# Patient Record
Sex: Male | Born: 1940
Health system: Southern US, Community
[De-identification: ages and names within clinical notes are randomized; demographics above are authoritative.]

## PROBLEM LIST (undated history)

## (undated) DIAGNOSIS — C61 Malignant neoplasm of prostate: Secondary | ICD-10-CM

## (undated) DIAGNOSIS — Z860100 Personal history of colon polyps, unspecified: Secondary | ICD-10-CM

## (undated) DIAGNOSIS — E785 Hyperlipidemia, unspecified: Secondary | ICD-10-CM

## (undated) DIAGNOSIS — M62838 Other muscle spasm: Secondary | ICD-10-CM

## (undated) DIAGNOSIS — K219 Gastro-esophageal reflux disease without esophagitis: Secondary | ICD-10-CM

## (undated) DIAGNOSIS — M1990 Unspecified osteoarthritis, unspecified site: Secondary | ICD-10-CM

## (undated) DIAGNOSIS — C419 Malignant neoplasm of bone and articular cartilage, unspecified: Secondary | ICD-10-CM

## (undated) DIAGNOSIS — M2550 Pain in unspecified joint: Secondary | ICD-10-CM

## (undated) DIAGNOSIS — K2270 Barrett's esophagus without dysplasia: Secondary | ICD-10-CM

## (undated) DIAGNOSIS — H269 Unspecified cataract: Secondary | ICD-10-CM

## (undated) HISTORY — PX: ESOPHAGOGASTRODUODENOSCOPY: SHX1529

## (undated) HISTORY — PX: COLONOSCOPY: SHX174

## (undated) HISTORY — PX: TOTAL HIP ARTHROPLASTY: SHX124

---

## 2000-10-31 ENCOUNTER — Ambulatory Visit (HOSPITAL_COMMUNITY): Admission: RE | Admit: 2000-10-31 | Discharge: 2000-10-31 | Payer: Self-pay | Admitting: *Deleted

## 2000-10-31 ENCOUNTER — Encounter (INDEPENDENT_AMBULATORY_CARE_PROVIDER_SITE_OTHER): Payer: Self-pay | Admitting: *Deleted

## 2004-03-04 ENCOUNTER — Ambulatory Visit (HOSPITAL_COMMUNITY): Admission: RE | Admit: 2004-03-04 | Discharge: 2004-03-04 | Payer: Self-pay | Admitting: *Deleted

## 2004-03-04 ENCOUNTER — Encounter (INDEPENDENT_AMBULATORY_CARE_PROVIDER_SITE_OTHER): Payer: Self-pay | Admitting: Specialist

## 2005-04-26 ENCOUNTER — Ambulatory Visit (HOSPITAL_COMMUNITY): Admission: RE | Admit: 2005-04-26 | Discharge: 2005-04-26 | Payer: Self-pay | Admitting: *Deleted

## 2005-04-26 ENCOUNTER — Encounter (INDEPENDENT_AMBULATORY_CARE_PROVIDER_SITE_OTHER): Payer: Self-pay | Admitting: Specialist

## 2005-12-08 ENCOUNTER — Encounter: Admission: RE | Admit: 2005-12-08 | Discharge: 2005-12-08 | Payer: Self-pay | Admitting: Internal Medicine

## 2006-01-23 ENCOUNTER — Encounter: Admission: RE | Admit: 2006-01-23 | Discharge: 2006-01-23 | Payer: Self-pay | Admitting: Orthopedic Surgery

## 2006-07-27 ENCOUNTER — Encounter: Admission: RE | Admit: 2006-07-27 | Discharge: 2006-07-27 | Payer: Self-pay | Admitting: Internal Medicine

## 2006-12-13 ENCOUNTER — Encounter: Admission: RE | Admit: 2006-12-13 | Discharge: 2006-12-13 | Payer: Self-pay | Admitting: Internal Medicine

## 2006-12-27 ENCOUNTER — Ambulatory Visit: Admission: RE | Admit: 2006-12-27 | Discharge: 2006-12-27 | Payer: Self-pay | Admitting: Internal Medicine

## 2007-03-06 ENCOUNTER — Inpatient Hospital Stay (HOSPITAL_COMMUNITY): Admission: RE | Admit: 2007-03-06 | Discharge: 2007-03-10 | Payer: Self-pay | Admitting: Orthopedic Surgery

## 2007-06-28 ENCOUNTER — Ambulatory Visit (HOSPITAL_COMMUNITY): Admission: RE | Admit: 2007-06-28 | Discharge: 2007-06-28 | Payer: Self-pay | Admitting: *Deleted

## 2007-06-28 ENCOUNTER — Encounter (INDEPENDENT_AMBULATORY_CARE_PROVIDER_SITE_OTHER): Payer: Self-pay | Admitting: *Deleted

## 2008-08-18 ENCOUNTER — Encounter (INDEPENDENT_AMBULATORY_CARE_PROVIDER_SITE_OTHER): Payer: Self-pay | Admitting: *Deleted

## 2008-08-18 ENCOUNTER — Ambulatory Visit (HOSPITAL_COMMUNITY): Admission: RE | Admit: 2008-08-18 | Discharge: 2008-08-18 | Payer: Self-pay | Admitting: *Deleted

## 2009-04-30 ENCOUNTER — Ambulatory Visit (HOSPITAL_COMMUNITY): Admission: RE | Admit: 2009-04-30 | Discharge: 2009-04-30 | Payer: Self-pay | Admitting: *Deleted

## 2009-12-19 HISTORY — PX: KNEE ARTHROSCOPY: SHX127

## 2010-09-09 ENCOUNTER — Emergency Department (HOSPITAL_COMMUNITY): Admission: AC | Admit: 2010-09-09 | Discharge: 2010-09-09 | Payer: Self-pay

## 2011-01-09 ENCOUNTER — Encounter: Payer: Self-pay | Admitting: Internal Medicine

## 2011-03-03 LAB — CBC
Hemoglobin: 16.4 g/dL (ref 13.0–17.0)
MCH: 33.3 pg (ref 26.0–34.0)
MCV: 93.7 fL (ref 78.0–100.0)
Platelets: 274 10*3/uL (ref 150–400)
RBC: 4.92 MIL/uL (ref 4.22–5.81)
WBC: 6.5 10*3/uL (ref 4.0–10.5)

## 2011-03-03 LAB — DIFFERENTIAL
Eosinophils Absolute: 0.1 10*3/uL (ref 0.0–0.7)
Lymphocytes Relative: 29 % (ref 12–46)
Lymphs Abs: 1.9 10*3/uL (ref 0.7–4.0)
Monocytes Relative: 10 % (ref 3–12)
Neutrophils Relative %: 59 % (ref 43–77)

## 2011-03-03 LAB — BASIC METABOLIC PANEL
CO2: 23 meq/L (ref 19–32)
Calcium: 9.8 mg/dL (ref 8.4–10.5)
Chloride: 104 meq/L (ref 96–112)
GFR calc Af Amer: >60 mL/min (ref >60)
Sodium: 137 meq/L (ref 135–145)

## 2011-03-03 LAB — POCT I-STAT, CHEM 8
BUN: 16 mg/dL (ref 6–23)
Chloride: 106 meq/L (ref 96–112)
Creatinine, Ser: 1 mg/dL (ref 0.4–1.5)
Glucose, Bld: 100 mg/dL — ABNORMAL HIGH (ref 70–99)
Hemoglobin: 16.7 g/dL (ref 13.0–17.0)
Potassium: 4.4 meq/L (ref 3.5–5.1)
Sodium: 137 meq/L (ref 135–145)

## 2011-03-03 LAB — GLUCOSE, CAPILLARY

## 2011-05-03 NOTE — Op Note (Signed)
NAMELAWTON, DOLLINGER NO.:  [id number]   MEDICAL RECORD NO.:  [id number]          PATIENT TYPE:  AMB   LOCATION:  ENDO                         FACILITY:  [facility]   PHYSICIAN:  Georgiana Spinner, M.D.    DATE OF BIRTH:  01-10-1941   DATE OF PROCEDURE:  06/28/2007  DATE OF DISCHARGE:                               OPERATIVE REPORT   PROCEDURE:  Upper endoscopy.   INDICATIONS:  Barrett's esophagus.   ANESTHESIA:  Fentanyl 75 mcg, Versed 7 mg.   DESCRIPTION OF PROCEDURE:  With the patient mildly sedated in the left  lateral decubitus position, the Pentax videoscopic endoscope was  inserted in the mouth and passed under direct vision through the  esophagus which appeared normal until we reached the distal esophagus  and there was an area of Barrett's esophagus photographed and biopsied.  We entered into the stomach.  The fundus, body, antrum, duodenal bulb,  and second portion of the duodenum all appeared normal.  From this  point, the endoscope was slowly withdrawn taking circumferential views  of the duodenal mucosa until the endoscope had been pulled back into the  stomach and placed in retroflexion to view the stomach from below. The  endoscope was straightened and withdrawn, taking circumferential views  of the remaining gastric and esophageal mucosa.  [name] vital  signs and pulse oximeter remained stable.  The patient tolerated the  procedure well without apparent complications.   FINDINGS:  Changes of Barrett's esophagus, biopsied, await biopsy  report.  The patient will call me for results and follow up with me as  needed as an outpatient.           ______________________________  Georgiana Spinner, M.D.     GMO/MEDQ  D:  06/28/2007  T:  06/28/2007  Job:  [id number]

## 2011-05-03 NOTE — Op Note (Signed)
NAMELESS, WOOLSEY NO.:  [id number]   MEDICAL RECORD NO.:  [id number]          PATIENT TYPE:  AMB   LOCATION:  ENDO                         FACILITY:  [facility]   PHYSICIAN:  Georgiana Spinner, M.D.    DATE OF BIRTH:  Mar 16, 1941   DATE OF PROCEDURE:  08/18/2008  DATE OF DISCHARGE:                               OPERATIVE REPORT   PROCEDURE:  Upper endoscopy with biopsy.   INDICATIONS:  Barrett's esophagus.   ANESTHESIA:  Fentanyl 50 mcg, Versed 6 mg.   PROCEDURE:  With the patient mildly sedated in the left lateral  decubitus position, the Pentax videoscopic endoscope was inserted in the  mouth, passed under direct vision through the esophagus which appeared  normal on initial view as we entered into the stomach.  Fundus, body,  antrum, duodenal bulb, and second portion duodenum were visualized.  All  appeared normal.  From this point, the endoscope was slowly withdrawn  taking circumferential views of duodenal mucosa until the endoscope had  been pulled back into stomach and placed in retroflexion to view the  stomach from below.  The endoscope was straightened and withdrawn taking  circumferential views of the remaining gastric and esophageal mucosa  stopping in the distal esophagus where an island of Barrett's was  photographed and biopsied.  [name] vital signs and pulse oximeter  remained stable.  The patient tolerated the procedure well without  apparent complications.   FINDINGS:  One [address] of Barrett's esophagus biopsied.  Await  biopsy report.  The patient will call me for results and follow up with  me as an outpatient.           ______________________________  Georgiana Spinner, M.D.     GMO/MEDQ  D:  08/18/2008  T:  08/18/2008  Job:  [id number]

## 2011-05-03 NOTE — Op Note (Signed)
NAMEADREYAN, [name] NO.:  [id number]   MEDICAL RECORD NO.:  [id number]          PATIENT TYPE:  AMB   LOCATION:  ENDO                         FACILITY:  [facility]   PHYSICIAN:  Georgiana Spinner, M.D.    DATE OF BIRTH:  06/04/1941   DATE OF PROCEDURE:  DATE OF DISCHARGE:                               OPERATIVE REPORT   PROCEDURE:  Colonoscopy.   INDICATIONS:  Colon cancer screening for history of colon polyps.   ANESTHESIA:  Fentanyl 60 mcg, Versed 6 mg.   PROCEDURE:  With the patient mildly sedated in the left lateral  decubitus position, a rectal exam was performed which was unremarkable.  Subsequently, the Pentax videoscopic pediatric colonoscope was inserted  in the rectum and passed under direct vision to the cecum, identified by  the ileocecal valve and appendiceal orifice, both of which were  photographed.  From this point, the colonoscope was slowly withdrawn  taking circumferential views of colonic mucosa, stopping only in the  rectum which appeared normal on direct and showed hemorrhoids on  retroflexed view.  The endoscope was straightened and withdrawn.  The  patient's vital signs and pulse oximeter remained stable.  The patient  tolerated the procedure well without apparent complication.   FINDINGS:  Internal hemorrhoids, rare diverticulum sigmoid colon,  otherwise an unremarkable exam.   PLAN:  Consider repeat examination in 5 years.           ______________________________  Georgiana Spinner, M.D.     GMO/MEDQ  D:  04/30/2009  T:  04/30/2009  Job:  [id number]

## 2011-05-06 NOTE — Procedures (Signed)
[place]  Patient:    [name], [name]                    MRN: [id number] Proc. Date: 10/31/00 Adm. Date:  [id number] Attending:  Sabino Gasser                           Procedure Report  PROCEDURE PERFORMED:  Colonoscopy with biopsy.  ENDOSCOPIST:  Sabino Gasser, M.D.  INDICATIONS FOR PROCEDURE:  Colon polyps, family history of colon cancer.  ANESTHESIA:  Demerol 30 mg, Versed 3 mg.  DESCRIPTION OF PROCEDURE:  With the patient mildly sedated in the left lateral decubitus position, the Olympus videoscopic colonoscope was inserted in the rectum after rectal examination was performed and passed under direct vision into the cecum.  The cecum was identified by the ileocecal valve and appendiceal orifice.  We entered into the ileocecal valve and viewed the terminal ileum which also appeared normal and was photographed.  From this point, the colonoscope was slowly withdrawn, taking circumferential views of the entire colonic mucosa stopping only in the rectum which appeared grossly normal on direct view and showed internal hemorrhoids on retroflex.  As we flipped the endoscope out of retroflex view, a polyp was seen just above the anal verge.  This was photographed and then removed using hot biopsy forceps technique at a setting of 20/20 blended current.  Patients vital signs and pulse oximeter remained stable.  The patient tolerated the procedure well and without apparent complications.  FINDINGS:  Polyp in rectum await biopsy report.  Patient will call me for results and follow up with me as an outpatient as needed. DD:  10/31/00 TD:  10/31/00 Job: [id number]

## 2011-05-06 NOTE — Op Note (Signed)
[name], [name]                       ACCOUNT NO.:  [id number]   MEDICAL RECORD NO.:  [id number]                   PATIENT TYPE:  AMB   LOCATION:  ENDO                                 FACILITY:  [facility]   PHYSICIAN:  Georgiana Spinner, M.D.                 DATE OF BIRTH:  [birth year]   DATE OF PROCEDURE:  03/04/2004  DATE OF DISCHARGE:  03/04/2004                                 OPERATIVE REPORT   PROCEDURE:  Colonoscopy.   ENDOSCOPIST:  Georgiana Spinner, M.D.   INDICATIONS:  Colon polyps.   ANESTHESIA:  Demerol 25 mg, Versed 4 mg.   DESCRIPTION OF PROCEDURE:  With the patient mildly sedated in the left  lateral decubitus position, the Olympus videoscopic colonoscope was inserted  in the rectum  after normal rectal exam and passed under direct vision to  the cecum, identified by ileocecal valve and base of the cecum, both of  which were photographed.  Just adjacent to the valve, there was an area of  fold that appeared to be slightly thickened, and I just took a biopsy of it.  I was not sure whether it was adenomatous and actually thought it probably  was not but was perfectly ________ so I biopsied it and then removed the  colonoscope taking circumferential views of the remaining colonic mucosa  stopping only in the rectum which appeared normal on direct and retroflexed  view.  The endoscope was straightened and withdrawn.  [name] vital  signs and pulse oximeter remained stable.  The patient tolerated the  procedure well without apparent complications.   FINDINGS:  Question of a polyp in the cecum.  Otherwise an unremarkable  examination.   PLAN:  We will await biopsy report.  The patient will call me for results  and follow up with me as an outpatient.  See endoscopy note for further  details.  If this tissue is adenomatous, we will repeat colonoscopy with  planned removal.  If not, we will plan on repeat examination in possibly  five years and three years if it proves  to be adenomatous.                                               Georgiana Spinner, M.D.    GMO/MEDQ  D:  03/04/2004  T:  03/06/2004  Job:  [id number]

## 2011-05-06 NOTE — Op Note (Signed)
[name], [name]                       ACCOUNT NO.:  [id number]   MEDICAL RECORD NO.:  [id number]                   PATIENT TYPE:  AMB   LOCATION:  ENDO                                 FACILITY:  [facility]   PHYSICIAN:  Georgiana Spinner, M.D.                 DATE OF BIRTH:  [birth year]   DATE OF PROCEDURE:  03/04/2004  DATE OF DISCHARGE:  03/04/2004                                 OPERATIVE REPORT   PROCEDURE:  Upper endoscopy.   ENDOSCOPIST:  Georgiana Spinner, M.D.   INDICATIONS:  GERD.   ANESTHESIA:  Demerol 75 mg, Versed 4 mg.   DESCRIPTION OF PROCEDURE:  With the patient mildly sedated in the left  lateral decubitus position, the Olympus videoscopic endoscope was inserted  into the mouth, passed under direct vision through the esophagus. There was  a question of Barrett's, photographed and biopsied.  We entered the stomach.  The fundus, body, antrum, duodenal bulb, second portion of the duodenum  appeared normal.  From this point, the endoscope was slowly withdrawn taking  circumferential views of the duodenal mucosa until the endoscope was pulled  back into the stomach, placed in retroflexion to view the stomach from  below.  The endoscope was then straightened and withdrawn taking  circumferential views of the remaining gastric and esophageal mucosa.  The  patient's vital signs and pulse oximeter remained stable.  The patient  tolerated the procedure well without apparent complications.   FINDINGS:  Question of Barrett's esophagus, biopsied.   PLAN:  1. Await biopsy report.  The patient will call me for results and follow up     with me as an outpatient.  2. Proceed to colonoscopy as planned.                                               Georgiana Spinner, M.D.    GMO/MEDQ  D:  03/04/2004  T:  03/06/2004  Job:  [id number]

## 2011-05-06 NOTE — Op Note (Signed)
[name], [name] NO.:  [id number]   MEDICAL RECORD NO.:  [id number]          PATIENT TYPE:  AMB   LOCATION:  ENDO                         FACILITY:  [facility]   PHYSICIAN:  Georgiana Spinner, M.D.    DATE OF BIRTH:  Jan 24, 1941   DATE OF PROCEDURE:  DATE OF DISCHARGE:                                 OPERATIVE REPORT   PROCEDURE:  Upper endoscopy.   INDICATIONS:  GERD.   ANESTHESIA:  Demerol 40 mg, Versed 4 mg.   DESCRIPTION OF PROCEDURE:  With the patient mildly sedated in the left  lateral decubitus position, the Olympus videoscopic endoscope was inserted  into the mouth and passed under direct vision through the esophagus which  appeared normal until we reached the distal esophagus and there appeared to  be once again evidence of Barrett's which was photographed and biopsied.  The endoscope was advanced into the stomach and the fundus, body, antrum,  duodenal bulb, and second portion of the duodenum were visualized.  From  this point, the endoscope was slowly withdrawn, taking circumferential views  of the duodenal mucosa until the endoscope was pulled back into the stomach  and placed in retroflexion.  The endoscope was straightened and withdrawn,  taking circumferential views of the remaining gastric and esophageal mucosa.  [name] vital signs and pulse oximetry remained stable and the patient  tolerated the procedure well without apparent complication.   FINDINGS:  Barrett's esophagus, biopsied.   PLAN:  Await biopsy results, the patient will call for results and follow up  with me as an outpatient.      GMO/MEDQ  D:  04/26/2005  T:  04/26/2005  Job:  [id number]

## 2011-05-06 NOTE — Discharge Summary (Signed)
NAMEXZAVIAN, SEMMEL NO.:  [id number]   MEDICAL RECORD NO.:  [id number]          PATIENT TYPE:  INP   LOCATION:  5021                         FACILITY:  [facility]   PHYSICIAN:  Burnard Bunting, M.D.    DATE OF BIRTH:  [birth year]   DATE OF ADMISSION:  03/06/2007  DATE OF DISCHARGE:  03/10/2007                               DISCHARGE SUMMARY   DISCHARGE DIAGNOSIS:  Right hip arthritis.   SECONDARY DIAGNOSIS:  None.   OPERATIVE PROCEDURES:  Right total hip replacement March 06, 2007.   HOSPITAL COURSE:  [name] is a 70 year old patient with endstage  right hip arthritis.  He underwent right total hip replacement March 06, 2007.  He tolerated the procedure well and without having any  complications.   Postoperatively, leg lengths were approximately equal.  Hemoglobin was  12.  On postop day #1, he was started with mobilization from physical  therapy.  Weightbearing as tolerated, as well as Coumadin for DVT  prophylaxis.  Pain was well controlled on postop day #2.   INR was brought into the therapeutic range by postop day #3.  Incision  was intact.  On postop day #3, dressing was changed at that time.  The  patient had otherwise unremarkable recovery.  He was stable and safe  with PT prior to discharge.  He was discharged home in good condition  March 10, 2007.   DISCHARGE MEDICATIONS:  1. Include Percocet 1 to 2 p.o. q. 3 to 4 hours p.r.n. pain.  2. Robaxin as a muscle relaxor 500 mg p.o. q. 6 to 8 hours p.r.n.      spasms.  3. Coumadin 5 mg p.o. daily to INR 2 to 3.5.  4. Welchol 625 mg daily.  5. Zetia 10 mg p.o. daily.  6. Pravachol 30 mg daily.   He will follow up with me in 7 days for suture removal.      Burnard Bunting, M.D.  Electronically Signed     GSD/MEDQ  D:  04/18/2007  T:  04/18/2007  Job:  [id number]

## 2011-05-06 NOTE — Op Note (Signed)
NAMEDANTHONY, [name] NO.:  [id number]   MEDICAL RECORD NO.:  [id number]          PATIENT TYPE:  INP   LOCATION:  5021                         FACILITY:  [facility]   PHYSICIAN:  Burnard Bunting, M.D.    DATE OF BIRTH:  [birth year]   DATE OF PROCEDURE:  03/06/2007  DATE OF DISCHARGE:                               OPERATIVE REPORT   PREOPERATIVE DIAGNOSES:  Right hip arthritis.   POSTOPERATIVE DIAGNOSES:  Right hip arthritis.   PROCEDURE:  Right total hip arthroplasty using S-ROM press fit  components, 54 acetabular cup, 20 F small proximal sleeve, plus 6 neck,  20 x 15 x 165, 38 standard neck plus a lateralized stem and a 47 ASR  head.   SURGEON:  Burnard Bunting, M.D.   ANESTHESIA:  General endotracheal.   ESTIMATED BLOOD LOSS:  150.   INDICATIONS:  The patient is a 70 year old patient with end-stage right  hip arthritis who presents for right total hip replacement.  He had  failed nonoperative management and after explanation of the risks and  benefits, agrees to proceed.   PROCEDURE IN DETAIL:  The patient was brought to the operating room  where general endotracheal anesthesia was induced and preoperative  antibiotics were administered.  The patient was placed in the lateral  decubitus position with the left axilla and left peroneal nerve well  padded.  [name] right hip, leg and foot were prepped with  DuraPrep solution and draped in a sterile manner.  The skin and  subcutaneous tissue were sharply divided.  The fascia lata was  encountered and divided over the trochanter.  The sciatic nerve was  palpated at this time and protected at all times during the remaining  portion of the case.  The piriformis tendon was tagged and cut and  detached along with the other external rotators.  The capsule was T'd  and marked with #1 Ethibond suture.  The head was dislocated.  The  femoral neck cut was then performed with the reciprocating saw in  accordance with  preoperative templating and intraoperative templating  with a trial prosthesis.  Canal was then reamed in a lateralized  fashion.  The retractors were then placed and the cup was reamed at  approximately 45 degrees abduction and 20 degrees of anteversion, a 54  press-fit cup was placed with excellent fixation achieved.  The final  preparation of the femoral stem was performed with the proximal sleeve  reamer.  Trial prosthesis was placed with anteversion dialed into  position approximately 30-40 degrees off eversion which was  approximately yet 30 degrees of version.  With the trial prosthesis in  place, the patient had full extension and external rotation without any  tearing instability, stability in the position of sleep as well as  stability with 10 degrees of adduction, 90 degrees of hip flexion and 70  degrees of internal rotation.  At this time, trial femoral components  were removed.  True components were placed and stability parameters were  maintained.  Intraoperative x-rays demonstrated proximal and equal leg  length.  The sciatic nerve was  again palpated at the conclusion of the  case.  The split capsule was repaired using a #1 Vicryl suture.  Piriformis tendon was tagged back to the capsule using #1 Vicryl suture.  The fascia lata was closed using interrupted inverted #1 Vicryl suture  followed by interrupted #0 Vicryl suture, 2-0 Vicryl suture and skin  staples and  impervious dressing was placed.  The patient tolerated the procedure  well without immediate complication.  It should be noted that Dr. Gentry Roch assistance for the entire case was required for retraction of  endovascular structures and safe location and dislocation of the femoral  head and prosthesis.      Burnard Bunting, M.D.  Electronically Signed     GSD/MEDQ  D:  03/06/2007  T:  03/06/2007  Job:  [id number]

## 2013-04-17 ENCOUNTER — Other Ambulatory Visit (HOSPITAL_COMMUNITY): Payer: Self-pay | Admitting: Orthopaedic Surgery

## 2013-04-24 ENCOUNTER — Encounter (HOSPITAL_COMMUNITY): Payer: Self-pay | Admitting: Pharmacy Technician

## 2013-04-25 NOTE — Pre-Procedure Instructions (Signed)
DEPAUL ARIZPE  04/25/2013   Your procedure is scheduled on:  May 07, 2013  Report to [facility] at 10:45 AM.  Call this number if you have problems the morning of surgery: [phone]   Remember:   Do not eat food or drink liquids after midnight.   Take these medicines the morning of surgery with A SIP OF WATER: pantoprazole (PROTONIX)    Do not wear jewelry  Do not wear lotions, powders, or perfumes. You may wear deodorant.  Do not shave 48 hours prior to surgery. Men may shave face and neck.  Do not bring valuables to the hospital.  Contacts, dentures or bridgework may not be worn into surgery.  Leave suitcase in the car. After surgery it may be brought to your room.  For patients admitted to the hospital, checkout time is 11:00 AM the day of  discharge.   Patients discharged the day of surgery will not be allowed to drive  home.  Name and phone number of your driver:   Special Instructions: Shower using CHG 2 nights before surgery and the night before surgery.  If you shower the day of surgery use CHG.  Use special wash - you have one bottle of CHG for all showers.  You should use approximately 1/3 of the bottle for each shower.   Please read over the following fact sheets that you were given: Pain Booklet, Coughing and Deep Breathing, Blood Transfusion Information, Total Joint Packet and Surgical Site Infection Prevention

## 2013-04-26 ENCOUNTER — Encounter (HOSPITAL_COMMUNITY)
Admission: RE | Admit: 2013-04-26 | Discharge: 2013-04-26 | Disposition: A | Discharge status: Home / Self Care | Payer: Managed Care, Other (non HMO) | Source: Ambulatory Visit | Attending: Orthopaedic Surgery | Admitting: Orthopaedic Surgery

## 2013-04-26 ENCOUNTER — Encounter (HOSPITAL_COMMUNITY): Payer: Self-pay

## 2013-04-26 HISTORY — DX: Unspecified osteoarthritis, unspecified site: M19.90

## 2013-04-26 HISTORY — DX: Barrett's esophagus without dysplasia: K22.70

## 2013-04-26 HISTORY — DX: Gastro-esophageal reflux disease without esophagitis: K21.9

## 2013-04-26 LAB — BASIC METABOLIC PANEL
BUN: 18 mg/dL (ref 6–23)
CO2: 26 mEq/L (ref 19–32)
Calcium: 10 mg/dL (ref 8.4–10.5)
Chloride: 104 mEq/L (ref 96–112)
Creatinine, Ser: 1.03 mg/dL (ref 0.50–1.35)
GFR calc Af Amer: 83 mL/min — ABNORMAL LOW (ref >90)

## 2013-04-26 LAB — CBC
HCT: 44.5 % (ref 39.0–52.0)
MCHC: 35.1 g/dL (ref 30.0–36.0)
MCV: 91.6 fL (ref 78.0–100.0)
Platelets: 237 10*3/uL (ref 150–400)
RDW: 12.8 % (ref 11.5–15.5)
WBC: 6.3 10*3/uL (ref 4.0–10.5)

## 2013-04-26 LAB — URINALYSIS, ROUTINE W REFLEX MICROSCOPIC
Bilirubin Urine: NEGATIVE
Nitrite: NEGATIVE
Protein, ur: NEGATIVE mg/dL
Specific Gravity, Urine: 1.008 (ref 1.005–1.030)
Urobilinogen, UA: 0.2 mg/dL (ref 0.0–1.0)

## 2013-04-26 LAB — URINE MICROSCOPIC-ADD ON

## 2013-04-26 LAB — SURGICAL PCR SCREEN
MRSA, PCR: NEGATIVE
Staphylococcus aureus: NEGATIVE

## 2013-04-26 LAB — TYPE AND SCREEN: Antibody Screen: NEGATIVE

## 2013-04-29 NOTE — Progress Notes (Signed)
This is a 72 year old male who is scheduled for a left hip total athroplasty to be performed by Dr. Doneen Poisson on 07 May 2013. A pre-operative chest x-ray was performed on 26 Apr 2013. IMPRESSION: No acute abnormality. However, there was a questionable prominent nipple shadow on the left. The recommendation was for a repeat PA view with nipple marker placement to exclude underlying nodule. A repeat CXRwith nipple marker has been ordered for DOS.

## 2013-05-06 MED ORDER — CEFAZOLIN SODIUM-DEXTROSE 2-3 GM-% IV SOLR
2.0000 g | INTRAVENOUS | Status: AC
  Administered 2013-05-07: 2 g via INTRAVENOUS
  Filled 2013-05-06: qty 50

## 2013-05-06 NOTE — Progress Notes (Signed)
Pt notified of time change;to arrive at 1030 

## 2013-05-07 ENCOUNTER — Inpatient Hospital Stay (HOSPITAL_COMMUNITY): Payer: Managed Care, Other (non HMO)

## 2013-05-07 ENCOUNTER — Encounter (HOSPITAL_COMMUNITY): Payer: Self-pay | Admitting: Anesthesiology

## 2013-05-07 ENCOUNTER — Inpatient Hospital Stay (HOSPITAL_COMMUNITY)
Admission: RE | Admit: 2013-05-07 | Discharge: 2013-05-10 | DRG: 470 | Disposition: A | Discharge status: Home / Self Care | Payer: Managed Care, Other (non HMO) | Source: Ambulatory Visit | Attending: Orthopaedic Surgery | Admitting: Orthopaedic Surgery

## 2013-05-07 ENCOUNTER — Encounter (HOSPITAL_COMMUNITY)
Admission: RE | Disposition: A | Discharge status: Home / Self Care | Payer: Self-pay | Source: Ambulatory Visit | Attending: Orthopaedic Surgery

## 2013-05-07 ENCOUNTER — Inpatient Hospital Stay (HOSPITAL_COMMUNITY): Payer: Managed Care, Other (non HMO) | Admitting: Anesthesiology

## 2013-05-07 DIAGNOSIS — M169 Osteoarthritis of hip, unspecified: Secondary | ICD-10-CM

## 2013-05-07 DIAGNOSIS — J449 Chronic obstructive pulmonary disease, unspecified: Secondary | ICD-10-CM | POA: Diagnosis present

## 2013-05-07 DIAGNOSIS — M1610 Unilateral primary osteoarthritis, unspecified hip: Principal | ICD-10-CM | POA: Diagnosis present

## 2013-05-07 DIAGNOSIS — Z01810 Encounter for preprocedural cardiovascular examination: Secondary | ICD-10-CM

## 2013-05-07 DIAGNOSIS — Z96649 Presence of unspecified artificial hip joint: Secondary | ICD-10-CM

## 2013-05-07 DIAGNOSIS — K219 Gastro-esophageal reflux disease without esophagitis: Secondary | ICD-10-CM | POA: Diagnosis present

## 2013-05-07 DIAGNOSIS — Z01818 Encounter for other preprocedural examination: Secondary | ICD-10-CM

## 2013-05-07 DIAGNOSIS — Z01812 Encounter for preprocedural laboratory examination: Secondary | ICD-10-CM

## 2013-05-07 DIAGNOSIS — T84030A Mechanical loosening of internal right hip prosthetic joint, initial encounter: Secondary | ICD-10-CM

## 2013-05-07 DIAGNOSIS — E871 Hypo-osmolality and hyponatremia: Secondary | ICD-10-CM | POA: Diagnosis not present

## 2013-05-07 DIAGNOSIS — J4489 Other specified chronic obstructive pulmonary disease: Secondary | ICD-10-CM | POA: Diagnosis present

## 2013-05-07 HISTORY — PX: TOTAL HIP ARTHROPLASTY: SHX124

## 2013-05-07 SURGERY — ARTHROPLASTY, HIP, TOTAL, ANTERIOR APPROACH
Anesthesia: General | Site: Hip | Laterality: Left | Wound class: Clean

## 2013-05-07 MED ORDER — METOCLOPRAMIDE HCL 10 MG PO TABS: 5.0000 mg | ORAL_TABLET | Freq: Three times a day (TID) | ORAL | Status: DC | PRN

## 2013-05-07 MED ORDER — CEFAZOLIN SODIUM 1-5 GM-% IV SOLN
1.0000 g | Freq: Four times a day (QID) | INTRAVENOUS | Status: AC
  Administered 2013-05-07 – 2013-05-08 (×2): 1 g via INTRAVENOUS
  Filled 2013-05-07 (×2): qty 50

## 2013-05-07 MED ORDER — HYDROMORPHONE HCL PF 1 MG/ML IJ SOLN
INTRAMUSCULAR | Status: DC | PRN
  Administered 2013-05-07: 1 mg via INTRAVENOUS

## 2013-05-07 MED ORDER — ACETAMINOPHEN 10 MG/ML IV SOLN
INTRAVENOUS | Status: AC
  Administered 2013-05-07: 1000 mg via INTRAVENOUS
  Filled 2013-05-07: qty 100

## 2013-05-07 MED ORDER — ACETAMINOPHEN 650 MG RE SUPP: 650.0000 mg | Freq: Four times a day (QID) | RECTAL | Status: DC | PRN

## 2013-05-07 MED ORDER — OXYCODONE HCL 5 MG/5ML PO SOLN: 5.0000 mg | Freq: Once | ORAL | Status: DC | PRN

## 2013-05-07 MED ORDER — LACTATED RINGERS IV SOLN
INTRAVENOUS | Status: DC | PRN
  Administered 2013-05-07 (×3): via INTRAVENOUS

## 2013-05-07 MED ORDER — OXYCODONE HCL 5 MG PO TABS
5.0000 mg | ORAL_TABLET | ORAL | Status: DC | PRN
  Administered 2013-05-08 – 2013-05-10 (×10): 10 mg via ORAL
  Filled 2013-05-07 (×10): qty 2

## 2013-05-07 MED ORDER — DIPHENHYDRAMINE HCL 12.5 MG/5ML PO ELIX: 12.5000 mg | ORAL_SOLUTION | ORAL | Status: DC | PRN

## 2013-05-07 MED ORDER — ACETAMINOPHEN 325 MG PO TABS
650.0000 mg | ORAL_TABLET | Freq: Four times a day (QID) | ORAL | Status: DC | PRN
  Filled 2013-05-07: qty 2

## 2013-05-07 MED ORDER — METOCLOPRAMIDE HCL 5 MG/ML IJ SOLN: 5.0000 mg | Freq: Three times a day (TID) | INTRAMUSCULAR | Status: DC | PRN

## 2013-05-07 MED ORDER — FENTANYL CITRATE 0.05 MG/ML IJ SOLN
INTRAMUSCULAR | Status: DC | PRN
  Administered 2013-05-07: 200 ug via INTRAVENOUS
  Administered 2013-05-07: 250 ug via INTRAVENOUS
  Administered 2013-05-07: 50 ug via INTRAVENOUS

## 2013-05-07 MED ORDER — ONDANSETRON HCL 4 MG/2ML IJ SOLN: 4.0000 mg | Freq: Four times a day (QID) | INTRAMUSCULAR | Status: DC | PRN

## 2013-05-07 MED ORDER — HYDROMORPHONE HCL PF 1 MG/ML IJ SOLN: 1.0000 mg | INTRAMUSCULAR | Status: DC | PRN

## 2013-05-07 MED ORDER — MIDAZOLAM HCL 5 MG/5ML IJ SOLN
INTRAMUSCULAR | Status: DC | PRN
  Administered 2013-05-07: 1 mg via INTRAVENOUS

## 2013-05-07 MED ORDER — ROCURONIUM BROMIDE 100 MG/10ML IV SOLN
INTRAVENOUS | Status: DC | PRN
  Administered 2013-05-07: 40 mg via INTRAVENOUS
  Administered 2013-05-07 (×3): 10 mg via INTRAVENOUS

## 2013-05-07 MED ORDER — MENTHOL 3 MG MT LOZG: 1.0000 | LOZENGE | OROMUCOSAL | Status: DC | PRN

## 2013-05-07 MED ORDER — FENTANYL CITRATE 0.05 MG/ML IJ SOLN
25.0000 ug | INTRAMUSCULAR | Status: DC | PRN
  Administered 2013-05-07: 50 ug via INTRAVENOUS
  Administered 2013-05-07 (×2): 25 ug via INTRAVENOUS

## 2013-05-07 MED ORDER — FENTANYL CITRATE 0.05 MG/ML IJ SOLN
INTRAMUSCULAR | Status: AC
  Filled 2013-05-07: qty 4

## 2013-05-07 MED ORDER — BISACODYL 10 MG RE SUPP: 10.0000 mg | Freq: Every day | RECTAL | Status: DC | PRN

## 2013-05-07 MED ORDER — ONDANSETRON HCL 4 MG/2ML IJ SOLN
INTRAMUSCULAR | Status: DC | PRN
  Administered 2013-05-07: 4 mg via INTRAVENOUS

## 2013-05-07 MED ORDER — METHOCARBAMOL 100 MG/ML IJ SOLN
500.0000 mg | Freq: Four times a day (QID) | INTRAVENOUS | Status: DC | PRN
  Filled 2013-05-07: qty 5

## 2013-05-07 MED ORDER — 0.9 % SODIUM CHLORIDE (POUR BTL) OPTIME
TOPICAL | Status: DC | PRN
  Administered 2013-05-07: 4000 mL

## 2013-05-07 MED ORDER — OXYCODONE HCL ER 10 MG PO T12A
10.0000 mg | EXTENDED_RELEASE_TABLET | Freq: Two times a day (BID) | ORAL | Status: DC
  Administered 2013-05-07 – 2013-05-10 (×5): 10 mg via ORAL
  Filled 2013-05-07 (×5): qty 1

## 2013-05-07 MED ORDER — ONDANSETRON HCL 4 MG PO TABS: 4.0000 mg | ORAL_TABLET | Freq: Four times a day (QID) | ORAL | Status: DC | PRN

## 2013-05-07 MED ORDER — PHENOL 1.4 % MT LIQD: 1.0000 | OROMUCOSAL | Status: DC | PRN

## 2013-05-07 MED ORDER — PROPOFOL 10 MG/ML IV BOLUS
INTRAVENOUS | Status: DC | PRN
  Administered 2013-05-07: 130 mg via INTRAVENOUS

## 2013-05-07 MED ORDER — ALUM & MAG HYDROXIDE-SIMETH 200-200-20 MG/5ML PO SUSP: 30.0000 mL | ORAL | Status: DC | PRN

## 2013-05-07 MED ORDER — NEOSTIGMINE METHYLSULFATE 1 MG/ML IJ SOLN
INTRAMUSCULAR | Status: DC | PRN
  Administered 2013-05-07: 4 mg via INTRAVENOUS

## 2013-05-07 MED ORDER — SODIUM CHLORIDE 0.9 % IV SOLN
INTRAVENOUS | Status: DC
  Administered 2013-05-08: via INTRAVENOUS

## 2013-05-07 MED ORDER — LACTATED RINGERS IV SOLN
INTRAVENOUS | Status: DC
  Administered 2013-05-07: 12:00:00 via INTRAVENOUS

## 2013-05-07 MED ORDER — PANTOPRAZOLE SODIUM 40 MG PO TBEC
40.0000 mg | DELAYED_RELEASE_TABLET | Freq: Every day | ORAL | Status: DC
  Administered 2013-05-07 – 2013-05-10 (×4): 40 mg via ORAL
  Filled 2013-05-07 (×4): qty 1

## 2013-05-07 MED ORDER — ZOLPIDEM TARTRATE 5 MG PO TABS: 5.0000 mg | ORAL_TABLET | Freq: Every evening | ORAL | Status: DC | PRN

## 2013-05-07 MED ORDER — ASPIRIN EC 325 MG PO TBEC
325.0000 mg | DELAYED_RELEASE_TABLET | Freq: Two times a day (BID) | ORAL | Status: DC
  Administered 2013-05-07 – 2013-05-10 (×6): 325 mg via ORAL
  Filled 2013-05-07 (×8): qty 1

## 2013-05-07 MED ORDER — EZETIMIBE 10 MG PO TABS
10.0000 mg | ORAL_TABLET | Freq: Every day | ORAL | Status: DC
  Administered 2013-05-07 – 2013-05-10 (×4): 10 mg via ORAL
  Filled 2013-05-07 (×4): qty 1

## 2013-05-07 MED ORDER — DOCUSATE SODIUM 100 MG PO CAPS
100.0000 mg | ORAL_CAPSULE | Freq: Two times a day (BID) | ORAL | Status: DC
  Administered 2013-05-07 – 2013-05-10 (×6): 100 mg via ORAL
  Filled 2013-05-07 (×7): qty 1

## 2013-05-07 MED ORDER — OXYCODONE HCL 5 MG PO TABS: 5.0000 mg | ORAL_TABLET | Freq: Once | ORAL | Status: DC | PRN

## 2013-05-07 MED ORDER — COLESEVELAM HCL 625 MG PO TABS
1875.0000 mg | ORAL_TABLET | Freq: Every day | ORAL | Status: DC
  Administered 2013-05-07 – 2013-05-10 (×4): 1875 mg via ORAL
  Filled 2013-05-07 (×4): qty 3

## 2013-05-07 MED ORDER — POLYETHYLENE GLYCOL 3350 17 G PO PACK
17.0000 g | PACK | Freq: Every day | ORAL | Status: DC | PRN
  Administered 2013-05-09: 17 g via ORAL
  Filled 2013-05-07: qty 1

## 2013-05-07 MED ORDER — METHOCARBAMOL 500 MG PO TABS
500.0000 mg | ORAL_TABLET | Freq: Four times a day (QID) | ORAL | Status: DC | PRN
  Administered 2013-05-08 – 2013-05-10 (×5): 500 mg via ORAL
  Filled 2013-05-07 (×5): qty 1

## 2013-05-07 MED ORDER — GLYCOPYRROLATE 0.2 MG/ML IJ SOLN
INTRAMUSCULAR | Status: DC | PRN
  Administered 2013-05-07: .4 mg via INTRAVENOUS
  Administered 2013-05-07: .2 mg via INTRAVENOUS

## 2013-05-07 SURGICAL SUPPLY — 49 items
BANDAGE GAUZE ELAST BULKY 4 IN (GAUZE/BANDAGES/DRESSINGS) IMPLANT
BLADE SAW SGTL 18X1.27X75 (BLADE) ×2 IMPLANT
BLADE SURG ROTATE 9660 (MISCELLANEOUS) IMPLANT
CELLS DAT CNTRL 66122 CELL SVR (MISCELLANEOUS) ×1 IMPLANT
CLOTH BEACON ORANGE TIMEOUT ST (SAFETY) ×2 IMPLANT
COVER BACK TABLE 24X17X13 BIG (DRAPES) IMPLANT
COVER SURGICAL LIGHT HANDLE (MISCELLANEOUS) ×2 IMPLANT
DRAPE C-ARM 42X72 X-RAY (DRAPES) ×2 IMPLANT
DRAPE STERI IOBAN 125X83 (DRAPES) ×2 IMPLANT
DRAPE U-SHAPE 47X51 STRL (DRAPES) ×6 IMPLANT
DRSG AQUACEL AG ADV 3.5X10 (GAUZE/BANDAGES/DRESSINGS) ×2 IMPLANT
DURAPREP 26ML APPLICATOR (WOUND CARE) ×2 IMPLANT
ELECT BLADE 4.0 EZ CLEAN MEGAD (MISCELLANEOUS)
ELECT BLADE TIP CTD 4 INCH (ELECTRODE) ×2 IMPLANT
ELECT CAUTERY BLADE 6.4 (BLADE) ×2 IMPLANT
ELECT REM PT RETURN 9FT ADLT (ELECTROSURGICAL) ×2
ELECTRODE BLDE 4.0 EZ CLN MEGD (MISCELLANEOUS) IMPLANT
ELECTRODE REM PT RTRN 9FT ADLT (ELECTROSURGICAL) ×1 IMPLANT
FACESHIELD LNG OPTICON STERILE (SAFETY) ×4 IMPLANT
GAUZE XEROFORM 1X8 LF (GAUZE/BANDAGES/DRESSINGS) ×2 IMPLANT
GLOVE BIOGEL PI IND STRL 8 (GLOVE) ×2 IMPLANT
GLOVE BIOGEL PI INDICATOR 8 (GLOVE) ×2
GLOVE ECLIPSE 8.0 STRL XLNG CF (GLOVE) ×2 IMPLANT
GLOVE SURG ORTHO 8.0 STRL STRW (GLOVE) ×2 IMPLANT
GOWN STRL REIN XL XLG (GOWN DISPOSABLE) ×4 IMPLANT
HANDPIECE INTERPULSE COAX TIP (DISPOSABLE) ×2
KIT BASIN OR (CUSTOM PROCEDURE TRAY) ×2 IMPLANT
KIT ROOM TURNOVER OR (KITS) ×2 IMPLANT
MANIFOLD NEPTUNE II (INSTRUMENTS) ×2 IMPLANT
NS IRRIG 1000ML POUR BTL (IV SOLUTION) ×2 IMPLANT
PACK TOTAL JOINT (CUSTOM PROCEDURE TRAY) ×2 IMPLANT
PAD ARMBOARD 7.5X6 YLW CONV (MISCELLANEOUS) ×4 IMPLANT
RETRACTOR WND ALEXIS 18 MED (MISCELLANEOUS) ×1 IMPLANT
RTRCTR WOUND ALEXIS 18CM MED (MISCELLANEOUS) ×2
SET HNDPC FAN SPRY TIP SCT (DISPOSABLE) ×1 IMPLANT
SPONGE LAP 18X18 X RAY DECT (DISPOSABLE) IMPLANT
SPONGE LAP 4X18 X RAY DECT (DISPOSABLE) IMPLANT
STAPLER VISISTAT 35W (STAPLE) ×2 IMPLANT
SUT ETHIBOND NAB CT1 #1 30IN (SUTURE) ×4 IMPLANT
SUT VIC AB 0 CT1 27 (SUTURE) ×4
SUT VIC AB 0 CT1 27XBRD ANBCTR (SUTURE) ×2 IMPLANT
SUT VIC AB 1 CT1 27 (SUTURE) ×4
SUT VIC AB 1 CT1 27XBRD ANBCTR (SUTURE) ×2 IMPLANT
SUT VIC AB 2-0 CT1 27 (SUTURE) ×4
SUT VIC AB 2-0 CT1 TAPERPNT 27 (SUTURE) ×2 IMPLANT
TOWEL OR 17X24 6PK STRL BLUE (TOWEL DISPOSABLE) ×2 IMPLANT
TOWEL OR 17X26 10 PK STRL BLUE (TOWEL DISPOSABLE) ×2 IMPLANT
TRAY FOLEY CATH 14FR (SET/KITS/TRAYS/PACK) IMPLANT
WATER STERILE IRR 1000ML POUR (IV SOLUTION) ×4 IMPLANT

## 2013-05-07 NOTE — Anesthesia Preprocedure Evaluation (Signed)
Anesthesia Evaluation  Patient identified by MRN, date of birth, ID band Patient awake    Reviewed: Allergy & Precautions, H&P , NPO status , Patient's Chart, lab work & pertinent test results  Airway Mallampati: II  Neck ROM: full    Dental   Pulmonary COPD         Cardiovascular     Neuro/Psych    GI/Hepatic GERD-  ,  Endo/Other    Renal/GU      Musculoskeletal   Abdominal   Peds  Hematology   Anesthesia Other Findings   Reproductive/Obstetrics                           Anesthesia Physical Anesthesia Plan  ASA: II  Anesthesia Plan: General   Post-op Pain Management:    Induction: Intravenous  Airway Management Planned: Oral ETT  Additional Equipment:   Intra-op Plan:   Post-operative Plan: Extubation in OR  Informed Consent: I have reviewed the patients History and Physical, chart, labs and discussed the procedure including the risks, benefits and alternatives for the proposed anesthesia with the patient or authorized representative who has indicated his/her understanding and acceptance.     Plan Discussed with: CRNA, Anesthesiologist and Surgeon  Anesthesia Plan Comments:         Anesthesia Quick Evaluation

## 2013-05-07 NOTE — Progress Notes (Signed)
Moved medications to next shift because medications where not available when scheduled at 1800.

## 2013-05-07 NOTE — Anesthesia Procedure Notes (Signed)
Procedure Name: Intubation Date/Time: 05/07/2013 12:58 PM Performed by: Coralee Rud Pre-anesthesia Checklist: Patient identified, Emergency Drugs available, Suction available, Patient being monitored and Timeout performed Oxygen Delivery Method: Circle system utilized Preoxygenation: Pre-oxygenation with 100% oxygen Intubation Type: IV induction Ventilation: Mask ventilation without difficulty Laryngoscope Size: Miller and 3 Grade View: Grade I Tube type: Oral Tube size: 8.0 mm Number of attempts: 1 Airway Equipment and Method: Stylet and LTA kit utilized Placement Confirmation: ETT inserted through vocal cords under direct vision,  positive ETCO2 and breath sounds checked- equal and bilateral Secured at: 23 cm Tube secured with: Tape Dental Injury: Teeth and Oropharynx as per pre-operative assessment

## 2013-05-07 NOTE — Transfer of Care (Signed)
Immediate Anesthesia Transfer of Care Note  Patient: [name]  Procedure(s) Performed: Procedure(s): LEFT TOTAL HIP ARTHROPLASTY ANTERIOR APPROACH (Left)  Patient Location: PACU  Anesthesia Type:General  Level of Consciousness: awake and alert   Airway & Oxygen Therapy: Patient Spontanous Breathing and Patient connected to face mask oxygen  Post-op Assessment: Report given to PACU RN, Post -op Vital signs reviewed and stable, Patient moving all extremities and Patient moving all extremities X 4  Post vital signs: Reviewed and stable  Complications: No apparent anesthesia complications

## 2013-05-07 NOTE — Brief Op Note (Signed)
05/07/2013  3:56 PM  PATIENT:  [name]  72 y.o. male  PRE-OPERATIVE DIAGNOSIS:  Severe osteoarthritis left hip  POST-OPERATIVE DIAGNOSIS:  Severe osteoarthritis left hip  PROCEDURE:  Procedure(s): LEFT TOTAL HIP ARTHROPLASTY ANTERIOR APPROACH (Left)  SURGEON:  Surgeon(s) and Role:    * Kathryne Hitch, MD - Primary    * Cammy Copa, MD - Assisting   ANESTHESIA:   general  EBL:  Total I/O In: 2000 [I.V.:2000] Out: 200 [Blood:200]  BLOOD ADMINISTERED:none  DRAINS: none   LOCAL MEDICATIONS USED:  NONE  SPECIMEN:  No Specimen  DISPOSITION OF SPECIMEN:  N/A  COUNTS:  YES  TOURNIQUET:  * No tourniquets in log *  DICTATION: .Other Dictation: Dictation Number [id number]  PLAN OF CARE: Admit to inpatient   PATIENT DISPOSITION:  PACU - hemodynamically stable.   Delay start of Pharmacological VTE agent (>24hrs) due to surgical blood loss or risk of bleeding: no

## 2013-05-07 NOTE — H&P (Signed)
TOTAL HIP ADMISSION H&P  Patient is admitted for left total hip arthroplasty.  Subjective:  Chief Complaint: left hip pain  HPI: [name], 72 y.o. male, has a history of pain and functional disability in the left hip(s) due to arthritis and patient has failed non-surgical conservative treatments for greater than 12 weeks to include NSAID's and/or analgesics, corticosteriod injections, use of assistive devices and activity modification.  Onset of symptoms was gradual starting 5 years ago with gradually worsening course since that time.The patient noted no past surgery on the left hip(s).  Patient currently rates pain in the left hip at 8 out of 10 with activity. Patient has night pain, worsening of pain with activity and weight bearing, trendelenberg gait, pain that interfers with activities of daily living, pain with passive range of motion and crepitus. Patient has evidence of subchondral cysts, subchondral sclerosis, periarticular osteophytes and joint space narrowing by imaging studies. This condition presents safety issues increasing the risk of falls.  There is no current active infection.  Patient Active Problem List   Diagnosis Date Noted  . Degenerative arthritis of hip 05/07/2013   Past Medical History  Diagnosis Date  . GERD (gastroesophageal reflux disease)   . Arthritis   . COPD (chronic obstructive pulmonary disease)   . Barrett's esophagus     Past Surgical History  Procedure Laterality Date  . Joint replacement Right 2010  . Knee arthroscopy Left 2011    Prescriptions prior to admission  Medication Sig Dispense Refill  . aspirin EC 81 MG tablet Take 81 mg by mouth daily.      . colesevelam (WELCHOL) 625 MG tablet Take 1,875 mg by mouth daily.      Marland Kitchen ezetimibe (ZETIA) 10 MG tablet Take 10 mg by mouth daily.      . Naproxen Sodium (ALEVE PO) Take 2 tablets by mouth daily as needed (for pain.).      Marland Kitchen pantoprazole (PROTONIX) 40 MG tablet Take 40 mg by mouth daily.        Allergies  Allergen Reactions  . Statins Other (See Comments)    Affects his muscles.    History  Substance Use Topics  . Smoking status: Never Smoker   . Smokeless tobacco: Never Used  . Alcohol Use: No    No family history on file.   Review of Systems  Musculoskeletal: Positive for joint pain.  All other systems reviewed and are negative.    Objective:  Physical Exam  Constitutional: He is oriented to person, place, and time. He appears well-developed and well-nourished.  HENT:  Head: Normocephalic and atraumatic.  Eyes: EOM are normal. Pupils are equal, round, and reactive to light.  Neck: Normal range of motion. Neck supple.  Cardiovascular: Normal rate and regular rhythm.   Respiratory: Effort normal and breath sounds normal.  GI: Soft. Bowel sounds are normal.  Musculoskeletal:       Left hip: He exhibits decreased strength and bony tenderness.  Neurological: He is alert and oriented to person, place, and time.  Skin: Skin is warm and dry.  Psychiatric: He has a normal mood and affect.    Vital signs in last 24 hours: Temp:  [98 F (36.7 C)] 98 F (36.7 C) (05/20 1032) Pulse Rate:  [58] 58 (05/20 1032) Resp:  [18] 18 (05/20 1032) BP: (167)/(87) 167/87 mmHg (05/20 1032) SpO2:  [97 %] 97 % (05/20 1032)  Labs:   There is no weight on file to calculate BMI.  Imaging Review Plain radiographs demonstrate severe degenerative joint disease of the left hip(s). The bone quality appears to be good for age and reported activity level.  Assessment/Plan:  End stage arthritis, left hip(s)  The patient history, physical examination, clinical judgement of the provider and imaging studies are consistent with end stage degenerative joint disease of the left hip(s) and total hip arthroplasty is deemed medically necessary. The treatment options including medical management, injection therapy, arthroscopy and arthroplasty were discussed at length. The risks and  benefits of total hip arthroplasty were presented and reviewed. The risks due to aseptic loosening, infection, stiffness, dislocation/subluxation,  thromboembolic complications and other imponderables were discussed.  The patient acknowledged the explanation, agreed to proceed with the plan and consent was signed. Patient is being admitted for inpatient treatment for surgery, pain control, PT, OT, prophylactic antibiotics, VTE prophylaxis, progressive ambulation and ADL's and discharge planning.The patient is planning to be discharged home with home health services

## 2013-05-08 ENCOUNTER — Encounter (HOSPITAL_COMMUNITY): Payer: Self-pay | Admitting: Orthopaedic Surgery

## 2013-05-08 LAB — BASIC METABOLIC PANEL
BUN: 12 mg/dL (ref 6–23)
CO2: 27 mEq/L (ref 19–32)
Calcium: 8.9 mg/dL (ref 8.4–10.5)
Glucose, Bld: 113 mg/dL — ABNORMAL HIGH (ref 70–99)
Sodium: 134 mEq/L — ABNORMAL LOW (ref 135–145)

## 2013-05-08 LAB — CBC
HCT: 38.7 % — ABNORMAL LOW (ref 39.0–52.0)
Hemoglobin: 13.4 g/dL (ref 13.0–17.0)
MCH: 32.1 pg (ref 26.0–34.0)
MCV: 92.6 fL (ref 78.0–100.0)
RBC: 4.18 MIL/uL — ABNORMAL LOW (ref 4.22–5.81)

## 2013-05-08 NOTE — Evaluation (Signed)
Occupational Therapy Evaluation Patient Details Name: [name] MRN: [id number] DOB: January 20, 1941 Today's Date: 05/08/2013 Time: 2841-3244 OT Time Calculation (min): 17 min  OT Assessment / Plan / Recommendation Clinical Impression  Pt s/p left direct anterior THA .  Pt limited by pain this session after having recently participated in PT.  Will continue to follow acutely to address below problem list in prep for return home.     OT Assessment  Patient needs continued OT Services    Follow Up Recommendations  No OT follow up;Supervision/Assistance - 24 hour    Barriers to Discharge None    Equipment Recommendations  None recommended by OT    Recommendations for Other Services    Frequency  Min 2X/week    Precautions / Restrictions Precautions Precautions: None Restrictions Weight Bearing Restrictions: Yes LLE Weight Bearing: Weight bearing as tolerated   Pertinent Vitals/Pain See vitals    ADL  Eating/Feeding: Performed;Independent Where Assessed - Eating/Feeding: Edge of bed Upper Body Bathing: Simulated;Set up Where Assessed - Upper Body Bathing: Unsupported sitting Lower Body Bathing: Simulated;Minimal assistance Where Assessed - Lower Body Bathing: Unsupported sitting Upper Body Dressing: Simulated;Set up Where Assessed - Upper Body Dressing: Unsupported sitting Lower Body Dressing: Simulated;Moderate assistance Where Assessed - Lower Body Dressing: Unsupported sitting Equipment Used:  (none) Transfers/Ambulation Related to ADLs: Pt came up to sitting EOB but then requested to lay back down due to pain (sat EOB 4 min).  Pt had recently finished PT session. ADL Comments: Pt limited by pain this afternoon. Pt reports he has used AE in past for ADLs when he had R THA.    OT Diagnosis: Generalized weakness;Acute pain  OT Problem List: Decreased strength;Decreased activity tolerance;Decreased knowledge of use of DME or AE;Pain OT Treatment Interventions:  Self-care/ADL training;DME and/or AE instruction;Therapeutic activities;Patient/family education   OT Goals Acute Rehab OT Goals OT Goal Formulation: With patient Time For Goal Achievement: 05/15/13 Potential to Achieve Goals: Good ADL Goals Pt Will Transfer to Toilet: with modified independence;Ambulation;with DME;Comfort height toilet ADL Goal: Toilet Transfer - Progress: Goal set today Pt Will Perform Tub/Shower Transfer: Tub transfer;with supervision;Ambulation;with DME;Shower seat with back ADL Goal: Web designer - Progress: Goal set today Miscellaneous OT Goals Miscellaneous OT Goal #1: Pt will be able to demonstrate use of reacher/sock aid for LB dressing with setup assist. OT Goal: Miscellaneous Goal #1 - Progress: Goal set today Miscellaneous OT Goal #2: Pt will perform bed mobility at supervision level as precursor for EOB ADLs. OT Goal: Miscellaneous Goal #2 - Progress: Goal set today  Visit Information  Last OT Received On: 05/08/13 Assistance Needed: +1    Subjective Data      Prior Functioning     Home Living Lives With: Spouse Available Help at Discharge: Family;Available 24 hours/day Type of Home: House Home Access: Ramped entrance Home Layout: One level Bathroom Shower/Tub: Engineer, manufacturing systems: Handicapped height Bathroom Accessibility: Yes How Accessible: Accessible via walker Home Adaptive Equipment: Walker - rolling;Shower chair with back;Reacher;Bedside commode/3-in-1;Sock aid Prior Function Level of Independence: Independent Able to Take Stairs?: Yes Driving: Yes 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 Communication: No difficulties Dominant Hand: Right         Vision/Perception Vision - History Baseline Vision: No visual deficits   Cognition  Cognition Arousal/Alertness: Awake/alert Behavior During Therapy: WFL for tasks assessed/performed Overall Cognitive Status:  Within Functional Limits for tasks assessed    Extremity/Trunk Assessment Right Upper Extremity Assessment RUE ROM/Strength/Tone: [facility]  for tasks assessed Left Upper Extremity Assessment LUE ROM/Strength/Tone: [facility] for tasks assessed     Mobility Bed Mobility Bed Mobility: Supine to Sit;Sitting - Scoot to [place] of Bed;Sit to Supine Rolling Right: 3: Mod assist Supine to Sit: 4: Min assist;HOB flat Sitting - Scoot to Edge of Bed: 5: Supervision Sit to Supine: 4: Min assist;HOB flat Details for Bed Mobility Assistance: Assist to LLE for support.    Exercise    Balance    End of Session OT - End of Session Activity Tolerance: Patient limited by pain Patient left: in bed;with call bell/phone within reach  GO   05/08/2013 Cipriano Mile OTR/L Pager [phone] Office [phone]   Cipriano Mile 05/08/2013, 4:02 PM

## 2013-05-08 NOTE — Progress Notes (Signed)
UR COMPLETED  

## 2013-05-08 NOTE — Progress Notes (Signed)
Subjective: 1 Day Post-Op Procedure(s) (LRB): LEFT TOTAL HIP ARTHROPLASTY ANTERIOR APPROACH (Left) Patient reports pain as moderate.    Objective: Vital signs in last 24 hours: Temp:  [98 F (36.7 C)-99.9 F (37.7 C)] 99.9 F (37.7 C) (05/21 0532) Pulse Rate:  [47-82] 79 (05/21 0532) Resp:  [10-20] 18 (05/21 0532) BP: (120-167)/(57-103) 129/57 mmHg (05/21 0532) SpO2:  [97 %-100 %] 99 % (05/21 0532)  Intake/Output from previous day: 05/20 0701 - 05/21 0700 In: 2000 [I.V.:2000] Out: 1375 [Urine:1175; Blood:200] Intake/Output this shift:     Recent Labs  05/08/13 0520  HGB 13.4    Recent Labs  05/08/13 0520  WBC 7.5  RBC 4.18*  HCT 38.7*  PLT 199    Recent Labs  05/08/13 0520  NA 134*  K 4.8  CL 100  CO2 27  BUN 12  CREATININE 0.93  GLUCOSE 113*  CALCIUM 8.9   No results found for this basename: LABPT, INR,  in the last 72 hours  Left lower leg: Neurologically intact Sensation intact distally Intact pulses distally Dorsiflexion/Plantar flexion intact Incision: dressing C/D/I  Assessment/Plan: 1 Day Post-Op Procedure(s) (LRB): LEFT TOTAL HIP ARTHROPLASTY ANTERIOR APPROACH (Left) Up with therapy WBAT left leg Mild hyponatremia will monitor  [name] 05/08/2013, 9:02 AM

## 2013-05-08 NOTE — Progress Notes (Signed)
Physical Therapy Treatment Patient Details Name: [name] MRN: [id number] DOB: 28-Jul-1941 Today's Date: 05/08/2013 Time: 1308-6578 PT Time Calculation (min): 29 min  PT Assessment / Plan / Recommendation Comments on Treatment Session  Pt is 72 yo male s/p left THA who is progressing well with mobility. He is having more difficulty with bed mobility than ambulation. PT will continue to follow.    Follow Up Recommendations  Home health PT;Supervision - Intermittent     Does the patient have the potential to tolerate intense rehabilitation     Barriers to Discharge        Equipment Recommendations  None recommended by PT    Recommendations for Other Services    Frequency 7X/week   Plan Discharge plan remains appropriate;Frequency remains appropriate    Precautions / Restrictions Precautions Precautions: None Restrictions Weight Bearing Restrictions: Yes LLE Weight Bearing: Weight bearing as tolerated   Pertinent Vitals/Pain 10/10 pain with bed mobility but otherwise minimal to no pain    Mobility  Bed Mobility Bed Mobility: Rolling Right;Supine to Sit Rolling Right: 3: Mod assist Supine to Sit: 4: Min assist;HOB flat Sitting - Scoot to Edge of Bed: 5: Supervision Details for Bed Mobility Assistance: attempted rolling right away from surgical side but this caused pt 's right leg to spasm and even with mod A to right leg, he could not handle it. Therefore, went to left side of bed with pivot which was much less painful and needed only min A to left leg. Transfers Transfers: Sit to Stand;Stand to Sit Sit to Stand: 5: Supervision;From bed;With upper extremity assist Stand to Sit: 5: Supervision;To bed;With upper extremity assist Details for Transfer Assistance: no physical assist needed, vc's for straight posture once standing Ambulation/Gait Ambulation/Gait Assistance: 5: Supervision Ambulation Distance (Feet): 200 Feet Assistive device: Rolling  walker Ambulation/Gait Assistance Details: vc's for upright posture. Pt has a gait pattern that seems more habitual than due to acute pain and wife confirms this. Educated him on trying to smooth out gait as pain decreases Gait Pattern: Step-through pattern;Decreased stride length;Decreased stance time - left Gait velocity: decreased Stairs: No Wheelchair Mobility Wheelchair Mobility: No    Exercises Total Joint Exercises Ankle Circles/Pumps: AROM;Both;10 reps;Supine Quad Sets: AROM;Both;10 reps;Supine Heel Slides: AROM;Left;10 reps;Supine Hip ABduction/ADduction: AAROM;Left;10 reps;Supine Straight Leg Raises: AAROM;10 reps;Left;Supine   PT Diagnosis:    PT Problem List:   PT Treatment Interventions:     PT Goals Acute Rehab PT Goals PT Goal Formulation: With patient Time For Goal Achievement: 05/15/13 Potential to Achieve Goals: Good Pt will go Supine/Side to Sit: with modified independence PT Goal: Supine/Side to Sit - Progress: Progressing toward goal Pt will go Sit to Supine/Side: with modified independence PT Goal: Sit to Supine/Side - Progress: Progressing toward goal Pt will go Sit to Stand: with modified independence PT Goal: Sit to Stand - Progress: Progressing toward goal Pt will go Stand to Sit: with modified independence PT Goal: Stand to Sit - Progress: Progressing toward goal Pt will Ambulate: with modified independence;>150 feet;with rolling walker PT Goal: Ambulate - Progress: Progressing toward goal Pt will Perform Home Exercise Program: with supervision, verbal cues required/provided PT Goal: Perform Home Exercise Program - Progress: Progressing toward goal  Visit Information  Last PT Received On: 05/08/13 Assistance Needed: +1    Subjective Data  Subjective: pt has slept a lot of the day Patient Stated Goal: return to home and work   Cognition  Cognition Arousal/Alertness: Lethargic Behavior During Therapy: [facility] for tasks  assessed/performed Overall  Cognitive Status: Within Functional Limits for tasks assessed    Balance  Balance Balance Assessed: Yes Static Standing Balance Static Standing - Balance Support: No upper extremity supported;During functional activity Static Standing - Level of Assistance: 6: Modified independent (Device/Increase time)  End of Session PT - End of Session Equipment Utilized During Treatment: Gait belt Activity Tolerance: Patient tolerated treatment well Patient left: in bed;with call bell/phone within reach;with family/visitor present Nurse Communication: Mobility status   GP   Lyanne Co, PT  Acute Rehab Services  [phone]   Lyanne Co 05/08/2013, 2:16 PM

## 2013-05-08 NOTE — Progress Notes (Signed)
Physical Therapy Evaluation Patient Details Name: [name] MRN: [id number] DOB: 1941-07-14 Today's Date: 05/08/2013 Time: 7829-5621 PT Time Calculation (min): 30 min  PT Assessment / Plan / Recommendation Clinical Impression  Pt is 72 yo male s/p left direct anterior THA who is mobilizing well and is familiar with process from having right posterior THA 5 yrs ago. Recommend acute PT to progress mobility and increase strength of LLE for independent d/c home with HHPT.    PT Assessment  Patient needs continued PT services    Follow Up Recommendations  Home health PT;Supervision - Intermittent    Does the patient have the potential to tolerate intense rehabilitation      Barriers to Discharge None      Equipment Recommendations  None recommended by PT    Recommendations for Other Services     Frequency 7X/week    Precautions / Restrictions Precautions Precautions: None Precaution Comments: no true precautions but reviewed anterior approach to warn of anterior muscle weakness Restrictions Weight Bearing Restrictions: Yes LLE Weight Bearing: Weight bearing as tolerated   Pertinent Vitals/Pain 9/10 left hip pain initially but decreased with mobility and ice      Mobility  Bed Mobility Bed Mobility: Supine to Sit;Sitting - Scoot to Edge of Bed Supine to Sit: 4: Min assist;HOB elevated Sitting - Scoot to [place] of Bed: 5: Supervision Details for Bed Mobility Assistance: min A to get legs off bed Transfers Transfers: Sit to Stand;Stand to Sit Sit to Stand: 4: Min guard;From bed;With upper extremity assist Stand to Sit: 4: Min guard;To chair/3-in-1;With upper extremity assist Details for Transfer Assistance: safe hand placement, pt did not need physical assist but vc's for controlling descent into chair and sliding left leg out to decrease pressure on hip during sitting Ambulation/Gait Ambulation/Gait Assistance: 4: Min guard Ambulation Distance (Feet): 75  Feet Assistive device: Rolling walker Ambulation/Gait Assistance Details: vc's for upright posture and looking down hallway instead of down at legs Gait Pattern: Step-through pattern;Decreased stride length;Decreased stance time - left Gait velocity: decreased Stairs: No Wheelchair Mobility Wheelchair Mobility: No    Exercises Total Joint Exercises Ankle Circles/Pumps: AROM;Both;20 reps;Seated Quad Sets: AROM;Both;Seated;10 reps   PT Diagnosis: Difficulty walking;Abnormality of gait;Acute pain  PT Problem List: Decreased strength;Decreased range of motion;Decreased activity tolerance;Decreased mobility;Pain;Decreased knowledge of precautions PT Treatment Interventions: DME instruction;Gait training;Stair training;Functional mobility training;Therapeutic activities;Therapeutic exercise;Balance training;Neuromuscular re-education;Patient/family education   PT Goals Acute Rehab PT Goals PT Goal Formulation: With patient Time For Goal Achievement: 05/15/13 Potential to Achieve Goals: Good Pt will go Supine/Side to Sit: with modified independence PT Goal: Supine/Side to Sit - Progress: Goal set today Pt will go Sit to Supine/Side: with modified independence PT Goal: Sit to Supine/Side - Progress: Goal set today Pt will go Sit to Stand: with modified independence PT Goal: Sit to Stand - Progress: Goal set today Pt will go Stand to Sit: with modified independence PT Goal: Stand to Sit - Progress: Goal set today Pt will Ambulate: with modified independence;>150 feet;with rolling walker PT Goal: Ambulate - Progress: Goal set today Pt will Perform Home Exercise Program: with supervision, verbal cues required/provided PT Goal: Perform Home Exercise Program - Progress: Goal set today  Visit Information  Last PT Received On: 05/08/13 Assistance Needed: +1    Subjective Data  Subjective: I had my first one done 5 yrs ago Patient Stated Goal: return to home and work   Prior Functioning   Home Living Lives With: Spouse Available Help at Discharge:  Family;Available 24 hours/day Type of Home: House Home Access: Ramped entrance Home Layout: One level Bathroom Shower/Tub: Engineer, manufacturing systems: Handicapped height Bathroom Accessibility: Yes How Accessible: Accessible via walker Home Adaptive Equipment: Walker - rolling;Shower chair with back;Reacher Prior Function Level of Independence: Independent Able to Take Stairs?: Yes Driving: Yes 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, retail: No difficulties    Cognition  Cognition Arousal/Alertness: Awake/alert Behavior During Therapy: WFL for tasks assessed/performed Overall Cognitive Status: Within Functional Limits for tasks assessed    Extremity/Trunk Assessment Right Upper Extremity Assessment RUE ROM/Strength/Tone: Within functional levels Left Upper Extremity Assessment LUE ROM/Strength/Tone: Within functional levels Right Lower Extremity Assessment RLE ROM/Strength/Tone: Within functional levels RLE Sensation: WFL - Light Touch;WFL - Proprioception RLE Coordination: WFL - gross motor Left Lower Extremity Assessment LLE ROM/Strength/Tone: Deficits LLE ROM/Strength/Tone Deficits: hip flex 2-/5, quad 3/5, ankle WFL LLE Sensation: WFL - Light Touch Trunk Assessment Trunk Assessment: Normal   Balance Balance Balance Assessed: No  End of Session PT - End of Session Equipment Utilized During Treatment: Gait belt Activity Tolerance: Patient tolerated treatment well Patient left: in chair;with call bell/phone within reach;with family/visitor present Nurse Communication: Mobility status  GP   Lyanne Co, PT  Acute Rehab Services  [phone]   Lyanne Co 05/08/2013, 8:56 AM

## 2013-05-08 NOTE — Anesthesia Postprocedure Evaluation (Signed)
Anesthesia Post Note  Patient: [name]  Procedure(s) Performed: Procedure(s) (LRB): LEFT TOTAL HIP ARTHROPLASTY ANTERIOR APPROACH (Left)  Anesthesia type: General  Patient location: PACU  Post pain: Pain level controlled and Adequate analgesia  Post assessment: Post-op Vital signs reviewed, Patient's Cardiovascular Status Stable, Respiratory Function Stable, Patent Airway and Pain level controlled  Last Vitals:  Filed Vitals:   05/08/13 0532  BP: 129/57  Pulse: 79  Temp: 37.7 C  Resp: 18    Post vital signs: Reviewed and stable  Level of consciousness: awake, alert  and oriented  Complications: No apparent anesthesia complications

## 2013-05-08 NOTE — Op Note (Signed)
[name], [name] NO.:  [id number]  MEDICAL RECORD NO.:  [id number]  LOCATION:  [id number]                        FACILITY:  [facility]  PHYSICIAN:  Vanita Panda. Magnus Ivan, M.D.DATE OF BIRTH:  03/02/1941  DATE OF PROCEDURE:  05/07/2013 DATE OF DISCHARGE:                              OPERATIVE REPORT   PREOPERATIVE DIAGNOSIS:  Severe end-stage arthritis and degenerative disease, left hip.  POSTOPERATIVE DIAGNOSIS:  Severe end-stage arthritis and degenerative disease, left hip.  PROCEDURE:  Left total hip arthroplasty through direct anterior approach.  IMPLANTS:  DePuy Sector Gription acetabular component size 54, size 36+4 neutral polyethylene liner, size 13 Corail femoral component with standard offset, size 36+1.5 ceramic hip ball.  SURGEON:  Vanita Panda. Magnus Ivan, M.D.  ASSISTANT: 1. Burnard Bunting, MD 2. Richardean Canal, PA-C.  ANESTHESIA:  General.  ANTIBIOTICS:  Ancef 2 g IV.  ESTIMATED BLOOD LOSS:  100-200 mL.  COMPLICATIONS:  None.  INDICATIONS:  [name] is a gentleman who has severe bilateral hip disease and for a long period time he has already had a right total hip arthroplasty.  With the failure of multiple injections, daily pain, and decrease in activities of daily living and poor mobility due to the point he wishes to proceed with a total hip arthroplasty on the left side.  He understands fully the risks and benefits of the surgery and does wish to proceed.  PROCEDURES IN DETAIL:  After informed consent was obtained, appropriate left hip was marked.  He was brought to the operating room and  general anesthesia was obtained while he was on the stretcher.  Traction boots were placed on his feet and he was next placed supine on the Hana fracture table.  Perineal post was placed.  Both legs were placed in inline skeletal traction devices, but no traction applied.  We then assessed the left and right hips under direct fluoroscopy, so we  could obtain some good images before surgery assessing his leg lengths and center of the pelvis.  Of note, preoperatively he did start off with a short hip on the left side.  We then backed up to see him in the fluoroscopic unit.  Prepped the left hip with DuraPrep and sterile drapes.  Time-out was called to identify correct patient, correct left hip.  I then made an incision just inferior and posterior to the anterosuperior iliac spine and dissected down to the tensor fascia lata muscle.  The tensor fascia was then divided longitudinally and I proceeded with a direct anterior approach to the hip.  A Cobra retractor was placed around the lateral neck and up underneath the rectus femoris, a medial retractor was placed.  I cauterized the lateral femoral circumflex vessels, and then I opened up the hip capsule in L-type format, and finding a large effusion.  I put the Cobra retractors within the hip capsule.  I then made my femoral neck cut just proximal to the lesser trochanter with an oscillating saw and completed this on osteotome.  I placed a corkscrew guide in the femoral head and removed femoral head its entirety, finding it devoid of cartilage.  I then cleaned the acetabular debris, placed a bent Hohmann medially and  a Cobra retractor laterally.  I began reaming from size 42 reamer and even increments all the way up to 54 with all reamers placed under direct visualization and last reamer also placed under direct fluoroscopy.  So, I could obtain my depth of reaming, my inclination, and anteversion. Once I was pleased with that, I placed the real DePuy Sector Gription acetabular component size 54, and under direct fluoroscopy and visualization.  When I was pleased with the placement of this, I placed the apex hole eliminator guide a 36+4 neutral polyethylene liner. Attention was then turned to the femur.  With all traction off the leg, the leg was extended and adducted as well as  externally rotated to 95 degrees.  A Mueller retractor was placed medially and a Cobra retractor behind the greater trochanter.  I released the lateral joint capsule and then used a box cutting guide and a rongeur to open up the femoral canal and lateralize as well.  I then began broaching from a size 8 broach from Corail system up to a size 13.  I felt this was stable so we trialed a standard neck and a 36+1.5 hip ball.  We brought the leg back over and up with traction internal rotation reduced in the acetabulum. It was stable with internal and external rotation.  Minimal shock and leg lengths were measured equal.  I then dislocated the hip and removed the trial components.  I placed the real Corail femoral component with standard offset and the real 36+1.5 ceramic hip ball reduced this back in the acetabulum and again it was stable.  We copiously irrigated the soft tissues with normal saline solution using pulsatile lavage.  We closed the joint capsule with interrupted #1 Ethibond suture followed by a running #1 Vicryl in the tensor fascia, 0 Vicryl in the deep tissue, 2- 0 Vicryl in subcutaneous tissue, 4-0 Monocryl in subcuticular stitch. Dermabond and a Aquacel dressing.  Once this was completed, the patient was taken off the Hana table, awakened, extubated, and taken to the recovery room in stable condition.  All final counts were correct. There were no complications noted.  Of note, Of note, [name], PA-C and Dr. August Saucer were present throughout the entire case and their presence was crucial in getting the case.     Vanita Panda. Magnus Ivan, M.D.     CYB/MEDQ  D:  05/07/2013  T:  05/08/2013  Job:  [id number]

## 2013-05-09 ENCOUNTER — Encounter (HOSPITAL_COMMUNITY): Payer: Self-pay | Admitting: General Practice

## 2013-05-09 LAB — CBC
Hemoglobin: 12.3 g/dL — ABNORMAL LOW (ref 13.0–17.0)
MCH: 32 pg (ref 26.0–34.0)
MCV: 92.7 fL (ref 78.0–100.0)
Platelets: 185 10*3/uL (ref 150–400)
RBC: 3.84 MIL/uL — ABNORMAL LOW (ref 4.22–5.81)

## 2013-05-09 MED ORDER — ASPIRIN 325 MG PO TBEC: 325.0000 mg | DELAYED_RELEASE_TABLET | Freq: Two times a day (BID) | ORAL | Status: DC

## 2013-05-09 MED ORDER — METHOCARBAMOL 500 MG PO TABS: 500.0000 mg | ORAL_TABLET | Freq: Four times a day (QID) | ORAL | Status: DC | PRN

## 2013-05-09 MED ORDER — OXYCODONE-ACETAMINOPHEN 5-325 MG PO TABS: 1.0000 | ORAL_TABLET | ORAL | Status: DC | PRN

## 2013-05-09 NOTE — Care Management Note (Signed)
    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 Patient has DME at home set up perioperatively with TNT.     Action/Plan:   Anticipated DC Date:  05/09/2013   Anticipated DC Plan:  HOME W HOME HEALTH SERVICES      DC Planning Services  CM consult      [facility] Choice  HOME HEALTH   Choice offered to / List presented to:  C-1 Patient   DME arranged  BEDSIDE COMMODE      DME agency  [facility]     HH arranged  HH-2 PT      [facility] agency  [facility]   Status of service:  Completed, signed off Medicare Important Message given?   (If response is "NO", the following Medicare IM given date fields will be blank) Date Medicare IM given:   Date Additional Medicare IM given:    Discharge Disposition:  HOME W HOME HEALTH SERVICES  Per UR Regulation:  Reviewed for med. necessity/level of care/duration of stay  If discussed at Long Length of Stay Meetings, dates discussed:    Comments:  05/09/13 12:15 Letha Cape RN, BSN [phone] patient lives with spouse, patient has  rolling walker, cane and crutches at home, he will need a bsc, order in for this.  Patient for possible dc today or tomorrow.  Patient chose [facility] for HHP[facility], referral made to [facility], Marie notified. Soc will begin 24-48 hrs post discharge.

## 2013-05-09 NOTE — Progress Notes (Signed)
Physical Therapy Treatment Patient Details Name: [name] MRN: [id number] DOB: [birth year] Today's Date: 05/09/2013 Time: 1914-7829 PT Time Calculation (min): 17 min  PT Assessment / Plan / Recommendation Comments on Treatment Session  Pt is making excellent progress with therpay. Anticipate pt will be ready for d/c home tomorrw after therapy if medically cleared.     Follow Up Recommendations  Home health PT     Does the patient have the potential to tolerate intense rehabilitation     Barriers to Discharge        Equipment Recommendations  None recommended by PT    Recommendations for Other Services    Frequency     Plan Discharge plan remains appropriate    Precautions / Restrictions Restrictions Weight Bearing Restrictions: Yes LLE Weight Bearing: Weight bearing as tolerated   Pertinent Vitals/Pain     Mobility  Bed Mobility Bed Mobility: Supine to Sit Rolling Right: 4: Min assist Sitting - Scoot to Edge of Bed: 5: Supervision Details for Bed Mobility Assistance: cues for technique Transfers Transfers: Sit to Stand;Stand to Sit Sit to Stand: 5: Supervision Stand to Sit: 5: Supervision Details for Transfer Assistance: cues for hand placement and increased time Ambulation/Gait Ambulation/Gait Assistance: 5: Supervision Ambulation Distance (Feet): 160 Feet Assistive device: Rolling walker Gait Pattern: Step-through pattern;Decreased stride length Gait velocity: decreased    Exercises Total Joint Exercises Ankle Circles/Pumps: AROM;Strengthening;Both;10 reps;Supine Quad Sets: AROM;Strengthening;Both;10 reps;Supine Long Arc Quad: AAROM;Strengthening;Left;10 reps;Seated   PT Diagnosis:    PT Problem List:   PT Treatment Interventions:     PT Goals Acute Rehab PT Goals PT Goal: Supine/Side to Sit - Progress: Progressing toward goal PT Goal: Sit to Stand - Progress: Progressing toward goal PT Goal: Stand to Sit - Progress: Progressing toward goal PT  Goal: Ambulate - Progress: Progressing toward goal PT Goal: Perform Home Exercise Program - Progress: Progressing toward goal  Visit Information  Last PT Received On: 05/09/13 Assistance Needed: +1    Subjective Data  Subjective: My leg is sore   Cognition  Cognition Arousal/Alertness: Awake/alert Behavior During Therapy: WFL for tasks assessed/performed Overall Cognitive Status: Within Functional Limits for tasks assessed    Balance  Static Standing Balance Static Standing - Balance Support: Bilateral upper extremity supported Static Standing - Level of Assistance: 5: Stand by assistance  End of Session PT - End of Session Equipment Utilized During Treatment: Gait belt Activity Tolerance: Patient tolerated treatment well Patient left: in chair;with call bell/phone within reach;with family/visitor present Nurse Communication: Mobility status   GP     [name] 05/09/2013, 11:02 AM

## 2013-05-09 NOTE — Progress Notes (Signed)
Subjective: 2 Days Post-Op Procedure(s) (LRB): LEFT TOTAL HIP ARTHROPLASTY ANTERIOR APPROACH (Left) Patient reports pain as moderate.  Cramp with therapy yesterday. No SOB, CP or dizziness.  Objective: Vital signs in last 24 hours: Temp:  [98.9 F (37.2 C)-100.4 F (38 C)] 99.2 F (37.3 C) (05/22 0542) Pulse Rate:  [73-86] 74 (05/22 0542) Resp:  [16] 16 (05/22 0542) BP: (114-124)/(57-75) 114/58 mmHg (05/22 0542) SpO2:  [95 %-99 %] 95 % (05/22 0542)  Intake/Output from previous day:   Intake/Output this shift:     Recent Labs  05/08/13 0520 05/09/13 0500  HGB 13.4 12.3*    Recent Labs  05/08/13 0520 05/09/13 0500  WBC 7.5 8.8  RBC 4.18* 3.84*  HCT 38.7* 35.6*  PLT 199 185    Recent Labs  05/08/13 0520  NA 134*  K 4.8  CL 100  CO2 27  BUN 12  CREATININE 0.93  GLUCOSE 113*  CALCIUM 8.9   No results found for this basename: LABPT, INR,  in the last 72 hours  Neurovascular intact Intact pulses distally Dorsiflexion/Plantar flexion intact Incision: dressing C/D/I  Assessment/Plan: 2 Days Post-Op Procedure(s) (LRB): LEFT TOTAL HIP ARTHROPLASTY ANTERIOR APPROACH (Left) Up with therapy Probable d/c tomorrow if does well with PT today   Richardean Canal 05/09/2013, 7:53 AM

## 2013-05-09 NOTE — Progress Notes (Signed)
Occupational Therapy Treatment Patient Details Name: [name] MRN: [id number] DOB: 1941-09-01 Today's Date: 05/09/2013 Time: 7829-5621 OT Time Calculation (min): 33 min  OT Assessment / Plan / Recommendation Comments on Treatment Session Pt progressing towards goals. Practiced LB dressing with AE. Will attempt to practice tub transfer tomorrow.     Follow Up Recommendations  No OT follow up;Supervision/Assistance - 24 hour    Barriers to Discharge       Equipment Recommendations  None recommended by OT    Recommendations for Other Services    Frequency Min 2X/week   Plan Discharge plan remains appropriate    Precautions / Restrictions Precautions Precautions: None Restrictions Weight Bearing Restrictions: Yes LLE Weight Bearing: Weight bearing as tolerated   Pertinent Vitals/Pain Pain 9/10 in hip with ambulation. Repositioned.     ADL  Grooming: Performed;Wash/dry hands;Min guard Where Assessed - Grooming: Unsupported standing Lower Body Dressing: Performed;Minimal assistance Where Assessed - Lower Body Dressing: Unsupported sitting;Unsupported sit to stand (socks-sitting and underwear-sit to stand) Toilet Transfer: Simulated;Minimal assistance Toilet Transfer Method: Sit to Barista: Other (comment) (from recliner chair) Toileting - Clothing Manipulation and Hygiene: Min guard Where Assessed - Toileting Clothing Manipulation and Hygiene: Standing Equipment Used: Gait belt;Rolling walker Transfers/Ambulation Related to ADLs: Minguard for ambulation. Min A for transfers. ADL Comments: Pt practiced using sockaid and reacher to don/doff socks and don underwear. Pt at minguard level to don underwear with supported sit to stand transfer. Pt requiring very minimal assistance to pull sock all the way up and straigthen it out. Cues required for correct use of sockaid.    OT Diagnosis:    OT Problem List:   OT Treatment Interventions:     OT  Goals Acute Rehab OT Goals OT Goal Formulation: With patient Time For Goal Achievement: 05/15/13 Potential to Achieve Goals: Good ADL Goals Pt Will Transfer to Toilet: with modified independence;Ambulation;with DME;Comfort height toilet ADL Goal: Toilet Transfer - Progress: Progressing toward goals Pt Will Perform Tub/Shower Transfer: Tub transfer;with supervision;Ambulation;with DME;Shower seat with back Miscellaneous OT Goals Miscellaneous OT Goal #1: Pt will be able to demonstrate use of reacher/sock aid for LB dressing with setup assist. OT Goal: Miscellaneous Goal #1 - Progress: Progressing toward goals Miscellaneous OT Goal #2: Pt will perform bed mobility at supervision level as precursor for EOB ADLs. OT Goal: Miscellaneous Goal #2 - Progress: Progressing toward goals  Visit Information  Last OT Received On: 05/09/13 Assistance Needed: +1    Subjective Data      Prior Functioning       Cognition  Cognition Arousal/Alertness: Awake/alert Behavior During Therapy: WFL for tasks assessed/performed Overall Cognitive Status: Within Functional Limits for tasks assessed    Mobility  Bed Mobility Bed Mobility: Supine to Sit;Sitting - Scoot to [place] of Bed;Sit to Supine;Scooting to [facility] Supine to Sit: 5: Supervision;With rails;HOB flat Sitting - Scoot to Edge of Bed: 5: Supervision Sit to Supine: 4: Min assist;HOB flat Scooting to [facility]: 5: Supervision;With rail Details for Bed Mobility Assistance: Min A to assist LLE in bed when going from sit to supine.  Transfers Transfers: Sit to Stand;Stand to Sit Sit to Stand: 4: Min assist;With upper extremity assist;From bed;From chair/3-in-1 Stand to Sit: 4: Min assist;With upper extremity assist;To chair/3-in-1;4: Min guard;To bed Details for Transfer Assistance: Min A for transfers. Pt at Min A to control decent to chair. Pt at Minguard level on last stand to sit transfer to bed with much better controlled decent. Cues for positioning  of LLE when sitting to decrease pain.          End of Session OT - End of Session Equipment Utilized During Treatment: Gait belt Activity Tolerance: Patient tolerated treatment well Patient left: in bed;with call bell/phone within reach  GO     Earlie Raveling OTR/L [phone] 05/09/2013, 3:25 PM

## 2013-05-09 NOTE — Progress Notes (Signed)
Physical Therapy Treatment Patient Details Name: [name] MRN: [id number] DOB: 1941-09-08 Today's Date: 05/09/2013 Time: 1207-1232 PT Time Calculation (min): 25 min  PT Assessment / Plan / Recommendation Comments on Treatment Session  Pt is doing well with therapy. Expected d/c home tomorrow after therapy with HHPT services.    Follow Up Recommendations  Home health PT     Does the patient have the potential to tolerate intense rehabilitation     Barriers to Discharge        Equipment Recommendations  None recommended by PT    Recommendations for Other Services    Frequency 7X/week   Plan Discharge plan remains appropriate    Precautions / Restrictions Restrictions Weight Bearing Restrictions: Yes LLE Weight Bearing: Weight bearing as tolerated   Pertinent Vitals/Pain     Mobility  Bed Mobility Bed Mobility: Sit to Supine Rolling Right: 4: Min assist Sitting - Scoot to Edge of Bed: 5: Supervision Sit to Supine: 4: Min assist;HOB flat Details for Bed Mobility Assistance: cues for technique and assistance with LLE management Transfers Transfers: Sit to Stand;Stand to Sit Sit to Stand: 5: Supervision;From chair/3-in-1;From toilet Stand to Sit: 5: Supervision;To toilet;To bed Details for Transfer Assistance: slow with transfers and increased effort, cues for technique Ambulation/Gait Ambulation/Gait Assistance: 5: Supervision Ambulation Distance (Feet): 115 Feet Assistive device: Rolling walker Ambulation/Gait Assistance Details: cues for RW placement to allow for a step through pattern with increasing stide length. Gait Pattern: Step-through pattern;Decreased stride length Gait velocity: decreased Stairs: No    Exercises Total Joint Exercises Ankle Circles/Pumps: AROM;Strengthening;Both;10 reps;Supine Quad Sets: AROM;Strengthening;Both;10 reps;Supine Long Arc Quad: AAROM;Strengthening;Left;10 reps;Seated   PT Diagnosis:    PT Problem List:   PT  Treatment Interventions:     PT Goals Acute Rehab PT Goals PT Goal: Supine/Side to Sit - Progress: Progressing toward goal PT Goal: Sit to Supine/Side - Progress: Progressing toward goal PT Goal: Sit to Stand - Progress: Progressing toward goal PT Goal: Stand to Sit - Progress: Progressing toward goal PT Goal: Ambulate - Progress: Progressing toward goal PT Goal: Perform Home Exercise Program - Progress: Progressing toward goal  Visit Information  Last PT Received On: 05/09/13 Assistance Needed: +1    Subjective Data  Subjective: I am not ready to go home today.   Cognition  Cognition Arousal/Alertness: Awake/alert Behavior During Therapy: WFL for tasks assessed/performed Overall Cognitive Status: Within Functional Limits for tasks assessed    Balance  Static Standing Balance Static Standing - Balance Support: Bilateral upper extremity supported Static Standing - Level of Assistance: 5: Stand by assistance  End of Session PT - End of Session Equipment Utilized During Treatment: Gait belt Activity Tolerance: Patient tolerated treatment well Patient left: in bed;with call bell/phone within reach;with family/visitor present Nurse Communication: Mobility status   GP     [name] 05/09/2013, 1:14 PM

## 2013-05-10 LAB — CBC
HCT: 37.2 % — ABNORMAL LOW (ref 39.0–52.0)
MCHC: 34.4 g/dL (ref 30.0–36.0)
MCV: 93.5 fL (ref 78.0–100.0)
RDW: 12.8 % (ref 11.5–15.5)

## 2013-05-10 NOTE — Discharge Summary (Signed)
Patient ID: [name] MRN: [id number] DOB/AGE: 04-25-1941 72 y.o.  Admit date: 05/07/2013 Discharge date: 05/10/2013  Admission Diagnoses:  Principal Problem:   Degenerative arthritis of hip   Discharge Diagnoses:  Same  Past Medical History  Diagnosis Date  . GERD (gastroesophageal reflux disease)   . Arthritis   . COPD (chronic obstructive pulmonary disease)   . Barrett's esophagus     Surgeries: Procedure(s): LEFT TOTAL HIP ARTHROPLASTY ANTERIOR APPROACH on 05/07/2013   Consultants:    Discharged Condition: Improved  Hospital Course: [name] is an 72 y.o. male who was admitted 05/07/2013 for operative treatment ofDegenerative arthritis of hip. Patient has severe unremitting pain that affects sleep, daily activities, and work/hobbies. After pre-op clearance the patient was taken to the operating room on 05/07/2013 and underwent  Procedure(s): LEFT TOTAL HIP ARTHROPLASTY ANTERIOR APPROACH.    Patient was given perioperative antibiotics: Anti-infectives   Start     Dose/Rate Route Frequency Ordered Stop   05/07/13 1910  ceFAZolin (ANCEF) IVPB 1 g/50 mL premix     1 g 100 mL/hr over 30 Minutes Intravenous Every 6 hours 05/07/13 1749 05/08/13 0205   05/07/13 0600  ceFAZolin (ANCEF) IVPB 2 g/50 mL premix     2 g 100 mL/hr over 30 Minutes Intravenous On call to O.R. 05/06/13 1402 05/07/13 1310       Patient was given sequential compression devices, early ambulation, and chemoprophylaxis to prevent DVT.  Patient benefited maximally from hospital stay and there were no complications.    Recent vital signs: Patient Vitals for the past 24 hrs:  BP Temp Temp src Pulse Resp SpO2  05/10/13 0600 131/69 mmHg 98.6 F (37 C) Oral 86 18 98 %  05/09/13 2126 118/63 mmHg 98.8 F (37.1 C) Oral 89 18 97 %  05/09/13 1600 - - - - 18 -  05/09/13 1200 - - - - 18 -  05/09/13 0800 - - - - 18 -     Recent laboratory studies:  Recent Labs  05/08/13 0520 05/09/13 0500  05/10/13 0545  WBC 7.5 8.8 8.3  HGB 13.4 12.3* 12.8*  HCT 38.7* 35.6* 37.2*  PLT 199 185 196  NA 134*  --   --   K 4.8  --   --   CL 100  --   --   CO2 27  --   --   BUN 12  --   --   CREATININE 0.93  --   --   GLUCOSE 113*  --   --   CALCIUM 8.9  --   --      Discharge Medications:     Medication List    STOP taking these medications       ALEVE PO      TAKE these medications       aspirin 325 MG EC tablet  Take 1 tablet (325 mg total) by mouth 2 (two) times daily.     colesevelam 625 MG tablet  Commonly known as:  WELCHOL  Take 1,875 mg by mouth daily.     ezetimibe 10 MG tablet  Commonly known as:  ZETIA  Take 10 mg by mouth daily.     methocarbamol 500 MG tablet  Commonly known as:  ROBAXIN  Take 1 tablet (500 mg total) by mouth every 6 (six) hours as needed.     oxyCODONE-acetaminophen 5-325 MG per tablet  Commonly known as:  ROXICET  Take 1-2 tablets by mouth every 4 (  four) hours as needed for pain.     pantoprazole 40 MG tablet  Commonly known as:  PROTONIX  Take 40 mg by mouth daily.        Diagnostic Studies: X-ray Chest Pa Or Ap  05/07/2013   *RADIOLOGY REPORT*  Clinical Data: Possible lung nodule  CHEST - 1 VIEW  Comparison: 04/26/2013  Findings: Cardiomediastinal silhouette is stable.  No acute infiltrate or pulmonary edema.  Bilateral nipple markers are noted. No lung nodule is noted.  Nodular density on the prior study corresponds to the nipple.  IMPRESSION: No acute infiltrate or pulmonary edema.  No lung nodule.  Bilateral nipple markers.   Original Report Authenticated By: [name], M.D.   Dg Chest 2 View  04/26/2013   *RADIOLOGY REPORT*  Clinical Data: Preop hip surgery.  Nonsmoker.  CHEST - 2 VIEW  Comparison: 03/02/2007.  Findings: No infiltrate, congestive heart failure or pneumothorax.  Apical pleural thickening without associated bony destruction and without significant change.  Question prominent nipple shadow on the left.  Repeat PA  view with nipple marker placed recommended to exclude underlying nodule.  Heart size within normal limits.  Mild degenerative changes thoracic spine.  IMPRESSION: No acute abnormality.  Question prominent nipple shadow on the left.  Repeat PA view with nipple marker placed recommended to exclude underlying nodule.  This is a call report.   Original Report Authenticated By: [name], M.D.   Dg Hip Operative Left  05/07/2013   *RADIOLOGY REPORT*  Clinical Data: Left total hip arthroplasty, anterior approach.  OPERATIVE LEFT HIP  Comparison: Pelvic radiographs 03/06/2007.  Findings: Two spot fluoroscopic images of the lower pelvis and left hip demonstrate interval left total hip arthroplasty.  Hardware appears well positioned.  There is no evidence of acute fracture or dislocation.  Previous right total hip arthroplasty is partially imaged.  IMPRESSION: Intraoperative views following left total hip arthroplasty.  No demonstrated complication.   Original Report Authenticated By: [name], M.D.   Dg Pelvis Portable  05/07/2013   *RADIOLOGY REPORT*  Clinical Data: Left lobe arthroplasty  PORTABLE PELVIS  Comparison: None.  Findings: Left total hip arthroplasty.  Femoral component acetabular component appear well seated.  Right lobe arthroplasty noted  IMPRESSION: Left hip total arthroplasty without complication.   Original Report Authenticated By: [name], M.D.   Dg Hip Portable 1 View Left  05/07/2013   *RADIOLOGY REPORT*  Clinical Data: Postop  PORTABLE LEFT HIP - 1 VIEW  Comparison: 05/07/2013  Findings: Interval left hip total arthroplasty.  No evidence of fracture.  IMPRESSION: No complications following left hip arthroplasty.   Original Report Authenticated By: [name], M.D.    Disposition:       Discharge Orders   Future Orders Complete By Expires     Call MD / Call 911  As directed     Comments:      If you experience chest pain or shortness of breath, CALL 911 and be  transported to the hospital emergency room.  If you develope a fever above 101 F, pus (white drainage) or increased drainage or redness at the wound, or calf pain, call your surgeon's office.    Constipation Prevention  As directed     Comments:      Drink plenty of fluids.  Prune juice may be helpful.  You may use a stool softener, such as Colace (over the counter) 100 mg twice a day.  Use MiraLax (over the counter) for constipation as needed.  Diet - low sodium heart healthy  As directed     Discharge patient  As directed     Discharge wound care:  As directed     Comments:      Keep dressing clean dry and intact until Tuesday then remove dressing and shower. Apply clean dressing after showering    Increase activity slowly as tolerated  As directed     Weight bearing as tolerated  As directed        Follow-up Information   Follow up with Kathryne Hitch, MD In 2 weeks.   Contact information:   [address] [address] [place] [address] [phone]        Signed: Kathryne Hitch 05/10/2013, 6:51 AM    Patient ID: [name] MRN: [id number] DOB/AGE: Apr 06, 1941 72 y.o.  Admit date: 05/07/2013 Discharge date: 05/10/2013  Admission Diagnoses:  Principal Problem:   Degenerative arthritis of hip   Discharge Diagnoses:  Same  Past Medical History  Diagnosis Date  . GERD (gastroesophageal reflux disease)   . Arthritis   . COPD (chronic obstructive pulmonary disease)   . Barrett's esophagus     Surgeries: Procedure(s): LEFT TOTAL HIP ARTHROPLASTY ANTERIOR APPROACH on 05/07/2013   Consultants:    Discharged Condition: Improved  Hospital Course: KAJ VASIL is an 72 y.o. male who was admitted 05/07/2013 for operative treatment ofDegenerative arthritis of hip. Patient has severe unremitting pain that affects sleep, daily activities, and work/hobbies. After pre-op clearance the patient was taken to the operating room on 05/07/2013 and underwent   Procedure(s): LEFT TOTAL HIP ARTHROPLASTY ANTERIOR APPROACH.    Patient was given perioperative antibiotics: Anti-infectives   Start     Dose/Rate Route Frequency Ordered Stop   05/07/13 1910  ceFAZolin (ANCEF) IVPB 1 g/50 mL premix     1 g 100 mL/hr over 30 Minutes Intravenous Every 6 hours 05/07/13 1749 05/08/13 0205   05/07/13 0600  ceFAZolin (ANCEF) IVPB 2 g/50 mL premix     2 g 100 mL/hr over 30 Minutes Intravenous On call to O.R. 05/06/13 1402 05/07/13 1310       Patient was given sequential compression devices, early ambulation, and chemoprophylaxis to prevent DVT.  Patient benefited maximally from hospital stay and there were no complications.    Recent vital signs: Patient Vitals for the past 24 hrs:  BP Temp Temp src Pulse Resp SpO2  05/10/13 0600 131/69 mmHg 98.6 F (37 C) Oral 86 18 98 %  05/09/13 2126 118/63 mmHg 98.8 F (37.1 C) Oral 89 18 97 %  05/09/13 1600 - - - - 18 -  05/09/13 1200 - - - - 18 -  05/09/13 0800 - - - - 18 -     Recent laboratory studies:  Recent Labs  05/08/13 0520 05/09/13 0500 05/10/13 0545  WBC 7.5 8.8 8.3  HGB 13.4 12.3* 12.8*  HCT 38.7* 35.6* 37.2*  PLT 199 185 196  NA 134*  --   --   K 4.8  --   --   CL 100  --   --   CO2 27  --   --   BUN 12  --   --   CREATININE 0.93  --   --   GLUCOSE 113*  --   --   CALCIUM 8.9  --   --      Discharge Medications:     Medication List    STOP taking these medications  ALEVE PO      TAKE these medications       aspirin 325 MG EC tablet  Take 1 tablet (325 mg total) by mouth 2 (two) times daily.     colesevelam 625 MG tablet  Commonly known as:  WELCHOL  Take 1,875 mg by mouth daily.     ezetimibe 10 MG tablet  Commonly known as:  ZETIA  Take 10 mg by mouth daily.     methocarbamol 500 MG tablet  Commonly known as:  ROBAXIN  Take 1 tablet (500 mg total) by mouth every 6 (six) hours as needed.     oxyCODONE-acetaminophen 5-325 MG per tablet  Commonly known as:   ROXICET  Take 1-2 tablets by mouth every 4 (four) hours as needed for pain.     pantoprazole 40 MG tablet  Commonly known as:  PROTONIX  Take 40 mg by mouth daily.        Diagnostic Studies: X-ray Chest Pa Or Ap  05/07/2013   *RADIOLOGY REPORT*  Clinical Data: Possible lung nodule  CHEST - 1 VIEW  Comparison: 04/26/2013  Findings: Cardiomediastinal silhouette is stable.  No acute infiltrate or pulmonary edema.  Bilateral nipple markers are noted. No lung nodule is noted.  Nodular density on the prior study corresponds to the nipple.  IMPRESSION: No acute infiltrate or pulmonary edema.  No lung nodule.  Bilateral nipple markers.   Original Report Authenticated By: [name], M.D.   Dg Chest 2 View  04/26/2013   *RADIOLOGY REPORT*  Clinical Data: Preop hip surgery.  Nonsmoker.  CHEST - 2 VIEW  Comparison: 03/02/2007.  Findings: No infiltrate, congestive heart failure or pneumothorax.  Apical pleural thickening without associated bony destruction and without significant change.  Question prominent nipple shadow on the left.  Repeat PA view with nipple marker placed recommended to exclude underlying nodule.  Heart size within normal limits.  Mild degenerative changes thoracic spine.  IMPRESSION: No acute abnormality.  Question prominent nipple shadow on the left.  Repeat PA view with nipple marker placed recommended to exclude underlying nodule.  This is a call report.   Original Report Authenticated By: [name], M.D.   Dg Hip Operative Left  05/07/2013   *RADIOLOGY REPORT*  Clinical Data: Left total hip arthroplasty, anterior approach.  OPERATIVE LEFT HIP  Comparison: Pelvic radiographs 03/06/2007.  Findings: Two spot fluoroscopic images of the lower pelvis and left hip demonstrate interval left total hip arthroplasty.  Hardware appears well positioned.  There is no evidence of acute fracture or dislocation.  Previous right total hip arthroplasty is partially imaged.  IMPRESSION: Intraoperative  views following left total hip arthroplasty.  No demonstrated complication.   Original Report Authenticated By: [name], M.D.   Dg Pelvis Portable  05/07/2013   *RADIOLOGY REPORT*  Clinical Data: Left lobe arthroplasty  PORTABLE PELVIS  Comparison: None.  Findings: Left total hip arthroplasty.  Femoral component acetabular component appear well seated.  Right lobe arthroplasty noted  IMPRESSION: Left hip total arthroplasty without complication.   Original Report Authenticated By: [name], M.D.   Dg Hip Portable 1 View Left  05/07/2013   *RADIOLOGY REPORT*  Clinical Data: Postop  PORTABLE LEFT HIP - 1 VIEW  Comparison: 05/07/2013  Findings: Interval left hip total arthroplasty.  No evidence of fracture.  IMPRESSION: No complications following left hip arthroplasty.   Original Report Authenticated By: [name], M.D.    Disposition:   To home      Discharge Orders  Future Orders Complete By Expires     Call MD / Call 911  As directed     Comments:      If you experience chest pain or shortness of breath, CALL 911 and be transported to the hospital emergency room.  If you develope a fever above 101 F, pus (white drainage) or increased drainage or redness at the wound, or calf pain, call your surgeon's office.    Constipation Prevention  As directed     Comments:      Drink plenty of fluids.  Prune juice may be helpful.  You may use a stool softener, such as Colace (over the counter) 100 mg twice a day.  Use MiraLax (over the counter) for constipation as needed.    Diet - low sodium heart healthy  As directed     Discharge patient  As directed     Discharge wound care:  As directed     Comments:      Keep dressing clean dry and intact until Tuesday then remove dressing and shower. Apply clean dressing after showering    Increase activity slowly as tolerated  As directed     Weight bearing as tolerated  As directed        Follow-up Information   Follow up with  Kathryne Hitch, MD In 2 weeks.   Contact information:   [address] [address] [place] [address] [phone]        Signed: Kathryne Hitch 05/10/2013, 6:51 AM

## 2013-05-10 NOTE — Progress Notes (Signed)
Physical Therapy Treatment Patient Details Name: [name] MRN: [id number] DOB: May 02, 1941 Today's Date: 05/10/2013 Time: 4098-1191 PT Time Calculation (min): 15 min  PT Assessment / Plan / Recommendation Comments on Treatment Session  Patient continues to progress well. Eager to DC home today    Follow Up Recommendations  Home health PT     Does the patient have the potential to tolerate intense rehabilitation     Barriers to Discharge        Equipment Recommendations  None recommended by PT    Recommendations for Other Services    Frequency 7X/week   Plan Discharge plan remains appropriate    Precautions / Restrictions Precautions Precaution Comments: no true precautions but reviewed anterior approach to warn of anterior muscle weakness Restrictions Weight Bearing Restrictions: Yes LLE Weight Bearing: Weight bearing as tolerated   Pertinent Vitals/Pain     Mobility  Bed Mobility Supine to Sit: 5: Supervision;With rails;HOB flat Sit to Supine: 5: Supervision Transfers Sit to Stand: 5: Supervision;With upper extremity assist;From bed Stand to Sit: 5: Supervision;With upper extremity assist;To bed Ambulation/Gait Ambulation/Gait Assistance: 5: Supervision Ambulation Distance (Feet): 150 Feet Assistive device: Rolling walker Ambulation/Gait Assistance Details: Cues for posture Gait Pattern: Step-through pattern;Decreased stride length    Exercises Total Joint Exercises Quad Sets: AROM;Strengthening;Both;10 reps;Supine Heel Slides: AROM;Left;10 reps;Supine Hip ABduction/ADduction: AAROM;Left;10 reps;Supine Long Arc Quad: Strengthening;Left;10 reps;Seated;AROM   PT Diagnosis:    PT Problem List:   PT Treatment Interventions:     PT Goals Acute Rehab PT Goals PT Goal: Supine/Side to Sit - Progress: Progressing toward goal PT Goal: Sit to Supine/Side - Progress: Progressing toward goal PT Goal: Sit to Stand - Progress: Progressing toward goal PT Goal:  Stand to Sit - Progress: Progressing toward goal PT Goal: Ambulate - Progress: Progressing toward goal PT Goal: Perform Home Exercise Program - Progress: Progressing toward goal  Visit Information  Last PT Received On: 05/10/13 Assistance Needed: +1    Subjective Data      Cognition  Cognition Arousal/Alertness: Awake/alert Behavior During Therapy: WFL for tasks assessed/performed Overall Cognitive Status: Within Functional Limits for tasks assessed    Balance     End of Session PT - End of Session Equipment Utilized During Treatment: Gait belt Activity Tolerance: Patient tolerated treatment well Patient left: in bed;with call bell/phone within reach;with family/visitor present Nurse Communication: Mobility status   GP     Fredrich Birks 05/10/2013, 10:09 AM 05/10/2013 Fredrich Birks PTA [phone] pager [phone] office

## 2013-05-10 NOTE — Progress Notes (Signed)
Occupational Therapy Treatment Patient Details Name: [name] MRN: [id number] DOB: 1941-10-31 Today's Date: 05/10/2013 Time: 4098-1191 OT Time Calculation (min): 16 min  OT Assessment / Plan / Recommendation Comments on Treatment Session Practiced tub transfer today with pt and wife present. Pt and wife did not have any other concerns prior to d/c.     Follow Up Recommendations  No OT follow up;Supervision/Assistance - 24 hour    Barriers to Discharge       Equipment Recommendations  None recommended by OT    Recommendations for Other Services    Frequency Min 2X/week   Plan Discharge plan remains appropriate    Precautions / Restrictions Precautions Precautions: None Precaution Comments:  Restrictions Weight Bearing Restrictions: Yes LLE Weight Bearing: Weight bearing as tolerated   Pertinent Vitals/Pain Pain 5/10 in hip. Repositioned.     ADL  Eating/Feeding: Performed;Independent Where Assessed - Eating/Feeding: Bed level Toilet Transfer: Simulated;Min guard Toilet Transfer Method: Sit to Barista: Raised toilet seat with arms (or 3-in-1 over toilet);Other (comment) (from bed as well) Tub/Shower Transfer: Performed;Min guard; Min A Tub/Shower Transfer Method: Science writer: Other (comment) (3 in 1) Equipment Used: Gait belt;Rolling walker ADL Comments: Practiced tub transfer. Pt able to step over but insistant on holding both hands on walker as he stepped over (Min A to hold walker). OT educated wife to hold walker to prevent it from moving and explained this is not the safest option. Pt then practiced backing up to tub and sat on 3 in 1 (Minguard assist). Wife agreed this was much safer and she will be present for tub transfer.     OT Diagnosis:    OT Problem List:   OT Treatment Interventions:     OT Goals Acute Rehab OT Goals OT Goal Formulation: With patient Time For Goal Achievement:  05/15/13 Potential to Achieve Goals: Good ADL Goals Pt Will Transfer to Toilet: with modified independence;Ambulation;with DME;Comfort height toilet ADL Goal: Toilet Transfer - Progress: Progressing toward goals Pt Will Perform Tub/Shower Transfer: Tub transfer;with supervision;Ambulation;with DME;Shower seat with back ADL Goal: Web designer - Progress: Progressing toward goals Miscellaneous OT Goals Miscellaneous OT Goal #1: Pt will be able to demonstrate use of reacher/sock aid for LB dressing with setup assist. Miscellaneous OT Goal #2: Pt will perform bed mobility at supervision level as precursor for EOB ADLs. OT Goal: Miscellaneous Goal #2 - Progress: Progressing toward goals (HOB was elevated)  Visit Information  Last OT Received On: 05/10/13 Assistance Needed: +1    Subjective Data      Prior Functioning       Cognition  Cognition Arousal/Alertness: Awake/alert Behavior During Therapy: WFL for tasks assessed/performed Overall Cognitive Status: Within Functional Limits for tasks assessed    Mobility  Bed Mobility Bed Mobility: Supine to Sit;Sitting - Scoot to Edge of Bed Supine to Sit: 5: Supervision;HOB elevated Sitting - Scoot to Edge of Bed: 5: Supervision Details for Bed Mobility Assistance: increased time. supervision for safety Transfers Transfers: Sit to Stand;Stand to Sit Sit to Stand: 4: Min guard;With upper extremity assist;From bed;From chair/3-in-1 Stand to Sit: 4: Min guard;With upper extremity assist;To chair/3-in-1 Details for Transfer Assistance: Minguard for transfers and cues to control decent when sitting in chair.        Balance     End of Session OT - End of Session Equipment Utilized During Treatment: Gait belt Activity Tolerance: Patient tolerated treatment well Patient left: in chair;with family/visitor present  GO  Earlie Raveling OTR/L [phone] 05/10/2013, 12:28 PM

## 2013-09-16 ENCOUNTER — Other Ambulatory Visit: Payer: Self-pay | Admitting: Gastroenterology

## 2014-07-14 ENCOUNTER — Other Ambulatory Visit (HOSPITAL_COMMUNITY): Payer: Self-pay | Admitting: Orthopaedic Surgery

## 2014-07-14 DIAGNOSIS — M25551 Pain in right hip: Secondary | ICD-10-CM

## 2014-07-17 ENCOUNTER — Encounter (HOSPITAL_COMMUNITY): Payer: Medicare Other

## 2014-07-17 ENCOUNTER — Encounter (HOSPITAL_COMMUNITY)
Admission: RE | Admit: 2014-07-17 | Discharge: 2014-07-17 | Disposition: A | Discharge status: Home / Self Care | Payer: Medicare Other | Source: Ambulatory Visit | Attending: Diagnostic Radiology | Admitting: Diagnostic Radiology

## 2014-07-17 DIAGNOSIS — Y831 Surgical operation with implant of artificial internal device as the cause of abnormal reaction of the patient, or of later complication, without mention of misadventure at the time of the procedure: Secondary | ICD-10-CM | POA: Diagnosis not present

## 2014-07-17 DIAGNOSIS — T8489XA Other specified complication of internal orthopedic prosthetic devices, implants and grafts, initial encounter: Secondary | ICD-10-CM | POA: Insufficient documentation

## 2014-07-17 DIAGNOSIS — M25559 Pain in unspecified hip: Secondary | ICD-10-CM | POA: Insufficient documentation

## 2014-07-17 DIAGNOSIS — Y929 Unspecified place or not applicable: Secondary | ICD-10-CM | POA: Diagnosis not present

## 2014-07-17 DIAGNOSIS — M25551 Pain in right hip: Secondary | ICD-10-CM

## 2014-07-17 MED ORDER — TECHNETIUM TC 99M MEDRONATE IV KIT
25.0000 | PACK | Freq: Once | INTRAVENOUS | Status: AC | PRN
Start: 2014-07-17 — End: 2014-07-17
  Administered 2014-07-17: 25 via INTRAVENOUS

## 2014-10-27 DIAGNOSIS — R972 Elevated prostate specific antigen [PSA]: Secondary | ICD-10-CM | POA: Insufficient documentation

## 2014-10-27 DIAGNOSIS — Z8042 Family history of malignant neoplasm of prostate: Secondary | ICD-10-CM | POA: Insufficient documentation

## 2014-12-03 ENCOUNTER — Other Ambulatory Visit (HOSPITAL_COMMUNITY): Payer: Self-pay | Admitting: Orthopedic Surgery

## 2014-12-03 DIAGNOSIS — M25551 Pain in right hip: Secondary | ICD-10-CM

## 2014-12-25 ENCOUNTER — Encounter (HOSPITAL_COMMUNITY)
Admission: RE | Admit: 2014-12-25 | Discharge: 2014-12-25 | Disposition: A | Discharge status: Home / Self Care | Payer: Medicare Other | Source: Ambulatory Visit | Attending: Orthopedic Surgery | Admitting: Orthopedic Surgery

## 2014-12-25 ENCOUNTER — Ambulatory Visit (HOSPITAL_COMMUNITY)
Admission: RE | Admit: 2014-12-25 | Discharge: 2014-12-25 | Disposition: A | Discharge status: Home / Self Care | Payer: Medicare Other | Source: Ambulatory Visit | Attending: Orthopedic Surgery | Admitting: Orthopedic Surgery

## 2014-12-25 DIAGNOSIS — M25551 Pain in right hip: Secondary | ICD-10-CM | POA: Diagnosis not present

## 2014-12-25 DIAGNOSIS — M25451 Effusion, right hip: Secondary | ICD-10-CM | POA: Diagnosis not present

## 2014-12-25 DIAGNOSIS — Z96643 Presence of artificial hip joint, bilateral: Secondary | ICD-10-CM | POA: Diagnosis not present

## 2014-12-25 MED ORDER — TECHNETIUM TC 99M MEDRONATE IV KIT
25.0000 | PACK | Freq: Once | INTRAVENOUS | Status: AC | PRN
  Administered 2014-12-25: 25 via INTRAVENOUS

## 2015-01-09 ENCOUNTER — Other Ambulatory Visit: Payer: Self-pay | Admitting: Orthopedic Surgery

## 2015-01-21 NOTE — Pre-Procedure Instructions (Signed)
[name]  01/21/2015   Your procedure is scheduled on:  2.15.16  Report to [facility] short stay admitting at 800 AM.  Call this number if you have problems the morning of surgery: [phone]   Remember:   Do not eat food or drink liquids after midnight.  Take these medicines the morning of surgery with A SIP OF WATER: none    STOP all herbel meds, nsaids (aleve,naproxen,advil,ibuprofen) 5 days prior to surgery starting 01/28/15 including aspirin, vitamins   Do not wear jewelry, make-up or nail polish.  Do not wear lotions, powders, or perfumes. You may wear deodorant.  Do not shave 48 hours prior to surgery. Men may shave face and neck.  Do not bring valuables to the hospital.  [facility] is not responsible                  for any belongings or valuables.               Contacts, dentures or bridgework may not be worn into surgery.  Leave suitcase in the car. After surgery it may be brought to your room.  For patients admitted to the hospital, discharge time is determined by your                treatment team.               Patients discharged the day of surgery will not be allowed to drive  home.  Name and phone number of your driver:   Special Instructions:  Special Instructions: [place] - Preparing for Surgery  Before surgery, you can play an important role.  Because skin is not sterile, your skin needs to be as free of germs as possible.  You can reduce the number of germs on you skin by washing with CHG (chlorahexidine gluconate) soap before surgery.  CHG is an antiseptic cleaner which kills germs and bonds with the skin to continue killing germs even after washing.  Please DO NOT use if you have an allergy to CHG or antibacterial soaps.  If your skin becomes reddened/irritated stop using the CHG and inform your nurse when you arrive at Short Stay.  Do not shave (including legs and underarms) for at least 48 hours prior to the first CHG shower.  You may shave your  face.  Please follow these instructions carefully:   1.  Shower with CHG Soap the night before surgery and the morning of Surgery.  2.  If you choose to wash your hair, wash your hair first as usual with your normal shampoo.  3.  After you shampoo, rinse your hair and body thoroughly to remove the Shampoo.  4.  Use CHG as you would any other liquid soap.  You can apply chg directly  to the skin and wash gently with scrungie or a clean washcloth.  5.  Apply the CHG Soap to your body ONLY FROM THE NECK DOWN.  Do not use on open wounds or open sores.  Avoid contact with your eyes ears, mouth and genitals (private parts).  Wash genitals (private parts)       with your normal soap.  6.  Wash thoroughly, paying special attention to the area where your surgery will be performed.  7.  Thoroughly rinse your body with warm water from the neck down.  8.  DO NOT shower/wash with your normal soap after using and rinsing off the CHG Soap.  9.  Pat yourself  dry with a clean towel.            10.  Wear clean pajamas.            11.  Place clean sheets on your bed the night of your first shower and do not sleep with pets.  Day of Surgery  Do not apply any lotions/deodorants the morning of surgery.  Please wear clean clothes to the hospital/surgery center.   Please read over the following fact sheets that you were given: Pain Booklet, Coughing and Deep Breathing, Blood Transfusion Information, Total Joint Packet, MRSA Information and Surgical Site Infection Prevention

## 2015-01-22 ENCOUNTER — Encounter (HOSPITAL_COMMUNITY)
Admission: RE | Admit: 2015-01-22 | Discharge: 2015-01-22 | Disposition: A | Discharge status: Home / Self Care | Payer: Medicare Other | Source: Ambulatory Visit | Attending: Orthopedic Surgery | Admitting: Orthopedic Surgery

## 2015-01-22 ENCOUNTER — Encounter (HOSPITAL_COMMUNITY): Payer: Self-pay

## 2015-01-22 DIAGNOSIS — Z0183 Encounter for blood typing: Secondary | ICD-10-CM | POA: Diagnosis not present

## 2015-01-22 DIAGNOSIS — Z01812 Encounter for preprocedural laboratory examination: Secondary | ICD-10-CM | POA: Insufficient documentation

## 2015-01-22 DIAGNOSIS — R001 Bradycardia, unspecified: Secondary | ICD-10-CM | POA: Insufficient documentation

## 2015-01-22 DIAGNOSIS — Z01818 Encounter for other preprocedural examination: Secondary | ICD-10-CM

## 2015-01-22 HISTORY — DX: Personal history of colonic polyps: Z86.010

## 2015-01-22 HISTORY — DX: Pain in unspecified joint: M25.50

## 2015-01-22 HISTORY — DX: Personal history of colon polyps, unspecified: Z86.0100

## 2015-01-22 HISTORY — DX: Other muscle spasm: M62.838

## 2015-01-22 HISTORY — DX: Hyperlipidemia, unspecified: E78.5

## 2015-01-22 LAB — SURGICAL PCR SCREEN
MRSA, PCR: NEGATIVE
Staphylococcus aureus: NEGATIVE

## 2015-01-22 LAB — URINALYSIS, ROUTINE W REFLEX MICROSCOPIC
BILIRUBIN URINE: NEGATIVE
GLUCOSE, UA: NEGATIVE mg/dL
HGB URINE DIPSTICK: NEGATIVE
Ketones, ur: NEGATIVE mg/dL
Leukocytes, UA: NEGATIVE
Nitrite: NEGATIVE
PH: 6.5 (ref 5.0–8.0)
Protein, ur: NEGATIVE mg/dL
Specific Gravity, Urine: 1.006 (ref 1.005–1.030)
Urobilinogen, UA: 0.2 mg/dL (ref 0.0–1.0)

## 2015-01-22 LAB — CBC WITH DIFFERENTIAL/PLATELET
BASOS PCT: 1 % (ref 0–1)
Basophils Absolute: 0.1 10*3/uL (ref 0.0–0.1)
EOS ABS: 0.2 10*3/uL (ref 0.0–0.7)
Eosinophils Relative: 3 % (ref 0–5)
HCT: 44.9 % (ref 39.0–52.0)
HEMOGLOBIN: 16.3 g/dL (ref 13.0–17.0)
LYMPHS PCT: 26 % (ref 12–46)
Lymphs Abs: 1.9 10*3/uL (ref 0.7–4.0)
MCH: 33 pg (ref 26.0–34.0)
MCHC: 36.3 g/dL — AB (ref 30.0–36.0)
MCV: 90.9 fL (ref 78.0–100.0)
MONO ABS: 0.6 10*3/uL (ref 0.1–1.0)
Monocytes Relative: 9 % (ref 3–12)
NEUTROS PCT: 61 % (ref 43–77)
Neutro Abs: 4.4 10*3/uL (ref 1.7–7.7)
Platelets: 232 10*3/uL (ref 150–400)
RBC: 4.94 MIL/uL (ref 4.22–5.81)
RDW: 12.9 % (ref 11.5–15.5)
WBC: 7 10*3/uL (ref 4.0–10.5)

## 2015-01-22 LAB — TYPE AND SCREEN
ABO/RH(D): O POS
Antibody Screen: NEGATIVE

## 2015-01-22 LAB — BASIC METABOLIC PANEL
ANION GAP: 7 (ref 5–15)
BUN: 11 mg/dL (ref 6–23)
CALCIUM: 9.7 mg/dL (ref 8.4–10.5)
CO2: 27 mmol/L (ref 19–32)
CREATININE: 0.93 mg/dL (ref 0.50–1.35)
Chloride: 105 mmol/L (ref 96–112)
GFR calc non Af Amer: 82 mL/min — ABNORMAL LOW (ref >90)
Glucose, Bld: 92 mg/dL (ref 70–99)
POTASSIUM: 4.3 mmol/L (ref 3.5–5.1)
SODIUM: 139 mmol/L (ref 135–145)

## 2015-01-22 LAB — PROTIME-INR
INR: 0.95 (ref 0.00–1.49)
Prothrombin Time: 12.8 seconds (ref 11.6–15.2)

## 2015-01-22 LAB — APTT: aPTT: 27 seconds (ref 24–37)

## 2015-01-22 MED ORDER — CHLORHEXIDINE GLUCONATE 4 % EX LIQD: 60.0000 mL | Freq: Once | CUTANEOUS | Status: DC

## 2015-01-22 NOTE — Pre-Procedure Instructions (Signed)
[name]  01/22/2015   Your procedure is scheduled on:  Mon, Feb 15 @ 10:00 AM  Report to [name] A  at 8:00 AM.  Call this number if you have problems the morning of surgery: [phone]   Remember:   Do not eat food or drink liquids after midnight.   Take these medicines the morning of surgery with A SIP OF WATER: Pain Pill(if needed) and Pantoprazole(Protonix)               Stop taking your Aspirin and Ibuprofen. No Goody's,BC's,Aleve,Fish Oil,or any Herbal Medications   Do not wear jewelry  Do not wear lotions, powders, or colognes. You may wear deodorant.  Men may shave face and neck.  Do not bring valuables to the hospital.  [name] is not responsible                  for any belongings or valuables.               Contacts, dentures or bridgework may not be worn into surgery.  Leave suitcase in the car. After surgery it may be brought to your room.  For patients admitted to the hospital, discharge time is determined by your                treatment team.                 Special Instructions:  [name] - Preparing for Surgery  Before surgery, you can play an important role.  Because skin is not sterile, your skin needs to be as free of germs as possible.  You can reduce the number of germs on you skin by washing with CHG (chlorahexidine gluconate) soap before surgery.  CHG is an antiseptic cleaner which kills germs and bonds with the skin to continue killing germs even after washing.  Please DO NOT use if you have an allergy to CHG or antibacterial soaps.  If your skin becomes reddened/irritated stop using the CHG and inform your nurse when you arrive at Short Stay.  Do not shave (including legs and underarms) for at least 48 hours prior to the first CHG shower.  You may shave your face.  Please follow these instructions carefully:   1.  Shower with CHG Soap the night before surgery and the                                morning of Surgery.  2.  If  you choose to wash your hair, wash your hair first as usual with your       normal shampoo.  3.  After you shampoo, rinse your hair and body thoroughly to remove the                      Shampoo.  4.  Use CHG as you would any other liquid soap.  You can apply chg directly       to the skin and wash gently with scrungie or a clean washcloth.  5.  Apply the CHG Soap to your body ONLY FROM THE NECK DOWN.        Do not use on open wounds or open sores.  Avoid contact with your eyes,       ears, mouth and genitals (private parts).  Wash genitals (private parts)       with your  normal soap.  6.  Wash thoroughly, paying special attention to the area where your surgery        will be performed.  7.  Thoroughly rinse your body with warm water from the neck down.  8.  DO NOT shower/wash with your normal soap after using and rinsing off       the CHG Soap.  9.  Pat yourself dry with a clean towel.            10.  Wear clean pajamas.            11.  Place clean sheets on your bed the night of your first shower and do not        sleep with pets.  Day of Surgery  Do not apply any lotions/deoderants the morning of surgery.  Please wear clean clothes to the hospital/surgery center.     Please read over the following fact sheets that you were given: Pain Booklet, Coughing and Deep Breathing, Blood Transfusion Information, MRSA Information and Surgical Site Infection Prevention

## 2015-01-22 NOTE — Progress Notes (Addendum)
Medical Md is Dr.Richard Pang  Pt doesn't have a cardiologist  Denies ever having an echo/stress test/heart cath  Denies EKG or CXR in past yr

## 2015-01-29 NOTE — H&P (Signed)
TOTAL HIP REVISION ADMISSION H&P  Patient is admitted for right revision total hip arthroplasty.  Subjective:  Chief Complaint: right hip pain  HPI: [name], 74 y.o. male, has a history of pain and functional disability in the right hip due to arthritis and patient has failed non-surgical conservative treatments for greater than 12 weeks to include NSAID's and/or analgesics, flexibility and strengthening excercises, use of assistive devices, weight reduction as appropriate and activity modification. The indications for the revision total hip arthroplasty are loosening of one or more components.  Onset of symptoms was gradual starting 2 years ago with rapidlly worsening course since that time.  Prior procedures on the right hip include arthroplasty.  Patient currently rates pain in the right hip at 10 out of 10 with activity.  There is night pain, worsening of pain with activity and weight bearing, pain that interfers with activities of daily living and pain with passive range of motion. Patient has evidence of prosthetic loosening by imaging studies.  This condition presents safety issues increasing the risk of falls.  There is no current active infection.  Patient Active Problem List   Diagnosis Date Noted  . Degenerative arthritis of hip 05/07/2013   Past Medical History  Diagnosis Date  . Arthritis   . Barrett's esophagus   . Hyperlipidemia     takes Zetia and Welchol daily  . Muscle spasm     takes Robaxin daily as needed  . GERD (gastroesophageal reflux disease)     takes Protonix daily  . Joint pain   . History of colon polyps     Past Surgical History  Procedure Laterality Date  . Knee arthroscopy Right 2011  . 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 Total hip arthroplasty Right   . Esophagogastroduodenoscopy     . Colonoscopy      No prescriptions prior to admission   Allergies  Allergen Reactions  . Statins Other (See Comments)    Muscle pain    History  Substance Use Topics  . Smoking status: Former Research scientist (life sciences)  . Smokeless tobacco: Never Used     Comment: quit smoking 23yrs ago  . Alcohol Use: No    No family history on file.    Review of Systems  Constitutional: Negative.   HENT: Negative.   Eyes: Negative.   Respiratory: Negative.   Cardiovascular: Negative.   Gastrointestinal: Negative.   Genitourinary: Negative.   Musculoskeletal: Positive for myalgias and joint pain.  Skin: Negative.   Neurological: Negative.   Endo/Heme/Allergies: Negative.   Psychiatric/Behavioral: Negative.     Objective:  Physical Exam  Constitutional: He is oriented to person, place, and time. He appears well-developed and well-nourished.  HENT:  Head: Normocephalic and atraumatic.  Eyes: Pupils are equal, round, and reactive to light.  Neck: Normal range of motion. Neck supple.  Cardiovascular: Intact distal pulses.   Respiratory: Effort normal.  Musculoskeletal: He exhibits tenderness.  Patient walks with a right sided Trendelenburg gait and antalgic limp.  Internal rotation of the right hip causes significant discomfort, less so external rotation.  Foot tap is negative.  His surgical scar is well-healed no erythema no swelling.  His contralateral left total hip has no discomfort with internal or external rotation.  His MRI scan is reviewed and does show a fluid collection anterior to the femoral component.  Neurological: He is alert and oriented to person, place, and time.  Skin: Skin is warm and dry.  Psychiatric: He has a normal mood and affect. His behavior is normal. Judgment and thought content normal.    Vital signs in last 24 hours:     Labs:   Estimated body mass index is 26.33 kg/(m^2) as calculated from the following:   Height as of 04/26/13: 6' (1.829 m).   Weight as of  04/26/13: 88.089 kg (194 lb 3.2 oz).  Imaging Review:  Plain radiographs demonstrate plain x-rays from 2008, R compared x-rays done 2015 and the component, which was at 45 of abduction has is now almost horizontal consistent with loosening and rotation of the acetabular component.  Assessment/Plan:  End stage arthritis, right hip(s) with failed previous arthroplasty.  The patient history, physical examination, clinical judgement of the provider and imaging studies are consistent with end stage degenerative joint disease of the right hip(s), previous total hip arthroplasty. Revision total hip arthroplasty is deemed medically necessary. The treatment options including medical management, injection therapy, arthroscopy and arthroplasty were discussed at length. The risks and benefits of total hip arthroplasty were presented and reviewed. The risks due to aseptic loosening, infection, stiffness, dislocation/subluxation,  thromboembolic complications and other imponderables were discussed.  The patient acknowledged the explanation, agreed to proceed with the plan and consent was signed. Patient is being admitted for inpatient treatment for surgery, pain control, PT, OT, prophylactic antibiotics, VTE prophylaxis, progressive ambulation and ADL's and discharge planning. The patient is planning to be discharged home with home health services

## 2015-02-01 MED ORDER — CEFAZOLIN SODIUM-DEXTROSE 2-3 GM-% IV SOLR
2.0000 g | INTRAVENOUS | Status: AC
  Administered 2015-02-02: 2 g via INTRAVENOUS
  Filled 2015-02-01: qty 50

## 2015-02-02 ENCOUNTER — Inpatient Hospital Stay (HOSPITAL_COMMUNITY)
Admission: RE | Admit: 2015-02-02 | Discharge: 2015-02-04 | DRG: 470 | Disposition: A | Discharge status: Home / Self Care | Payer: Medicare Other | Source: Ambulatory Visit | Attending: Orthopedic Surgery | Admitting: Orthopedic Surgery

## 2015-02-02 ENCOUNTER — Inpatient Hospital Stay (HOSPITAL_COMMUNITY): Payer: Medicare Other | Admitting: Anesthesiology

## 2015-02-02 ENCOUNTER — Encounter (HOSPITAL_COMMUNITY)
Admission: RE | Disposition: A | Discharge status: Home / Self Care | Payer: Self-pay | Source: Ambulatory Visit | Attending: Orthopedic Surgery

## 2015-02-02 ENCOUNTER — Encounter (HOSPITAL_COMMUNITY): Payer: Self-pay | Admitting: *Deleted

## 2015-02-02 ENCOUNTER — Inpatient Hospital Stay (HOSPITAL_COMMUNITY): Payer: Medicare Other

## 2015-02-02 DIAGNOSIS — Z86010 Personal history of colonic polyps: Secondary | ICD-10-CM | POA: Diagnosis not present

## 2015-02-02 DIAGNOSIS — E785 Hyperlipidemia, unspecified: Secondary | ICD-10-CM | POA: Diagnosis present

## 2015-02-02 DIAGNOSIS — D62 Acute posthemorrhagic anemia: Secondary | ICD-10-CM | POA: Diagnosis not present

## 2015-02-02 DIAGNOSIS — M1611 Unilateral primary osteoarthritis, right hip: Principal | ICD-10-CM | POA: Diagnosis present

## 2015-02-02 DIAGNOSIS — K219 Gastro-esophageal reflux disease without esophagitis: Secondary | ICD-10-CM | POA: Diagnosis present

## 2015-02-02 DIAGNOSIS — K2270 Barrett's esophagus without dysplasia: Secondary | ICD-10-CM | POA: Diagnosis present

## 2015-02-02 DIAGNOSIS — Z7982 Long term (current) use of aspirin: Secondary | ICD-10-CM | POA: Diagnosis not present

## 2015-02-02 DIAGNOSIS — Z87891 Personal history of nicotine dependence: Secondary | ICD-10-CM | POA: Diagnosis not present

## 2015-02-02 DIAGNOSIS — T84030A Mechanical loosening of internal right hip prosthetic joint, initial encounter: Secondary | ICD-10-CM

## 2015-02-02 DIAGNOSIS — M25551 Pain in right hip: Secondary | ICD-10-CM | POA: Diagnosis present

## 2015-02-02 DIAGNOSIS — Z96649 Presence of unspecified artificial hip joint: Secondary | ICD-10-CM

## 2015-02-02 HISTORY — PX: TOTAL HIP REVISION: SHX763

## 2015-02-02 SURGERY — TOTAL HIP REVISION
Anesthesia: Monitor Anesthesia Care | Site: Hip | Laterality: Right

## 2015-02-02 MED ORDER — HYDROMORPHONE HCL 1 MG/ML IJ SOLN
1.0000 mg | INTRAMUSCULAR | Status: DC | PRN
  Administered 2015-02-02: 1 mg via INTRAVENOUS
  Filled 2015-02-02: qty 1

## 2015-02-02 MED ORDER — MIDAZOLAM HCL 2 MG/2ML IJ SOLN
INTRAMUSCULAR | Status: AC
  Filled 2015-02-02: qty 2

## 2015-02-02 MED ORDER — ACETAMINOPHEN 650 MG RE SUPP: 650.0000 mg | Freq: Four times a day (QID) | RECTAL | Status: DC | PRN

## 2015-02-02 MED ORDER — SODIUM CHLORIDE 0.9 % IR SOLN
Status: DC | PRN
  Administered 2015-02-02: 1000 mL

## 2015-02-02 MED ORDER — ONDANSETRON HCL 4 MG/2ML IJ SOLN: 4.0000 mg | Freq: Four times a day (QID) | INTRAMUSCULAR | Status: DC | PRN

## 2015-02-02 MED ORDER — ACETAMINOPHEN 325 MG PO TABS
650.0000 mg | ORAL_TABLET | Freq: Four times a day (QID) | ORAL | Status: DC | PRN
  Administered 2015-02-03: 650 mg via ORAL
  Filled 2015-02-02: qty 2

## 2015-02-02 MED ORDER — PROPOFOL 10 MG/ML IV BOLUS
INTRAVENOUS | Status: AC
  Filled 2015-02-02: qty 20

## 2015-02-02 MED ORDER — OXYCODONE HCL 5 MG PO TABS
ORAL_TABLET | ORAL | Status: AC
  Filled 2015-02-02: qty 1

## 2015-02-02 MED ORDER — ASPIRIN EC 325 MG PO TBEC: 325.0000 mg | DELAYED_RELEASE_TABLET | Freq: Two times a day (BID) | ORAL | Status: DC

## 2015-02-02 MED ORDER — LACTATED RINGERS IV SOLN: INTRAVENOUS | Status: DC

## 2015-02-02 MED ORDER — BUPIVACAINE HCL (PF) 0.5 % IJ SOLN
INTRAMUSCULAR | Status: DC | PRN
  Administered 2015-02-02: 3 mL

## 2015-02-02 MED ORDER — BISACODYL 5 MG PO TBEC: 5.0000 mg | DELAYED_RELEASE_TABLET | Freq: Every day | ORAL | Status: DC | PRN

## 2015-02-02 MED ORDER — METHOCARBAMOL 500 MG PO TABS
500.0000 mg | ORAL_TABLET | Freq: Four times a day (QID) | ORAL | Status: DC | PRN
  Administered 2015-02-02 – 2015-02-04 (×4): 500 mg via ORAL
  Filled 2015-02-02 (×4): qty 1

## 2015-02-02 MED ORDER — DOCUSATE SODIUM 100 MG PO CAPS
100.0000 mg | ORAL_CAPSULE | Freq: Two times a day (BID) | ORAL | Status: DC
  Administered 2015-02-02 – 2015-02-04 (×4): 100 mg via ORAL
  Filled 2015-02-02 (×5): qty 1

## 2015-02-02 MED ORDER — DIPHENHYDRAMINE HCL 12.5 MG/5ML PO ELIX: 12.5000 mg | ORAL_SOLUTION | ORAL | Status: DC | PRN

## 2015-02-02 MED ORDER — ACETAMINOPHEN 325 MG PO TABS
325.0000 mg | ORAL_TABLET | ORAL | Status: DC | PRN
Start: 2015-02-02 — End: 2015-02-02

## 2015-02-02 MED ORDER — EZETIMIBE 10 MG PO TABS
10.0000 mg | ORAL_TABLET | Freq: Every day | ORAL | Status: DC
  Administered 2015-02-02: 10 mg via ORAL
  Filled 2015-02-02 (×2): qty 1

## 2015-02-02 MED ORDER — METOCLOPRAMIDE HCL 5 MG/ML IJ SOLN: 5.0000 mg | Freq: Three times a day (TID) | INTRAMUSCULAR | Status: DC | PRN

## 2015-02-02 MED ORDER — PHENYLEPHRINE HCL 10 MG/ML IJ SOLN
10.0000 mg | INTRAVENOUS | Status: DC | PRN
  Administered 2015-02-02: 40 ug/min via INTRAVENOUS

## 2015-02-02 MED ORDER — PANTOPRAZOLE SODIUM 40 MG PO TBEC
40.0000 mg | DELAYED_RELEASE_TABLET | Freq: Every day | ORAL | Status: DC
  Administered 2015-02-03: 40 mg via ORAL
  Filled 2015-02-02: qty 1

## 2015-02-02 MED ORDER — METOCLOPRAMIDE HCL 5 MG PO TABS
5.0000 mg | ORAL_TABLET | Freq: Three times a day (TID) | ORAL | Status: DC | PRN
  Filled 2015-02-02: qty 2

## 2015-02-02 MED ORDER — TIZANIDINE HCL 2 MG PO CAPS: 2.0000 mg | ORAL_CAPSULE | Freq: Three times a day (TID) | ORAL | Status: DC

## 2015-02-02 MED ORDER — SODIUM CHLORIDE 0.9 % IV SOLN
1000.0000 mg | INTRAVENOUS | Status: AC
  Administered 2015-02-02: 1000 mg via INTRAVENOUS
  Filled 2015-02-02: qty 10

## 2015-02-02 MED ORDER — LACTATED RINGERS IV SOLN
INTRAVENOUS | Status: DC | PRN
  Administered 2015-02-02 (×2): via INTRAVENOUS

## 2015-02-02 MED ORDER — PROPOFOL INFUSION 10 MG/ML OPTIME
INTRAVENOUS | Status: DC | PRN
  Administered 2015-02-02: 25 ug/kg/min via INTRAVENOUS

## 2015-02-02 MED ORDER — ASPIRIN EC 325 MG PO TBEC
325.0000 mg | DELAYED_RELEASE_TABLET | Freq: Every day | ORAL | Status: DC
  Administered 2015-02-03 – 2015-02-04 (×2): 325 mg via ORAL
  Filled 2015-02-02 (×2): qty 1

## 2015-02-02 MED ORDER — ACETAMINOPHEN 160 MG/5ML PO SOLN
325.0000 mg | ORAL | Status: DC | PRN
Start: 2015-02-02 — End: 2015-02-02
  Filled 2015-02-02: qty 20.3

## 2015-02-02 MED ORDER — SENNOSIDES-DOCUSATE SODIUM 8.6-50 MG PO TABS: 1.0000 | ORAL_TABLET | Freq: Every evening | ORAL | Status: DC | PRN

## 2015-02-02 MED ORDER — BUPIVACAINE-EPINEPHRINE 0.5% -1:200000 IJ SOLN
INTRAMUSCULAR | Status: DC | PRN
  Administered 2015-02-02: 20 mL

## 2015-02-02 MED ORDER — MAGNESIUM CITRATE PO SOLN: 1.0000 | Freq: Once | ORAL | Status: AC | PRN

## 2015-02-02 MED ORDER — FENTANYL CITRATE 0.05 MG/ML IJ SOLN
INTRAMUSCULAR | Status: AC
  Filled 2015-02-02: qty 5

## 2015-02-02 MED ORDER — HYDROMORPHONE HCL 1 MG/ML IJ SOLN
0.2500 mg | INTRAMUSCULAR | Status: DC | PRN
  Administered 2015-02-02: 0.5 mg via INTRAVENOUS

## 2015-02-02 MED ORDER — OXYCODONE HCL 5 MG PO TABS
5.0000 mg | ORAL_TABLET | Freq: Once | ORAL | Status: AC | PRN
  Administered 2015-02-02: 5 mg via ORAL

## 2015-02-02 MED ORDER — FENTANYL CITRATE 0.05 MG/ML IJ SOLN
INTRAMUSCULAR | Status: DC | PRN
  Administered 2015-02-02 (×2): 50 ug via INTRAVENOUS

## 2015-02-02 MED ORDER — COLESEVELAM HCL 625 MG PO TABS
1875.0000 mg | ORAL_TABLET | Freq: Every day | ORAL | Status: DC
  Administered 2015-02-02: 1875 mg via ORAL
  Filled 2015-02-02 (×3): qty 3

## 2015-02-02 MED ORDER — BUPIVACAINE-EPINEPHRINE (PF) 0.5% -1:200000 IJ SOLN
INTRAMUSCULAR | Status: AC
  Filled 2015-02-02: qty 30

## 2015-02-02 MED ORDER — PHENOL 1.4 % MT LIQD: 1.0000 | OROMUCOSAL | Status: DC | PRN

## 2015-02-02 MED ORDER — METHOCARBAMOL 1000 MG/10ML IJ SOLN
500.0000 mg | Freq: Four times a day (QID) | INTRAVENOUS | Status: DC | PRN
  Filled 2015-02-02: qty 5

## 2015-02-02 MED ORDER — OXYCODONE-ACETAMINOPHEN 5-325 MG PO TABS: 1.0000 | ORAL_TABLET | ORAL | Status: DC | PRN

## 2015-02-02 MED ORDER — ONDANSETRON HCL 4 MG PO TABS: 4.0000 mg | ORAL_TABLET | Freq: Four times a day (QID) | ORAL | Status: DC | PRN

## 2015-02-02 MED ORDER — OXYCODONE HCL 5 MG PO TABS
5.0000 mg | ORAL_TABLET | ORAL | Status: DC | PRN
  Administered 2015-02-02 – 2015-02-04 (×8): 10 mg via ORAL
  Filled 2015-02-02 (×8): qty 2

## 2015-02-02 MED ORDER — OXYCODONE HCL 5 MG/5ML PO SOLN: 5.0000 mg | Freq: Once | ORAL | Status: AC | PRN

## 2015-02-02 MED ORDER — KCL IN DEXTROSE-NACL 20-5-0.45 MEQ/L-%-% IV SOLN
INTRAVENOUS | Status: DC
  Administered 2015-02-03: 06:00:00 via INTRAVENOUS
  Filled 2015-02-02 (×9): qty 1000

## 2015-02-02 MED ORDER — MENTHOL 3 MG MT LOZG: 1.0000 | LOZENGE | OROMUCOSAL | Status: DC | PRN

## 2015-02-02 MED ORDER — HYDROMORPHONE HCL 1 MG/ML IJ SOLN
INTRAMUSCULAR | Status: AC
  Filled 2015-02-02: qty 1

## 2015-02-02 MED ORDER — DEXTROSE-NACL 5-0.45 % IV SOLN
INTRAVENOUS | Status: DC
Start: 2015-02-02 — End: 2015-02-02

## 2015-02-02 MED ORDER — DEXTROSE 5 % IV SOLN
INTRAVENOUS | Status: DC | PRN
  Administered 2015-02-02: 10:00:00 via INTRAVENOUS

## 2015-02-02 MED ORDER — MIDAZOLAM HCL 5 MG/5ML IJ SOLN
INTRAMUSCULAR | Status: DC | PRN
  Administered 2015-02-02 (×2): 1 mg via INTRAVENOUS

## 2015-02-02 SURGICAL SUPPLY — 65 items
BLADE SAW SAG 73X25 THK (BLADE)
BLADE SAW SGTL 73X25 THK (BLADE) IMPLANT
BRUSH FEMORAL CANAL (MISCELLANEOUS) IMPLANT
CAPT HIP TOTAL 2 ×2 IMPLANT
COVER SURGICAL LIGHT HANDLE (MISCELLANEOUS) ×3 IMPLANT
DRAPE C-ARM 42X72 X-RAY (DRAPES) IMPLANT
DRAPE IMP U-DRAPE 54X76 (DRAPES) ×3 IMPLANT
DRAPE ORTHO SPLIT 77X108 STRL (DRAPES) ×3
DRAPE PROXIMA HALF (DRAPES) ×3 IMPLANT
DRAPE SURG ORHT 6 SPLT 77X108 (DRAPES) ×1 IMPLANT
DRAPE U-SHAPE 47X51 STRL (DRAPES) ×3 IMPLANT
DRILL BIT 7/64X5 (BIT) ×3 IMPLANT
DRSG AQUACEL AG ADV 3.5X10 (GAUZE/BANDAGES/DRESSINGS) ×3 IMPLANT
DRSG AQUACEL AG ADV 3.5X14 (GAUZE/BANDAGES/DRESSINGS) ×2 IMPLANT
DURAPREP 26ML APPLICATOR (WOUND CARE) ×3 IMPLANT
ELECT BLADE 4.0 EZ CLEAN MEGAD (MISCELLANEOUS)
ELECT BLADE 6.5 EXT (BLADE) IMPLANT
ELECT REM PT RETURN 9FT ADLT (ELECTROSURGICAL) ×3
ELECTRODE BLDE 4.0 EZ CLN MEGD (MISCELLANEOUS) IMPLANT
ELECTRODE REM PT RTRN 9FT ADLT (ELECTROSURGICAL) ×1 IMPLANT
EVACUATOR 1/8 PVC DRAIN (DRAIN) IMPLANT
GAUZE SPONGE 4X4 12PLY STRL (GAUZE/BANDAGES/DRESSINGS) ×3 IMPLANT
GAUZE XEROFORM 5X9 LF (GAUZE/BANDAGES/DRESSINGS) ×3 IMPLANT
GLOVE BIO SURGEON STRL SZ7.5 (GLOVE) ×3 IMPLANT
GLOVE BIO SURGEON STRL SZ8.5 (GLOVE) ×6 IMPLANT
GLOVE BIOGEL PI IND STRL 8 (GLOVE) ×2 IMPLANT
GLOVE BIOGEL PI IND STRL 9 (GLOVE) ×1 IMPLANT
GLOVE BIOGEL PI INDICATOR 8 (GLOVE) ×4
GLOVE BIOGEL PI INDICATOR 9 (GLOVE) ×2
GOWN STRL REUS W/ TWL LRG LVL3 (GOWN DISPOSABLE) ×1 IMPLANT
GOWN STRL REUS W/ TWL XL LVL3 (GOWN DISPOSABLE) ×2 IMPLANT
GOWN STRL REUS W/TWL LRG LVL3 (GOWN DISPOSABLE) ×3
GOWN STRL REUS W/TWL XL LVL3 (GOWN DISPOSABLE) ×6
HANDPIECE INTERPULSE COAX TIP (DISPOSABLE)
HOOD PEEL AWAY FACE SHEILD DIS (HOOD) ×6 IMPLANT
KIT BASIN OR (CUSTOM PROCEDURE TRAY) ×3 IMPLANT
KIT ROOM TURNOVER OR (KITS) ×3 IMPLANT
MANIFOLD NEPTUNE II (INSTRUMENTS) ×3 IMPLANT
NEEDLE 22X1 1/2 (OR ONLY) (NEEDLE) ×3 IMPLANT
NS IRRIG 1000ML POUR BTL (IV SOLUTION) ×6 IMPLANT
PACK TOTAL JOINT (CUSTOM PROCEDURE TRAY) ×3 IMPLANT
PACK UNIVERSAL I (CUSTOM PROCEDURE TRAY) ×3 IMPLANT
PAD ARMBOARD 7.5X6 YLW CONV (MISCELLANEOUS) ×6 IMPLANT
PASSER SUT SWANSON 36MM LOOP (INSTRUMENTS) IMPLANT
PRESSURIZER FEMORAL UNIV (MISCELLANEOUS) IMPLANT
SET HNDPC FAN SPRY TIP SCT (DISPOSABLE) IMPLANT
SLEEVE SURGEON STRL (DRAPES) IMPLANT
SPONGE GAUZE 4X4 12PLY STER LF (GAUZE/BANDAGES/DRESSINGS) ×2 IMPLANT
SPONGE LAP 18X18 X RAY DECT (DISPOSABLE) IMPLANT
STAPLER VISISTAT 35W (STAPLE) ×3 IMPLANT
SUT ETHIBOND 2 V 37 (SUTURE) ×3 IMPLANT
SUT VIC AB 0 CTB1 27 (SUTURE) ×3 IMPLANT
SUT VIC AB 1 CTX 36 (SUTURE) ×3
SUT VIC AB 1 CTX36XBRD ANBCTR (SUTURE) ×1 IMPLANT
SUT VIC AB 2-0 CTB1 (SUTURE) ×3 IMPLANT
SUT VIC AB 3-0 CT1 27 (SUTURE) ×6
SUT VIC AB 3-0 CT1 TAPERPNT 27 (SUTURE) ×1 IMPLANT
SYR 20ML ECCENTRIC (SYRINGE) ×3 IMPLANT
SYR CONTROL 10ML LL (SYRINGE) ×3 IMPLANT
TOWEL OR 17X24 6PK STRL BLUE (TOWEL DISPOSABLE) ×3 IMPLANT
TOWEL OR 17X26 10 PK STRL BLUE (TOWEL DISPOSABLE) ×3 IMPLANT
TOWER CARTRIDGE SMART MIX (DISPOSABLE) IMPLANT
TRAY FOLEY CATH 14FR (SET/KITS/TRAYS/PACK) IMPLANT
TUBE ANAEROBIC SPECIMEN COL (MISCELLANEOUS) ×3 IMPLANT
WATER STERILE IRR 1000ML POUR (IV SOLUTION) ×9 IMPLANT

## 2015-02-02 NOTE — Anesthesia Postprocedure Evaluation (Signed)
  Anesthesia Post-op Note  Patient: [name]  Procedure(s) Performed: Procedure(s): TOTAL HIP REVISION (Right)  Patient Location: PACU  Anesthesia Type:Spinal  Level of Consciousness: awake  Airway and Oxygen Therapy: Patient Spontanous Breathing  Post-op Pain: none  Post-op Assessment: Post-op Vital signs reviewed, Patient's Cardiovascular Status Stable, Respiratory Function Stable, Patent Airway, No signs of Nausea or vomiting and Pain level controlled  Post-op Vital Signs: Reviewed and stable  Last Vitals:  Filed Vitals:   02/02/15 1330  BP:   Pulse: 54  Temp:   Resp: 16    Complications: No apparent anesthesia complications

## 2015-02-02 NOTE — Plan of Care (Signed)
Problem: Consults Goal: Diagnosis- Total Joint Replacement Revision Total Hip Right

## 2015-02-02 NOTE — Transfer of Care (Signed)
Immediate Anesthesia Transfer of Care Note  Patient: [name]  Procedure(s) Performed: Procedure(s): TOTAL HIP REVISION (Right)  Patient Location: PACU  Anesthesia Type:Spinal  Level of Consciousness: awake, sedated and patient cooperative  Airway & Oxygen Therapy: Patient Spontanous Breathing and Patient connected to nasal cannula oxygen  Post-op Assessment: Report given to RN and Post -op Vital signs reviewed and stable  Post vital signs: Reviewed and stable  Last Vitals:  Filed Vitals:   02/02/15 1152  BP: 110/68  Pulse: 63  Temp: 36.4 C  Resp: 17    Complications: No apparent anesthesia complications

## 2015-02-02 NOTE — Op Note (Signed)
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  Surgeon: Kathalene Frames. Mayer Camel M.D.  Assistant: Kerry Hough. Barton Dubois  (present throughout entire procedure and necessary for timely completion of the procedure)  Estimated blood loss: 400 cc  Fluid replacement: 1800 cc of crystalloid  Complications: None  Indications: Patient with an ASR on S-ROM total hip that did very well until a few months ago when he had increasing groin pain. The pain wakes him up at night and recently got to the point where he could no longer go walking on a regular basis. MRI scan showed fluid collection, but no bony destruction. Plain x-rays show no change in the position of the components and the stem appears to be well ingrown. Risks and benefits of revision surgery have been discussed and questions answered.  Procedure: Patient was identified by arm band receive preoperative IV antibiotics in the holding area at, and hospital. He was then taken to the operating room where the appropriate anesthetic monitors were attached and general endotracheal anesthesia induced with the patient in the supine position. He was then rolled into the L lateral decubitus position and fixed there with a mark 2 pelvic clamp. A Foley catheter was inserted and the limb prepped and draped in usual sterile fashion from the ankle to the hemipelvis. Time out procedure performed. Skin along the lateral hip and thigh infiltrated with 10 cc of 1/2% Marcaine and epinephrine solution. We began the operation by recreating the old posterior lateral incision 15 cm in line through the skin and subcutaneous tissue down to the level of the IT band which was cut in line with the skin incision. This  exposed the post capsule, incised yielding clear serosanginous fluid. We then removed scar tissue from around to the ASR cup and femoral stem trunnion, dislocated a total hip and removed the ASR head with a mallet and metal cylinder. The stem was then removed by breaking the seal between the stem and sleeve with a large screwdriver and extracting the stem with the slap hammer. We continued to remove scar tissue from around the acetabular component.  We began loosening the cup by placing a 1/4 inch osteotome between the edge of the acetabular component and the bone. We then used the short Innomed curved osteotome around the edge of the cup and at that point it came out relatively easily indicating no and growth. Very little bone was noticed on the ingrowth surface of the cup and fibrous tissue is then stripped from the acetabulum. We reamed up to a 57 mm basket reamer obtaining good coverage in all quadrants irrigated with normal saline solution. We then hammered into place a 58 mm Pinnacle cup in 45 of abduction and 20 of anteversion. A central occluder was screwed into place followed by a 10 polyethylene liner index posterior and superior. A trial reduction was then performed with a 20 x 15 x 1 50 x 36+8 stem with a +6 mm femoral head instability was noted to 90 of flexion 60 of internal rotation and in full extension the hip could not be dislocated with external rotation. At this point a real 20 x 15 x 42 x 1 50+8 stem was hammered into place with a 20  of anteversion in relation to the knee, and a +6 36 mm ceramic head was hammered into place, the hip reduced and irrigated with normal saline solution. The capsular flap was repaired back to the intertrochanteric crest through drill holes with #2 Ethibond suture. We then closed the IT band with running #1 Vicryl suture, the subcutaneous tissue with 0 and 2-0 undyed Vicryl suture, the skin was closed with running interlocking 3-0 nylon suture. A dressing of  Aquacil was then applied, the patient was unclamped a rolled supine awakened extubated and taken to the recovery without difficulty.

## 2015-02-02 NOTE — Discharge Instructions (Signed)
Total Hip Replacement, Care After °Refer to this sheet in the next few weeks. These instructions provide you with information on caring for yourself after your procedure. Your health care provider may also give you specific instructions. Your treatment has been planned according to the most current medical practices, but problems sometimes occur. Call your health care provider if you have any problems or questions after your procedure. °HOME CARE INSTRUCTIONS  °Your health care provider will give you specific precautions for certain types of movement. Additional instructions include: °· Take medicines only as directed by your health care provider. °· Take quick showers (3-5 min) rather than bathe until your health care provider tells you that you can take baths again. °· Avoid lifting until your health care provider instructs you otherwise. °· Use a raised toilet seat and avoid sitting in low chairs as instructed by your health care provider. °· Use crutches or a walker as instructed by your health care provider. °SEEK MEDICAL CARE IF: °· You have difficulty breathing. °· You have drainage, redness, or swelling at your incision site. °· You have a bad smell coming from your incision site. °· You have persistent bleeding from your incision site. °· Your incision breaks open after sutures (stitches) or staples have been removed. °· You have a fever. °SEEK IMMEDIATE MEDICAL CARE IF:  °· You have a rash. °· You have pain or swelling in your calf or thigh. °· You have shortness of breath or chest pain. °MAKE SURE YOU: °· Understand these instructions. °· Will watch your condition. °· Will get help if you are not doing well or get worse. °Document Released: 06/24/2005 Document Revised: 04/21/2014 Document Reviewed: 02/05/2014 °[employer]® Patient Information ©2015 [employer]. This information is not intended to replace advice given to you by your health care provider. Make sure you discuss any questions you have with  your health care provider. ° °

## 2015-02-02 NOTE — Anesthesia Procedure Notes (Signed)
Spinal Patient location during procedure: OB Staffing Anesthesiologist: Delfin Squillace, CHRIS Preanesthetic Checklist Completed: patient identified, surgical consent, pre-op evaluation, timeout performed, IV checked, risks and benefits discussed and monitors and equipment checked Spinal Block Patient position: sitting Prep: site prepped and draped and DuraPrep Patient monitoring: heart rate, cardiac monitor, continuous pulse ox and blood pressure Approach: midline Location: L3-4 Injection technique: single-shot Needle Needle type: Pencan  Needle gauge: 24 G Needle length: 10 cm Assessment Sensory level: T6

## 2015-02-02 NOTE — Interval H&P Note (Signed)
History and Physical Interval Note:  02/02/2015 7:28 AM  [name]  has presented today for surgery, with the diagnosis of LOOSE RIGHT TOTAL HIP ARTHROPLASTY  The various methods of treatment have been discussed with the patient and family. After consideration of risks, benefits and other options for treatment, the patient has consented to  Procedure(s): TOTAL HIP REVISION (Right) as a surgical intervention .  [name] history has been reviewed, patient examined, no change in status, stable for surgery.  I have reviewed the patient's chart and labs.  Questions were answered to the patient's satisfaction.     Kerin Salen

## 2015-02-02 NOTE — Progress Notes (Signed)
Pt. With spinal preop, not able to wiggle toes or feel sensation at this time

## 2015-02-02 NOTE — Anesthesia Preprocedure Evaluation (Addendum)
Anesthesia Evaluation  Patient identified by MRN, date of birth, ID band Patient awake    Reviewed: Allergy & Precautions, NPO status , Patient's Chart, lab work & pertinent test results  History of Anesthesia Complications Negative for: history of anesthetic complications  Airway Mallampati: I  TM Distance: >3 FB Neck ROM: Full    Dental  (+) Edentulous Upper, Partial Lower,    Pulmonary neg shortness of breath, neg sleep apnea, neg COPDneg recent URI, former smoker,  breath sounds clear to auscultation        Cardiovascular negative cardio ROS  Rhythm:Regular     Neuro/Psych negative neurological ROS  negative psych ROS   GI/Hepatic Neg liver ROS, GERD-  Medicated and Controlled,  Endo/Other  negative endocrine ROS  Renal/GU negative Renal ROS     Musculoskeletal  (+) Arthritis -,   Abdominal   Peds  Hematology negative hematology ROS (+)   Anesthesia Other Findings Dental appliances out to labelled cup to wife.  Eyeglasses to wife.  Reproductive/Obstetrics                           Anesthesia Physical Anesthesia Plan  ASA: II  Anesthesia Plan: Spinal and MAC   Post-op Pain Management:    Induction:   Airway Management Planned: Natural Airway and Simple Face Mask  Additional Equipment: None  Intra-op Plan:   Post-operative Plan:   Informed Consent: I have reviewed the patients History and Physical, chart, labs and discussed the procedure including the risks, benefits and alternatives for the proposed anesthesia with the patient or authorized representative who has indicated his/her understanding and acceptance.   Dental advisory given  Plan Discussed with: CRNA and Surgeon  Anesthesia Plan Comments:         Anesthesia Quick Evaluation

## 2015-02-03 ENCOUNTER — Encounter (HOSPITAL_COMMUNITY): Payer: Self-pay | Admitting: Orthopedic Surgery

## 2015-02-03 LAB — BASIC METABOLIC PANEL
Anion gap: 10 (ref 5–15)
BUN: 15 mg/dL (ref 6–23)
CO2: 21 mmol/L (ref 19–32)
CREATININE: 1.05 mg/dL (ref 0.50–1.35)
Calcium: 8.8 mg/dL (ref 8.4–10.5)
Chloride: 102 mmol/L (ref 96–112)
GFR calc non Af Amer: 69 mL/min — ABNORMAL LOW (ref >90)
GFR, EST AFRICAN AMERICAN: 80 mL/min — AB (ref >90)
Glucose, Bld: 126 mg/dL — ABNORMAL HIGH (ref 70–99)
Potassium: 4.3 mmol/L (ref 3.5–5.1)
Sodium: 133 mmol/L — ABNORMAL LOW (ref 135–145)

## 2015-02-03 LAB — CBC
HCT: 38.5 % — ABNORMAL LOW (ref 39.0–52.0)
Hemoglobin: 13 g/dL (ref 13.0–17.0)
MCH: 32.3 pg (ref 26.0–34.0)
MCHC: 33.8 g/dL (ref 30.0–36.0)
MCV: 95.8 fL (ref 78.0–100.0)
PLATELETS: 175 10*3/uL (ref 150–400)
RBC: 4.02 MIL/uL — ABNORMAL LOW (ref 4.22–5.81)
RDW: 13.2 % (ref 11.5–15.5)
WBC: 9 10*3/uL (ref 4.0–10.5)

## 2015-02-03 NOTE — Evaluation (Addendum)
Physical Therapy Evaluation Patient Details Name: [name] MRN: [id number] DOB: Apr 01, 1941 Today's Date: 02/03/2015   History of Present Illness  Pt s/p R total hip revision  Clinical Impression  Pt admitted with above diagnosis. Pt currently with functional limitations due to the deficits listed below (see PT Problem List).  Pt will benefit from skilled PT to increase their independence and safety with mobility to allow discharge to the venue listed below.  Pt moving well once up with bed mobility being the most difficult.  Pt should progress well to d/c home with HHPT.  Pt has all DME he needs.     Follow Up Recommendations Home health PT    Equipment Recommendations  None recommended by PT    Recommendations for Other Services       Precautions / Restrictions Precautions Precautions: Posterior Hip Precaution Booklet Issued: Yes (comment) Precaution Comments: Reviewed precautions Restrictions Weight Bearing Restrictions: Yes RLE Weight Bearing: Weight bearing as tolerated      Mobility  Bed Mobility Overal bed mobility: Needs Assistance Bed Mobility: Supine to Sit     Supine to sit: Mod assist     General bed mobility comments: cues to maintain hip precautions and proper hand placement  Transfers Overall transfer level: Needs assistance Equipment used: Rolling walker (2 wheeled) Transfers: Sit to/from Stand Sit to Stand: Min assist;From elevated surface         General transfer comment: cues for proper technique  Ambulation/Gait Ambulation/Gait assistance: Min guard Ambulation Distance (Feet): 60 Feet Assistive device: Rolling walker (2 wheeled) Gait Pattern/deviations: Decreased stance time - right;Step-to pattern     General Gait Details: Pt with good sequencing and WB.  Gait session stopped due to IV leaking.  Nursing informed.  Stairs            Wheelchair Mobility    Modified Rankin (Stroke Patients Only)       Balance  Overall balance assessment: Needs assistance   Sitting balance-Leahy Scale: Fair       Standing balance-Leahy Scale: Poor Standing balance comment: requires RW due to pain                             Pertinent Vitals/Pain Pain Assessment: 0-10 Pain Score: 5  Pain Location: R hip Pain Descriptors / Indicators: Aching;Operative site guarding Pain Intervention(s): Limited activity within patient's tolerance;Patient requesting pain meds-RN notified    Home Living Family/patient expects to be discharged to:: Private residence Living Arrangements: Spouse/significant other Available Help at Discharge: Family;Available 24 hours/day Type of Home: House Home Access: Stairs to enter Entrance Stairs-Rails: Left;Right;Can reach both Entrance Stairs-Number of Steps: 2 Home Layout: One level Home Equipment: Bedside commode;Walker - 2 wheels;Adaptive equipment;Shower seat      Prior Function Level of Independence: Independent         Comments: Retired in October     Hand Dominance   Dominant Hand: Right    Extremity/Trunk Assessment   Upper Extremity Assessment: Defer to OT evaluation           Lower Extremity Assessment: RLE deficits/detail RLE Deficits / Details: R LE ROM limited by post-operative pain, but gives good effort with ROM ther ex.  ROM 50% of normal limits.    Cervical / Trunk Assessment: Normal  Communication   Communication: No difficulties  Cognition Arousal/Alertness: Awake/alert Behavior During Therapy: WFL for tasks assessed/performed Overall Cognitive Status: Within Functional Limits for tasks assessed  General Comments      Exercises Total Joint Exercises Ankle Circles/Pumps: AROM;10 reps;Supine Quad Sets: Supine;10 reps Heel Slides: AROM;Right;10 reps;Supine Hip ABduction/ADduction: AROM;Right;5 reps;Supine      Assessment/Plan    PT Assessment Patient needs continued PT services  PT  Diagnosis Difficulty walking;Generalized weakness   PT Problem List Decreased range of motion;Decreased strength;Decreased balance;Decreased mobility;Decreased knowledge of use of DME  PT Treatment Interventions Gait training;Stair training;Functional mobility training;Therapeutic exercise;Therapeutic activities;DME instruction   PT Goals (Current goals can be found in the Care Plan section) Acute Rehab PT Goals Patient Stated Goal: To go home and get better before Spring PT Goal Formulation: With patient Time For Goal Achievement: 02/10/15 Potential to Achieve Goals: Good    Frequency 7X/week   Barriers to discharge        Co-evaluation               End of Session Equipment Utilized During Treatment: Gait belt Activity Tolerance: Patient tolerated treatment well Patient left: in chair;with call bell/phone within reach Nurse Communication: Mobility status;Patient requests pain meds         Time: 2536-6440 PT Time Calculation (min) (ACUTE ONLY): 37 min   Charges:   PT Evaluation $Initial PT Evaluation Tier I: 1 Procedure PT Treatments $Gait Training: 8-22 mins   PT G Codes:        Ivie Maese LUBECK 02/03/2015, 9:36 AM

## 2015-02-03 NOTE — Progress Notes (Signed)
Physical Therapy Treatment Patient Details Name: [name] MRN: [id number] DOB: 11-Dec-1941 Today's Date: 02/03/2015    History of Present Illness Pt s/p R total hip revision    PT Comments    Continuing progress with functional mobility, with increased amb distance and very good muscle activation with R hip AROM; dc home tomorrow is not unreasonable given very good progress with mobility  Follow Up Recommendations  Home health PT     Equipment Recommendations  None recommended by PT    Recommendations for Other Services       Precautions / Restrictions Precautions Precautions: Posterior Hip Precaution Booklet Issued: Yes (comment) Precaution Comments: Reviewed precautions, handout given Restrictions Weight Bearing Restrictions: Yes RLE Weight Bearing: Weight bearing as tolerated    Mobility  Bed Mobility               General bed mobility comments: Patient seated in recliner upon OT entering room  Transfers Overall transfer level: Needs assistance Equipment used: Rolling walker (2 wheeled) Transfers: Sit to/from Stand Sit to Stand: Supervision         General transfer comment: cues for proper technique  Ambulation/Gait Ambulation/Gait assistance: Supervision Ambulation Distance (Feet): 120 Feet Assistive device: Rolling walker (2 wheeled) Gait Pattern/deviations: Step-through pattern (emerging) Gait velocity: decr   General Gait Details: Continued good sequencing; cues for observation of Post hip prec with turns   Stairs Stairs:  (pt requested to defer to next session)          Wheelchair Mobility    Modified Rankin (Stroke Patients Only)       Balance Overall balance assessment: Needs assistance Sitting-balance support: No upper extremity supported;Feet supported Sitting balance-Leahy Scale: Fair     Standing balance support: Bilateral upper extremity supported;During functional activity Standing balance-Leahy Scale: Fair                       Cognition Arousal/Alertness: Awake/alert Behavior During Therapy: WFL for tasks assessed/performed Overall Cognitive Status: Within Functional Limits for tasks assessed                      Exercises Total Joint Exercises Quad Sets: AROM;Right;10 reps Gluteal Sets: AROM;Both;10 reps Towel Squeeze: AROM;Both;10 reps Short Arc Quad: AROM;Right;10 reps Heel Slides: AROM;Right;10 reps;Supine Hip ABduction/ADduction: AROM;Right;10 reps Knee Flexion: AROM;Right;10 reps    General Comments        Pertinent Vitals/Pain Pain Assessment: 0-10 Pain Score: 4  Pain Location: R hip Pain Descriptors / Indicators: Aching;Operative site guarding Pain Intervention(s): Monitored during session    Home Living Family/patient expects to be discharged to:: Private residence Living Arrangements: Spouse/significant other Available Help at Discharge: Family;Available 24 hours/day Type of Home: House Home Access: Stairs to enter Entrance Stairs-Rails: Left;Right;Can reach both Home Layout: One level Home Equipment: Bedside commode;Walker - 2 wheels;Adaptive equipment;Shower seat      Prior Function Level of Independence: Independent      Comments: Retired in October   PT Goals (current goals can now be found in the care plan section) Acute Rehab PT Goals Patient Stated Goal: to go home PT Goal Formulation: With patient Time For Goal Achievement: 02/10/15 Potential to Achieve Goals: Good Progress towards PT goals: Progressing toward goals    Frequency  7X/week    PT Plan Current plan remains appropriate    Co-evaluation             End of Session Equipment Utilized During Treatment: Gait belt  Activity Tolerance: Patient tolerated treatment well Patient left: in chair;with call bell/phone within reach     Time: 1150-1211 PT Time Calculation (min) (ACUTE ONLY): 21 min  Charges:  $Gait Training: 8-22 mins                    G Codes:       [name] 02/03/2015, 2:33 PM  [name], [name] Pager [phone] Office [phone]

## 2015-02-03 NOTE — Progress Notes (Signed)
Utilization Review Completed.[name] T2/16/2016  

## 2015-02-03 NOTE — Evaluation (Signed)
Occupational Therapy Evaluation Patient Details Name: [name] MRN: [id number] DOB: March 07, 1941 Today's Date: 02/03/2015    History of Present Illness Pt s/p R total hip revision   Clinical Impression   Patient independent PTA. Patient currently functioning at an overall supervision>min assist level. Patient will benefit from acute OT to increase overall independence in the areas of ADLs, functional mobility, and overall safety in order to safely discharge home with wife.     Follow Up Recommendations  No OT follow up;Supervision/Assistance - 24 hour    Equipment Recommendations  None recommended by OT    Recommendations for Other Services  None at this time     Precautions / Restrictions Precautions Precautions: Posterior Hip Precaution Booklet Issued: Yes (comment) Precaution Comments: Reviewed precautions, handout given Restrictions Weight Bearing Restrictions: Yes RLE Weight Bearing: Weight bearing as tolerated      Mobility Bed Mobility Overal bed mobility: Needs Assistance - Per PT eval Bed Mobility: Supine to Sit     Supine to sit: Mod assist - Per PT eval     General bed mobility comments: Patient seated in recliner upon OT entering room  Transfers Overall transfer level: Needs assistance Equipment used: Rolling walker (2 wheeled) Transfers: Sit to/from Stand Sit to Stand: Supervision         General transfer comment: cues for proper technique    Balance Overall balance assessment: Needs assistance Sitting-balance support: No upper extremity supported;Feet supported Sitting balance-Leahy Scale: Fair     Standing balance support: Bilateral upper extremity supported;During functional activity Standing balance-Leahy Scale: Poor Standing balance comment: requires RW due to pain     ADL Overall ADL's : Needs assistance/impaired Eating/Feeding: Independent   Grooming: Supervision/safety;Standing   Upper Body Bathing: Set up;Sitting    Lower Body Bathing: Minimal assistance;Sit to/from stand;Cueing for safety;Adhering to hip precautions;With adaptive equipment   Upper Body Dressing : Set up;Sitting   Lower Body Dressing: Minimal assistance;Cueing for safety;Sit to/from stand;Adhering to hip precautions   Toilet Transfer: Supervision/safety;BSC;RW;Ambulation           Functional mobility during ADLs: Supervision/safety;Rolling walker;Cueing for safety General ADL Comments: Verbally educated patient on use of AE, patient states he has a reahcer, sock aid, LH shoe horn, at home. Recommended patient purchase LH sponge for LB bathing. Patient overall supervision for functional mobility/transfers using RW, required min verbal cues for safety. Discussed shower stall transfer technique and plan to practice this tomorrow.     Pertinent Vitals/Pain Pain Assessment: No/denies pain (no pain in sitting)     Hand Dominance Right   Extremity/Trunk Assessment Upper Extremity Assessment Upper Extremity Assessment: Overall WFL for tasks assessed   Lower Extremity Assessment Lower Extremity Assessment: Defer to PT evaluation RLE Deficits / Details: R LE ROM limited by post-operative pain, but gives good effort with ROM ther ex.  ROM 50% of normal limits. RLE: Unable to fully assess due to pain   Cervical / Trunk Assessment Cervical / Trunk Assessment: Normal   Communication Communication Communication: No difficulties   Cognition Arousal/Alertness: Awake/alert Behavior During Therapy: WFL for tasks assessed/performed Overall Cognitive Status: Within Functional Limits for tasks assessed             Home Living Family/patient expects to be discharged to:: Private residence Living Arrangements: Spouse/significant other Available Help at Discharge: Family;Available 24 hours/day Type of Home: House Home Access: Stairs to enter [employer] of Steps: 2 Entrance Stairs-Rails: Left;Right;Can reach both Home  Layout: One level  Bathroom Shower/Tub: Industrial/product designer: Handicapped height     Home Equipment: Bedside commode;Walker - 2 wheels;Adaptive equipment;Shower seat Adaptive Equipment: Reacher;Sock aid;Long-handled shoe horn        Prior Functioning/Environment Level of Independence: Independent        Comments: Retired in October    OT Diagnosis: Generalized weakness;Acute pain   OT Problem List: Decreased strength;Decreased range of motion;Decreased activity tolerance;Impaired balance (sitting and/or standing);Decreased coordination;Decreased knowledge of use of DME or AE;Decreased safety awareness;Decreased knowledge of precautions;Pain   OT Treatment/Interventions: Self-care/ADL training;Therapeutic exercise;Energy conservation;DME and/or AE instruction;Therapeutic activities;Patient/family education;Balance training    OT Goals(Current goals can be found in the care plan section) Acute Rehab OT Goals Patient Stated Goal: to go home OT Goal Formulation: With patient Time For Goal Achievement: 02/10/15 Potential to Achieve Goals: Good ADL Goals Pt Will Perform Lower Body Bathing: with modified independence;with adaptive equipment;sit to/from stand Pt Will Perform Lower Body Dressing: with modified independence;with adaptive equipment;sit to/from stand Pt Will Transfer to Toilet: with modified independence;ambulating;bedside commode Pt Will Perform Toileting - Clothing Manipulation and hygiene: with modified independence;sit to/from stand Pt Will Perform Tub/Shower Transfer: Shower transfer;with modified independence;shower seat;ambulating;rolling walker  OT Frequency: Min 2X/week   Barriers to D/C: None known at this time          End of Session Equipment Utilized During Treatment: Rolling walker  Activity Tolerance: Patient tolerated treatment well Patient left: in chair;with call bell/phone within reach;with family/visitor present   Time:  1120-1141 OT Time Calculation (min): 21 min Charges:  OT General Charges $OT Visit: 1 Procedure OT Evaluation $Initial OT Evaluation Tier I: 1 Procedure  Karisha Marlin , MS, OTR/L, CLT Pager: [phone]  02/03/2015, 11:52 AM

## 2015-02-03 NOTE — Progress Notes (Signed)
Patient ID: [name], male   DOB: Jul 25, 1941, 74 y.o.   MRN: [id number] PATIENT ID: [name]  MRN: [id number]  DOB/AGE:  Jun 27, 1941 / 74 y.o.  1 Day Post-Op Procedure(s) (LRB): TOTAL HIP REVISION (Right)    PROGRESS NOTE Subjective: Patient is alert, oriented, no Nausea, no Vomiting, yes passing gas, no Bowel Movement. Taking PO well. Denies SOB, Chest or Calf Pain. Using Incentive Spirometer, PAS in place. Ambulate WBAT Patient reports pain as 3 on 0-10 scale  .    Objective: Vital signs in last 24 hours: Filed Vitals:   02/02/15 2148 02/03/15 0138 02/03/15 0400 02/03/15 0557  BP: 121/60 100/76  118/68  Pulse: 82 74  74  Temp: 98.8 F (37.1 C) 98.6 F (37 C)  97.7 F (36.5 C)  TempSrc:      Resp: 16 16 16 16   Weight:      SpO2: 97% 98% 98% 96%      Intake/Output from previous day: I/O last 3 completed shifts: In: 0947 [P.O.:240; I.V.:1350] Out: 1000 [Urine:900; Blood:100]   Intake/Output this shift:     LABORATORY DATA: No results for input(s): WBC, HGB, HCT, PLT, NA, K, CL, CO2, BUN, CREATININE, GLUCOSE, GLUCAP, INR, CALCIUM in the last 72 hours.  Invalid input(s): PT, 2  Examination: Neurologically intact ABD soft Neurovascular intact Sensation intact distally Intact pulses distally Dorsiflexion/Plantar flexion intact Incision: scant drainage No cellulitis present Compartment soft} XR AP&Lat of hip shows well placed\fixed THA  Assessment:   1 Day Post-Op Procedure(s) (LRB): TOTAL HIP REVISION (Right) ADDITIONAL DIAGNOSIS:  Expected Acute Blood Loss Anemia,   Plan: PT/OT WBAT, THA  posterior precautions  DVT Prophylaxis: SCDx72 hrs, ASA 325 mg BID x 2 weeks  DISCHARGE PLAN: Home, prob tomorrow  DISCHARGE NEEDS: HHPT, CPM, Walker and 3-in-1 comode seat

## 2015-02-04 LAB — CBC
HEMATOCRIT: 37.9 % — AB (ref 39.0–52.0)
HEMOGLOBIN: 12.7 g/dL — AB (ref 13.0–17.0)
MCH: 31.9 pg (ref 26.0–34.0)
MCHC: 33.5 g/dL (ref 30.0–36.0)
MCV: 95.2 fL (ref 78.0–100.0)
Platelets: 176 10*3/uL (ref 150–400)
RBC: 3.98 MIL/uL — AB (ref 4.22–5.81)
RDW: 13 % (ref 11.5–15.5)
WBC: 9 10*3/uL (ref 4.0–10.5)

## 2015-02-04 NOTE — Discharge Summary (Signed)
Patient ID: [name] MRN: [id number] DOB/AGE: 1941-01-28 74 y.o.  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 Arthritis   . Barrett's esophagus   . Hyperlipidemia     takes Zetia and Welchol daily  . Muscle spasm     takes Robaxin daily as needed  . GERD (gastroesophageal reflux disease)     takes Protonix daily  . Joint pain   . History of colon polyps     Surgeries: Procedure(s): TOTAL HIP REVISION on 02/02/2015   Consultants:    Discharged Condition: Improved  Hospital Course: [name] is an 74 y.o. male who was admitted 02/02/2015 for operative treatment of<principal problem not specified>. Patient has severe unremitting pain that affects sleep, daily activities, and work/hobbies. After pre-op clearance the patient was taken to the operating room on 02/02/2015 and underwent  Procedure(s): TOTAL HIP REVISION.    Patient was given perioperative antibiotics: Anti-infectives    Start     Dose/Rate Route Frequency Ordered Stop   02/02/15 0600  ceFAZolin (ANCEF) IVPB 2 g/50 mL premix     2 g 100 mL/hr over 30 Minutes Intravenous On call to O.R. 02/01/15 1412 02/02/15 1005       Patient was given sequential compression devices, early ambulation, and chemoprophylaxis to prevent DVT.  Patient benefited maximally from hospital stay and there were no complications.    Recent vital signs: Patient Vitals for the past 24 hrs:  BP Temp Temp src Pulse Resp SpO2  02/04/15 0554 (!) 101/58 mmHg 99.4 F (37.4 C) - 87 16 96 %  02/04/15 0351 - - - - 16 97 %  02/04/15 0000 - 98.7 F (37.1 C) Oral - 16 97 %  02/03/15 2018 118/65 mmHg 100.2 F (37.9 C) Oral 92 16 97 %  02/03/15 1300 138/61 mmHg 97.4 F (36.3 C) - 92 16 96 %     Recent laboratory studies:   Recent Labs  02/03/15 0650 02/04/15 0549  WBC 9.0 9.0  HGB 13.0 12.7*  HCT 38.5* 37.9*  PLT 175 176  NA 133*  --   K 4.3  --   CL 102  --   CO2 21  --   BUN 15  --   CREATININE 1.05  --   GLUCOSE 126*  --   CALCIUM 8.8  --      Discharge Medications:     Medication List    STOP taking these medications        methocarbamol 500 MG tablet  Commonly known as:  ROBAXIN      TAKE these medications        aspirin EC 81 MG tablet  Take 81 mg by mouth daily after supper.     aspirin EC 325 MG tablet  Take 1 tablet (325 mg total) by mouth 2 (two) times daily.     colesevelam 625 MG tablet  Commonly known as:  WELCHOL  Take 1,875 mg by mouth daily after supper.     ezetimibe 10 MG tablet  Commonly known as:  ZETIA  Take 10 mg by mouth daily after supper.     ibuprofen 200 MG tablet  Commonly known as:  ADVIL,MOTRIN  Take 400 mg by mouth every 6 (six) hours as needed (pain).  oxyCODONE-acetaminophen 5-325 MG per tablet  Commonly known as:  ROXICET  Take 1 tablet by mouth every 4 (four) hours as needed.     pantoprazole 40 MG tablet  Commonly known as:  PROTONIX  Take 40 mg by mouth daily after supper.     tizanidine 2 MG capsule  Commonly known as:  ZANAFLEX  Take 1 capsule (2 mg total) by mouth 3 (three) times daily.        Diagnostic Studies: Dg Chest 2 View  01/22/2015   CLINICAL DATA:  Preoperative examination  EXAM: CHEST  2 VIEW  COMPARISON:  PA chest x-ray dated May 07, 2013 and PA and lateral chest x-ray of Apr 26, 2013.  FINDINGS: The lungs are mildly hyperinflated. The interstitial markings are coarse but stable. The heart and pulmonary vascularity are normal. The mediastinum is normal in width. There is no pleural effusion. The bony thorax exhibits no acute abnormality.  IMPRESSION: COPD. Chronically increased interstitial markings likely reflect the patient's smoking history. There is no active cardiopulmonary disease.   Electronically Signed   By:  David  [place]   On: 01/22/2015 14:20   Dg Pelvis Portable  02/02/2015   CLINICAL DATA:  Post RIGHT total hip arthroplasty  EXAM: PORTABLE PELVIS 1-2 VIEWS  COMPARISON:  Portable exam 1607 hours compared to 05/07/2013  FINDINGS: BILATERAL hip prostheses identified.  Bones demineralized.  No acute fracture or dislocation.  Postsurgical changes within the soft tissues in the RIGHT hip region.  No definite acute bony abnormalities identified.  IMPRESSION: BILATERAL hip prostheses.  No acute osseous abnormalities.   Electronically Signed   By: Ulyses Southward M.D.   On: 02/02/2015 16:40    Disposition: 01-Home or Self Care      Discharge Instructions    Call MD / Call 911    Complete by:  As directed   If you experience chest pain or shortness of breath, CALL 911 and be transported to the hospital emergency room.  If you develope a fever above 101 F, pus (white drainage) or increased drainage or redness at the wound, or calf pain, call your surgeon's office.     Change dressing    Complete by:  As directed   You may change your dressing on day 5, then change the dressing daily with sterile 4 x 4 inch gauze dressing and paper tape.  You may clean the incision with alcohol prior to redressing     Constipation Prevention    Complete by:  As directed   Drink plenty of fluids.  Prune juice may be helpful.  You may use a stool softener, such as Colace (over the counter) 100 mg twice a day.  Use MiraLax (over the counter) for constipation as needed.     Diet - low sodium heart healthy    Complete by:  As directed      Discharge instructions    Complete by:  As directed   Follow up in office with Dr. Turner Daniels in 2 weeks.     Driving restrictions    Complete by:  As directed   No driving for 2 weeks     Follow the hip precautions as taught in Physical Therapy    Complete by:  As directed      Increase activity slowly as tolerated    Complete by:  As directed      Patient may shower    Complete by:  As  directed   You may shower without  a dressing once there is no drainage.  Do not wash over the wound.  If drainage remains, cover wound with plastic wrap and then shower.           Follow-up Information    Follow up with Nestor Lewandowsky, MD In 2 weeks.   Specialty:  Orthopedic Surgery   Contact information:   [address] [place] [address] [phone]        Signed: Vear Clock Darcell Sabino R 02/04/2015, 7:59 AM

## 2015-02-04 NOTE — Progress Notes (Signed)
02/04/15 Spoke with patient and wife about HHC. They selected [facility]. Contacted Zurii Hewes with [facility] and set up HHPT, [place] and RN. Patient stated that he has a rolling walker and a 3N1 at home and his wife will be able to assist after discharge.

## 2015-02-04 NOTE — Progress Notes (Signed)
Occupational Therapy Treatment Patient Details Name: [name] MRN: [id number] DOB: Sep 27, 1941 Today's Date: 02/04/2015    History of present illness Pt s/p R total hip revision   OT comments  Patient progressing towards acute OT goals, continue plan of care for now.   Follow Up Recommendations  No OT follow up;Supervision/Assistance - 24 hour    Equipment Recommendations  None recommended by OT    Recommendations for Other Services   None at this time   Precautions / Restrictions Precautions Precautions: Posterior Hip Precaution Comments: Reviewed precautions Restrictions Weight Bearing Restrictions: Yes RLE Weight Bearing: Weight bearing as tolerated     Mobility Bed Mobility  Defer to PT note  Transfers Overall transfer level: Needs assistance Equipment used: Rolling walker (2 wheeled) Transfers: Sit to/from Stand General transfer comment: cues for proper technique    Balance Overall balance assessment: Needs assistance Sitting-balance support: No upper extremity supported;Feet supported Sitting balance-Leahy Scale: Good     Standing balance support: Bilateral upper extremity supported;During functional activity Standing balance-Leahy Scale: Fair    ADL General ADL Comments: Patient continues to need verbal cues to adhere to posterior hip precautions and requires vcs for safey and technique during functional mobility/transfers. Patient's wife present and able to provide 24/7 supervision/assistance at home. Patient performed simulated walk-in shower transfer with supervision and verbal cues. Encouraged patient to use AE at home instead of letting wife do all LB ADLs for him.      Cognition   Behavior During Therapy: WFL for tasks assessed/performed Overall Cognitive Status: Within Functional Limits for tasks assessed                 Pertinent Vitals/ Pain       Pain Assessment: No/denies pain         Frequency Min 2X/week     Progress  Toward Goals  OT Goals(current goals can now be found in the care plan section)  Progress towards OT goals: Progressing toward goals     Plan Discharge plan remains appropriate       End of Session Equipment Utilized During Treatment: Rolling walker   Activity Tolerance Patient tolerated treatment well   Patient Left in chair;with call bell/phone within reach;with family/visitor present     Time: 3016-0109 OT Time Calculation (min): 12 min  Charges: OT General Charges $OT Visit: 1 Procedure OT Treatments $Self Care/Home Management : 8-22 mins  [name] , MS, OTR/L, CLT Pager: [phone]  02/04/2015, 10:55 AM

## 2015-02-04 NOTE — Progress Notes (Signed)
Physical Therapy Treatment Patient Details Name: [name] MRN: [id number] DOB: Aug 10, 1941 Today's Date: 02/04/2015    History of Present Illness 74 y.o. male admitted to [facility] on 02/02/15 s/p elective R THA revision.  Pt with posterior precautions and WBAT post op.  Pt with significant PMHx of R knee arthroscopy, L THA (04/2013), and R THA.    PT Comments    Pt is progressing well with his mobility, most limited today be bed mobility (difficulty on compliant, uneven mattress).  He was able to complete his LE exercises and show safety on the stairs simulating home entry.  He is due to d/c home this AM.  PT recommending HHPT f/u at discharge.   Follow Up Recommendations  Home health PT     Equipment Recommendations  None recommended by PT    Recommendations for Other Services   NA     Precautions / Restrictions Precautions Precautions: Posterior Hip Precaution Comments: Reviewed (verbally) posterior hip precautions with pt.   Restrictions RLE Weight Bearing: Weight bearing as tolerated    Mobility  Bed Mobility Overal bed mobility: Needs Assistance Bed Mobility: Supine to Sit     Supine to sit: Mod assist     General bed mobility comments: Mod assist to support trunk and help weight shift hips to get to sitting EOB.  Pt seems to be stuck on compliant mattress.    Transfers Overall transfer level: Needs assistance Equipment used: Rolling walker (2 wheeled) Transfers: Sit to/from Stand Sit to Stand: Min guard         General transfer comment: Min guard assist for safety, pt is stiff from being in the bed.   Ambulation/Gait Ambulation/Gait assistance: Supervision Ambulation Distance (Feet): 150 Feet Assistive device: Rolling walker (2 wheeled) Gait Pattern/deviations: Step-through pattern;Antalgic     General Gait Details: Pt with mildly antalgic gait pattern, good speed and good posture. Safe use of RW during our gait.     Stairs Stairs: Yes Stairs  assistance: Supervision Stair Management: One rail Right;Step to pattern;Sideways Number of Stairs: 2 General stair comments: Pt easily able to go up sideways with verbal cues for LE sequencing.  Pt wants to go in entry with no railing wife and therapist are encouraging him to go in the back entrance with a railing.       Balance Overall balance assessment: Needs assistance Sitting-balance support: Feet supported;No upper extremity supported Sitting balance-Leahy Scale: Good     Standing balance support: Bilateral upper extremity supported;No upper extremity supported;Single extremity supported Standing balance-Leahy Scale: Good                      Cognition Arousal/Alertness: Awake/alert Behavior During Therapy: WFL for tasks assessed/performed Overall Cognitive Status: Within Functional Limits for tasks assessed                      Exercises Total Joint Exercises Ankle Circles/Pumps: AROM;Both;20 reps;Supine Quad Sets: AROM;Both;10 reps;Supine Short Arc Quad: AROM;Right;10 reps;Supine Heel Slides: AAROM;Right;10 reps;Supine Hip ABduction/ADduction: AROM;Right;10 reps;Supine Long Arc Quad: AROM;Right;10 reps;Seated Knee Flexion: AROM;Right;10 reps;Standing Marching in Standing: AROM;Right;10 reps;Standing Standing Hip Extension: AROM;Right;10 reps;Standing        Pertinent Vitals/Pain Pain Assessment: No/denies pain           PT Goals (current goals can now be found in the care plan section) Acute Rehab PT Goals Patient Stated Goal: to go home Progress towards PT goals: Progressing toward goals    Frequency  7X/week    PT Plan Current plan remains appropriate       End of Session   Activity Tolerance: Patient tolerated treatment well Patient left: in chair;with call bell/phone within reach;with family/visitor present     Time: 4098-1191 PT Time Calculation (min) (ACUTE ONLY): 21 min  Charges:  $Gait Training: 8-22 mins                       [name], PT, DPT [phone]   02/04/2015, 3:09 PM

## 2015-02-04 NOTE — Progress Notes (Signed)
PATIENT ID: [name]  MRN: [id number]  DOB/AGE:  Aug 30, 1941 / 74 y.o.  2 Days Post-Op Procedure(s) (LRB): TOTAL HIP REVISION (Right)    PROGRESS NOTE Subjective: Patient is alert, oriented, no Nausea, no Vomiting, yes passing gas, no Bowel Movement. Taking PO well. Denies SOB, Chest or Calf Pain. Using Incentive Spirometer, PAS in place. Ambulate WBAT, with pt walking 120 ft yesterday. Patient reports pain as 3 on 0-10 scale  .    Objective: Vital signs in last 24 hours: Filed Vitals:   02/03/15 2018 02/04/15 0000 02/04/15 0351 02/04/15 0554  BP: 118/65   101/58  Pulse: 92   87  Temp: 100.2 F (37.9 C) 98.7 F (37.1 C)  99.4 F (37.4 C)  TempSrc: Oral Oral    Resp: 16 16 16 16   Weight:      SpO2: 97% 97% 97% 96%      Intake/Output from previous day: I/O last 3 completed shifts: In: 2095 [P.O.:720; I.V.:1375] Out: 3050 [Urine:3050]   Intake/Output this shift:     LABORATORY DATA:  Recent Labs  02/03/15 0650 02/04/15 0549  WBC 9.0 9.0  HGB 13.0 12.7*  HCT 38.5* 37.9*  PLT 175 176  NA 133*  --   K 4.3  --   CL 102  --   CO2 21  --   BUN 15  --   CREATININE 1.05  --   GLUCOSE 126*  --   CALCIUM 8.8  --     Examination: Neurologically intact Neurovascular intact Sensation intact distally Intact pulses distally Dorsiflexion/Plantar flexion intact Incision: scant drainage No cellulitis present Compartment soft} XR AP&Lat of hip shows well placed\fixed THA  Assessment:   2 Days Post-Op Procedure(s) (LRB): TOTAL HIP REVISION (Right) ADDITIONAL DIAGNOSIS:  Expected Acute Blood Loss Anemia,   Plan: PT/OT WBAT, THA  posterior precautions  DVT Prophylaxis: SCDx72 hrs, ASA 325 mg BID x 2 weeks  DISCHARGE PLAN: Home, when pt passes therapy goals.  DISCHARGE NEEDS: HHPT, HHRN, Walker and 3-in-1 comode seat

## 2015-07-13 DIAGNOSIS — C61 Malignant neoplasm of prostate: Secondary | ICD-10-CM | POA: Diagnosis present

## 2015-07-13 DIAGNOSIS — C7951 Secondary malignant neoplasm of bone: Secondary | ICD-10-CM

## 2015-12-10 DIAGNOSIS — K2270 Barrett's esophagus without dysplasia: Secondary | ICD-10-CM | POA: Insufficient documentation

## 2015-12-10 DIAGNOSIS — R590 Localized enlarged lymph nodes: Secondary | ICD-10-CM | POA: Insufficient documentation

## 2015-12-23 DIAGNOSIS — Z510 Encounter for antineoplastic radiation therapy: Secondary | ICD-10-CM | POA: Diagnosis not present

## 2015-12-23 DIAGNOSIS — C61 Malignant neoplasm of prostate: Secondary | ICD-10-CM | POA: Diagnosis not present

## 2016-01-21 DIAGNOSIS — C61 Malignant neoplasm of prostate: Secondary | ICD-10-CM | POA: Diagnosis not present

## 2016-01-21 DIAGNOSIS — Z510 Encounter for antineoplastic radiation therapy: Secondary | ICD-10-CM | POA: Diagnosis not present

## 2016-01-21 DIAGNOSIS — Z923 Personal history of irradiation: Secondary | ICD-10-CM | POA: Diagnosis not present

## 2016-01-25 DIAGNOSIS — C61 Malignant neoplasm of prostate: Secondary | ICD-10-CM | POA: Diagnosis not present

## 2016-02-04 DIAGNOSIS — Z0000 Encounter for general adult medical examination without abnormal findings: Secondary | ICD-10-CM | POA: Diagnosis not present

## 2016-02-04 DIAGNOSIS — N400 Enlarged prostate without lower urinary tract symptoms: Secondary | ICD-10-CM | POA: Diagnosis not present

## 2016-02-04 DIAGNOSIS — Z510 Encounter for antineoplastic radiation therapy: Secondary | ICD-10-CM | POA: Diagnosis not present

## 2016-02-04 DIAGNOSIS — C61 Malignant neoplasm of prostate: Secondary | ICD-10-CM | POA: Diagnosis not present

## 2016-02-04 DIAGNOSIS — E7800 Pure hypercholesterolemia, unspecified: Secondary | ICD-10-CM | POA: Diagnosis not present

## 2016-02-10 DIAGNOSIS — N182 Chronic kidney disease, stage 2 (mild): Secondary | ICD-10-CM | POA: Diagnosis not present

## 2016-02-10 DIAGNOSIS — K219 Gastro-esophageal reflux disease without esophagitis: Secondary | ICD-10-CM | POA: Diagnosis not present

## 2016-02-10 DIAGNOSIS — R748 Abnormal levels of other serum enzymes: Secondary | ICD-10-CM | POA: Diagnosis not present

## 2016-02-10 DIAGNOSIS — F17211 Nicotine dependence, cigarettes, in remission: Secondary | ICD-10-CM | POA: Diagnosis not present

## 2016-02-10 DIAGNOSIS — E785 Hyperlipidemia, unspecified: Secondary | ICD-10-CM | POA: Diagnosis not present

## 2016-03-24 DIAGNOSIS — D225 Melanocytic nevi of trunk: Secondary | ICD-10-CM | POA: Diagnosis not present

## 2016-03-24 DIAGNOSIS — L812 Freckles: Secondary | ICD-10-CM | POA: Diagnosis not present

## 2016-03-24 DIAGNOSIS — L820 Inflamed seborrheic keratosis: Secondary | ICD-10-CM | POA: Diagnosis not present

## 2016-03-24 DIAGNOSIS — L570 Actinic keratosis: Secondary | ICD-10-CM | POA: Diagnosis not present

## 2016-03-24 DIAGNOSIS — L308 Other specified dermatitis: Secondary | ICD-10-CM | POA: Diagnosis not present

## 2016-03-24 DIAGNOSIS — L821 Other seborrheic keratosis: Secondary | ICD-10-CM | POA: Diagnosis not present

## 2016-05-10 DIAGNOSIS — H2513 Age-related nuclear cataract, bilateral: Secondary | ICD-10-CM | POA: Diagnosis not present

## 2016-07-25 DIAGNOSIS — C61 Malignant neoplasm of prostate: Secondary | ICD-10-CM | POA: Diagnosis not present

## 2016-07-29 DIAGNOSIS — C61 Malignant neoplasm of prostate: Secondary | ICD-10-CM | POA: Diagnosis not present

## 2016-08-03 DIAGNOSIS — C61 Malignant neoplasm of prostate: Secondary | ICD-10-CM | POA: Diagnosis not present

## 2016-08-03 DIAGNOSIS — E785 Hyperlipidemia, unspecified: Secondary | ICD-10-CM | POA: Diagnosis not present

## 2016-08-10 DIAGNOSIS — D709 Neutropenia, unspecified: Secondary | ICD-10-CM | POA: Diagnosis not present

## 2016-08-10 DIAGNOSIS — N182 Chronic kidney disease, stage 2 (mild): Secondary | ICD-10-CM | POA: Diagnosis not present

## 2016-08-10 DIAGNOSIS — E785 Hyperlipidemia, unspecified: Secondary | ICD-10-CM | POA: Diagnosis not present

## 2016-08-10 DIAGNOSIS — K219 Gastro-esophageal reflux disease without esophagitis: Secondary | ICD-10-CM | POA: Diagnosis not present

## 2016-08-10 DIAGNOSIS — F17211 Nicotine dependence, cigarettes, in remission: Secondary | ICD-10-CM | POA: Diagnosis not present

## 2016-08-10 DIAGNOSIS — Z23 Encounter for immunization: Secondary | ICD-10-CM | POA: Diagnosis not present

## 2016-09-16 DIAGNOSIS — B079 Viral wart, unspecified: Secondary | ICD-10-CM | POA: Diagnosis not present

## 2016-09-16 DIAGNOSIS — D2372 Other benign neoplasm of skin of left lower limb, including hip: Secondary | ICD-10-CM | POA: Diagnosis not present

## 2016-09-16 DIAGNOSIS — L820 Inflamed seborrheic keratosis: Secondary | ICD-10-CM | POA: Diagnosis not present

## 2016-09-16 DIAGNOSIS — L570 Actinic keratosis: Secondary | ICD-10-CM | POA: Diagnosis not present

## 2016-09-23 ENCOUNTER — Ambulatory Visit (INDEPENDENT_AMBULATORY_CARE_PROVIDER_SITE_OTHER): Payer: Medicare Other

## 2016-09-23 ENCOUNTER — Ambulatory Visit (INDEPENDENT_AMBULATORY_CARE_PROVIDER_SITE_OTHER): Payer: Medicare Other | Admitting: Podiatry

## 2016-09-23 ENCOUNTER — Encounter: Payer: Self-pay | Admitting: Podiatry

## 2016-09-23 VITALS — Resp 16 | Ht 71.0 in | Wt 180.0 lb

## 2016-09-23 DIAGNOSIS — L600 Ingrowing nail: Secondary | ICD-10-CM

## 2016-09-23 DIAGNOSIS — M779 Enthesopathy, unspecified: Secondary | ICD-10-CM | POA: Diagnosis not present

## 2016-09-23 DIAGNOSIS — M79672 Pain in left foot: Secondary | ICD-10-CM | POA: Diagnosis not present

## 2016-09-23 DIAGNOSIS — M216X9 Other acquired deformities of unspecified foot: Secondary | ICD-10-CM | POA: Diagnosis not present

## 2016-09-23 DIAGNOSIS — M79671 Pain in right foot: Secondary | ICD-10-CM

## 2016-09-23 NOTE — Patient Instructions (Signed)

## 2016-09-23 NOTE — Progress Notes (Signed)
   Subjective:    Patient ID: [name], male    DOB: 1941-11-22, 75 y.o.   MRN: [id number]  HPI Chief Complaint  Patient presents with  . Nail Problem    Left foot; great toe & 2nd toe; nail discoloration & thickened nail; pt stated, "hurts to touch nail on big toe"  . Foot Pain    Bilateral; pt stated, "feet feel like they are swelling, but they are not"; x6 months      Review of Systems  All other systems reviewed and are negative.      Objective:   Physical Exam        Assessment & Plan:

## 2016-09-25 NOTE — Progress Notes (Signed)
Subjective:     Patient ID: [name], male   DOB: 09-Mar-1941, 75 y.o.   MRN: [id number]  HPI patient presents with wife with exquisite discomfort in the left hallux and second nails with thickness and mild on the right hallux. Patient states that he's tried to trim them and soak them and it causes bleeding and is increasingly difficult to do   Review of Systems  All other systems reviewed and are negative.      Objective:   Physical Exam  Constitutional: He is oriented to person, place, and time.  Cardiovascular: Intact distal pulses.   Musculoskeletal: Normal range of motion.  Neurological: He is oriented to person, place, and time.  Skin: Skin is warm.  Nursing note and vitals reviewed.  neurovascular status intact muscle strength adequate range of motion found to be within normal limits with patient noted to have severely damaged left hallux and second nail with incurvation of the corners and is also noted to have right hallux damage to a lesser degree. Patient's noted to have good digital perfusion and is well oriented 3     Assessment:     Significant nail disease with pain left hallux second with incurvation of the corners and moderate disease of the right hallux    Plan:     H&P all conditions reviewed and recommended nail removal. Explained procedure and risk and patient wants surgery and today I went ahead and infiltrated the left hallux and second toe 60 mg I can Marcaine mixture each and then remove the hallux and second nail exposed matrix and applied phenol 5 applications hallux 3 applications second toe followed by alcohol lavage and sterile dressing. Gave instructions on soaks and reappoint

## 2016-10-31 ENCOUNTER — Ambulatory Visit (INDEPENDENT_AMBULATORY_CARE_PROVIDER_SITE_OTHER): Payer: Medicare Other | Admitting: Orthopedic Surgery

## 2016-10-31 ENCOUNTER — Ambulatory Visit (INDEPENDENT_AMBULATORY_CARE_PROVIDER_SITE_OTHER): Payer: Medicare Other

## 2016-10-31 ENCOUNTER — Encounter (INDEPENDENT_AMBULATORY_CARE_PROVIDER_SITE_OTHER): Payer: Self-pay | Admitting: Orthopedic Surgery

## 2016-10-31 DIAGNOSIS — G8929 Other chronic pain: Secondary | ICD-10-CM

## 2016-10-31 DIAGNOSIS — M25511 Pain in right shoulder: Secondary | ICD-10-CM

## 2016-10-31 DIAGNOSIS — M79604 Pain in right leg: Secondary | ICD-10-CM

## 2016-10-31 DIAGNOSIS — M79605 Pain in left leg: Secondary | ICD-10-CM

## 2016-10-31 NOTE — Progress Notes (Signed)
Office Visit Note   Patient: [name]           Date of Birth: Sep 21, 1941           MRN: [id number] Visit Date: 10/31/2016 Requested by: Zachary Medal, MD [address], [place], [place] [address] PCP: Zachary Medal, MD  Subjective: Chief Complaint  Patient presents with  . Left Foot - Pain  . Right Foot - Pain  . Right Shoulder - Pain    HPI [name] is a 75 year old patient with bilateral knee pain some leg weakness as well as right shoulder pain.  Describes a several month history H medical onset right shoulder pain.  Is not constant.  Pain increases with increased activity.  Describes pain in the joint.  He wonders if an injection would help but doesn't think the pain is quite bad enough for that.  He is not had an injection before.  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  He's had some right shoulder pain on off all summer but denies a history of injury.  Patient also states that he feels like he is not walking quite as well as he has in the past he feels like his feet are heavy.  He describes symptoms consistent with decreased walking endurance.  He doesn't report much in the way of groin pain or back pain.  At times feels like his feet are swollen but he looks some of them and they're not.  Denies any having diabetes.  Esophagitis and had them x-rayed and had 2 toenails removed.  Nothing really remarkable by history on the radiographs.              Review of Systems All systems reviewed are negative as they relate to the chief complaint within the history of present illness.  Patient denies  fevers or chills.    Assessment & Plan: Visit Diagnoses:  1. Chronic right shoulder pain   2. Pain in left leg   3. 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  Even though the right one is  symptomatic of think he has the same issue going on in the left which is not symptomatic.  No real radicular symptoms.  I think the shoulder something he can live with.  No injection indicated this time and the right shoulder  In relation to the legs and back.  I think it has potentially spinal stenosis in his back which is giving him some of these gait alterations and limited walking endurance.  He has excellent motor strength is feet quads and hamstrings.  Hip flexor little weaker on the right than the left that may be result of the revision surgery.  I think based on how long [name] has been having his altered gait issues as well as these atypical neurological symptoms and we should rule rule out spinal stenosis with MRI scan of his lumbar spine.  Plain radiographs pretty unremarkable today except for some facet arthritis.  If the MRI scan is normal in the back then further workup with neurologist would be indicated  Follow-Up Instructions: No Follow-up on file.   Orders:  Orders Placed This Encounter  Procedures  . XR Shoulder Right  . XR Lumbar Spine 2-3 Views   No orders of the defined types were placed in this encounter.  Procedures: No procedures performed   Clinical Data: No additional findings.  Objective: Vital Signs: There were no vitals taken for this visit.  Physical Exam  Constitutional: He appears well-developed.  HENT:  Head: Normocephalic.  Eyes: EOM are normal.  Neck: Normal range of motion.  Cardiovascular: Normal rate.   Pulmonary/Chest: Effort normal.  Neurological: He is alert.  Skin: Skin is warm.  Psychiatric: He has a normal mood and affect.    Ortho Exam on examination the patient has full cervical spine range of motion with 5 out of 5 grip EPL FPL interosseous wrist flexion and wrist extension biceps triceps and deltoid strength.  Mild course grinding with internal/external rotation on the right and left shoulder.  5 minus out of 5 strength to  infraspinatus testing right and left.  Subscap strength intact bilateral shoulders.  No before meals joint tenderness is present for range of motion is present with no restriction of external rotation right or left shoulder.  No other masses lymph adenopathy or skin changes noted in the right or left shoulder.  Patient has palpable pedal pulses slight valgus alignment in the right leg but with normal gait.  Leg lengths equal.  Ankle dorsi flexion plantar flexion 5+ out of 5 quad and hamstring strength 5+ out of 5 no groin pain with internal/external rotation of the leg.  Reflexes symmetric bilateral biceps triceps patella and Achilles.  Negative clonus negative Babinski.  No masses lymph adenopathy or skin changes noted in the back region.  No trochanteric tenderness is noted.  Specialty Comments:  No specialty comments available.  Imaging: Xr Lumbar Spine 2-3 Views  Result Date: 10/31/2016 AP lateral lumbar spine ordered and obtained.  In general disc spaces maintained in the lower thoracic and upper lumbar area.  Some osteophytosis is present.  No spondylolisthesis.  Facet arthritis present in the lower lumbar sections.  Total hip replacements in good position on the visualized pelvic radiograph  Xr Shoulder Right  Result Date: 10/31/2016 AP axillary lateral and outlet view of the right shoulder ordered and obtained.  Acromiohumeral distance is negative.  Before meals joint arthritis is present.  Type III acromion is present.  Visualized lung fields are normal.  There is no glenohumeral arthritis on the axillary lateral.    PMFS History: Patient Active Problem List   Diagnosis Date Noted  . 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 Arthritis   . Barrett's esophagus   . GERD (gastroesophageal reflux disease)    takes Protonix daily  . History of colon polyps   .  Hyperlipidemia    takes Zetia and Welchol daily  . Joint pain   . Muscle spasm    takes Robaxin daily as needed    No family history on file.  Past Surgical History:  Procedure Laterality Date  . COLONOSCOPY    . ESOPHAGOGASTRODUODENOSCOPY    . KNEE ARTHROSCOPY Right 2011  . TOTAL HIP ARTHROPLASTY Left 05/07/2013   Procedure: LEFT TOTAL HIP ARTHROPLASTY ANTERIOR APPROACH;  Surgeon: Mcarthur Rossetti, MD;  Location: [facility];  Service: Orthopedics;  Laterality: Left;  . TOTAL HIP ARTHROPLASTY Right   . TOTAL HIP REVISION Right 02/02/2015   Procedure: TOTAL HIP REVISION;  Surgeon: Kerin Salen, MD;  Location: [facility];  Service: Orthopedics;  Laterality: Right;   Social History   Occupational History  . Not on file.  Social History Main Topics  . Smoking status: Former Research scientist (life sciences)  . Smokeless tobacco: Never Used     Comment: quit smoking 23yrs ago  . Alcohol use No  . Drug use: No  . Sexual activity: Not Currently

## 2017-01-23 ENCOUNTER — Other Ambulatory Visit (INDEPENDENT_AMBULATORY_CARE_PROVIDER_SITE_OTHER): Payer: Self-pay | Admitting: Radiology

## 2017-01-23 ENCOUNTER — Encounter (INDEPENDENT_AMBULATORY_CARE_PROVIDER_SITE_OTHER): Payer: Self-pay | Admitting: Orthopedic Surgery

## 2017-01-23 DIAGNOSIS — M25511 Pain in right shoulder: Secondary | ICD-10-CM

## 2017-01-23 DIAGNOSIS — M545 Low back pain: Principal | ICD-10-CM

## 2017-01-23 DIAGNOSIS — G8929 Other chronic pain: Secondary | ICD-10-CM

## 2017-01-23 DIAGNOSIS — C61 Malignant neoplasm of prostate: Secondary | ICD-10-CM | POA: Diagnosis not present

## 2017-01-23 NOTE — Progress Notes (Signed)
I saw [name] today.  His wife is coming in for visit.  He's having continued leg symptoms and foot weakness.  Last date is reviewed from November.  Want to get an MRI scan of the symptoms persisted.  In regards to his right shoulder that has gotten worse as well.  He had narrowing of the acromiohumeral distance.  Need MRI scan of that right shoulder to assess whether or not any arthroscopic intervention could be indicated.  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  I'll think is quite ready for reverse shoulder replacement yet.  I will order those 2 scans

## 2017-01-31 ENCOUNTER — Ambulatory Visit
Admission: RE | Admit: 2017-01-31 | Discharge: 2017-01-31 | Disposition: A | Discharge status: Home / Self Care | Payer: Medicare Other | Source: Ambulatory Visit | Attending: Orthopedic Surgery | Admitting: Orthopedic Surgery

## 2017-01-31 DIAGNOSIS — M25511 Pain in right shoulder: Principal | ICD-10-CM

## 2017-01-31 DIAGNOSIS — M5136 Other intervertebral disc degeneration, lumbar region: Secondary | ICD-10-CM | POA: Diagnosis not present

## 2017-01-31 DIAGNOSIS — M545 Low back pain, unspecified: Secondary | ICD-10-CM

## 2017-01-31 DIAGNOSIS — G8929 Other chronic pain: Secondary | ICD-10-CM

## 2017-02-07 DIAGNOSIS — E785 Hyperlipidemia, unspecified: Secondary | ICD-10-CM | POA: Diagnosis not present

## 2017-02-07 DIAGNOSIS — C61 Malignant neoplasm of prostate: Secondary | ICD-10-CM | POA: Diagnosis not present

## 2017-02-09 DIAGNOSIS — K2270 Barrett's esophagus without dysplasia: Secondary | ICD-10-CM | POA: Diagnosis not present

## 2017-02-09 DIAGNOSIS — R194 Change in bowel habit: Secondary | ICD-10-CM | POA: Diagnosis not present

## 2017-02-09 DIAGNOSIS — Z86010 Personal history of colonic polyps: Secondary | ICD-10-CM | POA: Diagnosis not present

## 2017-02-10 MED FILL — GAVILYTE-N SOLUTION: 420 | 1 days supply | Qty: 4000 | Fill #0

## 2017-02-13 ENCOUNTER — Telehealth (INDEPENDENT_AMBULATORY_CARE_PROVIDER_SITE_OTHER): Payer: Self-pay | Admitting: Orthopedic Surgery

## 2017-02-13 NOTE — Telephone Encounter (Signed)
Patient request a call with mri results

## 2017-02-13 NOTE — Telephone Encounter (Signed)
See note from patient

## 2017-02-14 DIAGNOSIS — N182 Chronic kidney disease, stage 2 (mild): Secondary | ICD-10-CM | POA: Diagnosis not present

## 2017-02-14 DIAGNOSIS — E785 Hyperlipidemia, unspecified: Secondary | ICD-10-CM | POA: Diagnosis not present

## 2017-02-14 DIAGNOSIS — K219 Gastro-esophageal reflux disease without esophagitis: Secondary | ICD-10-CM | POA: Diagnosis not present

## 2017-02-14 DIAGNOSIS — C61 Malignant neoplasm of prostate: Secondary | ICD-10-CM | POA: Diagnosis not present

## 2017-02-14 DIAGNOSIS — Z8042 Family history of malignant neoplasm of prostate: Secondary | ICD-10-CM | POA: Diagnosis not present

## 2017-02-14 DIAGNOSIS — Z0000 Encounter for general adult medical examination without abnormal findings: Secondary | ICD-10-CM | POA: Diagnosis not present

## 2017-02-14 NOTE — Telephone Encounter (Signed)
I called [name].  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  Please have him come in for an injection.  Please also send the MRI report to Dr. [name] who is his prostate cancer physician thank you

## 2017-02-15 NOTE — Telephone Encounter (Signed)
IC pt and scheduled appt for injection, need to fax report per Dr Randel Pigg note still.

## 2017-02-16 NOTE — Telephone Encounter (Signed)
Can you fax MRI report to Dr [name]?

## 2017-02-17 DIAGNOSIS — Z86010 Personal history of colonic polyps: Secondary | ICD-10-CM | POA: Diagnosis not present

## 2017-02-17 DIAGNOSIS — R194 Change in bowel habit: Secondary | ICD-10-CM | POA: Diagnosis not present

## 2017-02-17 DIAGNOSIS — K2270 Barrett's esophagus without dysplasia: Secondary | ICD-10-CM | POA: Diagnosis not present

## 2017-02-17 DIAGNOSIS — K5730 Diverticulosis of large intestine without perforation or abscess without bleeding: Secondary | ICD-10-CM | POA: Diagnosis not present

## 2017-02-17 DIAGNOSIS — K6289 Other specified diseases of anus and rectum: Secondary | ICD-10-CM | POA: Diagnosis not present

## 2017-02-17 NOTE — Telephone Encounter (Signed)
Done. Fax sent to [phone]

## 2017-02-20 ENCOUNTER — Encounter (INDEPENDENT_AMBULATORY_CARE_PROVIDER_SITE_OTHER): Payer: Self-pay | Admitting: Orthopedic Surgery

## 2017-02-20 ENCOUNTER — Ambulatory Visit (INDEPENDENT_AMBULATORY_CARE_PROVIDER_SITE_OTHER): Payer: Medicare Other | Admitting: Orthopedic Surgery

## 2017-02-20 DIAGNOSIS — M19011 Primary osteoarthritis, right shoulder: Secondary | ICD-10-CM | POA: Insufficient documentation

## 2017-02-20 DIAGNOSIS — M75121 Complete rotator cuff tear or rupture of right shoulder, not specified as traumatic: Secondary | ICD-10-CM | POA: Insufficient documentation

## 2017-02-22 DIAGNOSIS — M75121 Complete rotator cuff tear or rupture of right shoulder, not specified as traumatic: Secondary | ICD-10-CM

## 2017-02-22 MED ORDER — LIDOCAINE HCL 1 % IJ SOLN
5.0000 mL | INTRAMUSCULAR | Status: AC | PRN
  Administered 2017-02-22: 5 mL

## 2017-02-22 MED ORDER — BUPIVACAINE HCL 0.5 % IJ SOLN
9.0000 mL | INTRAMUSCULAR | Status: AC | PRN
  Administered 2017-02-22: 9 mL via INTRA_ARTICULAR

## 2017-02-22 MED ORDER — METHYLPREDNISOLONE ACETATE 40 MG/ML IJ SUSP
40.0000 mg | INTRAMUSCULAR | Status: AC | PRN
  Administered 2017-02-22: 40 mg via INTRA_ARTICULAR

## 2017-02-22 NOTE — Progress Notes (Signed)
Office Visit Note   Patient: [name]           Date of Birth: 1941/07/30           MRN: [id number] Visit Date: 02/20/2017 Requested by: Tommy Medal, MD [address] [place], [place] [address] PCP: Tommy Medal, MD  Subjective: Chief Complaint  Patient presents with  . Right Shoulder - Pain, Follow-up    HPI: [name] is a 76 year old patient here for injection after MRI scan.  He is having continued right shoulder pain.  He describes some weakness but is somewhat functional with the right shoulder.  MRI scan shows some tear of the rotator cuff supraspinatus and infraspinatus with retraction.  These are not repairable tears.              ROS: All systems reviewed are negative as they relate to the chief complaint within the history of present illness.  Patient denies  fevers or chills.   Assessment & Plan: Visit Diagnoses:  1. Complete tear of right rotator cuff   2. Arthritis of right shoulder region     Plan: Impression is right shoulder rotator cuff arthropathy with fairly functional shoulder this time.  Plan is injection which is performed today.  Could do 2-3 and these per year.  He is not really ready for surgery that would be the final option once his pain or functional disability increases.  Follow-Up Instructions: Return if symptoms worsen or fail to improve.   Orders:  No orders of the defined types were placed in this encounter.  No orders of the defined types were placed in this encounter.     Procedures: Large Joint Inj Date/Time: 02/22/2017 8:30 AM Performed by: Meredith Pel Authorized by: Meredith Pel   Consent Given by:  Patient Site marked: the procedure site was marked   Timeout: prior to procedure the correct patient, procedure, and site was verified   Indications:  Pain and diagnostic evaluation Location:  Shoulder Site:  R subacromial bursa Prep: patient was prepped and draped in usual sterile fashion     Needle Size:  18 G Needle Length:  1.5 inches Approach:  Posterior Ultrasound Guidance: No   Fluoroscopic Guidance: No   Arthrogram: No   Medications:  5 mL lidocaine 1 %; 9 mL bupivacaine 0.5 %; 40 mg methylPREDNISolone acetate 40 MG/ML Aspiration Attempted: No   Patient tolerance:  Patient tolerated the procedure well with no immediate complications     Clinical Data: No additional findings.  Objective: Vital Signs: There were no vitals taken for this visit.  Physical Exam:   Constitutional: Patient appears well-developed HEENT:  Head: Normocephalic Eyes:EOM are normal Neck: Normal range of motion Cardiovascular: Normal rate Pulmonary/chest: Effort normal Neurologic: Patient is alert Skin: Skin is warm Psychiatric: Patient has normal mood and affect    Ortho Exam: Right shoulder exam demonstrates course grinding and crepitus with active and passive motion of the shoulder.  He does have some weakness but not profound to infraspinatus supraspinatus and subscap muscle testing.  Forward flexion he has about 90 isolated glenohumeral abduction is about 70.  Passively he does have forward flexion and abduction above 90.  There is no other masses lymph adenopathy or skin changes noted in the shoulder girdle region.  No real instability to the shoulder.  Specialty Comments:  No specialty comments available.  Imaging: No results found.   PMFS History: Patient Active Problem List   Diagnosis Date Noted  .  Complete tear of right rotator cuff 02/20/2017  . Arthritis of right shoulder region 02/20/2017  . 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 Arthritis   . Barrett's esophagus   . GERD (gastroesophageal reflux disease)    takes Protonix daily  . History of colon polyps   . Hyperlipidemia    takes Zetia and Welchol daily  . Joint pain   . Muscle  spasm    takes Robaxin daily as needed    No family history on file.  Past Surgical History:  Procedure Laterality Date  . COLONOSCOPY    . ESOPHAGOGASTRODUODENOSCOPY    . KNEE ARTHROSCOPY Right 2011  . TOTAL HIP ARTHROPLASTY Left 05/07/2013   Procedure: LEFT TOTAL HIP ARTHROPLASTY ANTERIOR APPROACH;  Surgeon: Mcarthur Rossetti, MD;  Location: [facility];  Service: Orthopedics;  Laterality: Left;  . TOTAL HIP ARTHROPLASTY Right   . TOTAL HIP REVISION Right 02/02/2015   Procedure: TOTAL HIP REVISION;  Surgeon: Kerin Salen, MD;  Location: [facility];  Service: Orthopedics;  Laterality: Right;   Social History   Occupational History  . Not on file.   Social History Main Topics  . Smoking status: Former Research scientist (life sciences)  . Smokeless tobacco: Never Used     Comment: quit smoking 59yrs ago  . Alcohol use No  . Drug use: No  . Sexual activity: Not Currently

## 2017-02-27 DIAGNOSIS — R972 Elevated prostate specific antigen [PSA]: Secondary | ICD-10-CM | POA: Diagnosis not present

## 2017-04-02 ENCOUNTER — Encounter (HOSPITAL_COMMUNITY): Payer: Self-pay | Admitting: Emergency Medicine

## 2017-04-02 ENCOUNTER — Emergency Department (HOSPITAL_COMMUNITY): Payer: Medicare Other

## 2017-04-02 ENCOUNTER — Emergency Department (HOSPITAL_COMMUNITY)
Admission: EM | Admit: 2017-04-02 | Discharge: 2017-04-02 | Disposition: A | Discharge status: Home / Self Care | Payer: Medicare Other | Attending: Emergency Medicine | Admitting: Emergency Medicine

## 2017-04-02 DIAGNOSIS — Z87891 Personal history of nicotine dependence: Secondary | ICD-10-CM | POA: Insufficient documentation

## 2017-04-02 DIAGNOSIS — Z79899 Other long term (current) drug therapy: Secondary | ICD-10-CM | POA: Diagnosis not present

## 2017-04-02 DIAGNOSIS — Z7982 Long term (current) use of aspirin: Secondary | ICD-10-CM | POA: Diagnosis not present

## 2017-04-02 DIAGNOSIS — Z96642 Presence of left artificial hip joint: Secondary | ICD-10-CM | POA: Diagnosis not present

## 2017-04-02 DIAGNOSIS — S32059A Unspecified fracture of fifth lumbar vertebra, initial encounter for closed fracture: Secondary | ICD-10-CM | POA: Diagnosis not present

## 2017-04-02 DIAGNOSIS — M545 Low back pain: Secondary | ICD-10-CM | POA: Diagnosis not present

## 2017-04-02 DIAGNOSIS — M4856XA Collapsed vertebra, not elsewhere classified, lumbar region, initial encounter for fracture: Secondary | ICD-10-CM | POA: Diagnosis not present

## 2017-04-02 LAB — URINALYSIS, ROUTINE W REFLEX MICROSCOPIC
BILIRUBIN URINE: NEGATIVE
Bacteria, UA: NONE SEEN
Glucose, UA: NEGATIVE mg/dL
Ketones, ur: NEGATIVE mg/dL
Leukocytes, UA: NEGATIVE
Nitrite: NEGATIVE
PH: 7 (ref 5.0–8.0)
Protein, ur: NEGATIVE mg/dL
SPECIFIC GRAVITY, URINE: 1.005 (ref 1.005–1.030)
Squamous Epithelial / LPF: NONE SEEN
WBC, UA: NONE SEEN WBC/hpf (ref 0–5)

## 2017-04-02 MED ORDER — ONDANSETRON HCL 4 MG/2ML IJ SOLN
4.0000 mg | Freq: Once | INTRAMUSCULAR | Status: AC
  Administered 2017-04-02: 4 mg via INTRAVENOUS
  Filled 2017-04-02: qty 2

## 2017-04-02 MED ORDER — METHOCARBAMOL 500 MG PO TABS: 500.0000 mg | ORAL_TABLET | Freq: Four times a day (QID) | ORAL | 0 refills | Status: DC

## 2017-04-02 MED ORDER — MORPHINE SULFATE (PF) 4 MG/ML IV SOLN
4.0000 mg | Freq: Once | INTRAVENOUS | Status: AC
  Administered 2017-04-02: 4 mg via INTRAVENOUS
  Filled 2017-04-02: qty 1

## 2017-04-02 MED ORDER — OXYCODONE-ACETAMINOPHEN 5-325 MG PO TABS: 0.5000 | ORAL_TABLET | Freq: Four times a day (QID) | ORAL | 0 refills | Status: DC | PRN

## 2017-04-02 NOTE — Discharge Instructions (Signed)
Please read and follow all provided instructions.  Your diagnoses today include:  1. Non-traumatic compression fracture of fifth lumbar vertebra, initial encounter ([place])     Tests performed today include:  Vital signs - see below for your results today  CT scan of back - shows 20% compression fracture of the superior endplate of the L5 vertebrae  X-ray of hip - no problems  Medications prescribed:   Percocet (oxycodone/acetaminophen) - narcotic pain medication  DO NOT drive or perform any activities that require you to be awake and alert because this medicine can make you drowsy. BE VERY CAREFUL not to take multiple medicines containing Tylenol (also called acetaminophen). Doing so can lead to an overdose which can damage your liver and cause liver failure and possibly death.  Use pain medication only under direct supervision at the lowest possible dose needed to control your pain.    Robaxin (methocarbamol) - muscle relaxer medication  DO NOT drive or perform any activities that require you to be awake and alert because this medicine can make you drowsy.   Take any prescribed medications only as directed.  Home care instructions:   Follow any educational materials contained in this packet  Please rest, use ice or heat on your back for the next several days  Do not lift, push, pull anything more than 10 pounds for the next week  Follow-up instructions: Please follow-up with your orthopedist as planned.  Return instructions:  SEEK IMMEDIATE MEDICAL ATTENTION IF YOU HAVE:  New numbness, tingling, weakness, or problem with the use of your arms or legs  Severe back pain not relieved with medications  Loss control of your bowels or bladder  Increasing pain in any areas of the body (such as chest or abdominal pain)  Shortness of breath, dizziness, or fainting.   Worsening nausea (feeling sick to your stomach), vomiting, fever, or sweats  Any other emergent concerns  regarding your health   Additional Information:  Your vital signs today were: BP (!) 183/103 (BP Location: Right Arm)    Pulse (!) 59    Temp 97.8 F (36.6 C) (Oral)    Resp 16    SpO2 100%  If your blood pressure (BP) was elevated above 135/85 this visit, please have this repeated by your doctor within one month. --------------

## 2017-04-02 NOTE — ED Triage Notes (Signed)
Pt from home with increasing back pain since last Friday after mowing the lawn.  Pt tonight had increasing lower back pain that became intolerable.  History of bilateral hip replacements.  No loss of bowel or bladder control.  Radiates down legs when pain is at its worst.

## 2017-04-02 NOTE — ED Notes (Signed)
Patient transported to X-ray 

## 2017-04-02 NOTE — ED Provider Notes (Signed)
[facility] DEPT Provider Note   CSN: [id number] Arrival date & time: 04/02/17  0044     History   Chief Complaint Chief Complaint  Patient presents with  . Back Pain    HPI [name] is a 76 y.o. male.  Patient with history of localized prostate cancer, history of bilateral hip replacements -- presents with complaint of lower back pain with radiation into the right leg with associated weakness. Pain started 1 week ago after mowing his yard. No falls or other injuries. Pain was more mild at first. Currently he has waves of pain which are severe at times in his lower back. Currently while lying pain a 6 out of 10. When most severe patient rates 20 out of 10. Patient notes that is been unable to work in his garden due to his symptoms. Pain was much worse tonight prompting emergency department visit. He has taken ibuprofen, meloxicam, Zanaflex without relief. He has an appointment with his orthopedist scheduled in 2 days but could not wait. No numbness or tingling in his arms or legs. Patient denies warning symptoms of back pain including: fecal incontinence, urinary retention or overflow incontinence, night sweats, waking from sleep with back pain, unexplained fevers or weight loss, recent trauma.  The onset of this condition was acute. The course is worsening. Aggravating factors: movement. Alleviating factors: none.         Past Medical History:  Diagnosis Date  . Arthritis   . Barrett's esophagus   . GERD (gastroesophageal reflux disease)    takes Protonix daily  . History of colon polyps   . Hyperlipidemia    takes Zetia and Welchol daily  . Joint pain   . Muscle spasm    takes Robaxin daily as needed    Patient Active Problem List   Diagnosis Date Noted  . Complete tear of right rotator cuff 02/20/2017  . Arthritis of right shoulder region 02/20/2017  . 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 COLONOSCOPY    . ESOPHAGOGASTRODUODENOSCOPY    . KNEE ARTHROSCOPY Right 2011  . TOTAL HIP ARTHROPLASTY Left 05/07/2013   Procedure: LEFT TOTAL HIP ARTHROPLASTY ANTERIOR APPROACH;  Surgeon: Mcarthur Rossetti, MD;  Location: [facility];  Service: Orthopedics;  Laterality: Left;  . TOTAL HIP ARTHROPLASTY Right   . TOTAL HIP REVISION Right 02/02/2015   Procedure: TOTAL HIP REVISION;  Surgeon: Kerin Salen, MD;  Location: [facility];  Service: Orthopedics;  Laterality: Right;       Home Medications    Prior to Admission medications   Medication Sig Start Date End Date Taking? Authorizing Provider  aspirin EC 81 MG tablet Take 81 mg by mouth daily after supper.     Historical Provider, MD  ezetimibe (ZETIA) 10 MG tablet Take 10 mg by mouth daily after supper.     Historical Provider, MD  Leuprolide Acetate, 6 Month, (LUPRON DEPOT, 61-MONTH, IM) Inject into the muscle every 6 (six) months. Last Injection was August 2017    Historical Provider, MD  pantoprazole (PROTONIX) 40 MG tablet Take 40 mg by mouth daily after supper.     Historical Provider, MD  tamsulosin (FLOMAX) 0.4 MG CAPS capsule Take 0.4 mg by mouth daily.  08/10/16   Historical Provider, MD    Family History No family history on file.  Social History Social History  Substance Use Topics  . Smoking status: Former Research scientist (life sciences)  . Smokeless tobacco: Never Used     Comment: quit smoking 29yrs ago  . Alcohol use No     Allergies   Statins   Review of Systems Review of Systems  Constitutional: Negative for fever and unexpected weight change.  HENT: Negative for rhinorrhea and sore throat.   Eyes: Negative for redness.  Respiratory: Negative for cough.   Cardiovascular: Negative for chest pain.  Gastrointestinal: Negative for abdominal pain, constipation, diarrhea, nausea and vomiting.       Neg for fecal incontinence  Genitourinary: Negative for difficulty  urinating, dysuria, flank pain and hematuria.       Negative for urinary incontinence or retention  Musculoskeletal: Positive for back pain and gait problem. Negative for myalgias.  Skin: Negative for rash.  Neurological: Positive for weakness (RLQ with pain). Negative for numbness and headaches.       Negative for saddle paresthesias      Physical Exam Updated Vital Signs BP (!) 183/103 (BP Location: Right Arm)   Pulse (!) 59   Temp 97.8 F (36.6 C) (Oral)   Resp 16   SpO2 100%   Physical Exam  Constitutional: He appears well-developed and well-nourished.  HENT:  Head: Normocephalic and atraumatic.  Mouth/Throat: Oropharynx is clear and moist.  Eyes: Conjunctivae are normal.  Neck: Normal range of motion.  Cardiovascular: Normal rate.   No murmur heard. Pulmonary/Chest: Effort normal and breath sounds normal. No respiratory distress.  Abdominal: Soft. There is no tenderness. There is no rebound, no guarding and no CVA tenderness.  Musculoskeletal: Normal range of motion.       Cervical back: He exhibits normal range of motion, no tenderness and no bony tenderness.       Thoracic back: He exhibits normal range of motion, no tenderness and no bony tenderness.       Lumbar back: He exhibits tenderness (Mild, sacral and lower L-spine). He exhibits normal range of motion and no bony tenderness.  No step-off noted with palpation of spine.   Neurological: He is alert. He has normal reflexes. No sensory deficit. He exhibits normal muscle tone.  5/5 strength in entire lower extremities bilaterally. No sensation deficit.   Skin: Skin is warm and dry.  Psychiatric: He has a normal mood and affect.  Nursing note and vitals reviewed.    ED Treatments / Results  Labs (all labs ordered are listed, but only abnormal results are displayed) Labs Reviewed  URINALYSIS, ROUTINE W REFLEX MICROSCOPIC - Abnormal; Notable for the following:       Result Value   Color, Urine STRAW (*)    Hgb  urine dipstick SMALL (*)    All other components within normal limits    Radiology Ct Lumbar Spine Wo Contrast  Result Date: 04/02/2017 CLINICAL DATA:  Acute onset of lower back pain for 1 week. Initial encounter. EXAM: CT LUMBAR SPINE WITHOUT CONTRAST TECHNIQUE: Multidetector CT imaging of the lumbar spine was performed without intravenous contrast administration. Multiplanar CT image reconstructions were also generated. COMPARISON:  MRI of the lumbar spine performed 01/31/2017 FINDINGS: Segmentation: 5 lumbar type vertebrae. Alignment: Normal. Vertebrae: There appears to be a relatively acute compression deformity involving the superior endplate of L5, with approximately 20% loss of height. No additional fractures are seen. Paraspinal and other soft tissues: A very large left renal cyst is noted, and a small right renal cyst is seen. The paraspinal musculature is grossly unremarkable. Scattered calcification  is seen along the abdominal aorta and branches. Visualized small and large bowel loops are grossly unremarkable, aside from mild diverticulosis along the descending colon. Disc levels: Intervertebral disc spaces are grossly preserved. A broad-based calcified disc protrusion is noted at L1-L2. Facet disease is noted along the lower lumbar spine. IMPRESSION: 1. Relatively acute compression deformity involving the superior endplate of L5, with approximately 20% loss of height. 2. Very large left renal cyst, and small right renal cyst. 3. Scattered aortic atherosclerosis. 4. Mild diverticulosis along the descending colon. Electronically Signed   By: Garald Balding M.D.   On: 04/02/2017 02:49   Dg Hip Unilat W Or Wo Pelvis 2-3 Views Right  Result Date: 04/02/2017 CLINICAL DATA:  Lower back pain x1 week. History of prostate cancer and bilateral hip replacement. EXAM: DG HIP (WITH OR WITHOUT PELVIS) 2-3V RIGHT COMPARISON:  None. FINDINGS: Bilateral uncemented press-fit total hip arthroplasties are  identified without hardware malfunction, loosening nor fracture. Minimal heterotopic new bone formation is seen about the greater trochanter of the right hip. The bony pelvis demonstrates no fracture nor evidence of osteoblastic disease. Radiation implant seeds are noted projecting over the pubic symphysis. Minimal facet arthropathy of the visualized lumbar spine. IMPRESSION: Intact bilateral hip arthroplasties to the extent visualized. Minimal heterotopic new bone formation is seen adjacent the right greater trochanter. No evidence of osteoblastic disease nor acute fracture. Radiation implant seeds in expected location of the prostate. Mild facet arthropathy of the visualized lower lumbar spine. Electronically Signed   By: Ashley Royalty M.D.   On: 04/02/2017 02:59    Procedures Procedures (including critical care time)  Medications Ordered in ED Medications  morphine 4 MG/ML injection 4 mg (4 mg Intravenous Given 04/02/17 0133)  ondansetron (ZOFRAN) injection 4 mg (4 mg Intravenous Given 04/02/17 0133)  morphine 4 MG/ML injection 4 mg (4 mg Intravenous Given 04/02/17 0323)     Initial Impression / Assessment and Plan / ED Course  I have reviewed the triage vital signs and the nursing notes.  Pertinent labs & imaging results that were available during my care of the patient were reviewed by me and considered in my medical decision making (see chart for details).     Patient seen and examined. Work-up initiated. Medications ordered.   Vital signs reviewed and are as follows: BP (!) 183/103 (BP Location: Right Arm)   Pulse (!) 59   Temp 97.8 F (36.6 C) (Oral)   Resp 16   SpO2 100%   Discussed with Dr. Christy Gentles who will see.   Family updated on imaging results. Lumbar spine CT shows 20% compression fracture at L5 which is acute. Additional dose of IV morphine given. Patient has been ambulatory in the hallway with no assistance.  Plan is for discharge to home. Patient's daughter is a  Marine scientist. Patient will follow-up with his orthopedist on Monday as previously scheduled. They will ensure that he uses pain medicine appropriately and is supervised.   Final Clinical Impressions(s) / ED Diagnoses   Final diagnoses:  Non-traumatic compression fracture of fifth lumbar vertebra, initial encounter [facility])   Patient with one week of back pain. No warning flag signs and symptoms. Imaging as above. Plan is for pain control and orthopedic follow-up. He has ambulated well in emergency department and pain is controlled. Home with Percocet to use for severe pain.   New Prescriptions Discharge Medication List as of 04/02/2017  3:57 AM    START taking these medications   Details  methocarbamol (  ROBAXIN) 500 MG tablet Take 1 tablet (500 mg total) by mouth 4 (four) times daily., Starting Sun 04/02/2017, Print    oxyCODONE-acetaminophen (PERCOCET/ROXICET) 5-325 MG tablet Take 0.5-1 tablets by mouth every 6 (six) hours as needed for severe pain., Starting Sun 04/02/2017, Print         Carlisle Cater, PA-C 04/02/17 Gay, MD 04/02/17 [phone]

## 2017-04-02 NOTE — ED Notes (Signed)
Pt ambulated from rm-A1- A5 with little to no assistance. Pt did well walking and sitting back in room.

## 2017-04-03 ENCOUNTER — Ambulatory Visit (INDEPENDENT_AMBULATORY_CARE_PROVIDER_SITE_OTHER): Payer: Medicare Other | Admitting: Orthopedic Surgery

## 2017-04-03 ENCOUNTER — Encounter (INDEPENDENT_AMBULATORY_CARE_PROVIDER_SITE_OTHER): Payer: Self-pay | Admitting: Orthopedic Surgery

## 2017-04-03 ENCOUNTER — Encounter (INDEPENDENT_AMBULATORY_CARE_PROVIDER_SITE_OTHER): Payer: Self-pay

## 2017-04-03 DIAGNOSIS — M25551 Pain in right hip: Secondary | ICD-10-CM | POA: Diagnosis not present

## 2017-04-03 DIAGNOSIS — S32009A Unspecified fracture of unspecified lumbar vertebra, initial encounter for closed fracture: Secondary | ICD-10-CM | POA: Diagnosis not present

## 2017-04-03 DIAGNOSIS — S32050A Wedge compression fracture of fifth lumbar vertebra, initial encounter for closed fracture: Secondary | ICD-10-CM | POA: Diagnosis not present

## 2017-04-03 DIAGNOSIS — R972 Elevated prostate specific antigen [PSA]: Secondary | ICD-10-CM | POA: Diagnosis not present

## 2017-04-03 DIAGNOSIS — C61 Malignant neoplasm of prostate: Secondary | ICD-10-CM | POA: Diagnosis not present

## 2017-04-03 MED ORDER — OXYCODONE-ACETAMINOPHEN 5-325 MG PO TABS: 1.0000 | ORAL_TABLET | Freq: Four times a day (QID) | ORAL | 0 refills | Status: DC | PRN

## 2017-04-03 NOTE — Progress Notes (Signed)
Office Visit Note   Patient: [name]           Date of Birth: 11-Oct-1941           MRN: [id number] Visit Date: 04/03/2017 Requested by: Zachary Medal, MD [address], [place], [place] [address] PCP: Zachary Medal, MD  Subjective: Chief Complaint  Patient presents with  . Lower Back - Pain    HPI: [name] is a 76 year old patient with low back pain for a month.  He does have a history of prostate cancer.  No known injury to the initiation of this back pain.  He is just rolling around in bed one night and developed excruciating back pain.  Also reports some right thigh pain and weakness.  He went to emergency room where CT scan showed new L5 superior endplate fracture with 66% depression.  This is new from MRI scan obtained in February of this year.  He also reports some right thigh pain.  He has undergone right hip revision with Dr. Alfredo [name] about a year and a half ago.  He denies any fevers and chills.  He is on Lupron 4 treatments for prostate cancer.  His prostate specific antigen level is increasing.              ROS: All systems reviewed are negative as they relate to the chief complaint within the history of present illness.  Patient denies  fevers or chills.   Assessment & Plan: Visit Diagnoses:  1. Closed compression fracture of fifth lumbar vertebra, initial encounter ([name])   2. Pain in right hip     Plan: Impression is right thigh pain with history of revision total hip replacement.  I would favor the explanation of his back causing the symptoms.  Radiographs of the hip or pre-normal.  Potentially on one view slight lucency around the midportion of the S-ROM prosthesis.  For this hip pain now would recommend bone scanning to rule out loosening or resorption around the prosthesis particular the stem.  The cup looks perfect.  In regards to his back he has a new atraumatic compression fracture with increasing PSA.  Needs MRI scan to determine if this is a  pathologic fracture associated with his prostate cancer.  Also need to see if there is some type of nerve root compression potentially associated with that cancer which is affecting his right hip flexor strength.  I'll see him back after the MRI of his lumbar spine and bone scan to evaluate total hip loosening.  I also am going to refill his Percocet and put him in a lumbar corset for pain relief  Follow-Up Instructions: Return for after MRI.   Orders:  Orders Placed This Encounter  Procedures  . MR LUMBAR SPINE W CONTRAST  . NM Bone Scan 3 Phase Lower Extremity   Meds ordered this encounter  Medications  . oxyCODONE-acetaminophen (PERCOCET/ROXICET) 5-325 MG tablet    Sig: Take 1 tablet by mouth every 6 (six) hours as needed for severe pain.    Dispense:  45 tablet    Refill:  0      Procedures: No procedures performed   Clinical Data: No additional findings.  Objective: Vital Signs: There were no vitals taken for this visit.  Physical Exam:   Constitutional: Patient appears well-developed HEENT:  Head: Normocephalic Eyes:EOM are normal Neck: Normal range of motion Cardiovascular: Normal rate Pulmonary/chest: Effort normal Neurologic: Patient is alert Skin: Skin is warm Psychiatric: Patient has normal mood and  affect    Ortho Exam: Orthopedic exam demonstrates hip flexor weakness on the right compared to the left.  Ankle dorsi flexion plantar flexion quite history strength is symmetric.  No definite paresthesias L1 S1 bilaterally.  No nerve retention signs.  Does have some pain with forward lateral bending.  Pedal pulses palpable.  No trochanteric tenderness is noted.  Specialty Comments:  No specialty comments available.  Imaging: No results found.   PMFS History: Patient Active Problem List   Diagnosis Date Noted  . Complete tear of right rotator cuff 02/20/2017  . Arthritis of right shoulder region 02/20/2017  . 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 Arthritis   . Barrett's esophagus   . GERD (gastroesophageal reflux disease)    takes Protonix daily  . History of colon polyps   . Hyperlipidemia    takes Zetia and Welchol daily  . Joint pain   . Muscle spasm    takes Robaxin daily as needed    No family history on file.  Past Surgical History:  Procedure Laterality Date  . COLONOSCOPY    . ESOPHAGOGASTRODUODENOSCOPY    . KNEE ARTHROSCOPY Right 2011  . TOTAL HIP ARTHROPLASTY Left 05/07/2013   Procedure: LEFT TOTAL HIP ARTHROPLASTY ANTERIOR APPROACH;  Surgeon: Zachary Rossetti, MD;  Location: [facility];  Service: Orthopedics;  Laterality: Left;  . TOTAL HIP ARTHROPLASTY Right   . TOTAL HIP REVISION Right 02/02/2015   Procedure: TOTAL HIP REVISION;  Surgeon: Zachary Salen, MD;  Location: [facility];  Service: Orthopedics;  Laterality: Right;   Social History   Occupational History  . Not on file.   Social History Main Topics  . Smoking status: Former Research scientist (life sciences)  . Smokeless tobacco: Never Used     Comment: quit smoking 91yrs ago  . Alcohol use No  . Drug use: No  . Sexual activity: Not Currently

## 2017-04-05 ENCOUNTER — Telehealth (INDEPENDENT_AMBULATORY_CARE_PROVIDER_SITE_OTHER): Payer: Self-pay | Admitting: *Deleted

## 2017-04-05 NOTE — Telephone Encounter (Signed)
Courtney calling asking if Dr. Marlou Sa can sign an order in Epic for Nuclear Med 3 phase bone scan.

## 2017-04-10 ENCOUNTER — Ambulatory Visit (HOSPITAL_COMMUNITY)
Admission: RE | Admit: 2017-04-10 | Discharge: 2017-04-10 | Disposition: A | Discharge status: Home / Self Care | Payer: Medicare Other | Source: Ambulatory Visit | Attending: Orthopedic Surgery | Admitting: Orthopedic Surgery

## 2017-04-10 ENCOUNTER — Encounter (HOSPITAL_COMMUNITY)
Admission: RE | Admit: 2017-04-10 | Discharge: 2017-04-10 | Disposition: A | Discharge status: Home / Self Care | Payer: Medicare Other | Source: Ambulatory Visit | Attending: Orthopedic Surgery | Admitting: Orthopedic Surgery

## 2017-04-10 DIAGNOSIS — M25551 Pain in right hip: Secondary | ICD-10-CM | POA: Diagnosis not present

## 2017-04-10 DIAGNOSIS — Z96641 Presence of right artificial hip joint: Secondary | ICD-10-CM | POA: Diagnosis not present

## 2017-04-10 DIAGNOSIS — M545 Low back pain: Secondary | ICD-10-CM | POA: Diagnosis not present

## 2017-04-10 DIAGNOSIS — X58XXXA Exposure to other specified factors, initial encounter: Secondary | ICD-10-CM | POA: Insufficient documentation

## 2017-04-10 DIAGNOSIS — S32050A Wedge compression fracture of fifth lumbar vertebra, initial encounter for closed fracture: Secondary | ICD-10-CM | POA: Insufficient documentation

## 2017-04-10 MED ORDER — TECHNETIUM TC 99M MEDRONATE IV KIT
21.4000 | PACK | Freq: Once | INTRAVENOUS | Status: AC
  Administered 2017-04-10: 21.4 via INTRAVENOUS

## 2017-04-11 ENCOUNTER — Ambulatory Visit
Admission: RE | Admit: 2017-04-11 | Discharge: 2017-04-11 | Disposition: A | Discharge status: Home / Self Care | Payer: Medicare Other | Source: Ambulatory Visit | Attending: Orthopedic Surgery | Admitting: Orthopedic Surgery

## 2017-04-11 DIAGNOSIS — M48061 Spinal stenosis, lumbar region without neurogenic claudication: Secondary | ICD-10-CM | POA: Diagnosis not present

## 2017-04-11 DIAGNOSIS — S32050A Wedge compression fracture of fifth lumbar vertebra, initial encounter for closed fracture: Secondary | ICD-10-CM

## 2017-04-11 MED ORDER — GADOBENATE DIMEGLUMINE 529 MG/ML IV SOLN
17.0000 mL | Freq: Once | INTRAVENOUS | Status: AC | PRN
  Administered 2017-04-11: 17 mL via INTRAVENOUS

## 2017-04-12 ENCOUNTER — Ambulatory Visit (INDEPENDENT_AMBULATORY_CARE_PROVIDER_SITE_OTHER): Payer: Medicare Other | Admitting: Orthopedic Surgery

## 2017-04-14 ENCOUNTER — Ambulatory Visit (INDEPENDENT_AMBULATORY_CARE_PROVIDER_SITE_OTHER): Payer: Medicare Other | Admitting: Orthopedic Surgery

## 2017-04-14 ENCOUNTER — Encounter (INDEPENDENT_AMBULATORY_CARE_PROVIDER_SITE_OTHER): Payer: Self-pay | Admitting: Orthopedic Surgery

## 2017-04-14 DIAGNOSIS — Z96649 Presence of unspecified artificial hip joint: Secondary | ICD-10-CM

## 2017-04-14 NOTE — Progress Notes (Signed)
Office Visit Note   Patient: [name]           Date of Birth: Nov 09, 1941           MRN: [id number] Visit Date: 04/14/2017 Requested by: Tommy Medal, MD [address] [place], [place] [address] PCP: Thressa Sheller, MD  Subjective: No chief complaint on file.   HPI: [name] is a 76 year old patient who is here to review MRI of his lumbar spine and bone scan of his right hip.  He's had a right hip revision for metal-on-metal total hip replacement.  He is not having any fevers and chills but he does have a history of prostate cancer for which she has received treatment.  His former prostate cancer is aggressive and his PSA has been recently increasing.  He is on pain medication about 2 oxycodone per day.  Denies any bowel or bladder symptoms.  Has an appointment with prostate cancer specialists in [facility] in mid May.  He is wearing a back brace.  He does describe some start up pain in the right thigh.              ROS: All systems reviewed are negative as they relate to the chief complaint within the history of present illness.  Patient denies  fevers or chills.   Assessment & Plan: Visit Diagnoses:  1. S/P revision of total hip     Plan: Impression is worsening of L5 compression fracture which looks like it may have a metastatic prostate cancer component to it.  There is also a possible L3 osseous metastatic focus which is new.  He also has some L4-5 and moderate foraminal stenosis.  The bone scan is also reviewed with the patient in addition to the MRI scan.  Bone scan has one area of uptake at the lateral aspect of the tip of the stem.  The stem is an S-ROM stem which relies primarily on the sleeve for fixation.  I'll think there is anything to do about the hip at this time.  His more pressing problem is this metastatic prostate cancer issue.  At this time he may need local treatment for his back consisting of biopsy and possibly surgery or focal radiation  treatment.  Difficult to say for sure but we will let his specialist at [facility] on treatment plan moving forward  Follow-Up Instructions: Return if symptoms worsen or fail to improve.   Orders:  No orders of the defined types were placed in this encounter.  No orders of the defined types were placed in this encounter.     Procedures: No procedures performed   Clinical Data: No additional findings.  Objective: Vital Signs: There were no vitals taken for this visit.  Physical Exam:   Constitutional: Patient appears well-developed HEENT:  Head: Normocephalic Eyes:EOM are normal Neck: Normal range of motion Cardiovascular: Normal rate Pulmonary/chest: Effort normal Neurologic: Patient is alert Skin: Skin is warm Psychiatric: Patient has normal mood and affect    Ortho Exam: Orthopedic exam demonstrates patient walks with a back brace.  He has good hip flexion abduction and adduction strength.  No nerve retention signs.  No definite paresthesias today L1 S1 bilaterally.  Reflexes are symmetrically diminished patella and Achilles.  Pedal pulses palpable.  No groin pain with internal/external rotation of either leg.  Does have some pain with forward and lateral bending.  Specialty Comments:  No specialty comments available.  Imaging: No results found.  PMFS History: Patient Active Problem List   Diagnosis Date Noted  . Complete tear of right rotator cuff 02/20/2017  . Arthritis of right shoulder region 02/20/2017  . 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 Arthritis   . Barrett's esophagus   . GERD (gastroesophageal reflux disease)    takes Protonix daily  . History of colon polyps   . Hyperlipidemia    takes Zetia and Welchol daily  . Joint pain   . Muscle spasm    takes Robaxin daily as needed    No family history on  file.  Past Surgical History:  Procedure Laterality Date  . COLONOSCOPY    . ESOPHAGOGASTRODUODENOSCOPY    . KNEE ARTHROSCOPY Right 2011  . TOTAL HIP ARTHROPLASTY Left 05/07/2013   Procedure: LEFT TOTAL HIP ARTHROPLASTY ANTERIOR APPROACH;  Surgeon: Mcarthur Rossetti, MD;  Location: [facility];  Service: Orthopedics;  Laterality: Left;  . TOTAL HIP ARTHROPLASTY Right   . TOTAL HIP REVISION Right 02/02/2015   Procedure: TOTAL HIP REVISION;  Surgeon: Kerin Salen, MD;  Location: [place];  Service: Orthopedics;  Laterality: Right;   Social History   Occupational History  . Not on file.   Social History Main Topics  . Smoking status: Former Research scientist (life sciences)  . Smokeless tobacco: Never Used     Comment: quit smoking 43yrs ago  . Alcohol use No  . Drug use: No  . Sexual activity: Not Currently

## 2017-04-26 DIAGNOSIS — Z888 Allergy status to other drugs, medicaments and biological substances status: Secondary | ICD-10-CM | POA: Diagnosis not present

## 2017-04-26 DIAGNOSIS — Z923 Personal history of irradiation: Secondary | ICD-10-CM | POA: Diagnosis not present

## 2017-04-26 DIAGNOSIS — Z192 Hormone resistant malignancy status: Secondary | ICD-10-CM | POA: Diagnosis not present

## 2017-04-26 DIAGNOSIS — C61 Malignant neoplasm of prostate: Secondary | ICD-10-CM | POA: Diagnosis not present

## 2017-04-26 DIAGNOSIS — Z79899 Other long term (current) drug therapy: Secondary | ICD-10-CM | POA: Diagnosis not present

## 2017-04-26 DIAGNOSIS — C7951 Secondary malignant neoplasm of bone: Secondary | ICD-10-CM | POA: Diagnosis not present

## 2017-04-26 DIAGNOSIS — Z87891 Personal history of nicotine dependence: Secondary | ICD-10-CM | POA: Diagnosis not present

## 2017-05-08 DIAGNOSIS — C61 Malignant neoplasm of prostate: Secondary | ICD-10-CM | POA: Diagnosis not present

## 2017-05-08 DIAGNOSIS — C7951 Secondary malignant neoplasm of bone: Secondary | ICD-10-CM | POA: Diagnosis not present

## 2017-05-09 DIAGNOSIS — C61 Malignant neoplasm of prostate: Secondary | ICD-10-CM | POA: Diagnosis not present

## 2017-05-09 DIAGNOSIS — C7951 Secondary malignant neoplasm of bone: Secondary | ICD-10-CM | POA: Diagnosis not present

## 2017-05-09 DIAGNOSIS — S32050D Wedge compression fracture of fifth lumbar vertebra, subsequent encounter for fracture with routine healing: Secondary | ICD-10-CM | POA: Diagnosis not present

## 2017-05-09 DIAGNOSIS — C775 Secondary and unspecified malignant neoplasm of intrapelvic lymph nodes: Secondary | ICD-10-CM | POA: Diagnosis not present

## 2017-05-12 DIAGNOSIS — H2513 Age-related nuclear cataract, bilateral: Secondary | ICD-10-CM | POA: Diagnosis not present

## 2017-05-17 DIAGNOSIS — Z006 Encounter for examination for normal comparison and control in clinical research program: Secondary | ICD-10-CM | POA: Diagnosis not present

## 2017-05-17 DIAGNOSIS — C7951 Secondary malignant neoplasm of bone: Secondary | ICD-10-CM | POA: Diagnosis not present

## 2017-05-17 DIAGNOSIS — Z923 Personal history of irradiation: Secondary | ICD-10-CM | POA: Diagnosis not present

## 2017-05-17 DIAGNOSIS — C61 Malignant neoplasm of prostate: Secondary | ICD-10-CM | POA: Diagnosis not present

## 2017-05-17 DIAGNOSIS — M4856XA Collapsed vertebra, not elsewhere classified, lumbar region, initial encounter for fracture: Secondary | ICD-10-CM | POA: Diagnosis not present

## 2017-05-17 DIAGNOSIS — Z7982 Long term (current) use of aspirin: Secondary | ICD-10-CM | POA: Diagnosis not present

## 2017-05-17 DIAGNOSIS — I10 Essential (primary) hypertension: Secondary | ICD-10-CM | POA: Diagnosis not present

## 2017-05-17 DIAGNOSIS — M549 Dorsalgia, unspecified: Secondary | ICD-10-CM | POA: Diagnosis not present

## 2017-05-17 DIAGNOSIS — R972 Elevated prostate specific antigen [PSA]: Secondary | ICD-10-CM | POA: Diagnosis not present

## 2017-05-31 DIAGNOSIS — H1045 Other chronic allergic conjunctivitis: Secondary | ICD-10-CM | POA: Diagnosis not present

## 2017-06-07 DIAGNOSIS — C61 Malignant neoplasm of prostate: Secondary | ICD-10-CM | POA: Diagnosis not present

## 2017-06-07 DIAGNOSIS — M79651 Pain in right thigh: Secondary | ICD-10-CM | POA: Diagnosis not present

## 2017-06-07 DIAGNOSIS — M4850XA Collapsed vertebra, not elsewhere classified, site unspecified, initial encounter for fracture: Secondary | ICD-10-CM | POA: Diagnosis not present

## 2017-06-07 DIAGNOSIS — M8458XA Pathological fracture in neoplastic disease, other specified site, initial encounter for fracture: Secondary | ICD-10-CM | POA: Diagnosis not present

## 2017-06-20 DIAGNOSIS — C61 Malignant neoplasm of prostate: Secondary | ICD-10-CM | POA: Diagnosis not present

## 2017-06-20 DIAGNOSIS — C7951 Secondary malignant neoplasm of bone: Secondary | ICD-10-CM | POA: Diagnosis not present

## 2017-06-28 DIAGNOSIS — I10 Essential (primary) hypertension: Secondary | ICD-10-CM | POA: Diagnosis not present

## 2017-06-28 DIAGNOSIS — R972 Elevated prostate specific antigen [PSA]: Secondary | ICD-10-CM | POA: Diagnosis not present

## 2017-06-28 DIAGNOSIS — C61 Malignant neoplasm of prostate: Secondary | ICD-10-CM | POA: Diagnosis not present

## 2017-06-28 DIAGNOSIS — Z006 Encounter for examination for normal comparison and control in clinical research program: Secondary | ICD-10-CM | POA: Diagnosis not present

## 2017-06-28 DIAGNOSIS — K5900 Constipation, unspecified: Secondary | ICD-10-CM | POA: Diagnosis not present

## 2017-06-28 DIAGNOSIS — C7951 Secondary malignant neoplasm of bone: Secondary | ICD-10-CM | POA: Diagnosis not present

## 2017-07-24 DIAGNOSIS — R972 Elevated prostate specific antigen [PSA]: Secondary | ICD-10-CM | POA: Diagnosis not present

## 2017-07-24 DIAGNOSIS — C61 Malignant neoplasm of prostate: Secondary | ICD-10-CM | POA: Diagnosis not present

## 2017-07-24 DIAGNOSIS — C7951 Secondary malignant neoplasm of bone: Secondary | ICD-10-CM | POA: Diagnosis not present

## 2017-07-24 DIAGNOSIS — R590 Localized enlarged lymph nodes: Secondary | ICD-10-CM | POA: Diagnosis not present

## 2017-08-07 DIAGNOSIS — H04123 Dry eye syndrome of bilateral lacrimal glands: Secondary | ICD-10-CM | POA: Diagnosis not present

## 2017-08-08 DIAGNOSIS — E785 Hyperlipidemia, unspecified: Secondary | ICD-10-CM | POA: Diagnosis not present

## 2017-08-08 DIAGNOSIS — C61 Malignant neoplasm of prostate: Secondary | ICD-10-CM | POA: Diagnosis not present

## 2017-08-08 DIAGNOSIS — N390 Urinary tract infection, site not specified: Secondary | ICD-10-CM | POA: Diagnosis not present

## 2017-08-08 DIAGNOSIS — I10 Essential (primary) hypertension: Secondary | ICD-10-CM | POA: Diagnosis not present

## 2017-08-08 DIAGNOSIS — R319 Hematuria, unspecified: Secondary | ICD-10-CM | POA: Diagnosis not present

## 2017-08-14 DIAGNOSIS — R9721 Rising PSA following treatment for malignant neoplasm of prostate: Secondary | ICD-10-CM | POA: Diagnosis not present

## 2017-08-14 DIAGNOSIS — C7951 Secondary malignant neoplasm of bone: Secondary | ICD-10-CM | POA: Diagnosis not present

## 2017-08-14 DIAGNOSIS — C61 Malignant neoplasm of prostate: Secondary | ICD-10-CM | POA: Diagnosis not present

## 2017-08-14 DIAGNOSIS — Z006 Encounter for examination for normal comparison and control in clinical research program: Secondary | ICD-10-CM | POA: Diagnosis not present

## 2017-08-15 DIAGNOSIS — E7800 Pure hypercholesterolemia, unspecified: Secondary | ICD-10-CM | POA: Diagnosis not present

## 2017-08-15 DIAGNOSIS — K219 Gastro-esophageal reflux disease without esophagitis: Secondary | ICD-10-CM | POA: Diagnosis not present

## 2017-08-15 DIAGNOSIS — C61 Malignant neoplasm of prostate: Secondary | ICD-10-CM | POA: Diagnosis not present

## 2017-08-22 DIAGNOSIS — H16223 Keratoconjunctivitis sicca, not specified as Sjogren's, bilateral: Secondary | ICD-10-CM | POA: Diagnosis not present

## 2017-08-23 MED FILL — RESTASIS 0.05% EYE EMULSION: 0.05 | 30 days supply | Qty: 60 | Fill #0

## 2017-09-06 DIAGNOSIS — Z006 Encounter for examination for normal comparison and control in clinical research program: Secondary | ICD-10-CM | POA: Diagnosis not present

## 2017-09-06 DIAGNOSIS — C61 Malignant neoplasm of prostate: Secondary | ICD-10-CM | POA: Diagnosis not present

## 2017-09-06 DIAGNOSIS — Z79818 Long term (current) use of other agents affecting estrogen receptors and estrogen levels: Secondary | ICD-10-CM | POA: Diagnosis not present

## 2017-09-06 DIAGNOSIS — I10 Essential (primary) hypertension: Secondary | ICD-10-CM | POA: Diagnosis not present

## 2017-09-06 DIAGNOSIS — M549 Dorsalgia, unspecified: Secondary | ICD-10-CM | POA: Diagnosis not present

## 2017-09-06 DIAGNOSIS — K5900 Constipation, unspecified: Secondary | ICD-10-CM | POA: Diagnosis not present

## 2017-09-06 DIAGNOSIS — G893 Neoplasm related pain (acute) (chronic): Secondary | ICD-10-CM | POA: Diagnosis not present

## 2017-09-06 DIAGNOSIS — C7951 Secondary malignant neoplasm of bone: Secondary | ICD-10-CM | POA: Diagnosis not present

## 2017-09-29 DIAGNOSIS — Z23 Encounter for immunization: Secondary | ICD-10-CM | POA: Diagnosis not present

## 2017-11-15 DIAGNOSIS — Z006 Encounter for examination for normal comparison and control in clinical research program: Secondary | ICD-10-CM | POA: Diagnosis not present

## 2017-11-15 DIAGNOSIS — C7951 Secondary malignant neoplasm of bone: Secondary | ICD-10-CM | POA: Diagnosis not present

## 2017-11-15 DIAGNOSIS — Z923 Personal history of irradiation: Secondary | ICD-10-CM | POA: Diagnosis not present

## 2017-11-15 DIAGNOSIS — R9721 Rising PSA following treatment for malignant neoplasm of prostate: Secondary | ICD-10-CM | POA: Diagnosis not present

## 2017-11-15 DIAGNOSIS — M47816 Spondylosis without myelopathy or radiculopathy, lumbar region: Secondary | ICD-10-CM | POA: Diagnosis not present

## 2017-11-15 DIAGNOSIS — Z79899 Other long term (current) drug therapy: Secondary | ICD-10-CM | POA: Diagnosis not present

## 2017-11-15 DIAGNOSIS — G893 Neoplasm related pain (acute) (chronic): Secondary | ICD-10-CM | POA: Diagnosis not present

## 2017-11-15 DIAGNOSIS — C61 Malignant neoplasm of prostate: Secondary | ICD-10-CM | POA: Diagnosis not present

## 2017-11-15 DIAGNOSIS — K5900 Constipation, unspecified: Secondary | ICD-10-CM | POA: Diagnosis not present

## 2017-11-15 DIAGNOSIS — Z192 Hormone resistant malignancy status: Secondary | ICD-10-CM | POA: Diagnosis not present

## 2017-11-15 DIAGNOSIS — I10 Essential (primary) hypertension: Secondary | ICD-10-CM | POA: Diagnosis not present

## 2017-11-15 DIAGNOSIS — Z5111 Encounter for antineoplastic chemotherapy: Secondary | ICD-10-CM | POA: Diagnosis not present

## 2017-11-15 MED FILL — RESTASIS 0.05% EYE EMULSION: 0.05 | 30 days supply | Qty: 60 | Fill #1

## 2017-11-21 DIAGNOSIS — H16223 Keratoconjunctivitis sicca, not specified as Sjogren's, bilateral: Secondary | ICD-10-CM | POA: Diagnosis not present

## 2017-12-25 ENCOUNTER — Other Ambulatory Visit: Payer: Medicare Other

## 2017-12-25 ENCOUNTER — Ambulatory Visit: Payer: Medicare Other | Admitting: Oncology

## 2018-01-19 DIAGNOSIS — C7951 Secondary malignant neoplasm of bone: Secondary | ICD-10-CM | POA: Diagnosis not present

## 2018-01-19 DIAGNOSIS — Z79899 Other long term (current) drug therapy: Secondary | ICD-10-CM | POA: Diagnosis not present

## 2018-01-19 DIAGNOSIS — C61 Malignant neoplasm of prostate: Secondary | ICD-10-CM | POA: Diagnosis not present

## 2018-01-19 DIAGNOSIS — Z5111 Encounter for antineoplastic chemotherapy: Secondary | ICD-10-CM | POA: Diagnosis not present

## 2018-01-19 MED FILL — ONDANSETRON HCL 8 MG TABLET: 8 | 10 days supply | Qty: 30 | Fill #0

## 2018-01-19 MED FILL — predniSONE 5 MG TABS: 5 | 30 days supply | Qty: 60 | Fill #0

## 2018-01-22 DIAGNOSIS — C7951 Secondary malignant neoplasm of bone: Secondary | ICD-10-CM | POA: Diagnosis not present

## 2018-01-22 DIAGNOSIS — R590 Localized enlarged lymph nodes: Secondary | ICD-10-CM | POA: Diagnosis not present

## 2018-01-22 DIAGNOSIS — Z8042 Family history of malignant neoplasm of prostate: Secondary | ICD-10-CM | POA: Diagnosis not present

## 2018-01-22 DIAGNOSIS — C61 Malignant neoplasm of prostate: Secondary | ICD-10-CM | POA: Diagnosis not present

## 2018-01-22 DIAGNOSIS — R972 Elevated prostate specific antigen [PSA]: Secondary | ICD-10-CM | POA: Diagnosis not present

## 2018-02-07 DIAGNOSIS — M4856XA Collapsed vertebra, not elsewhere classified, lumbar region, initial encounter for fracture: Secondary | ICD-10-CM | POA: Diagnosis not present

## 2018-02-07 DIAGNOSIS — Z5111 Encounter for antineoplastic chemotherapy: Secondary | ICD-10-CM | POA: Diagnosis not present

## 2018-02-07 DIAGNOSIS — C7951 Secondary malignant neoplasm of bone: Secondary | ICD-10-CM | POA: Diagnosis not present

## 2018-02-07 DIAGNOSIS — M549 Dorsalgia, unspecified: Secondary | ICD-10-CM | POA: Diagnosis not present

## 2018-02-07 DIAGNOSIS — C61 Malignant neoplasm of prostate: Secondary | ICD-10-CM | POA: Diagnosis not present

## 2018-02-07 DIAGNOSIS — R9721 Rising PSA following treatment for malignant neoplasm of prostate: Secondary | ICD-10-CM | POA: Diagnosis not present

## 2018-02-07 DIAGNOSIS — Z79899 Other long term (current) drug therapy: Secondary | ICD-10-CM | POA: Diagnosis not present

## 2018-02-19 MED FILL — predniSONE 5 MG TABS: 5 | 30 days supply | Qty: 60 | Fill #1

## 2018-02-28 DIAGNOSIS — M898X8 Other specified disorders of bone, other site: Secondary | ICD-10-CM | POA: Diagnosis not present

## 2018-02-28 DIAGNOSIS — Z5111 Encounter for antineoplastic chemotherapy: Secondary | ICD-10-CM | POA: Diagnosis not present

## 2018-02-28 DIAGNOSIS — C7951 Secondary malignant neoplasm of bone: Secondary | ICD-10-CM | POA: Diagnosis not present

## 2018-02-28 DIAGNOSIS — L659 Nonscarring hair loss, unspecified: Secondary | ICD-10-CM | POA: Diagnosis not present

## 2018-02-28 DIAGNOSIS — M549 Dorsalgia, unspecified: Secondary | ICD-10-CM | POA: Diagnosis not present

## 2018-02-28 DIAGNOSIS — C61 Malignant neoplasm of prostate: Secondary | ICD-10-CM | POA: Diagnosis not present

## 2018-02-28 DIAGNOSIS — Z79899 Other long term (current) drug therapy: Secondary | ICD-10-CM | POA: Diagnosis not present

## 2018-02-28 DIAGNOSIS — M4856XA Collapsed vertebra, not elsewhere classified, lumbar region, initial encounter for fracture: Secondary | ICD-10-CM | POA: Diagnosis not present

## 2018-03-06 DIAGNOSIS — C61 Malignant neoplasm of prostate: Secondary | ICD-10-CM | POA: Diagnosis not present

## 2018-03-06 DIAGNOSIS — I10 Essential (primary) hypertension: Secondary | ICD-10-CM | POA: Diagnosis not present

## 2018-03-06 DIAGNOSIS — E785 Hyperlipidemia, unspecified: Secondary | ICD-10-CM | POA: Diagnosis not present

## 2018-03-06 DIAGNOSIS — N182 Chronic kidney disease, stage 2 (mild): Secondary | ICD-10-CM | POA: Diagnosis not present

## 2018-03-13 DIAGNOSIS — C61 Malignant neoplasm of prostate: Secondary | ICD-10-CM | POA: Diagnosis not present

## 2018-03-13 DIAGNOSIS — K219 Gastro-esophageal reflux disease without esophagitis: Secondary | ICD-10-CM | POA: Diagnosis not present

## 2018-03-13 DIAGNOSIS — Z0000 Encounter for general adult medical examination without abnormal findings: Secondary | ICD-10-CM | POA: Diagnosis not present

## 2018-03-13 DIAGNOSIS — E785 Hyperlipidemia, unspecified: Secondary | ICD-10-CM | POA: Diagnosis not present

## 2018-03-16 MED FILL — predniSONE 5 MG TABS: 5 | 30 days supply | Qty: 60 | Fill #2

## 2018-03-21 DIAGNOSIS — C61 Malignant neoplasm of prostate: Secondary | ICD-10-CM | POA: Diagnosis not present

## 2018-03-21 DIAGNOSIS — M4856XD Collapsed vertebra, not elsewhere classified, lumbar region, subsequent encounter for fracture with routine healing: Secondary | ICD-10-CM | POA: Diagnosis not present

## 2018-03-21 DIAGNOSIS — Z888 Allergy status to other drugs, medicaments and biological substances status: Secondary | ICD-10-CM | POA: Diagnosis not present

## 2018-03-21 DIAGNOSIS — M47816 Spondylosis without myelopathy or radiculopathy, lumbar region: Secondary | ICD-10-CM | POA: Diagnosis not present

## 2018-03-21 DIAGNOSIS — C7951 Secondary malignant neoplasm of bone: Secondary | ICD-10-CM | POA: Diagnosis not present

## 2018-03-27 MED FILL — RESTASIS 0.05% EYE EMULSION: 0.05 | 30 days supply | Qty: 60 | Fill #2

## 2018-04-16 DIAGNOSIS — C7951 Secondary malignant neoplasm of bone: Secondary | ICD-10-CM | POA: Diagnosis not present

## 2018-04-16 DIAGNOSIS — C61 Malignant neoplasm of prostate: Secondary | ICD-10-CM | POA: Diagnosis not present

## 2018-04-16 MED FILL — predniSONE 5 MG TABS: 5 | 30 days supply | Qty: 60 | Fill #3

## 2018-04-18 DIAGNOSIS — C7951 Secondary malignant neoplasm of bone: Secondary | ICD-10-CM | POA: Diagnosis not present

## 2018-04-18 DIAGNOSIS — E279 Disorder of adrenal gland, unspecified: Secondary | ICD-10-CM | POA: Diagnosis not present

## 2018-04-18 DIAGNOSIS — Z79899 Other long term (current) drug therapy: Secondary | ICD-10-CM | POA: Diagnosis not present

## 2018-04-18 DIAGNOSIS — M4856XA Collapsed vertebra, not elsewhere classified, lumbar region, initial encounter for fracture: Secondary | ICD-10-CM | POA: Diagnosis not present

## 2018-04-18 DIAGNOSIS — C61 Malignant neoplasm of prostate: Secondary | ICD-10-CM | POA: Diagnosis not present

## 2018-04-18 DIAGNOSIS — R590 Localized enlarged lymph nodes: Secondary | ICD-10-CM | POA: Diagnosis not present

## 2018-04-18 DIAGNOSIS — Z5111 Encounter for antineoplastic chemotherapy: Secondary | ICD-10-CM | POA: Diagnosis not present

## 2018-05-09 DIAGNOSIS — C61 Malignant neoplasm of prostate: Secondary | ICD-10-CM | POA: Diagnosis not present

## 2018-05-09 DIAGNOSIS — M549 Dorsalgia, unspecified: Secondary | ICD-10-CM | POA: Diagnosis not present

## 2018-05-09 DIAGNOSIS — R159 Full incontinence of feces: Secondary | ICD-10-CM | POA: Diagnosis not present

## 2018-05-09 DIAGNOSIS — K625 Hemorrhage of anus and rectum: Secondary | ICD-10-CM | POA: Diagnosis not present

## 2018-05-09 DIAGNOSIS — M47816 Spondylosis without myelopathy or radiculopathy, lumbar region: Secondary | ICD-10-CM | POA: Diagnosis not present

## 2018-05-09 DIAGNOSIS — C7951 Secondary malignant neoplasm of bone: Secondary | ICD-10-CM | POA: Diagnosis not present

## 2018-05-09 DIAGNOSIS — S32000S Wedge compression fracture of unspecified lumbar vertebra, sequela: Secondary | ICD-10-CM | POA: Diagnosis not present

## 2018-05-09 DIAGNOSIS — Z87738 Personal history of other specified (corrected) congenital malformations of digestive system: Secondary | ICD-10-CM | POA: Diagnosis not present

## 2018-05-09 DIAGNOSIS — S32050D Wedge compression fracture of fifth lumbar vertebra, subsequent encounter for fracture with routine healing: Secondary | ICD-10-CM | POA: Diagnosis not present

## 2018-05-09 DIAGNOSIS — N289 Disorder of kidney and ureter, unspecified: Secondary | ICD-10-CM | POA: Diagnosis not present

## 2018-05-09 DIAGNOSIS — R2689 Other abnormalities of gait and mobility: Secondary | ICD-10-CM | POA: Diagnosis not present

## 2018-05-10 MED FILL — predniSONE 5 MG TABS: 5 | 30 days supply | Qty: 60 | Fill #4

## 2018-05-15 DIAGNOSIS — H2513 Age-related nuclear cataract, bilateral: Secondary | ICD-10-CM | POA: Diagnosis not present

## 2018-05-28 MED FILL — TAMSULOSIN HCL 0.4 MG CAP: 0.4 | 90 days supply | Qty: 90 | Fill #0

## 2018-05-30 DIAGNOSIS — M549 Dorsalgia, unspecified: Secondary | ICD-10-CM | POA: Diagnosis not present

## 2018-05-30 DIAGNOSIS — Z8719 Personal history of other diseases of the digestive system: Secondary | ICD-10-CM | POA: Diagnosis not present

## 2018-05-30 DIAGNOSIS — K625 Hemorrhage of anus and rectum: Secondary | ICD-10-CM | POA: Diagnosis not present

## 2018-05-30 DIAGNOSIS — C7951 Secondary malignant neoplasm of bone: Secondary | ICD-10-CM | POA: Diagnosis not present

## 2018-05-30 DIAGNOSIS — M25552 Pain in left hip: Secondary | ICD-10-CM | POA: Diagnosis not present

## 2018-05-30 DIAGNOSIS — R159 Full incontinence of feces: Secondary | ICD-10-CM | POA: Diagnosis not present

## 2018-05-30 DIAGNOSIS — C61 Malignant neoplasm of prostate: Secondary | ICD-10-CM | POA: Diagnosis not present

## 2018-05-30 DIAGNOSIS — N2889 Other specified disorders of kidney and ureter: Secondary | ICD-10-CM | POA: Diagnosis not present

## 2018-05-30 DIAGNOSIS — Z79899 Other long term (current) drug therapy: Secondary | ICD-10-CM | POA: Diagnosis not present

## 2018-05-30 DIAGNOSIS — N289 Disorder of kidney and ureter, unspecified: Secondary | ICD-10-CM | POA: Diagnosis not present

## 2018-05-30 DIAGNOSIS — R599 Enlarged lymph nodes, unspecified: Secondary | ICD-10-CM | POA: Diagnosis not present

## 2018-05-30 DIAGNOSIS — Z5111 Encounter for antineoplastic chemotherapy: Secondary | ICD-10-CM | POA: Diagnosis not present

## 2018-05-31 DIAGNOSIS — K921 Melena: Secondary | ICD-10-CM | POA: Diagnosis not present

## 2018-06-04 MED FILL — predniSONE 5 MG TABS: 5 | 30 days supply | Qty: 60 | Fill #5

## 2018-06-13 ENCOUNTER — Ambulatory Visit (INDEPENDENT_AMBULATORY_CARE_PROVIDER_SITE_OTHER): Payer: Medicare Other

## 2018-06-13 ENCOUNTER — Ambulatory Visit (INDEPENDENT_AMBULATORY_CARE_PROVIDER_SITE_OTHER): Payer: Medicare Other | Admitting: Orthopedic Surgery

## 2018-06-13 ENCOUNTER — Encounter (INDEPENDENT_AMBULATORY_CARE_PROVIDER_SITE_OTHER): Payer: Self-pay | Admitting: Orthopedic Surgery

## 2018-06-13 DIAGNOSIS — G8929 Other chronic pain: Secondary | ICD-10-CM | POA: Diagnosis not present

## 2018-06-13 DIAGNOSIS — M5442 Lumbago with sciatica, left side: Secondary | ICD-10-CM | POA: Diagnosis not present

## 2018-06-13 MED ORDER — OXYCODONE-ACETAMINOPHEN 5-325 MG PO TABS: ORAL_TABLET | ORAL | 0 refills | Status: DC

## 2018-06-13 MED FILL — OXYCODONE-ACETAMINOPHEN 5-3: 5-325 | 15 days supply | Qty: 45 | Fill #0

## 2018-06-13 NOTE — Progress Notes (Signed)
Office Visit Note   Patient: [name]           Date of Birth: 1941/07/07           MRN: [id number] Visit Date: 06/13/2018 Requested by: Thressa Sheller, [address], [place], [place] [address] PCP: [name], MD  Subjective: Chief Complaint  Patient presents with  . Left Leg - Pain  . Left Hip - Pain    HPI: [name] is a patient with left hip and buttock pain.  He ambulates with a cane.  He has pretty severe pain when he tries to get up and walk.  Denies any history of injury.  States that the pain radiates down the left leg from the buttock to the back of the leg down to the ankle.  He has had pain for about 2 months.  The pain wakes him from sleep at night.  He has to sleep in a recliner.  He is unable to lay flat.  He takes only ibuprofen for pain.  Had left total hip replacement done in 2014.  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  MRI scan from April of last year demonstrated compression fracture at L5 along with metastatic lesion of the pedicle on the left side at L3.  He is currently under treatment for his metastatic prostate cancer at [facility]: All systems reviewed are negative as they relate to the chief complaint within the history of present illness.  Patient denies  fevers or chills.   Assessment & Plan: Visit Diagnoses:  1. Chronic left-sided low back pain with left-sided sciatica     Plan: Impression is left-sided back and leg pain which is in all likelihood improbability coming from metastatic disease to his lumbar spine.  Left hip looks normal on radiographs.  He needs MRI with contrast of the lumbar spine to evaluate the source of the left-sided radiculopathy with a history of metastatic prostate cancer.  Percocet also prescribed for this pain.  See him back after the scan  Follow-Up Instructions: Return for after MRI.   Orders:  Orders Placed This Encounter  Procedures  . XR HIP UNILAT W OR W/O PELVIS 2-3  VIEWS LEFT  . XR Lumbar Spine 2-3 Views  . MR LUMBAR SPINE W CONTRAST   Meds ordered this encounter  Medications  . oxyCODONE-acetaminophen (PERCOCET/ROXICET) 5-325 MG tablet    Sig: 1 po q 8 hrs prn pain    Dispense:  45 tablet    Refill:  0      Procedures: No procedures performed   Clinical Data: No additional findings.  Objective: Vital Signs: There were no vitals taken for this visit.  Physical Exam:   Constitutional: Patient appears well-developed HEENT:  Head: Normocephalic Eyes:EOM are normal Neck: Normal range of motion Cardiovascular: Normal rate Pulmonary/chest: Effort normal Neurologic: Patient is alert Skin: Skin is warm Psychiatric: Patient has normal mood and affect    Ortho Exam: Orthopedic exam demonstrates full active and passive range of motion of the ankles and knees.  No real groin pain with internal/external rotation of the leg but he does have nerve root tension signs on the left and not on the right.  No definite paresthesias L1-S1.  No trochanteric tenderness is noted.  No atrophy in the legs.  He has good hip flexion abduction and adduction strength.  Specialty Comments:  No specialty comments available.  Imaging:  Xr Hip Unilat W Or W/o Pelvis 2-3 Views Left  Result Date: 06/13/2018 AP pelvis lateral left hip reviewed.  Total hip prosthesis in good position alignment with no evidence of loosening or increased bone density in the acetabular or proximal femoral region.  Xr Lumbar Spine 2-3 Views  Result Date: 06/13/2018 AP lateral lumbar spine reviewed.  Mild degenerative changes present throughout the lumbar spine including the facet joints and intradiscal spaces.  Evidence of prior compression fracture type injury noted at L5 with no worsening.  No definite evidence of metastatic disease on plain radiographs.  MRI scan may be more sensitive in this regard.    PMFS History: Patient Active Problem List   Diagnosis Date Noted  . Complete  tear of right rotator cuff 02/20/2017  . Arthritis of right shoulder region 02/20/2017  . 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 Arthritis   . Barrett's esophagus   . GERD (gastroesophageal reflux disease)    takes Protonix daily  . History of colon polyps   . Hyperlipidemia    takes Zetia and Welchol daily  . Joint pain   . Muscle spasm    takes Robaxin daily as needed    History reviewed. No pertinent family history.  Past Surgical History:  Procedure Laterality Date  . COLONOSCOPY    . ESOPHAGOGASTRODUODENOSCOPY    . KNEE ARTHROSCOPY Right 2011  . TOTAL HIP ARTHROPLASTY Left 05/07/2013   Procedure: LEFT TOTAL HIP ARTHROPLASTY ANTERIOR APPROACH;  Surgeon: Mcarthur Rossetti, MD;  Location: [facility];  Service: Orthopedics;  Laterality: Left;  . TOTAL HIP ARTHROPLASTY Right   . TOTAL HIP REVISION Right 02/02/2015   Procedure: TOTAL HIP REVISION;  Surgeon: Kerin Salen, MD;  Location: [facility];  Service: Orthopedics;  Laterality: Right;   Social History   Occupational History  . Not on file  Tobacco Use  . Smoking status: Former Research scientist (life sciences)  . Smokeless tobacco: Never Used  . Tobacco comment: quit smoking 96yrs ago  Substance and Sexual Activity  . Alcohol use: No  . Drug use: No  . Sexual activity: Not Currently

## 2018-06-16 ENCOUNTER — Ambulatory Visit (HOSPITAL_COMMUNITY)
Admission: EM | Admit: 2018-06-16 | Discharge: 2018-06-16 | Disposition: A | Discharge status: Home / Self Care | Payer: Medicare Other | Attending: Family Medicine | Admitting: Family Medicine

## 2018-06-16 ENCOUNTER — Encounter (HOSPITAL_COMMUNITY): Payer: Self-pay | Admitting: Emergency Medicine

## 2018-06-16 DIAGNOSIS — L03113 Cellulitis of right upper limb: Secondary | ICD-10-CM | POA: Diagnosis not present

## 2018-06-16 DIAGNOSIS — W5501XA Bitten by cat, initial encounter: Secondary | ICD-10-CM

## 2018-06-16 MED ORDER — AMOXICILLIN-POT CLAVULANATE 875-125 MG PO TABS: 1.0000 | ORAL_TABLET | Freq: Two times a day (BID) | ORAL | 0 refills | Status: DC

## 2018-06-16 NOTE — ED Triage Notes (Addendum)
Cat bit/scrached with tooth to right forearm.  Redness swelling, warm to touch

## 2018-06-16 NOTE — Discharge Instructions (Addendum)
Gently wash the area with soap and water three times a day

## 2018-06-16 NOTE — ED Provider Notes (Addendum)
[facility]   [id number] 06/16/18 Arrival Time: 1201   SUBJECTIVE:  MALEKO GREULICH is a 77 y.o. male who presents to the urgent care with complaint of cat scratch on right arm that has turned red, swollen, and uncomfortable over the last 4 days.  The cat is the patient's and has had its immunizations.     Past Medical History:  Diagnosis Date  . Arthritis   . Barrett's esophagus   . GERD (gastroesophageal reflux disease)    takes Protonix daily  . History of colon polyps   . Hyperlipidemia    takes Zetia and Welchol daily  . Joint pain   . Muscle spasm    takes Robaxin daily as needed   No family history on file. Social History   Socioeconomic History  . Marital status: Married    Spouse name: Not on file  . Number of children: Not on file  . Years of education: Not on file  . Highest education level: Not on file  Occupational History  . Not on file  Social Needs  . Financial resource strain: Not on file  . Food insecurity:    Worry: Not on file    Inability: Not on file  . Transportation needs:    Medical: Not on file    Non-medical: Not on file  Tobacco Use  . Smoking status: Former Research scientist (life sciences)  . Smokeless tobacco: Never Used  . Tobacco comment: quit smoking 59yrs ago  Substance and Sexual Activity  . Alcohol use: No  . Drug use: No  . Sexual activity: Not Currently  Lifestyle  . Physical activity:    Days per week: Not on file    Minutes per session: Not on file  . Stress: Not on file  Relationships  . Social connections:    Talks on phone: Not on file    Gets together: Not on file    Attends religious service: Not on file    Active member of club or organization: Not on file    Attends meetings of clubs or organizations: Not on file    Relationship status: Not on file  . Intimate partner violence:    Fear of current or ex partner: Not on file    Emotionally abused: Not on file    Physically abused: Not on file    Forced sexual  activity: Not on file  Other Topics Concern  . Not on file  Social History Narrative  . Not on file   No outpatient medications have been marked as taking for the 06/16/18 encounter [facility] Encounter).   Allergies  Allergen Reactions  . Statins Other (See Comments)    Muscle pain      ROS: As per HPI, remainder of ROS negative.   OBJECTIVE:   There were no vitals filed for this visit.   General appearance: alert; no distress Eyes: PERRL; EOMI; conjunctiva normal HENT: normocephalic; atraumatic; TMs normal, canal normal, external ears normal without trauma; nasal mucosa normal; oral mucosa normal Neck: supple Skin: warm and dry; red and swollen 10 cm area of dorsal right forearm with central excoriation Neurologic: normal gait; grossly normal Psychological: alert and cooperative; normal mood and affect      Labs:  Results for orders placed or performed during the hospital encounter of 04/02/17  Urinalysis, Routine w reflex microscopic  Result Value Ref Range   Color, Urine STRAW (A) YELLOW   APPearance CLEAR CLEAR   Specific Gravity, Urine 1.005 1.005 -  1.030   pH 7.0 5.0 - 8.0   Glucose, UA NEGATIVE NEGATIVE mg/dL   Hgb urine dipstick SMALL (A) NEGATIVE   Bilirubin Urine NEGATIVE NEGATIVE   Ketones, ur NEGATIVE NEGATIVE mg/dL   Protein, ur NEGATIVE NEGATIVE mg/dL   Nitrite NEGATIVE NEGATIVE   Leukocytes, UA NEGATIVE NEGATIVE   RBC / HPF 0-5 0 - 5 RBC/hpf   WBC, UA NONE SEEN 0 - 5 WBC/hpf   Bacteria, UA NONE SEEN NONE SEEN   Squamous Epithelial / LPF NONE SEEN NONE SEEN    Labs Reviewed - No data to display  No results found.     ASSESSMENT & PLAN:  1. Cat bite, initial encounter   2. Cellulitis of right upper extremity     Meds ordered this encounter  Medications  . amoxicillin-clavulanate (AUGMENTIN) 875-125 MG tablet    Sig: Take 1 tablet by mouth every 12 (twelve) hours.    Dispense:  14 tablet    Refill:  0    Reviewed expectations  re: course of current medical issues. Questions answered. Outlined signs and symptoms indicating need for more acute intervention. Patient verbalized understanding. After Visit Summary given.    Procedures:      Robyn Haber, MD 06/16/18 [id number]    Robyn Haber, MD 06/16/18 1223

## 2018-06-20 ENCOUNTER — Telehealth (INDEPENDENT_AMBULATORY_CARE_PROVIDER_SITE_OTHER): Payer: Self-pay | Admitting: Orthopedic Surgery

## 2018-06-20 DIAGNOSIS — K625 Hemorrhage of anus and rectum: Secondary | ICD-10-CM | POA: Diagnosis not present

## 2018-06-20 DIAGNOSIS — M25552 Pain in left hip: Secondary | ICD-10-CM | POA: Diagnosis not present

## 2018-06-20 DIAGNOSIS — E279 Disorder of adrenal gland, unspecified: Secondary | ICD-10-CM | POA: Diagnosis not present

## 2018-06-20 DIAGNOSIS — C61 Malignant neoplasm of prostate: Secondary | ICD-10-CM | POA: Diagnosis not present

## 2018-06-20 DIAGNOSIS — K649 Unspecified hemorrhoids: Secondary | ICD-10-CM | POA: Diagnosis not present

## 2018-06-20 DIAGNOSIS — M545 Low back pain: Secondary | ICD-10-CM | POA: Diagnosis not present

## 2018-06-20 DIAGNOSIS — Z5111 Encounter for antineoplastic chemotherapy: Secondary | ICD-10-CM | POA: Diagnosis not present

## 2018-06-20 DIAGNOSIS — R590 Localized enlarged lymph nodes: Secondary | ICD-10-CM | POA: Diagnosis not present

## 2018-06-20 DIAGNOSIS — N289 Disorder of kidney and ureter, unspecified: Secondary | ICD-10-CM | POA: Diagnosis not present

## 2018-06-20 DIAGNOSIS — Z79899 Other long term (current) drug therapy: Secondary | ICD-10-CM | POA: Diagnosis not present

## 2018-06-20 DIAGNOSIS — C7951 Secondary malignant neoplasm of bone: Secondary | ICD-10-CM | POA: Diagnosis not present

## 2018-06-20 NOTE — Telephone Encounter (Signed)
[name] is calling to check on the status of his lumbar MRI.  He and his wife checked with [facility] and they stated that they did not have an order and that patient needed to call our office.  There is an MRI referral in the chart, can you please check on this? Patient was wondering if we were having trouble getting authorization from his insurance company.

## 2018-06-20 NOTE — Telephone Encounter (Signed)
IC and sw pt and there was confusion on what [facility] was needing, I contacted [facility] and she stated the order needed to be with or with out contrast or just without. Unless an arthorgram they do not do with contrast. I changed there order to with and without, Prentiss Bells is going to call pt to schedule appt

## 2018-06-24 ENCOUNTER — Ambulatory Visit
Admission: RE | Admit: 2018-06-24 | Discharge: 2018-06-24 | Disposition: A | Discharge status: Home / Self Care | Payer: Medicare Other | Source: Ambulatory Visit | Attending: Orthopedic Surgery | Admitting: Orthopedic Surgery

## 2018-06-24 DIAGNOSIS — M5126 Other intervertebral disc displacement, lumbar region: Secondary | ICD-10-CM | POA: Diagnosis not present

## 2018-06-24 DIAGNOSIS — G8929 Other chronic pain: Secondary | ICD-10-CM

## 2018-06-24 DIAGNOSIS — M5442 Lumbago with sciatica, left side: Principal | ICD-10-CM

## 2018-06-24 MED ORDER — GADOBENATE DIMEGLUMINE 529 MG/ML IV SOLN
18.0000 mL | Freq: Once | INTRAVENOUS | Status: AC | PRN
  Administered 2018-06-24: 18 mL via INTRAVENOUS

## 2018-06-25 ENCOUNTER — Other Ambulatory Visit (INDEPENDENT_AMBULATORY_CARE_PROVIDER_SITE_OTHER): Payer: Self-pay

## 2018-06-25 DIAGNOSIS — M5442 Lumbago with sciatica, left side: Principal | ICD-10-CM

## 2018-06-25 DIAGNOSIS — G8929 Other chronic pain: Secondary | ICD-10-CM

## 2018-06-25 NOTE — Progress Notes (Signed)
Per Dr Marlou Sa, referral placed for patient to see Dr [name] or Dr Ronnald Ramp after notified of findings of MRI scan of lumbar spine. Dr Marlou Sa spoke with patient and advised.

## 2018-06-28 ENCOUNTER — Telehealth (INDEPENDENT_AMBULATORY_CARE_PROVIDER_SITE_OTHER): Payer: Self-pay | Admitting: Orthopedic Surgery

## 2018-06-28 NOTE — Telephone Encounter (Signed)
Ok to rf? 

## 2018-06-28 NOTE — Telephone Encounter (Signed)
Mr. Spisak called in requesting a refill of Percocet.  He states that his appointment with the neurosurgeon is not until 07/10/2018 and he will be running out of meds by this weekend.

## 2018-06-29 MED ORDER — OXYCODONE-ACETAMINOPHEN 5-325 MG PO TABS: 1.0000 | ORAL_TABLET | Freq: Four times a day (QID) | ORAL | 0 refills | Status: DC | PRN

## 2018-06-29 NOTE — Telephone Encounter (Signed)
y

## 2018-06-29 NOTE — Addendum Note (Signed)
Addended byLaurann Montana on: 06/29/2018 08:43 AM   Modules accepted: Orders

## 2018-06-29 NOTE — Telephone Encounter (Signed)
IC advised could pick up rx to take to pharmacy at front desk.

## 2018-07-10 DIAGNOSIS — C7951 Secondary malignant neoplasm of bone: Secondary | ICD-10-CM | POA: Diagnosis not present

## 2018-07-10 DIAGNOSIS — C61 Malignant neoplasm of prostate: Secondary | ICD-10-CM | POA: Diagnosis not present

## 2018-07-10 DIAGNOSIS — M5416 Radiculopathy, lumbar region: Secondary | ICD-10-CM | POA: Diagnosis not present

## 2018-07-12 ENCOUNTER — Other Ambulatory Visit: Payer: Self-pay | Admitting: Neurosurgery

## 2018-07-12 DIAGNOSIS — M5416 Radiculopathy, lumbar region: Secondary | ICD-10-CM

## 2018-07-13 ENCOUNTER — Ambulatory Visit
Admission: RE | Admit: 2018-07-13 | Discharge: 2018-07-13 | Disposition: A | Discharge status: Home / Self Care | Payer: Medicare Other | Source: Ambulatory Visit | Attending: Neurosurgery | Admitting: Neurosurgery

## 2018-07-13 ENCOUNTER — Telehealth (INDEPENDENT_AMBULATORY_CARE_PROVIDER_SITE_OTHER): Payer: Self-pay | Admitting: Orthopedic Surgery

## 2018-07-13 DIAGNOSIS — M47817 Spondylosis without myelopathy or radiculopathy, lumbosacral region: Secondary | ICD-10-CM | POA: Diagnosis not present

## 2018-07-13 DIAGNOSIS — M5416 Radiculopathy, lumbar region: Secondary | ICD-10-CM

## 2018-07-13 MED ORDER — OXYCODONE-ACETAMINOPHEN 5-325 MG PO TABS
1.0000 | ORAL_TABLET | Freq: Once | ORAL | Status: AC
  Administered 2018-07-13: 1 via ORAL

## 2018-07-13 MED ORDER — IOPAMIDOL (ISOVUE-M 200) INJECTION 41%
1.0000 mL | Freq: Once | INTRAMUSCULAR | Status: AC
  Administered 2018-07-13: 1 mL via EPIDURAL

## 2018-07-13 MED ORDER — METHYLPREDNISOLONE ACETATE 40 MG/ML INJ SUSP (RADIOLOG
120.0000 mg | Freq: Once | INTRAMUSCULAR | Status: AC
  Administered 2018-07-13: 120 mg via EPIDURAL

## 2018-07-13 NOTE — Discharge Instructions (Signed)

## 2018-07-13 NOTE — Telephone Encounter (Signed)
Please advise 

## 2018-07-13 NOTE — Telephone Encounter (Signed)
Patient's wife Bethena Roys) called advised patient need Rx refilled (Oxycodone) She said patient was taking 3 a day because of the pain. The  number to contact Bethena Roys is [phone] or [phone] Patient

## 2018-07-13 NOTE — Telephone Encounter (Signed)
Ok pls calal thx

## 2018-07-16 MED ORDER — OXYCODONE-ACETAMINOPHEN 5-325 MG PO TABS: 1.0000 | ORAL_TABLET | Freq: Four times a day (QID) | ORAL | 0 refills | Status: DC | PRN

## 2018-07-16 NOTE — Telephone Encounter (Signed)
Put up front for patient to pick up.  

## 2018-07-16 NOTE — Telephone Encounter (Signed)
IC pt will pick up after 130

## 2018-07-18 ENCOUNTER — Telehealth: Payer: Self-pay | Admitting: Oncology

## 2018-07-18 ENCOUNTER — Encounter: Payer: Self-pay | Admitting: Oncology

## 2018-07-18 DIAGNOSIS — Z79899 Other long term (current) drug therapy: Secondary | ICD-10-CM | POA: Diagnosis not present

## 2018-07-18 DIAGNOSIS — K625 Hemorrhage of anus and rectum: Secondary | ICD-10-CM | POA: Diagnosis not present

## 2018-07-18 DIAGNOSIS — M25552 Pain in left hip: Secondary | ICD-10-CM | POA: Diagnosis not present

## 2018-07-18 DIAGNOSIS — N289 Disorder of kidney and ureter, unspecified: Secondary | ICD-10-CM | POA: Diagnosis not present

## 2018-07-18 DIAGNOSIS — C7951 Secondary malignant neoplasm of bone: Secondary | ICD-10-CM | POA: Diagnosis not present

## 2018-07-18 DIAGNOSIS — C61 Malignant neoplasm of prostate: Secondary | ICD-10-CM | POA: Diagnosis not present

## 2018-07-18 DIAGNOSIS — M545 Low back pain: Secondary | ICD-10-CM | POA: Diagnosis not present

## 2018-07-18 DIAGNOSIS — Z5111 Encounter for antineoplastic chemotherapy: Secondary | ICD-10-CM | POA: Diagnosis not present

## 2018-07-18 NOTE — Telephone Encounter (Signed)
New referral received from Dr. [name] at [facility]. Pt is wanting to transfer care closer to home. Pt has been scheduled to see Dr. [name] on 8/6 at 2pm. Address verified. Pt aware to arrive 30 minutes early.

## 2018-07-19 ENCOUNTER — Other Ambulatory Visit (HOSPITAL_COMMUNITY): Payer: Self-pay | Admitting: Hematology and Oncology

## 2018-07-19 DIAGNOSIS — C61 Malignant neoplasm of prostate: Secondary | ICD-10-CM

## 2018-07-23 ENCOUNTER — Ambulatory Visit (HOSPITAL_COMMUNITY)
Admission: RE | Admit: 2018-07-23 | Discharge: 2018-07-23 | Disposition: A | Discharge status: Home / Self Care | Payer: Medicare Other | Source: Ambulatory Visit | Attending: Hematology and Oncology | Admitting: Hematology and Oncology

## 2018-07-23 DIAGNOSIS — C61 Malignant neoplasm of prostate: Secondary | ICD-10-CM | POA: Insufficient documentation

## 2018-07-23 DIAGNOSIS — C7951 Secondary malignant neoplasm of bone: Secondary | ICD-10-CM | POA: Diagnosis not present

## 2018-07-23 DIAGNOSIS — C772 Secondary and unspecified malignant neoplasm of intra-abdominal lymph nodes: Secondary | ICD-10-CM | POA: Insufficient documentation

## 2018-07-23 DIAGNOSIS — M4856XA Collapsed vertebra, not elsewhere classified, lumbar region, initial encounter for fracture: Secondary | ICD-10-CM | POA: Insufficient documentation

## 2018-07-23 DIAGNOSIS — C7972 Secondary malignant neoplasm of left adrenal gland: Secondary | ICD-10-CM | POA: Insufficient documentation

## 2018-07-23 MED ORDER — IOPAMIDOL (ISOVUE-300) INJECTION 61%
100.0000 mL | Freq: Once | INTRAVENOUS | Status: AC | PRN
  Administered 2018-07-23: 100 mL via INTRAVENOUS

## 2018-07-23 MED ORDER — IOPAMIDOL (ISOVUE-300) INJECTION 61%
INTRAVENOUS | Status: AC
  Filled 2018-07-23: qty 100

## 2018-07-24 ENCOUNTER — Encounter: Payer: Self-pay | Admitting: Medical Oncology

## 2018-07-24 ENCOUNTER — Inpatient Hospital Stay: Payer: Medicare Other | Attending: Oncology | Admitting: Oncology

## 2018-07-24 ENCOUNTER — Telehealth: Payer: Self-pay | Admitting: Oncology

## 2018-07-24 DIAGNOSIS — C7970 Secondary malignant neoplasm of unspecified adrenal gland: Secondary | ICD-10-CM

## 2018-07-24 DIAGNOSIS — C7951 Secondary malignant neoplasm of bone: Secondary | ICD-10-CM

## 2018-07-24 DIAGNOSIS — C61 Malignant neoplasm of prostate: Secondary | ICD-10-CM | POA: Diagnosis not present

## 2018-07-24 DIAGNOSIS — Z79899 Other long term (current) drug therapy: Secondary | ICD-10-CM

## 2018-07-24 NOTE — Progress Notes (Signed)
Introduced myself to patient and his family as the prostate nurse navigator and my role. [name] has been receiving treatment at [facility] for his prostate cancer but is transferring his care to [facility] to be closer to home. He has recently completed [facility] and received printed patient information on Zytiga today. He has bone mets and has pain in his lower back and legs making it difficult to walk any distance. He is able to use a cane to move around house. Dr.Shadad is making a referral to radiation oncology for consult to address his bone mets. I gave him my business card and asked him to call me with questions or concerns.

## 2018-07-24 NOTE — Telephone Encounter (Signed)
Scheduled appt per 8/6 los - gave patient AVS and calender per los.

## 2018-07-24 NOTE — Progress Notes (Signed)
Reason for the request: Prostate cancer  HPI: I was asked by Dr. Shelia Media   to evaluate [name] for prostate cancer.  He is a pleasant 77 year old man diagnosed with prostate cancer in 2015.  At that time he presented there with a PSA of 7.11 and had a biopsy done on July 2016 which showed a Gleason score of 5+4 = 9 high-volume and high risk prostate cancer.  He was treated there with external beam radiation and brachii therapy in January 2017 and completed 2 years of androgen deprivation.  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  Metastatic work-up at that time showed bone involvement as well as lymphadenopathy.  He was treated under the care of Dr. [name] at [facility] initially with enzalutamide started in May 2018 until January 2019 where he presented with progression of disease with bony metastasis, adrenal nodule as well as worsening adenopathy.  At that time he was treated with Taxotere chemotherapy initially at 60 mg/m and that was reduced to 45 mg/m after cycle 6.  His last treatment was in July 2019 given at [facility].  Repeat imaging studies showed his lymphadenopathy has improved but he did develop worsening bony metastasis based on a lumbar spine obtained on June 24, 2018.  The MRI showed a complete replacement of L3 vertebral body with tumor the tumor in the ventral epidural space behind L3 greater to the left.  Bulky retroperitoneal adenopathy was slightly improved as mentioned.  Patient would like to switch his care to [name] for convenience.  His last Lupron was given and on January 22, 2018 under the care of Dr. [name] and has received 45 mg at that time.  And he has also been receiving denosumab every 6 weeks with last treatment was on 07/18/2018.  He had a repeat chest CT scan on 07/23/2018 of the abdomen and pelvis. His adenopathy persists and his bone disease has not progressed  since that time.  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  Clinically, he reports his main concern and issue is left lower back pain that radiates down his leg.  He is able to ambulate with the help of a cane without any falls but he cannot walk for an extended period of time because of the pain.  He does take Percocet 1 to 2 tablets 3 times a day to alleviate that pain.  He denies any weakness or loss of bowel or bladder function.  He was evaluated by Dr. [name] and refused epidural injection without any improvement in his symptoms.  He does not report any headaches, blurry vision, syncope or seizures. Does not report any fevers, chills or sweats.  Does not report any cough, wheezing or hemoptysis.  Does not report any chest pain, palpitation, orthopnea or leg edema.  Does not report any nausea, vomiting or abdominal pain.  Does not report any constipation or diarrhea.  Does not report any skeletal complaints.    Does not report frequency, urgency or hematuria.  Does not report any skin rashes or lesions. Does not report any heat or cold intolerance.  Does not report any lymphadenopathy or petechiae.  Does not report any anxiety or depression.  Remaining review of systems is negative.    Past Medical History:  Diagnosis Date  . Arthritis   . Barrett's esophagus   . GERD (gastroesophageal reflux disease)    takes Protonix  daily  . History of colon polyps   . Hyperlipidemia    takes Zetia and Welchol daily  . Joint pain   . Muscle spasm    takes Robaxin daily as needed  :  Past Surgical History:  Procedure Laterality Date  . COLONOSCOPY    . ESOPHAGOGASTRODUODENOSCOPY    . KNEE ARTHROSCOPY Right 2011  . TOTAL HIP ARTHROPLASTY Left 05/07/2013   Procedure: LEFT TOTAL HIP ARTHROPLASTY ANTERIOR APPROACH;  Surgeon: Mcarthur Rossetti, MD;  Location: [place];  Service: Orthopedics;  Laterality: Left;  . TOTAL HIP ARTHROPLASTY Right   . TOTAL HIP REVISION Right 02/02/2015    Procedure: TOTAL HIP REVISION;  Surgeon: Kerin Salen, MD;  Location: [place];  Service: Orthopedics;  Laterality: Right;  :   Current Outpatient Medications:  .  amoxicillin-clavulanate (AUGMENTIN) 875-125 MG tablet, Take 1 tablet by mouth every 12 (twelve) hours., Disp: 14 tablet, Rfl: 0 .  aspirin EC 81 MG tablet, Take 81 mg by mouth daily after supper. , Disp: , Rfl:  .  ezetimibe (ZETIA) 10 MG tablet, Take 10 mg by mouth daily after supper. , Disp: , Rfl:  .  ibuprofen (ADVIL,MOTRIN) 200 MG tablet, Take 400 mg by mouth every 6 (six) hours as needed for moderate pain., Disp: , Rfl:  .  methocarbamol (ROBAXIN) 500 MG tablet, Take 1 tablet (500 mg total) by mouth 4 (four) times daily., Disp: 20 tablet, Rfl: 0 .  oxyCODONE-acetaminophen (PERCOCET/ROXICET) 5-325 MG tablet, Take 0.5-1 tablets by mouth every 6 (six) hours as needed for severe pain., Disp: 12 tablet, Rfl: 0 .  oxyCODONE-acetaminophen (PERCOCET/ROXICET) 5-325 MG tablet, 1 po q 8 hrs prn pain, Disp: 45 tablet, Rfl: 0 .  oxyCODONE-acetaminophen (PERCOCET/ROXICET) 5-325 MG tablet, Take 1 tablet by mouth every 6 (six) hours as needed for severe pain., Disp: 45 tablet, Rfl: 0 .  pantoprazole (PROTONIX) 40 MG tablet, Take 40 mg by mouth daily after supper. , Disp: , Rfl:  .  tamsulosin (FLOMAX) 0.4 MG CAPS capsule, Take 0.4 mg by mouth daily. , Disp: , Rfl: :  Allergies  Allergen Reactions  . Statins Other (See Comments)    Muscle pain  :  No family history on file.:  Social History   Socioeconomic History  . Marital status: Married    Spouse name: Not on file  . Number of children: Not on file  . Years of education: Not on file  . Highest education level: Not on file  Occupational History  . Not on file  Social Needs  . Financial resource strain: Not on file  . Food insecurity:    Worry: Not on file    Inability: Not on file  . Transportation needs:    Medical: Not on file    Non-medical: Not on file  Tobacco Use  .  Smoking status: Former Research scientist (life sciences)  . Smokeless tobacco: Never Used  . Tobacco comment: quit smoking 94yrs ago  Substance and Sexual Activity  . Alcohol use: No  . Drug use: No  . Sexual activity: Not Currently  Lifestyle  . Physical activity:    Days per week: Not on file    Minutes per session: Not on file  . Stress: Not on file  Relationships  . Social connections:    Talks on phone: Not on file    Gets together: Not on file    Attends religious service: Not on file    Active member of club or organization: Not on  file    Attends meetings of clubs or organizations: Not on file    Relationship status: Not on file  . Intimate partner violence:    Fear of current or ex partner: Not on file    Emotionally abused: Not on file    Physically abused: Not on file    Forced sexual activity: Not on file  Other Topics Concern  . Not on file  Social History Narrative  . Not on file  :  Pertinent items are noted in HPI.  Exam: Blood pressure 124/78, pulse 96, temperature 98.8 F (37.1 C), temperature source Oral, resp. rate 18, height 5\' 11"  (1.803 m), weight 192 lb 1.6 oz (87.1 kg), SpO2 98 %.   General appearance: alert and cooperative appeared without distress. Head: atraumatic without any abnormalities. Eyes: conjunctivae/corneas clear. PERRL.  Sclera anicteric. Throat: lips, mucosa, and tongue normal; without oral thrush or ulcers. Resp: clear to auscultation bilaterally without rhonchi, wheezes or dullness to percussion. Cardio: regular rate and rhythm, S1, S2 normal, no murmur, click, rub or gallop GI: soft, non-tender; bowel sounds normal; no masses,  no organomegaly Skin: Skin color, texture, turgor normal. No rashes or lesions Lymph nodes: Cervical, supraclavicular, and axillary nodes normal. Neurologic: Grossly normal without any motor, sensory or deep tendon reflexes. Musculoskeletal: No joint deformity or effusion.    Mr Lumbar Spine W Wo Contrast  Result Date:  06/24/2018 CLINICAL DATA:  LEFT hip and leg pain. Prostate cancer. History of compression fracture. Back pain. Symptoms for 2 months. EXAM: MRI LUMBAR SPINE WITHOUT AND WITH CONTRAST TECHNIQUE: Multiplanar and multiecho pulse sequences of the lumbar spine were obtained without and with intravenous contrast. CONTRAST:  72mL MULTIHANCE GADOBENATE DIMEGLUMINE 529 MG/ML IV SOLN COMPARISON:  04/11/2017 MR lumbar. FINDINGS: Segmentation:  Standard. Alignment: Degenerative anterolisthesis L2-3 of 2 mm. Lower lumbar deformity related to chronic L5 compression. Vertebrae: Overall in the lumbar spine and visualized lower thoracic and upper sacral regions, fatty replaced marrow consistent with post treatment effect (XRT). Since the prior scan, development of near complete replacement of the L3 vertebral body with tumor, extending into the LEFT greater than RIGHT pedicles. No significant pathologic compression at this time although patient is at risk for such. Stable hemangioma L4. Healed L5 compression fracture, loss of 50% vertebral body height, slight retropulsion. Inferiorly projecting Schmorl's node from L4-5 demonstrates mild postcontrast enhancement, non worrisome. Conus medullaris and cauda equina: Conus extends to the L1-L2 level. Conus and cauda equina appear normal. 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 Bulky retroperitoneal adenopathy, greatest opposite L3 on the RIGHT and LEFT. Disc levels: L1-L2:  Central protrusion.  No impingement. L2-L3: 2 mm anterolisthesis. Central protrusion. Posterior element hypertrophy. Mild stenosis at the interspace level, but epidural tumor ventrally LEFT greater than RIGHT contributes to moderate stenosis just inferior to the disc space. BILATERAL subarticular zone narrowing likely affects the L3 nerve roots, greater on the LEFT. No compressive foraminal narrowing of significance. L3-L4: Trace anterolisthesis. Annular bulge.  Epidural tumor extends caudally within the canal to just above the L3-4 interspace, and extends posterolaterally into the LEFT extraforaminal space. Moderate to severe stenosis. LEFT greater than RIGHT L4 nerve root impingement. LEFT L3 neural impingement beyond the foramen is suspected. L4-L5: Trace anterolisthesis. Central protrusion. Posterior element hypertrophy. Mild stenosis. BILATERAL L4 and L5 nerve root impingement. L5-S1: Good disc height and hydration. Annular bulge. Facet arthropathy. No impingement. IMPRESSION: Near complete replacement of the L3 vertebral body with  tumor, consistent with metastatic prostate cancer. Significant progression since prior MR of 04/11/2017. Tumor in the ventral epidural space behind L3, greater on the LEFT, potentially affecting the L3 and L4 nerve roots, see discussion above. Bulky retroperitoneal adenopathy, stable to slightly improved. Healed L5 compression fracture. These results will be called to the ordering clinician or representative by the Radiologist Assistant, and communication documented in the PACS or zVision Dashboard. Electronically Signed   By: Staci Righter M.D.   On: 06/24/2018 14:46   Ct Abdomen Pelvis W Contrast  Result Date: 07/23/2018 CLINICAL DATA:  Prostate cancer with bone metastases, XRT complete, chemotherapy ongoing EXAM: CT CHEST, ABDOMEN, AND PELVIS WITH CONTRAST TECHNIQUE: Multidetector CT imaging of the chest, abdomen and pelvis was performed following the standard protocol during bolus administration of intravenous contrast. CONTRAST:  155mL ISOVUE-300 IOPAMIDOL (ISOVUE-300) INJECTION 61% COMPARISON:  Partial comparison CT lumbar spine dated 04/02/2017. FINDINGS: CT CHEST FINDINGS Cardiovascular: Heart is normal in size.  No pericardial effusion. No evidence of thoracic aortic aneurysm. Mild atherosclerotic calcifications of the aortic root/arch. Mild coronary atherosclerosis of the LAD and right coronary artery. Mediastinum/Nodes: Small  mediastinal lymph nodes which do not meet pathologic CT size criteria. No suspicious axillary lymphadenopathy. Visualized thyroid is unremarkable. Lungs/Pleura: Mild biapical pleural-parenchymal scarring. Mild pleural-parenchymal scarring in the posterior right upper and bilateral lower lobes. Mild subpleural reticulation/fibrosis in the lungs bilaterally, particularly the anterior left upper lobe and posterior bilateral lower lobes. No suspicious pulmonary nodules. No focal consolidation. No pleural effusion or pneumothorax. Musculoskeletal: Degenerative changes of the thoracic spine. No focal osseous lesions. CT ABDOMEN PELVIS FINDINGS Hepatobiliary: Liver is within normal limits. Gallbladder is unremarkable. No intrahepatic or extrahepatic ductal dilatation. Pancreas: Within normal limits. Spleen: Within normal limits. Adrenals/Urinary Tract: 3.4 cm left adrenal mass (series 2/image 86), previously 1.3 cm on prior lumbar spine CT, suspicious for metastasis. Right adrenal gland is within normal limits. 9.5 cm lateral left lower pole renal cyst. 3.2 cm anterior interpolar right renal cyst (series 2/image 32). No hydronephrosis. Bladder is underdistended and partially obscured by streak artifact. Stomach/Bowel: Stomach is within normal limits. No evidence of bowel obstruction. Normal appendix (series 2/image 104). Mild left colonic stool burden. Vascular/Lymphatic: No evidence of abdominal aortic aneurysm. Atherosclerotic calcifications of the abdominal aorta and branch vessels. 1.8 cm right common iliac node (series 2/image 90). 1.8 cm bilateral para-aortic nodes (series 2/image 78). Reproductive: Brachytherapy seeds in the prostate. Other: No abdominopelvic ascites. Musculoskeletal: Bilateral hip arthroplasties, without evidence of complication. Mild superior endplate compression fracture deformity at L3. Associated sclerosis in the vertebral body, suggesting pathologic fracture. Moderate superior endplate  compression fracture deformity at L5. No retropulsion. These are better evaluated on prior MRI. IMPRESSION: Brachytherapy seeds in the prostate. Retroperitoneal nodal metastases, as above. Left adrenal metastasis, progressive from 2018. Mild superior endplate pathologic compression fracture at L3. Moderate superior endplate compression fracture deformity at L5. These are unchanged from prior MRI. No evidence of metastatic disease in the chest. Electronically Signed   By: Julian Hy M.D.   On: 07/23/2018 10:21      Assessment and Plan:   77 year old man with the following:  1.  Castration-resistant prostate cancer diagnosed in 2016.  At that time he presented with a Gleason score 5+4 = 9 PSA 13.56 with high-volume localized disease.  He was treated with external beam radiation and brachytherapy in January 2017 and completed 2 years of androgen deprivation.  He subsequently developed castration resistant and was treated with enzalutamide in May  2019.  He developed progression of disease in January 2019 and received docetaxel chemotherapy between February 2019 and July 2019.  His last PSA on July 18, 2018 was 21.57 which has increased from 19.8 in July 3 and 14.98 on May 30, 2018.  CT scan obtained on July 23, 2018 were personally reviewed and discussed with the patient.  His disease showed lymphadenopathy and enlarging adrenal metastasis compared to 2018.  However, these adrenal metastasis as well as lymphadenopathy.  Stable compared to his imaging studies done at [facility] in April 2019.  The natural course of this disease was reviewed today with the patient and treatment options were reviewed.  He will require different salvage therapy at this time given the slow rise in his PSA although his overall has stable disease.  These options would include Zytiga, [name] chemotherapy or Xofigo.  I favor a trial of Zytiga at this time given his recent exposure to chemotherapy I  left him with few complications including generalized weakness and fatigue.  He would be a good candidate for retreatment with chemotherapy in the form of [name].  Anticipate the start of Zytiga in the next 3 to 4 weeks after he addresses his lumbar spine metastasis.  Written information was given to the patient today as well as information about complications associated with Zytiga.  His complications including fatigue, tiredness, hypertension and hypokalemia.  Obtaining tissue for next generation sequencing for a targeted therapy would be also an option for him after obtaining tissue from his L3 tumor.  2.  Spinal metastasis: I recommended proceeding with surgical approach to treat his L3 metastasis also help obtain tissue for further studying of his prostate cancer potential future treatment options.  I will also refer him to radiation oncology for evaluation as he would likely require radiation postoperatively.  3.  Bone directed therapy: He is currently on Xgeva which will be resumed in September 2019.  4.  Androgen deprivation: He continues to receive that under the care of Dr. [name].  I recommended continuing that indefinitely.  5.  Pain: Related to his spinal metastasis: He is currently using Percocet which have helped his pain and might require longer acting pain medication if there is no definitive treatment for his spinal metastasis is done.  6.  Follow-up: We will be in the next 4 weeks to follow his progress.  60  minutes was spent with the patient face-to-face today.  More than 50% of time was dedicated to patient counseling, education and discussing the natural course of this disease and treatment options.     Thank you for the referral. A copy of this consult has been forwarded to the requesting physician.

## 2018-07-30 ENCOUNTER — Telehealth: Payer: Self-pay | Admitting: *Deleted

## 2018-07-30 DIAGNOSIS — R972 Elevated prostate specific antigen [PSA]: Secondary | ICD-10-CM | POA: Diagnosis not present

## 2018-07-30 DIAGNOSIS — Z8042 Family history of malignant neoplasm of prostate: Secondary | ICD-10-CM | POA: Diagnosis not present

## 2018-07-30 DIAGNOSIS — C7951 Secondary malignant neoplasm of bone: Secondary | ICD-10-CM | POA: Diagnosis not present

## 2018-07-30 DIAGNOSIS — R590 Localized enlarged lymph nodes: Secondary | ICD-10-CM | POA: Diagnosis not present

## 2018-07-30 DIAGNOSIS — C61 Malignant neoplasm of prostate: Secondary | ICD-10-CM | POA: Diagnosis not present

## 2018-07-30 NOTE — Telephone Encounter (Signed)
Received voice mail message from [place], [place] needs to see if Dr. [name] could write a new prescription for Prednisone. Dr. [name] at [facility] wrote the prescription but I think Dr. [name] said something about keeping him on it. We use [facility] on [address]. Return number is [phone].

## 2018-07-31 ENCOUNTER — Other Ambulatory Visit: Payer: Self-pay | Admitting: *Deleted

## 2018-07-31 ENCOUNTER — Telehealth: Payer: Self-pay | Admitting: *Deleted

## 2018-07-31 MED ORDER — PREDNISONE 5 MG PO TABS: 5.0000 mg | ORAL_TABLET | Freq: Two times a day (BID) | ORAL | 2 refills | Status: DC

## 2018-07-31 NOTE — Telephone Encounter (Signed)
Ok to refill 

## 2018-07-31 NOTE — Telephone Encounter (Signed)
Spoke with patient, confirmed that he takes 5 mg prednisone daily. New script sent to patient's pharmacy.

## 2018-08-01 ENCOUNTER — Telehealth: Payer: Self-pay | Admitting: Pharmacist

## 2018-08-01 NOTE — Telephone Encounter (Signed)
Oral Chemotherapy Pharmacist Encounter  Successfully enrolled patient for copayment assistance funds from [employer] ([employer]) from the [place] amount: will cover all costs for Zytiga during covered period  Effective dates: 12/29/2017 - 12/18/2018 ID: [id number] BIN: [id number] [id number] PCN: [id number]  Billing information will be shared the [facility].  Johny Drilling, PharmD, BCPS, BCOP 08/01/2018 7:58 AM Oral Oncology Clinic [phone]

## 2018-08-02 ENCOUNTER — Other Ambulatory Visit: Payer: Self-pay | Admitting: Neurosurgery

## 2018-08-02 ENCOUNTER — Encounter (HOSPITAL_COMMUNITY): Payer: Self-pay | Admitting: *Deleted

## 2018-08-02 ENCOUNTER — Other Ambulatory Visit: Payer: Self-pay

## 2018-08-02 DIAGNOSIS — C61 Malignant neoplasm of prostate: Secondary | ICD-10-CM | POA: Diagnosis not present

## 2018-08-02 DIAGNOSIS — Z6826 Body mass index (BMI) 26.0-26.9, adult: Secondary | ICD-10-CM | POA: Diagnosis not present

## 2018-08-02 DIAGNOSIS — M5440 Lumbago with sciatica, unspecified side: Secondary | ICD-10-CM | POA: Diagnosis not present

## 2018-08-02 DIAGNOSIS — R030 Elevated blood-pressure reading, without diagnosis of hypertension: Secondary | ICD-10-CM | POA: Diagnosis not present

## 2018-08-02 NOTE — Progress Notes (Signed)
Histology and Location of Primary Cancer: castration resistant prostate cancer diagnosed in 2016.   Sites of Visceral and Bony Metastatic Disease: bony mets, lymphadenopathy, adrenal nodules  Location(s) of Symptomatic Metastases: L3  Past/Anticipated chemotherapy by medical oncology, if any: He was treated under the care of Dr. [name] at [facility] initially with enzalutamide started in May 2018 until January 2019 where he presented with progression of disease with bony metastasis, adrenal nodule as well as worsening adenopathy.  At that time he was treated with Taxotere chemotherapy initially at 60 mg/m and that was reduced to 45 mg/m after cycle 6. His last Lupron was given and on January 22, 2018 under the care of Dr. [name] and has received 45 mg at that time.  And he has also been receiving denosumab every 6 weeks with last treatment was on 07/18/2018.   Pain on a scale of 0-10 is: pain in his lower back and legs making it difficult to walk any distance. He is able to use a cane to move around house   If Spine Met(s), symptoms, if any, include:  Bowel/Bladder retention or incontinence (please describe):   Numbness or weakness in extremities (please describe):   Current Decadron regimen, if applicable:   Ambulatory status? Walker? Wheelchair?: Ambulatory with aid of cane  SAFETY ISSUES:  Prior radiation? Yes, external beam and brachytherapy in 2017  Pacemaker/ICD?   Possible current pregnancy? no  Is the patient on methotrexate?   Current Complaints / other details:  77 year old male. Referred for consideration of postoperative radiation to L3 or Xofigo???

## 2018-08-06 ENCOUNTER — Ambulatory Visit
Admission: RE | Admit: 2018-08-06 | Discharge: 2018-08-06 | Disposition: A | Discharge status: Home / Self Care | Payer: Medicare Other | Source: Ambulatory Visit | Attending: Radiation Oncology | Admitting: Radiation Oncology

## 2018-08-06 ENCOUNTER — Ambulatory Visit (HOSPITAL_COMMUNITY): Payer: Medicare Other

## 2018-08-06 ENCOUNTER — Ambulatory Visit (HOSPITAL_COMMUNITY): Payer: Medicare Other | Admitting: Anesthesiology

## 2018-08-06 ENCOUNTER — Encounter (HOSPITAL_COMMUNITY)
Admission: RE | Disposition: A | Discharge status: Home / Self Care | Payer: Self-pay | Source: Ambulatory Visit | Attending: Neurosurgery

## 2018-08-06 ENCOUNTER — Ambulatory Visit (HOSPITAL_COMMUNITY)
Admission: RE | Admit: 2018-08-06 | Discharge: 2018-08-07 | Disposition: A | Discharge status: Home / Self Care | Payer: Medicare Other | Source: Ambulatory Visit | Attending: Neurosurgery | Admitting: Neurosurgery

## 2018-08-06 ENCOUNTER — Encounter (HOSPITAL_COMMUNITY): Payer: Self-pay | Admitting: Certified Registered Nurse Anesthetist

## 2018-08-06 DIAGNOSIS — C7989 Secondary malignant neoplasm of other specified sites: Secondary | ICD-10-CM | POA: Diagnosis not present

## 2018-08-06 DIAGNOSIS — Z981 Arthrodesis status: Secondary | ICD-10-CM | POA: Diagnosis not present

## 2018-08-06 DIAGNOSIS — Z79899 Other long term (current) drug therapy: Secondary | ICD-10-CM | POA: Diagnosis not present

## 2018-08-06 DIAGNOSIS — K219 Gastro-esophageal reflux disease without esophagitis: Secondary | ICD-10-CM | POA: Diagnosis not present

## 2018-08-06 DIAGNOSIS — Z888 Allergy status to other drugs, medicaments and biological substances status: Secondary | ICD-10-CM | POA: Diagnosis not present

## 2018-08-06 DIAGNOSIS — M545 Low back pain: Secondary | ICD-10-CM | POA: Diagnosis not present

## 2018-08-06 DIAGNOSIS — C7949 Secondary malignant neoplasm of other parts of nervous system: Secondary | ICD-10-CM | POA: Diagnosis present

## 2018-08-06 DIAGNOSIS — Z96642 Presence of left artificial hip joint: Secondary | ICD-10-CM | POA: Insufficient documentation

## 2018-08-06 DIAGNOSIS — M5416 Radiculopathy, lumbar region: Secondary | ICD-10-CM | POA: Insufficient documentation

## 2018-08-06 DIAGNOSIS — M19011 Primary osteoarthritis, right shoulder: Secondary | ICD-10-CM | POA: Diagnosis not present

## 2018-08-06 DIAGNOSIS — Z419 Encounter for procedure for purposes other than remedying health state, unspecified: Secondary | ICD-10-CM

## 2018-08-06 DIAGNOSIS — C7951 Secondary malignant neoplasm of bone: Secondary | ICD-10-CM | POA: Diagnosis not present

## 2018-08-06 DIAGNOSIS — M1990 Unspecified osteoarthritis, unspecified site: Secondary | ICD-10-CM | POA: Diagnosis not present

## 2018-08-06 DIAGNOSIS — Z87891 Personal history of nicotine dependence: Secondary | ICD-10-CM | POA: Diagnosis not present

## 2018-08-06 DIAGNOSIS — C61 Malignant neoplasm of prostate: Secondary | ICD-10-CM | POA: Diagnosis not present

## 2018-08-06 DIAGNOSIS — E785 Hyperlipidemia, unspecified: Secondary | ICD-10-CM | POA: Insufficient documentation

## 2018-08-06 HISTORY — PX: LUMBAR LAMINECTOMY/DECOMPRESSION MICRODISCECTOMY: SHX5026

## 2018-08-06 LAB — BASIC METABOLIC PANEL
Anion gap: 7 (ref 5–15)
BUN: 13 mg/dL (ref 8–23)
CO2: 25 mmol/L (ref 22–32)
Calcium: 8.7 mg/dL — ABNORMAL LOW (ref 8.9–10.3)
Chloride: 108 mmol/L (ref 98–111)
Creatinine, Ser: 0.71 mg/dL (ref 0.61–1.24)
GFR calc Af Amer: >60 mL/min (ref >60)
Glucose, Bld: 100 mg/dL — ABNORMAL HIGH (ref 70–99)
Potassium: 3.8 mmol/L (ref 3.5–5.1)
SODIUM: 140 mmol/L (ref 135–145)

## 2018-08-06 LAB — CBC
HCT: 41.9 % (ref 39.0–52.0)
Hemoglobin: 13.5 g/dL (ref 13.0–17.0)
MCH: 30.9 pg (ref 26.0–34.0)
MCHC: 32.2 g/dL (ref 30.0–36.0)
MCV: 95.9 fL (ref 78.0–100.0)
PLATELETS: 207 10*3/uL (ref 150–400)
RBC: 4.37 MIL/uL (ref 4.22–5.81)
RDW: 14.5 % (ref 11.5–15.5)
WBC: 7.4 10*3/uL (ref 4.0–10.5)

## 2018-08-06 SURGERY — LUMBAR LAMINECTOMY/DECOMPRESSION MICRODISCECTOMY 1 LEVEL
Anesthesia: General | Site: Spine Lumbar | Laterality: Left

## 2018-08-06 MED ORDER — THROMBIN 5000 UNITS EX SOLR
CUTANEOUS | Status: DC | PRN
  Administered 2018-08-06 (×2): 5000 [IU] via TOPICAL

## 2018-08-06 MED ORDER — EZETIMIBE 10 MG PO TABS
10.0000 mg | ORAL_TABLET | Freq: Every day | ORAL | Status: DC
  Administered 2018-08-06 – 2018-08-07 (×2): 10 mg via ORAL
  Filled 2018-08-06 (×2): qty 1

## 2018-08-06 MED ORDER — SODIUM CHLORIDE 0.9% FLUSH: 3.0000 mL | Freq: Two times a day (BID) | INTRAVENOUS | Status: DC

## 2018-08-06 MED ORDER — SUGAMMADEX SODIUM 200 MG/2ML IV SOLN
INTRAVENOUS | Status: DC | PRN
  Administered 2018-08-06: 200 mg via INTRAVENOUS

## 2018-08-06 MED ORDER — CYCLOBENZAPRINE HCL 10 MG PO TABS
10.0000 mg | ORAL_TABLET | Freq: Three times a day (TID) | ORAL | Status: DC | PRN
  Administered 2018-08-06: 10 mg via ORAL

## 2018-08-06 MED ORDER — ONDANSETRON HCL 4 MG/2ML IJ SOLN
INTRAMUSCULAR | Status: DC | PRN
  Administered 2018-08-06: 4 mg via INTRAVENOUS

## 2018-08-06 MED ORDER — PANTOPRAZOLE SODIUM 40 MG PO TBEC: 40.0000 mg | DELAYED_RELEASE_TABLET | Freq: Every day | ORAL | Status: DC

## 2018-08-06 MED ORDER — ONDANSETRON HCL 4 MG/2ML IJ SOLN
INTRAMUSCULAR | Status: AC
  Filled 2018-08-06: qty 2

## 2018-08-06 MED ORDER — FENTANYL CITRATE (PF) 100 MCG/2ML IJ SOLN
INTRAMUSCULAR | Status: AC
  Filled 2018-08-06: qty 2

## 2018-08-06 MED ORDER — THROMBIN 5000 UNITS EX SOLR
CUTANEOUS | Status: AC
  Filled 2018-08-06: qty 10000

## 2018-08-06 MED ORDER — FENTANYL CITRATE (PF) 100 MCG/2ML IJ SOLN
INTRAMUSCULAR | Status: DC | PRN
  Administered 2018-08-06: 100 ug via INTRAVENOUS
  Administered 2018-08-06 (×2): 25 ug via INTRAVENOUS
  Administered 2018-08-06: 50 ug via INTRAVENOUS
  Administered 2018-08-06: 25 ug via INTRAVENOUS
  Administered 2018-08-06: 50 ug via INTRAVENOUS
  Administered 2018-08-06: 25 ug via INTRAVENOUS

## 2018-08-06 MED ORDER — BUPIVACAINE HCL (PF) 0.25 % IJ SOLN
INTRAMUSCULAR | Status: AC
  Filled 2018-08-06: qty 30

## 2018-08-06 MED ORDER — PHENOL 1.4 % MT LIQD: 1.0000 | OROMUCOSAL | Status: DC | PRN

## 2018-08-06 MED ORDER — LIDOCAINE-EPINEPHRINE 1 %-1:100000 IJ SOLN
INTRAMUSCULAR | Status: AC
  Filled 2018-08-06: qty 1

## 2018-08-06 MED ORDER — TAMSULOSIN HCL 0.4 MG PO CAPS
0.4000 mg | ORAL_CAPSULE | Freq: Every day | ORAL | Status: DC
  Administered 2018-08-07: 0.4 mg via ORAL
  Filled 2018-08-06: qty 1

## 2018-08-06 MED ORDER — ALUM & MAG HYDROXIDE-SIMETH 200-200-20 MG/5ML PO SUSP: 30.0000 mL | Freq: Four times a day (QID) | ORAL | Status: DC | PRN

## 2018-08-06 MED ORDER — ESMOLOL HCL 100 MG/10ML IV SOLN
INTRAVENOUS | Status: AC
  Filled 2018-08-06: qty 10

## 2018-08-06 MED ORDER — ARTIFICIAL TEARS OPHTHALMIC OINT
TOPICAL_OINTMENT | OPHTHALMIC | Status: DC | PRN
  Administered 2018-08-06: 1 via OPHTHALMIC

## 2018-08-06 MED ORDER — ROCURONIUM BROMIDE 50 MG/5ML IV SOSY
PREFILLED_SYRINGE | INTRAVENOUS | Status: AC
  Filled 2018-08-06: qty 5

## 2018-08-06 MED ORDER — CYCLOSPORINE 0.05 % OP EMUL
1.0000 [drp] | Freq: Two times a day (BID) | OPHTHALMIC | Status: DC
  Administered 2018-08-06: 1 [drp] via OPHTHALMIC
  Filled 2018-08-06 (×2): qty 1

## 2018-08-06 MED ORDER — FENTANYL CITRATE (PF) 250 MCG/5ML IJ SOLN
INTRAMUSCULAR | Status: AC
  Filled 2018-08-06: qty 5

## 2018-08-06 MED ORDER — DOCUSATE SODIUM 100 MG PO CAPS
100.0000 mg | ORAL_CAPSULE | Freq: Two times a day (BID) | ORAL | Status: DC
  Administered 2018-08-06 – 2018-08-07 (×2): 100 mg via ORAL
  Filled 2018-08-06 (×2): qty 1

## 2018-08-06 MED ORDER — HEMOSTATIC AGENTS (NO CHARGE) OPTIME
TOPICAL | Status: DC | PRN
  Administered 2018-08-06: 1 via TOPICAL

## 2018-08-06 MED ORDER — CEFAZOLIN SODIUM-DEXTROSE 2-4 GM/100ML-% IV SOLN
2.0000 g | Freq: Three times a day (TID) | INTRAVENOUS | Status: AC
  Administered 2018-08-06 – 2018-08-07 (×2): 2 g via INTRAVENOUS
  Filled 2018-08-06 (×2): qty 100

## 2018-08-06 MED ORDER — ARTIFICIAL TEARS OPHTHALMIC OINT
TOPICAL_OINTMENT | OPHTHALMIC | Status: AC
  Filled 2018-08-06: qty 3.5

## 2018-08-06 MED ORDER — FENTANYL CITRATE (PF) 100 MCG/2ML IJ SOLN
25.0000 ug | INTRAMUSCULAR | Status: DC | PRN
  Administered 2018-08-06 (×2): 50 ug via INTRAVENOUS

## 2018-08-06 MED ORDER — PANTOPRAZOLE SODIUM 40 MG PO TBEC
40.0000 mg | DELAYED_RELEASE_TABLET | Freq: Every day | ORAL | Status: DC
  Administered 2018-08-06: 40 mg via ORAL
  Filled 2018-08-06: qty 1

## 2018-08-06 MED ORDER — ONDANSETRON HCL 4 MG PO TABS: 4.0000 mg | ORAL_TABLET | Freq: Four times a day (QID) | ORAL | Status: DC | PRN

## 2018-08-06 MED ORDER — SODIUM CHLORIDE 0.9 % IV SOLN
INTRAVENOUS | Status: DC | PRN
  Administered 2018-08-06: 20 ug/min via INTRAVENOUS

## 2018-08-06 MED ORDER — SODIUM CHLORIDE 0.9 % IV SOLN
INTRAVENOUS | Status: DC | PRN
  Administered 2018-08-06: 500 mL

## 2018-08-06 MED ORDER — ONDANSETRON HCL 4 MG/2ML IJ SOLN: 4.0000 mg | Freq: Four times a day (QID) | INTRAMUSCULAR | Status: DC | PRN

## 2018-08-06 MED ORDER — ESMOLOL HCL 100 MG/10ML IV SOLN
INTRAVENOUS | Status: DC | PRN
  Administered 2018-08-06: 30 mg via INTRAVENOUS

## 2018-08-06 MED ORDER — PROPOFOL 10 MG/ML IV BOLUS
INTRAVENOUS | Status: AC
  Filled 2018-08-06: qty 20

## 2018-08-06 MED ORDER — 0.9 % SODIUM CHLORIDE (POUR BTL) OPTIME
TOPICAL | Status: DC | PRN
  Administered 2018-08-06: 1000 mL

## 2018-08-06 MED ORDER — CYCLOBENZAPRINE HCL 10 MG PO TABS
ORAL_TABLET | ORAL | Status: AC
  Filled 2018-08-06: qty 1

## 2018-08-06 MED ORDER — SODIUM CHLORIDE 0.9% FLUSH: 3.0000 mL | INTRAVENOUS | Status: DC | PRN

## 2018-08-06 MED ORDER — LACTATED RINGERS IV SOLN
INTRAVENOUS | Status: DC
  Administered 2018-08-06 (×2): via INTRAVENOUS

## 2018-08-06 MED ORDER — LIDOCAINE 2% (20 MG/ML) 5 ML SYRINGE
INTRAMUSCULAR | Status: AC
  Filled 2018-08-06: qty 5

## 2018-08-06 MED ORDER — HYDROMORPHONE HCL 1 MG/ML IJ SOLN: 1.0000 mg | INTRAMUSCULAR | Status: DC | PRN

## 2018-08-06 MED ORDER — OXYCODONE-ACETAMINOPHEN 5-325 MG PO TABS: 1.0000 | ORAL_TABLET | Freq: Four times a day (QID) | ORAL | Status: DC | PRN

## 2018-08-06 MED ORDER — ACETAMINOPHEN 325 MG PO TABS: 650.0000 mg | ORAL_TABLET | ORAL | Status: DC | PRN

## 2018-08-06 MED ORDER — SODIUM CHLORIDE 0.9 % IV SOLN: 250.0000 mL | INTRAVENOUS | Status: DC

## 2018-08-06 MED ORDER — CEFAZOLIN SODIUM-DEXTROSE 2-3 GM-%(50ML) IV SOLR
INTRAVENOUS | Status: DC | PRN
  Administered 2018-08-06: 2 g via INTRAVENOUS

## 2018-08-06 MED ORDER — CALCIUM CARBONATE-VITAMIN D 500-200 MG-UNIT PO TABS
2.0000 | ORAL_TABLET | Freq: Every day | ORAL | Status: DC
  Administered 2018-08-07: 2 via ORAL
  Filled 2018-08-06 (×3): qty 2

## 2018-08-06 MED ORDER — ONDANSETRON HCL 4 MG PO TABS: 4.0000 mg | ORAL_TABLET | Freq: Three times a day (TID) | ORAL | Status: DC | PRN

## 2018-08-06 MED ORDER — LIDOCAINE-EPINEPHRINE 1 %-1:100000 IJ SOLN
INTRAMUSCULAR | Status: DC | PRN
  Administered 2018-08-06: 10 mL

## 2018-08-06 MED ORDER — OXYCODONE HCL 5 MG PO TABS
ORAL_TABLET | ORAL | Status: AC
  Filled 2018-08-06: qty 2

## 2018-08-06 MED ORDER — MENTHOL 3 MG MT LOZG: 1.0000 | LOZENGE | OROMUCOSAL | Status: DC | PRN

## 2018-08-06 MED ORDER — DEXAMETHASONE SODIUM PHOSPHATE 10 MG/ML IJ SOLN
INTRAMUSCULAR | Status: DC | PRN
  Administered 2018-08-06: 10 mg via INTRAVENOUS

## 2018-08-06 MED ORDER — TRIAMCINOLONE ACETONIDE 0.1 % EX CREA
1.0000 "application " | TOPICAL_CREAM | Freq: Every day | CUTANEOUS | Status: DC | PRN
  Filled 2018-08-06: qty 15

## 2018-08-06 MED ORDER — PROPOFOL 10 MG/ML IV BOLUS
INTRAVENOUS | Status: DC | PRN
  Administered 2018-08-06: 150 mg via INTRAVENOUS

## 2018-08-06 MED ORDER — OXYCODONE HCL 5 MG PO TABS
10.0000 mg | ORAL_TABLET | ORAL | Status: DC | PRN
  Administered 2018-08-06 – 2018-08-07 (×5): 10 mg via ORAL
  Filled 2018-08-06 (×4): qty 2

## 2018-08-06 MED ORDER — PREDNISONE 5 MG PO TABS
5.0000 mg | ORAL_TABLET | Freq: Two times a day (BID) | ORAL | Status: DC
  Administered 2018-08-06 – 2018-08-07 (×2): 5 mg via ORAL
  Filled 2018-08-06 (×3): qty 1

## 2018-08-06 MED ORDER — ROCURONIUM BROMIDE 50 MG/5ML IV SOSY
PREFILLED_SYRINGE | INTRAVENOUS | Status: DC | PRN
  Administered 2018-08-06: 50 mg via INTRAVENOUS
  Administered 2018-08-06 (×2): 10 mg via INTRAVENOUS

## 2018-08-06 MED ORDER — LIDOCAINE 2% (20 MG/ML) 5 ML SYRINGE
INTRAMUSCULAR | Status: DC | PRN
  Administered 2018-08-06: 100 mg via INTRAVENOUS

## 2018-08-06 MED ORDER — ACETAMINOPHEN 650 MG RE SUPP: 650.0000 mg | RECTAL | Status: DC | PRN

## 2018-08-06 MED ORDER — EPHEDRINE 5 MG/ML INJ
INTRAVENOUS | Status: AC
  Filled 2018-08-06: qty 10

## 2018-08-06 SURGICAL SUPPLY — 62 items
ADH SKN CLS APL DERMABOND .7 (GAUZE/BANDAGES/DRESSINGS) ×1
APL SKNCLS STERI-STRIP NONHPOA (GAUZE/BANDAGES/DRESSINGS) ×1
BAG DECANTER FOR FLEXI CONT (MISCELLANEOUS) ×3 IMPLANT
BENZOIN TINCTURE PRP APPL 2/3 (GAUZE/BANDAGES/DRESSINGS) ×3 IMPLANT
BLADE CLIPPER SURG (BLADE) IMPLANT
BLADE SURG 11 STRL SS (BLADE) ×3 IMPLANT
BUR CUTTER 7.0 ROUND (BURR) ×3 IMPLANT
BUR MATCHSTICK NEURO 3.0 LAGG (BURR) ×3 IMPLANT
CANISTER SUCT 3000ML PPV (MISCELLANEOUS) ×3 IMPLANT
CARTRIDGE OIL MAESTRO DRILL (MISCELLANEOUS) ×1 IMPLANT
CLOSURE WOUND 1/2 X4 (GAUZE/BANDAGES/DRESSINGS) ×1
CONT SPEC STER OR (MISCELLANEOUS) ×4 IMPLANT
DECANTER SPIKE VIAL GLASS SM (MISCELLANEOUS) ×3 IMPLANT
DERMABOND ADVANCED (GAUZE/BANDAGES/DRESSINGS) ×2
DERMABOND ADVANCED .7 DNX12 (GAUZE/BANDAGES/DRESSINGS) ×1 IMPLANT
DIFFUSER DRILL AIR PNEUMATIC (MISCELLANEOUS) ×3 IMPLANT
DRAPE HALF SHEET 40X57 (DRAPES) IMPLANT
DRAPE LAPAROTOMY 100X72X124 (DRAPES) ×3 IMPLANT
DRAPE MICROSCOPE LEICA (MISCELLANEOUS) ×1 IMPLANT
DRAPE SURG 17X23 STRL (DRAPES) ×3 IMPLANT
DRSG OPSITE POSTOP 4X6 (GAUZE/BANDAGES/DRESSINGS) ×2 IMPLANT
DURAPREP 26ML APPLICATOR (WOUND CARE) ×3 IMPLANT
ELECT REM PT RETURN 9FT ADLT (ELECTROSURGICAL) ×3
ELECTRODE REM PT RTRN 9FT ADLT (ELECTROSURGICAL) ×1 IMPLANT
EVACUATOR 1/8 PVC DRAIN (DRAIN) ×2 IMPLANT
GAUZE 4X4 16PLY RFD (DISPOSABLE) IMPLANT
GAUZE SPONGE 4X4 12PLY STRL (GAUZE/BANDAGES/DRESSINGS) ×1 IMPLANT
GLOVE BIO SURGEON STRL SZ7 (GLOVE) ×2 IMPLANT
GLOVE BIO SURGEON STRL SZ8 (GLOVE) ×3 IMPLANT
GLOVE BIOGEL PI IND STRL 6.5 (GLOVE) IMPLANT
GLOVE BIOGEL PI IND STRL 7.0 (GLOVE) IMPLANT
GLOVE BIOGEL PI INDICATOR 6.5 (GLOVE) ×2
GLOVE BIOGEL PI INDICATOR 7.0 (GLOVE)
GLOVE EXAM NITRILE LRG STRL (GLOVE) IMPLANT
GLOVE EXAM NITRILE XL STR (GLOVE) IMPLANT
GLOVE EXAM NITRILE XS STR PU (GLOVE) IMPLANT
GLOVE INDICATOR 8.5 STRL (GLOVE) ×3 IMPLANT
GLOVE SURG SS PI 6.5 STRL IVOR (GLOVE) ×8 IMPLANT
GOWN STRL REUS W/ TWL LRG LVL3 (GOWN DISPOSABLE) ×1 IMPLANT
GOWN STRL REUS W/ TWL XL LVL3 (GOWN DISPOSABLE) ×2 IMPLANT
GOWN STRL REUS W/TWL 2XL LVL3 (GOWN DISPOSABLE) IMPLANT
GOWN STRL REUS W/TWL LRG LVL3 (GOWN DISPOSABLE) ×3
GOWN STRL REUS W/TWL XL LVL3 (GOWN DISPOSABLE) ×3
KIT BASIN OR (CUSTOM PROCEDURE TRAY) ×3 IMPLANT
KIT TURNOVER KIT B (KITS) ×3 IMPLANT
NDL SPNL 22GX3.5 QUINCKE BK (NEEDLE) ×1 IMPLANT
NEEDLE HYPO 22GX1.5 SAFETY (NEEDLE) ×3 IMPLANT
NEEDLE SPNL 22GX3.5 QUINCKE BK (NEEDLE) ×3 IMPLANT
NS IRRIG 1000ML POUR BTL (IV SOLUTION) ×3 IMPLANT
OIL CARTRIDGE MAESTRO DRILL (MISCELLANEOUS) ×3
PACK LAMINECTOMY NEURO (CUSTOM PROCEDURE TRAY) ×3 IMPLANT
RUBBERBAND STERILE (MISCELLANEOUS) ×2 IMPLANT
SPONGE SURGIFOAM ABS GEL 12-7 (HEMOSTASIS) IMPLANT
SPONGE SURGIFOAM ABS GEL SZ50 (HEMOSTASIS) ×3 IMPLANT
STRIP CLOSURE SKIN 1/2X4 (GAUZE/BANDAGES/DRESSINGS) ×2 IMPLANT
SUT VIC AB 0 CT1 18XCR BRD8 (SUTURE) ×1 IMPLANT
SUT VIC AB 0 CT1 8-18 (SUTURE) ×3
SUT VIC AB 2-0 CT1 18 (SUTURE) ×3 IMPLANT
SUT VICRYL 4-0 PS2 18IN ABS (SUTURE) ×3 IMPLANT
TOWEL GREEN STERILE (TOWEL DISPOSABLE) ×3 IMPLANT
TOWEL GREEN STERILE FF (TOWEL DISPOSABLE) ×3 IMPLANT
WATER STERILE IRR 1000ML POUR (IV SOLUTION) ×3 IMPLANT

## 2018-08-06 NOTE — Op Note (Signed)
Preoperative diagnosis: Metastatic prostate cancer to L3 with epidural extension with left-sided L3-L4 radiculopathies  Postoperative diagnosis: Same  Procedure: Decompressive laminectomy on the left at L3 with complete removal of the left L3 lamina with microscopic foraminotomies of the L3 and L4 nerve roots and microscopic removal of epidural tumor the left.  Surgeon: Kary Kos  Anesthesia: General  EBL: Minimal  HPI: 77 year old gentleman with known metastatic prostate cancer and involvement of the L3 vertebral body with epidural extension.  Patient had primarily left L3-L4 radicular symptoms so recommended decompression with foraminotomies on the left side alone.  I extensively went over the risks and benefits of the procedure with the patient and family as well as perioperative course expectations of outcome and alternatives to surgery and they understood and agreed to proceed forward.  Operative procedure: Patient brought into the ER was induced in general anesthesia positioned prone on the Wilson frame his back was prepped and draped in routine sterile fashion.  Preoperative x-ray localized the appropriate level so after infiltration of 10 cc lidocaine with epi midline incision was made and Bovie left cautery was used to take down the subcutaneous tissue and subperiosteal dissection was carried out on the lamina of L3 and L4 on the left.  Intraoperative x-ray confirmed medication appropriate level so high-speed drill was used to drill down the medial facet complex and most of the lamina of L3.  A complete laminectomy of 3 was performed up to the inferior aspect of the lamina of 2 and I performed a small amount of superior aspect the lamina L4.  Identified the L3 and L4 pedicles.  Under MAC scopic illumination underbite and the medial facet complex allowed identification of the epidural tumor primarily centered around the medial aspect of the 3 pedicle as well as just above the 4 pedicle.  This  was all teased with a nerve hook and several specimens were taken of the epidural mass as well as all the bone and ligament was also sent in a separate specimen container.  At the end decompression was no further stenosis of the L3 and L4 foramen are widely patent.  It was then copiously irrigated because his hemostasis was maintained medium Hemovac drain was placed and the wound was closed in layers with inverted Vicryl in a running 4 subcuticular Dermabond benzoin Steri-Strips and a sterile dressing was applied patient recovery in stable condition.  At the end the case on the account sponge counts were correct.

## 2018-08-06 NOTE — Anesthesia Preprocedure Evaluation (Signed)
Anesthesia Evaluation  Patient identified by MRN, date of birth, ID band Patient awake    Reviewed: Allergy & Precautions, NPO status , Patient's Chart, lab work & pertinent test results  History of Anesthesia Complications (+) PROLONGED EMERGENCE  Airway Mallampati: II  TM Distance: >3 FB     Dental   Pulmonary former smoker,    breath sounds clear to auscultation       Cardiovascular negative cardio ROS   Rhythm:Regular Rate:Normal     Neuro/Psych    GI/Hepatic Neg liver ROS, GERD  ,  Endo/Other  negative endocrine ROS  Renal/GU negative Renal ROS     Musculoskeletal  (+) Arthritis ,   Abdominal   Peds  Hematology   Anesthesia Other Findings   Reproductive/Obstetrics                             Anesthesia Physical Anesthesia Plan  ASA: III  Anesthesia Plan: General   Post-op Pain Management:    Induction: Intravenous  PONV Risk Score and Plan: 2 and Ondansetron, Dexamethasone, Midazolam and Treatment may vary due to age or medical condition  Airway Management Planned: Oral ETT  Additional Equipment:   Intra-op Plan:   Post-operative Plan: Possible Post-op intubation/ventilation  Informed Consent: I have reviewed the patients History and Physical, chart, labs and discussed the procedure including the risks, benefits and alternatives for the proposed anesthesia with the patient or authorized representative who has indicated his/her understanding and acceptance.   Dental advisory given  Plan Discussed with: CRNA and Anesthesiologist  Anesthesia Plan Comments:         Anesthesia Quick Evaluation

## 2018-08-06 NOTE — Anesthesia Procedure Notes (Signed)
Procedure Name: Intubation Date/Time: 08/06/2018 1:28 PM Performed by: Candis Shine, CRNA Pre-anesthesia Checklist: Patient identified, Emergency Drugs available, Suction available and Patient being monitored Patient Re-evaluated:Patient Re-evaluated prior to induction Oxygen Delivery Method: Circle System Utilized Preoxygenation: Pre-oxygenation with 100% oxygen Induction Type: IV induction Ventilation: Mask ventilation without difficulty and Oral airway inserted - appropriate to patient size Laryngoscope Size: Mac and 4 Grade View: Grade I Tube type: Oral Tube size: 7.5 mm Number of attempts: 1 Airway Equipment and Method: Stylet and Oral airway Placement Confirmation: ETT inserted through vocal cords under direct vision,  positive ETCO2 and breath sounds checked- equal and bilateral Secured at: 23 cm Tube secured with: Tape Dental Injury: Teeth and Oropharynx as per pre-operative assessment

## 2018-08-06 NOTE — Transfer of Care (Signed)
Immediate Anesthesia Transfer of Care Note  Patient: [name]  Procedure(s) Performed: Laminectomy and Foraminotomy - Lumbar Three-Four- left (Left Spine Lumbar)  Patient Location: PACU  Anesthesia Type:General  Level of Consciousness: awake, alert  and oriented  Airway & Oxygen Therapy: Patient Spontanous Breathing and Patient connected to nasal cannula oxygen  Post-op Assessment: Report given to RN and Post -op Vital signs reviewed and stable  Post vital signs: Reviewed and stable  Last Vitals:  Vitals Value Taken Time  BP 135/69 08/06/2018  3:23 PM  Temp 36.4 C 08/06/2018  3:23 PM  Pulse 81 08/06/2018  3:27 PM  Resp 15 08/06/2018  3:27 PM  SpO2 98 % 08/06/2018  3:27 PM  Vitals shown include unvalidated device data.  Last Pain:  Vitals:   08/06/18 1210  TempSrc:   PainSc: 0-No pain      Patients Stated Pain Goal: 0 (44/92/01 0071)  Complications: No apparent anesthesia complications

## 2018-08-06 NOTE — H&P (Signed)
[name] is an 77 y.o. male.   Chief Complaint: Back and left leg pain HPI: 77 year old gentleman with a history of prostate cancer with involvement of the L3 vertebral body.  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  He has primarily left L3 and L4 radiculopathy with minimal back pain.  No evidence of instability.  Because of this is progression of clinical syndrome failed conservative treatment I recommended decompressive laminotomy on the left with decompression of the left L3 and L4 nerve roots.  I extensively went over the risks and benefits of the operation with him as well as perioperative course expectations of outcome and alternatives to surgery and he understands and agrees to proceed forward.  Past Medical History:  Diagnosis Date  . Arthritis   . Barrett's esophagus   . Cancer [facility])    prostate with mets  . GERD (gastroesophageal reflux disease)    takes Protonix daily  . History of colon polyps   . Hyperlipidemia    takes Zetia and Welchol daily  . Joint pain   . Muscle spasm    takes Robaxin daily as needed    Past Surgical History:  Procedure Laterality Date  . COLONOSCOPY    . ESOPHAGOGASTRODUODENOSCOPY    . KNEE ARTHROSCOPY Right 2011  . 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 TOTAL HIP ARTHROPLASTY Right   . TOTAL HIP REVISION Right 02/02/2015   Procedure: TOTAL HIP REVISION;  Surgeon: Kerin Salen, MD;  Location: [facility];  Service: Orthopedics;  Laterality: Right;    History reviewed. No pertinent family history. Social History:  reports that he has quit smoking. He has quit using smokeless tobacco. He reports that he does not drink alcohol or use drugs.  Allergies:  Allergies  Allergen Reactions  . Statins Other (See Comments)    Muscle pain    Medications  Prior to Admission  Medication Sig Dispense Refill  . Calcium Carb-Cholecalciferol (CALCIUM 1000 + D PO) Take 30 mLs by mouth daily. Calcium 1000 mg / Vit D 1000 iu    . cycloSPORINE (RESTASIS) 0.05 % ophthalmic emulsion Place 1 drop into both eyes 2 (two) times daily.    Marland Kitchen docusate sodium (COLACE) 100 MG capsule Take 100 mg by mouth 2 (two) times daily.    Marland Kitchen ezetimibe (ZETIA) 10 MG tablet Take 10 mg by mouth every morning.     Marland Kitchen leuprolide (LUPRON) 30 MG injection Inject 45 mg into the muscle every 6 (six) months.    . ondansetron (ZOFRAN) 4 MG tablet Take 4 mg by mouth every 8 (eight) hours as needed for nausea or vomiting.    Marland Kitchen oxyCODONE-acetaminophen (PERCOCET/ROXICET) 5-325 MG tablet Take 1 tablet by mouth every 6 (six) hours as needed for severe pain. 45 tablet 0  . pantoprazole (PROTONIX) 40 MG tablet Take 40 mg by mouth daily after supper.     . predniSONE (DELTASONE) 5 MG tablet Take 1 tablet (5 mg total) by mouth 2 (two) times daily with a meal. 60 tablet 2  . tamsulosin (FLOMAX) 0.4 MG CAPS capsule Take 0.4 mg by mouth daily.     Marland Kitchen triamcinolone cream (KENALOG) 0.1 % Apply 1 application topically daily as needed (dry skin in the winter).       Results for orders placed or  performed during the hospital encounter of 08/06/18 (from the past 48 hour(s))  CBC     Status: None   Collection Time: 08/06/18 12:13 PM  Result Value Ref Range   WBC 7.4 4.0 - 10.5 K/uL   RBC 4.37 4.22 - 5.81 MIL/uL   Hemoglobin 13.5 13.0 - 17.0 g/dL   HCT 41.9 39.0 - 52.0 %   MCV 95.9 78.0 - 100.0 fL   MCH 30.9 26.0 - 34.0 pg   MCHC 32.2 30.0 - 36.0 g/dL   RDW 14.5 11.5 - 15.5 %   Platelets 207 150 - 400 K/uL    Comment: Performed at [facility] [address], [place], [place] [address]   No results found.  Review of Systems  Musculoskeletal: Positive for back pain.  Neurological: Positive for sensory change.    Blood pressure (!) 158/97, pulse 99, temperature 98.2 F (36.8 C),  temperature source Oral, resp. rate 18, height 5\' 11"  (1.803 m), weight 86.2 kg, SpO2 100 %. Physical Exam  Constitutional: He is oriented to person, place, and time. He appears well-developed and well-nourished.  HENT:  Head: Normocephalic.  Neck: Normal range of motion.  Neurological: He is alert and oriented to person, place, and time. He has normal strength. GCS eye subscore is 4. GCS verbal subscore is 5. GCS motor subscore is 6.  Strength is 4 out of 5 iliopsoas and quadricep on the left otherwise 5 out of 5 both lower extremities.     Assessment/Plan 77 year old presents for a left decompressive laminectomy L3-4  Freya Zobrist P, MD 08/06/2018, 12:44 PM

## 2018-08-06 NOTE — Anesthesia Postprocedure Evaluation (Signed)
Anesthesia Post Note  Patient: [name]  Procedure(s) Performed: Laminectomy and Foraminotomy - Lumbar Three-Four- left (Left Spine Lumbar)     Patient location during evaluation: PACU Anesthesia Type: General Level of consciousness: awake Pain management: pain level controlled Vital Signs Assessment: post-procedure vital signs reviewed and stable Respiratory status: spontaneous breathing Cardiovascular status: stable Anesthetic complications: no    Last Vitals:  Vitals:   08/06/18 1544 08/06/18 1545  BP: (!) 143/93 (!) 143/93  Pulse: 80 84  Resp: 13 17  Temp:    SpO2: 97% 100%    Last Pain:  Vitals:   08/06/18 1546  TempSrc:   PainSc: 6                  Chiyeko Ferre

## 2018-08-07 ENCOUNTER — Encounter (HOSPITAL_COMMUNITY): Payer: Self-pay | Admitting: Neurosurgery

## 2018-08-07 DIAGNOSIS — K219 Gastro-esophageal reflux disease without esophagitis: Secondary | ICD-10-CM | POA: Diagnosis not present

## 2018-08-07 DIAGNOSIS — M5416 Radiculopathy, lumbar region: Secondary | ICD-10-CM | POA: Diagnosis not present

## 2018-08-07 DIAGNOSIS — C61 Malignant neoplasm of prostate: Secondary | ICD-10-CM | POA: Diagnosis not present

## 2018-08-07 DIAGNOSIS — C7951 Secondary malignant neoplasm of bone: Secondary | ICD-10-CM | POA: Diagnosis not present

## 2018-08-07 DIAGNOSIS — M545 Low back pain: Secondary | ICD-10-CM | POA: Diagnosis not present

## 2018-08-07 DIAGNOSIS — M1990 Unspecified osteoarthritis, unspecified site: Secondary | ICD-10-CM | POA: Diagnosis not present

## 2018-08-07 MED ORDER — OXYCODONE HCL 5 MG PO TABS: 5.0000 mg | ORAL_TABLET | ORAL | 0 refills | Status: DC | PRN

## 2018-08-07 MED ORDER — CYCLOBENZAPRINE HCL 10 MG PO TABS: 10.0000 mg | ORAL_TABLET | Freq: Three times a day (TID) | ORAL | 0 refills | Status: DC | PRN

## 2018-08-07 NOTE — Discharge Instructions (Signed)

## 2018-08-07 NOTE — Progress Notes (Signed)
Physical Therapy Evaluation Patient Details Name: [name] MRN: [id number] DOB: 30-Jan-1941 Today's Date: 08/07/2018   History of Present Illness  Pt is a 77 y.o. male with history of prostate cancer with incolvement of the L3 vertebral body. He is s/p L laminectomy and foraminotomy L3-4. He has a PMH significant for arthritis, Barrett's esophagus, cancer, GERD, hyperlipidemia, joint pain, muscle spasm.  Clinical Impression  Patient is s/p above surgery resulting in functional limitations due to the deficits listed below (see PT Problem List). At the time of eval pt performed ambulation and stairs with gross min guard to supervision for safety, with use of a SPC. Educated pt and wife on car transfers and positioning. Will continue to follow acutely for skilled PT to increase their independence and safety with mobility to allow discharge to the venue listed below.      Follow Up Recommendations No PT follow up    Equipment Recommendations  None recommended by PT    Recommendations for Other Services       Precautions / Restrictions Precautions Precautions: Fall;Back Precaution Booklet Issued: Yes (comment)(handout already in room) Restrictions Weight Bearing Restrictions: No      Mobility  Bed Mobility Overal bed mobility: Needs Assistance Bed Mobility: Rolling;Sidelying to Sit;Sit to Sidelying         Sit to sidelying: Supervision General bed mobility comments: Min cues for sequencing of log roll  Transfers Overall transfer level: Needs assistance Equipment used: None Transfers: Sit to/from Stand Sit to Stand: Min guard         General transfer comment: Pt min guard for safety and technique  Ambulation/Gait Ambulation/Gait assistance: Min guard;Supervision Gait Distance (Feet): 400 Feet Assistive device: Straight cane Gait Pattern/deviations: Step-through pattern;Decreased stance time - left;Decreased weight shift to left;Trunk flexed;Antalgic Gait  velocity: decreased Gait velocity interpretation: <1.31 ft/sec, indicative of household ambulator General Gait Details: Min cues for proper use of cane and upright posture; Min guard for unsteadiness initially progressing to supervision  Stairs Stairs: Yes Stairs assistance: Min guard Stair Management: One rail Right;Forwards;Backwards;Step to pattern;With cane Number of Stairs: 3 General stair comments: Min cues for safe hand placement and use of cane with stair negotiation; ascended forwards with stronger LE and descended backwards with weaker LE; increased time and effort noted  Wheelchair Mobility    Modified Rankin (Stroke Patients Only)       Balance Overall balance assessment: Needs assistance Sitting-balance support: No upper extremity supported;Feet supported Sitting balance-Leahy Scale: Fair     Standing balance support: Single extremity supported;No upper extremity supported;During functional activity Standing balance-Leahy Scale: Fair Standing balance comment: pt able to perform static stance without UE support; but requires at least one UE support for dynamic standing activites                              Pertinent Vitals/Pain Pain Assessment: Faces Faces Pain Scale: Hurts little more Pain Location: Back at incision; LLE Pain Descriptors / Indicators: Grimacing;Discomfort;Operative site guarding Pain Intervention(s): Limited activity within patient's tolerance;Monitored during session    Home Living Family/patient expects to be discharged to:: Private residence Living Arrangements: Spouse/significant other Available Help at Discharge: Family;Available 24 hours/day Type of Home: House Home Access: Stairs to enter Entrance Stairs-Rails: Right;Left;Can reach both Entrance Stairs-Number of Steps: 2 Home Layout: One level Home Equipment: Walker - 2 wheels;Cane - single point;Wheelchair - manual      Prior Function Level of Independence: Needs  assistance   Gait / Transfers Assistance Needed: ambulating with cane short distances due to pain; recently purchased a wheelchair for longer distances when experiencing increased back pain           Hand Dominance   Dominant Hand: Right    Extremity/Trunk Assessment   Upper Extremity Assessment Upper Extremity Assessment: Defer to OT evaluation    Lower Extremity Assessment Lower Extremity Assessment: Overall WFL for tasks assessed    Cervical / Trunk Assessment Cervical / Trunk Assessment: Other exceptions Cervical / Trunk Exceptions: s/p surgery  Communication   Communication: No difficulties  Cognition Arousal/Alertness: Awake/alert Behavior During Therapy: WFL for tasks assessed/performed Overall Cognitive Status: Within Functional Limits for tasks assessed                                 General Comments: Recalled 3/3 precautions      General Comments General comments (skin integrity, edema, etc.): Wife present during session    Exercises     Assessment/Plan    PT Assessment Patient needs continued PT services  PT Problem List Decreased activity tolerance;Decreased strength;Decreased range of motion;Decreased balance;Decreased mobility;Decreased coordination;Pain       PT Treatment Interventions DME instruction;Gait training;Stair training;Functional mobility training;Therapeutic activities;Therapeutic exercise;Balance training;Patient/family education    PT Goals (Current goals can be found in the Care Plan section)  Acute Rehab PT Goals Patient Stated Goal: to get home PT Goal Formulation: With patient Time For Goal Achievement: 08/14/18 Potential to Achieve Goals: Good    Frequency Min 5X/week   Barriers to discharge        Co-evaluation               AM-PAC PT "6 Clicks" Daily Activity  Outcome Measure Difficulty turning over in bed (including adjusting bedclothes, sheets and blankets)?: A Little Difficulty moving from  lying on back to sitting on the side of the bed? : A Little Difficulty sitting down on and standing up from a chair with arms (e.g., wheelchair, bedside commode, etc,.)?: A Little Help needed moving to and from a bed to chair (including a wheelchair)?: A Little Help needed walking in hospital room?: A Little Help needed climbing 3-5 steps with a railing? : A Little 6 Click Score: 18    End of Session Equipment Utilized During Treatment: Gait belt Activity Tolerance: Patient tolerated treatment well Patient left: in bed;with call bell/phone within reach Nurse Communication: Mobility status PT Visit Diagnosis: Unsteadiness on feet (R26.81);Other abnormalities of gait and mobility (R26.89);Pain Pain - Right/Left: Left(LLE) Pain - part of body: (back)    Time: 3009-2330 PT Time Calculation (min) (ACUTE ONLY): 17 min   Charges:   PT Evaluation $PT Eval Moderate Complexity: 1 Mod          Einar Crow, [place]  Student Physical Therapist Acute Rehab [phone]   Einar Crow 08/07/2018, 12:40 PM

## 2018-08-07 NOTE — Progress Notes (Signed)
Patient alert and oriented, mae's well, voiding adequate amount of urine, swallowing without difficulty, no c/o pain at time of discharge. Patient discharged home with family. Script and discharged instructions given to patient. Patient and family stated understanding of instructions given. Patient has an appointment with Dr. Cram 

## 2018-08-07 NOTE — Evaluation (Addendum)
Occupational Therapy Evaluation Patient Details Name: [name] MRN: [id number] DOB: 11-09-1941 Today's Date: 08/07/2018    History of Present Illness Pt is a 77 y.o. male with history of prostate cancer with involvement of the L3 vertebral body. He is s/p L laminectomy and foraminotomy L3-4. He has a PMH significant for arthritis, Barrett's esophagus, cancer, GERD, hyperlipidemia, joint pain, muscle spasm.   Clinical Impression   PTA, pt had assistance from wife for showering and was limited to short distance functional mobility with cane due to pain. He currently requires min guard assist for toilet transfers and supervision for LB ADL tasks. Pt and wife educated concerning back precautions related to ADL participation as well as activity modifications to maximize independence and safety in the home environment. They verbalize and demonstrate understanding. Pt's wife is able to provide the necessary level of assistance at home. No further OT needs identified. OT will sign off.     Follow Up Recommendations  Supervision/Assistance - 24 hour;No OT follow up    Equipment Recommendations  None recommended by OT    Recommendations for Other Services       Precautions / Restrictions Precautions Precautions: Fall Restrictions Weight Bearing Restrictions: No      Mobility Bed Mobility Overal bed mobility: Needs Assistance Bed Mobility: Rolling;Sidelying to Sit;Sit to Sidelying Rolling: Supervision Sidelying to sit: Supervision     Sit to sidelying: Supervision General bed mobility comments: Supervision for safety with cues for log rolling.   Transfers Overall transfer level: Needs assistance Equipment used: None Transfers: Sit to/from Stand Sit to Stand: Supervision         General transfer comment: Supervision for safety for basic sit<>stand. Min guard assist once ambulating.     Balance Overall balance assessment: Needs assistance Sitting-balance support: No  upper extremity supported;Feet supported Sitting balance-Leahy Scale: Fair     Standing balance support: Single extremity supported;No upper extremity supported;During functional activity Standing balance-Leahy Scale: Fair Standing balance comment: statically able to stand without UE support; reaching for furniture when ambulating                           ADL either performed or assessed with clinical judgement   ADL Overall ADL's : Needs assistance/impaired Eating/Feeding: Set up;Sitting   Grooming: Supervision/safety;Standing   Upper Body Bathing: Set up;Sitting   Lower Body Bathing: Supervison/ safety;Sit to/from stand   Upper Body Dressing : Set up;Sitting   Lower Body Dressing: Supervision/safety;Sit to/from stand   Toilet Transfer: Min guard;Ambulation Toilet Transfer Details (indicate cue type and reason): reaching for furniture and tray table for stability Toileting- Clothing Manipulation and Hygiene: Supervision/safety;Sit to/from stand   Tub/ Shower Transfer: Min guard;Walk-in shower;Ambulation   Functional mobility during ADLs: Min guard General ADL Comments: Pt and wife educated concerning all compensatory strategies for LB ADL as well as grooming, toileting, and bathing tasks.      Vision Baseline Vision/History: Wears glasses;Cataracts Wears Glasses: At all times Patient Visual Report: No change from baseline;Other (comment)(awaiting cataract surgery) Vision Assessment?: No apparent visual deficits     Perception     Praxis      Pertinent Vitals/Pain Pain Assessment: Faces Faces Pain Scale: Hurts little more Pain Location: Back at incision; LLE Pain Descriptors / Indicators: Aching;Operative site guarding;Sore Pain Intervention(s): Limited activity within patient's tolerance;Monitored during session;Repositioned     Hand Dominance     Extremity/Trunk Assessment Upper Extremity Assessment Upper Extremity Assessment: Overall WFL for  tasks assessed   Lower Extremity Assessment Lower Extremity Assessment: Defer to PT evaluation       Communication Communication Communication: No difficulties   Cognition Arousal/Alertness: Awake/alert Behavior During Therapy: WFL for tasks assessed/performed Overall Cognitive Status: Within Functional Limits for tasks assessed                                     General Comments  Wife present and engaged throughout session.     Exercises     Shoulder Instructions      Home Living Family/patient expects to be discharged to:: Private residence Living Arrangements: Spouse/significant other Available Help at Discharge: Family;Available 24 hours/day Type of Home: House Home Access: Stairs to enter [employer] of Steps: 2 Entrance Stairs-Rails: Right;Left;Can reach both Home Layout: One level     Bathroom Shower/Tub: Producer, television/film/video: Handicapped height Bathroom Accessibility: Yes   Home Equipment: Environmental consultant - 2 wheels;Cane - single point;Wheelchair - manual          Prior Functioning/Environment Level of Independence: Needs assistance  Gait / Transfers Assistance Needed: ambulating with cane short distances due to pain ADL's / Homemaking Assistance Needed: wife assisting with shower but otherwise independent            OT Problem List: Decreased strength;Decreased range of motion;Decreased activity tolerance;Impaired balance (sitting and/or standing);Decreased safety awareness;Decreased knowledge of use of DME or AE;Decreased knowledge of precautions;Pain      OT Treatment/Interventions:      OT Goals(Current goals can be found in the care plan section) Acute Rehab OT Goals Patient Stated Goal: to get home OT Goal Formulation: With patient/family Time For Goal Achievement: 08/21/18 Potential to Achieve Goals: Good  OT Frequency:     Barriers to D/C:            Co-evaluation              AM-PAC PT "6  Clicks" Daily Activity     Outcome Measure Help from another person eating meals?: None Help from another person taking care of personal grooming?: None Help from another person toileting, which includes using toliet, bedpan, or urinal?: A Little Help from another person bathing (including washing, rinsing, drying)?: A Little Help from another person to put on and taking off regular upper body clothing?: None Help from another person to put on and taking off regular lower body clothing?: None 6 Click Score: 22   End of Session Nurse Communication: Mobility status  Activity Tolerance: Patient tolerated treatment well Patient left: in bed;with call bell/phone within reach;Other (comment)(seated at EOB getting ready to work with PT)  OT Visit Diagnosis: Other abnormalities of gait and mobility (R26.89);Pain Pain - Right/Left: Left Pain - part of body: Leg(back)                Time: 1610-9604 OT Time Calculation (min): 22 min Charges:  OT General Charges $OT Visit: 1 Visit OT Evaluation $OT Eval Moderate Complexity: 1 Mod  Doristine Section, MS OTR/L  Pager: [phone]   Elohim Brune A Afsana Liera 08/07/2018, 9:03 AM

## 2018-08-07 NOTE — Discharge Summary (Signed)
Physician Discharge Summary  Patient ID: [name] MRN: [id number] DOB/AGE: 76-Dec-1942 76 y.o.  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 The patient tolerated the procedure well and was taken to the recovery room and then to the floor in stable condition. The hospital course was routine. There were no complications. The wound remained clean dry and intact. Pt had appropriate back soreness. No complaints of leg pain or new N/T/W. The patient remained afebrile with stable vital signs, and tolerated a regular diet. The patient continued to increase activities, and pain was well controlled with oral pain medications.   Consults: None  Significant Diagnostic Studies:  Results for orders placed or performed during the hospital encounter of 08/06/18  Basic metabolic panel  Result Value Ref Range   Sodium 140 135 - 145 mmol/L   Potassium 3.8 3.5 - 5.1 mmol/L   Chloride 108 98 - 111 mmol/L   CO2 25 22 - 32 mmol/L   Glucose, Bld 100 (H) 70 - 99 mg/dL   BUN 13 8 - 23 mg/dL   Creatinine, Ser 0.62 0.61 - 1.24 mg/dL   Calcium 8.7 (L) 8.9 - 10.3 mg/dL   GFR calc non Af Amer >60 >60 mL/min   GFR calc Af Amer >60 >60 mL/min   Anion gap 7 5 - 15  CBC  Result Value Ref Range   WBC 7.4 4.0 - 10.5 K/uL   RBC 4.37 4.22 - 5.81 MIL/uL   Hemoglobin 13.5 13.0 - 17.0 g/dL   HCT 69.4 85.4 - 62.7 %   MCV 95.9 78.0 - 100.0 fL   MCH 30.9 26.0 - 34.0 pg   MCHC 32.2 30.0 - 36.0 g/dL   RDW 03.5 00.9 - 38.1 %   Platelets 207 150 - 400 K/uL    Dg Lumbar Spine 2-3 Views  Result Date: 08/06/2018 CLINICAL DATA:  Localization image for patient  undergoing L3-4 laminotomy. EXAM: LUMBAR SPINE - 2-3 VIEW COMPARISON:  MRI lumbar spine 06/24/2018. FINDINGS: 2 lateral views of the lumbar spine are provided. On the first image, no probe is identified. On the second image, there is a clamp on the L3 spinous process and a probe at the level of the L3 pedicles. IMPRESSION: Localization as above. Electronically Signed   By: Drusilla Kanner M.D.   On: 08/06/2018 14:42   Ct Chest W Contrast  Result Date: 07/23/2018 CLINICAL DATA:  Prostate cancer with bone metastases, XRT complete, chemotherapy ongoing EXAM: CT CHEST, ABDOMEN, AND PELVIS WITH CONTRAST TECHNIQUE: Multidetector CT imaging of the chest, abdomen and pelvis was performed following the standard protocol during bolus administration of intravenous contrast. CONTRAST:  ISOVUE-300 IOPAMIDOL (ISOVUE-300) INJECTION 61% COMPARISON:  Partial comparison CT lumbar spine dated 04/02/2017. FINDINGS: CT CHEST FINDINGS Cardiovascular: Heart is normal in size.  No pericardial effusion. No evidence of thoracic aortic aneurysm. Mild atherosclerotic calcifications of the aortic root/arch. Mild coronary atherosclerosis of the LAD and right coronary artery. Mediastinum/Nodes: Small mediastinal lymph nodes which do not meet pathologic CT size criteria. No suspicious axillary lymphadenopathy. Visualized thyroid is unremarkable. Lungs/Pleura: Mild biapical pleural-parenchymal scarring. Mild pleural-parenchymal scarring in the posterior right upper and bilateral lower lobes. Mild subpleural reticulation/fibrosis in the lungs bilaterally, particularly  the anterior left upper lobe and posterior bilateral lower lobes. No suspicious pulmonary nodules. No focal consolidation. No pleural effusion or pneumothorax. Musculoskeletal: Degenerative changes of the thoracic spine. No focal osseous lesions. CT ABDOMEN PELVIS FINDINGS Hepatobiliary: Liver is within normal limits. Gallbladder is unremarkable. No intrahepatic or  extrahepatic ductal dilatation. Pancreas: Within normal limits. Spleen: Within normal limits. Adrenals/Urinary Tract: 3.4 cm left adrenal mass (series 2/image 86), previously 1.3 cm on prior lumbar spine CT, suspicious for metastasis. Right adrenal gland is within normal limits. 9.5 cm lateral left lower pole renal cyst. 3.2 cm anterior interpolar right renal cyst (series 2/image 32). No hydronephrosis. Bladder is underdistended and partially obscured by streak artifact. Stomach/Bowel: Stomach is within normal limits. No evidence of bowel obstruction. Normal appendix (series 2/image 104). Mild left colonic stool burden. Vascular/Lymphatic: No evidence of abdominal aortic aneurysm. Atherosclerotic calcifications of the abdominal aorta and branch vessels. 1.8 cm right common iliac node (series 2/image 90). 1.8 cm bilateral para-aortic nodes (series 2/image 78). Reproductive: Brachytherapy seeds in the prostate. Other: No abdominopelvic ascites. Musculoskeletal: Bilateral hip arthroplasties, without evidence of complication. Mild superior endplate compression fracture deformity at L3. Associated sclerosis in the vertebral body, suggesting pathologic fracture. Moderate superior endplate compression fracture deformity at L5. No retropulsion. These are better evaluated on prior MRI. IMPRESSION: Brachytherapy seeds in the prostate. Retroperitoneal nodal metastases, as above. Left adrenal metastasis, progressive from 2018. Mild superior endplate pathologic compression fracture at L3. Moderate superior endplate compression fracture deformity at L5. These are unchanged from prior MRI. No evidence of metastatic disease in the chest. Electronically Signed   By: Charline Bills M.D.   On: 07/23/2018 10:21   Ct Abdomen Pelvis W Contrast  Result Date: 07/23/2018 CLINICAL DATA:  Prostate cancer with bone metastases, XRT complete, chemotherapy ongoing EXAM: CT CHEST, ABDOMEN, AND PELVIS WITH CONTRAST TECHNIQUE: Multidetector CT  imaging of the chest, abdomen and pelvis was performed following the standard protocol during bolus administration of intravenous contrast. CONTRAST:  ISOVUE-300 IOPAMIDOL (ISOVUE-300) INJECTION 61% COMPARISON:  Partial comparison CT lumbar spine dated 04/02/2017. FINDINGS: CT CHEST FINDINGS Cardiovascular: Heart is normal in size.  No pericardial effusion. No evidence of thoracic aortic aneurysm. Mild atherosclerotic calcifications of the aortic root/arch. Mild coronary atherosclerosis of the LAD and right coronary artery. Mediastinum/Nodes: Small mediastinal lymph nodes which do not meet pathologic CT size criteria. No suspicious axillary lymphadenopathy. Visualized thyroid is unremarkable. Lungs/Pleura: Mild biapical pleural-parenchymal scarring. Mild pleural-parenchymal scarring in the posterior right upper and bilateral lower lobes. Mild subpleural reticulation/fibrosis in the lungs bilaterally, particularly the anterior left upper lobe and posterior bilateral lower lobes. No suspicious pulmonary nodules. No focal consolidation. No pleural effusion or pneumothorax. Musculoskeletal: Degenerative changes of the thoracic spine. No focal osseous lesions. CT ABDOMEN PELVIS FINDINGS Hepatobiliary: Liver is within normal limits. Gallbladder is unremarkable. No intrahepatic or extrahepatic ductal dilatation. Pancreas: Within normal limits. Spleen: Within normal limits. Adrenals/Urinary Tract: 3.4 cm left adrenal mass (series 2/image 86), previously 1.3 cm on prior lumbar spine CT, suspicious for metastasis. Right adrenal gland is within normal limits. 9.5 cm lateral left lower pole renal cyst. 3.2 cm anterior interpolar right renal cyst (series 2/image 32). No hydronephrosis. Bladder is underdistended and partially obscured by streak artifact. Stomach/Bowel: Stomach is within normal limits. No evidence of bowel obstruction. Normal appendix (series 2/image 104). Mild left colonic stool burden. Vascular/Lymphatic:  No evidence of abdominal aortic aneurysm. Atherosclerotic calcifications of the abdominal aorta and branch vessels. 1.8 cm right common iliac node (  series 2/image 90). 1.8 cm bilateral para-aortic nodes (series 2/image 78). Reproductive: Brachytherapy seeds in the prostate. Other: No abdominopelvic ascites. Musculoskeletal: Bilateral hip arthroplasties, without evidence of complication. Mild superior endplate compression fracture deformity at L3. Associated sclerosis in the vertebral body, suggesting pathologic fracture. Moderate superior endplate compression fracture deformity at L5. No retropulsion. These are better evaluated on prior MRI. IMPRESSION: Brachytherapy seeds in the prostate. Retroperitoneal nodal metastases, as above. Left adrenal metastasis, progressive from 2018. Mild superior endplate pathologic compression fracture at L3. Moderate superior endplate compression fracture deformity at L5. These are unchanged from prior MRI. No evidence of metastatic disease in the chest. Electronically Signed   By: Charline Bills M.D.   On: 07/23/2018 10:21   Dg Epidural/nerve Root  Result Date: 07/13/2018 CLINICAL DATA:  Lumbosacral spondylosis without myelopathy. L3 vertebral tumor. There is encroachment of the left L3 and L4 nerve roots. EXAM: EPIDURAL/NERVE ROOT FLUOROSCOPY TIME:  1 minutes and 5 seconds.  10.6 mGy. PROCEDURE: The procedure, risks, benefits, and alternatives were explained to the patient. Questions regarding the procedure were encouraged and answered. The patient understands and consents to the procedure. LEFT L3 NERVE ROOT BLOCK AND TRANSFORAMINAL EPIDURAL: A posterior oblique approach was taken to the intervertebral foramen on the left at L3-4 using a curved 22 gauge spinal needle. Injection of Omnipaque 180 outlined the left L3 nerve root and showed good epidural spread. No vascular opacification is seen. One hundred twenty mg of Depo-Medrol mixed with 0.5 cc saline were instilled. The  procedure was well-tolerated, and the patient was discharged thirty minutes following the injection in good condition. COMPLICATIONS: None IMPRESSION: Technically successful injection consisting of a left L3-4 nerve root block and transforaminal epidural. Electronically Signed   By: Jolaine Click M.D.   On: 07/13/2018 13:52    Antibiotics:  Anti-infectives (From admission, onward)   Start     Dose/Rate Route Frequency Ordered Stop   08/06/18 2130  ceFAZolin (ANCEF) IVPB 2g/100 mL premix     2 g 200 mL/hr over 30 Minutes Intravenous Every 8 hours 08/06/18 1722 08/07/18 0500   08/06/18 1416  bacitracin 50,000 Units in sodium chloride 0.9 % 500 mL irrigation  Status:  Discontinued       As needed 08/06/18 1417 08/06/18 1523      Discharge Exam: Blood pressure 126/80, pulse 66, temperature 98.4 F (36.9 C), temperature source Oral, resp. rate 16, height 5\' 11"  (1.803 m), weight 86.2 kg, SpO2 98 %. Neurologic: Grossly normal  Ambulating and voiding well  Discharge Medications:   Allergies as of 08/07/2018      Reactions   Statins Other (See Comments)   Muscle pain      Medication List    TAKE these medications   CALCIUM 1000 + D PO Take 30 mLs by mouth daily. Calcium 1000 mg / Vit D 1000 iu   cyclobenzaprine 10 MG tablet Commonly known as:  FLEXERIL Take 1 tablet (10 mg total) by mouth 3 (three) times daily as needed for muscle spasms.   cycloSPORINE 0.05 % ophthalmic emulsion Commonly known as:  RESTASIS Place 1 drop into both eyes 2 (two) times daily.   docusate sodium 100 MG capsule Commonly known as:  COLACE Take 100 mg by mouth 2 (two) times daily.   ezetimibe 10 MG tablet Commonly known as:  ZETIA Take 10 mg by mouth every morning.   leuprolide 30 MG injection Commonly known as:  LUPRON Inject 45 mg into the muscle every 6 (  six) months.   ondansetron 4 MG tablet Commonly known as:  ZOFRAN Take 4 mg by mouth every 8 (eight) hours as needed for nausea or  vomiting.   oxyCODONE 5 MG immediate release tablet Commonly known as:  Oxy IR/ROXICODONE Take 1 tablet (5 mg total) by mouth every 4 (four) hours as needed.   oxyCODONE-acetaminophen 5-325 MG tablet Commonly known as:  PERCOCET/ROXICET Take 1 tablet by mouth every 6 (six) hours as needed for severe pain.   pantoprazole 40 MG tablet Commonly known as:  PROTONIX Take 40 mg by mouth daily after supper.   predniSONE 5 MG tablet Commonly known as:  DELTASONE Take 1 tablet (5 mg total) by mouth 2 (two) times daily with a meal.   tamsulosin 0.4 MG Caps capsule Commonly known as:  FLOMAX Take 0.4 mg by mouth daily.   triamcinolone cream 0.1 % Commonly known as:  KENALOG Apply 1 application topically daily as needed (dry skin in the winter).       Disposition: home   Final Dx: decompressive lami and epidural tumor resection  Discharge Instructions     Remove dressing in 72 hours   Complete by:  As directed    Call MD for:   Complete by:  As directed    Call MD for:  difficulty breathing, headache or visual disturbances   Complete by:  As directed    Call MD for:  hives   Complete by:  As directed    Call MD for:  persistant dizziness or light-headedness   Complete by:  As directed    Call MD for:  persistant nausea and vomiting   Complete by:  As directed    Call MD for:  redness, tenderness, or signs of infection (pain, swelling, redness, odor or green/yellow discharge around incision site)   Complete by:  As directed    Call MD for:  severe uncontrolled pain   Complete by:  As directed    Call MD for:  temperature >100.4   Complete by:  As directed    Diet - low sodium heart healthy   Complete by:  As directed    Driving Restrictions   Complete by:  As directed    No dirving 2 weeks   Increase activity slowly   Complete by:  As directed    Lifting restrictions   Complete by:  As directed    No lifting more than 8 lbs         Signed: Tiana Loft  Bernardine Langworthy 08/07/2018, 8:38 AM

## 2018-08-14 ENCOUNTER — Telehealth: Payer: Self-pay | Admitting: Pharmacist

## 2018-08-14 NOTE — Telephone Encounter (Signed)
Oral Oncology Pharmacist Encounter  Received notification from patient's daughter that patient is planned to start Zytiga (abiraterone) for the treatment of metastatic, castration-resistant prostate cancer in conjunction with prednisone and androgen deprivation therapy, planned duration until disease progression or unacceptable toxicity. Patient has progressed through treatment on Xtandi (enzalutamide) and Taxotere (docetaxel)  We have not received prescription for Zytiga at this time. Anticipate it will be written at office visit scheduled for 08/23/2018 with Dr. [name]. Patient is now s/p decompressive laminectomy performed 08/06/18 Patient with radiation oncology consult appointment scheduled for 08/15/2018  Labs from [facility]) assessed, no renal or hepatic dysfunction noted.  Current medication list in Epic reviewed, no significant DDIs with Zytiga identified.  Prescription will be sent to appropriate specialty pharmacy once received by MD. [place] has already been secured through [employer].  Prednisone prescription has been sent to [facility] on [address].  I will plan to perform initial counseling and coordination of medication acquisition at [place] on 08/23/18.  Oral Oncology Clinic will continue to follow for insurance authorization, initial counseling and start date.  Johny Drilling, PharmD, BCPS, BCOP  08/14/2018 8:14 AM Oral Oncology Clinic [phone]

## 2018-08-15 ENCOUNTER — Ambulatory Visit
Admission: RE | Admit: 2018-08-15 | Discharge: 2018-08-15 | Disposition: A | Discharge status: Home / Self Care | Payer: Medicare Other | Source: Ambulatory Visit | Attending: Radiation Oncology | Admitting: Radiation Oncology

## 2018-08-15 ENCOUNTER — Ambulatory Visit: Payer: Medicare Other

## 2018-08-16 ENCOUNTER — Emergency Department (HOSPITAL_COMMUNITY): Payer: Medicare Other

## 2018-08-16 ENCOUNTER — Encounter (HOSPITAL_COMMUNITY): Payer: Self-pay | Admitting: Emergency Medicine

## 2018-08-16 ENCOUNTER — Observation Stay (HOSPITAL_COMMUNITY)
Admission: EM | Admit: 2018-08-16 | Discharge: 2018-08-17 | Disposition: A | Discharge status: Home / Self Care | Payer: Medicare Other | Attending: Internal Medicine | Admitting: Internal Medicine

## 2018-08-16 ENCOUNTER — Other Ambulatory Visit: Payer: Self-pay

## 2018-08-16 ENCOUNTER — Observation Stay (HOSPITAL_COMMUNITY): Payer: Medicare Other

## 2018-08-16 DIAGNOSIS — Z9889 Other specified postprocedural states: Secondary | ICD-10-CM

## 2018-08-16 DIAGNOSIS — Z471 Aftercare following joint replacement surgery: Secondary | ICD-10-CM | POA: Diagnosis not present

## 2018-08-16 DIAGNOSIS — C61 Malignant neoplasm of prostate: Secondary | ICD-10-CM | POA: Diagnosis not present

## 2018-08-16 DIAGNOSIS — Z96641 Presence of right artificial hip joint: Secondary | ICD-10-CM | POA: Diagnosis not present

## 2018-08-16 DIAGNOSIS — R000 Tachycardia, unspecified: Secondary | ICD-10-CM | POA: Diagnosis not present

## 2018-08-16 DIAGNOSIS — Z96642 Presence of left artificial hip joint: Secondary | ICD-10-CM | POA: Diagnosis not present

## 2018-08-16 DIAGNOSIS — R410 Disorientation, unspecified: Secondary | ICD-10-CM | POA: Diagnosis not present

## 2018-08-16 DIAGNOSIS — R4182 Altered mental status, unspecified: Secondary | ICD-10-CM | POA: Diagnosis not present

## 2018-08-16 DIAGNOSIS — E872 Acidosis: Secondary | ICD-10-CM

## 2018-08-16 DIAGNOSIS — R531 Weakness: Secondary | ICD-10-CM | POA: Diagnosis not present

## 2018-08-16 DIAGNOSIS — R443 Hallucinations, unspecified: Secondary | ICD-10-CM

## 2018-08-16 DIAGNOSIS — Z87891 Personal history of nicotine dependence: Secondary | ICD-10-CM | POA: Insufficient documentation

## 2018-08-16 DIAGNOSIS — R52 Pain, unspecified: Secondary | ICD-10-CM

## 2018-08-16 DIAGNOSIS — Z79899 Other long term (current) drug therapy: Secondary | ICD-10-CM | POA: Diagnosis not present

## 2018-08-16 DIAGNOSIS — M25552 Pain in left hip: Secondary | ICD-10-CM | POA: Diagnosis not present

## 2018-08-16 DIAGNOSIS — Z8546 Personal history of malignant neoplasm of prostate: Secondary | ICD-10-CM | POA: Insufficient documentation

## 2018-08-16 LAB — I-STAT CG4 LACTIC ACID, ED
LACTIC ACID, VENOUS: 2 mmol/L — AB (ref 0.5–1.9)
LACTIC ACID, VENOUS: 2.36 mmol/L — AB (ref 0.5–1.9)

## 2018-08-16 LAB — CBC WITH DIFFERENTIAL/PLATELET
Abs Immature Granulocytes: 0.1 10*3/uL (ref 0.0–0.1)
BASOS PCT: 1 %
Basophils Absolute: 0 10*3/uL (ref 0.0–0.1)
EOS ABS: 0.1 10*3/uL (ref 0.0–0.7)
Eosinophils Relative: 1 %
HEMATOCRIT: 38.1 % — AB (ref 39.0–52.0)
Hemoglobin: 12.5 g/dL — ABNORMAL LOW (ref 13.0–17.0)
IMMATURE GRANULOCYTES: 1 %
LYMPHS ABS: 0.8 10*3/uL (ref 0.7–4.0)
Lymphocytes Relative: 9 %
MCH: 31.4 pg (ref 26.0–34.0)
MCHC: 32.8 g/dL (ref 30.0–36.0)
MCV: 95.7 fL (ref 78.0–100.0)
MONOS PCT: 11 %
Monocytes Absolute: 1 10*3/uL (ref 0.1–1.0)
NEUTROS ABS: 6.7 10*3/uL (ref 1.7–7.7)
NEUTROS PCT: 77 %
PLATELETS: 323 10*3/uL (ref 150–400)
RBC: 3.98 MIL/uL — AB (ref 4.22–5.81)
RDW: 14.5 % (ref 11.5–15.5)
WBC: 8.7 10*3/uL (ref 4.0–10.5)

## 2018-08-16 LAB — URINALYSIS, ROUTINE W REFLEX MICROSCOPIC
Bilirubin Urine: NEGATIVE
Glucose, UA: NEGATIVE mg/dL
Ketones, ur: NEGATIVE mg/dL
Leukocytes, UA: NEGATIVE
Nitrite: NEGATIVE
PROTEIN: NEGATIVE mg/dL
SPECIFIC GRAVITY, URINE: 1.01 (ref 1.005–1.030)
pH: 6 (ref 5.0–8.0)

## 2018-08-16 LAB — COMPREHENSIVE METABOLIC PANEL
ALBUMIN: 3.1 g/dL — AB (ref 3.5–5.0)
ALT: 15 U/L (ref 0–44)
ANION GAP: 10 (ref 5–15)
AST: 19 U/L (ref 15–41)
Alkaline Phosphatase: 52 U/L (ref 38–126)
BUN: 11 mg/dL (ref 8–23)
CALCIUM: 9 mg/dL (ref 8.9–10.3)
CO2: 21 mmol/L — AB (ref 22–32)
Chloride: 104 mmol/L (ref 98–111)
Creatinine, Ser: 0.88 mg/dL (ref 0.61–1.24)
GFR calc non Af Amer: >60 mL/min (ref >60)
GLUCOSE: 98 mg/dL (ref 70–99)
POTASSIUM: 3.8 mmol/L (ref 3.5–5.1)
SODIUM: 135 mmol/L (ref 135–145)
Total Bilirubin: 0.6 mg/dL (ref 0.3–1.2)
Total Protein: 5.6 g/dL — ABNORMAL LOW (ref 6.5–8.1)

## 2018-08-16 LAB — I-STAT VENOUS BLOOD GAS, ED
Acid-base deficit: 1 mmol/L (ref 0.0–2.0)
BICARBONATE: 22.6 mmol/L (ref 20.0–28.0)
O2 Saturation: 96 %
PH VEN: 7.451 — AB (ref 7.250–7.430)
TCO2: 24 mmol/L (ref 22–32)
pCO2, Ven: 32.5 mmHg — ABNORMAL LOW (ref 44.0–60.0)
pO2, Ven: 78 mmHg — ABNORMAL HIGH (ref 32.0–45.0)

## 2018-08-16 LAB — PROTIME-INR
INR: 1.02
Prothrombin Time: 13.3 seconds (ref 11.4–15.2)

## 2018-08-16 LAB — I-STAT TROPONIN, ED: Troponin i, poc: 0.01 ng/mL (ref 0.00–0.08)

## 2018-08-16 LAB — MAGNESIUM: MAGNESIUM: 1.9 mg/dL (ref 1.7–2.4)

## 2018-08-16 LAB — LACTIC ACID, PLASMA
LACTIC ACID, VENOUS: 1.7 mmol/L (ref 0.5–1.9)
Lactic Acid, Venous: 2.4 mmol/L (ref 0.5–1.9)

## 2018-08-16 LAB — AMMONIA: AMMONIA: 31 umol/L (ref 9–35)

## 2018-08-16 LAB — TSH: TSH: 0.615 u[IU]/mL (ref 0.350–4.500)

## 2018-08-16 LAB — BRAIN NATRIURETIC PEPTIDE: B Natriuretic Peptide: 104.5 pg/mL — ABNORMAL HIGH (ref 0.0–100.0)

## 2018-08-16 LAB — PHOSPHORUS: PHOSPHORUS: 3 mg/dL (ref 2.5–4.6)

## 2018-08-16 MED ORDER — DOCUSATE SODIUM 100 MG PO CAPS
100.0000 mg | ORAL_CAPSULE | Freq: Two times a day (BID) | ORAL | Status: DC
  Administered 2018-08-16 – 2018-08-17 (×2): 100 mg via ORAL
  Filled 2018-08-16 (×2): qty 1

## 2018-08-16 MED ORDER — ENOXAPARIN SODIUM 40 MG/0.4ML ~~LOC~~ SOLN
40.0000 mg | SUBCUTANEOUS | Status: DC
  Administered 2018-08-16: 40 mg via SUBCUTANEOUS
  Filled 2018-08-16: qty 0.4

## 2018-08-16 MED ORDER — QUETIAPINE FUMARATE 25 MG PO TABS: 25.0000 mg | ORAL_TABLET | Freq: Two times a day (BID) | ORAL | Status: DC | PRN

## 2018-08-16 MED ORDER — POLYETHYLENE GLYCOL 3350 17 G PO PACK: 17.0000 g | PACK | Freq: Every day | ORAL | Status: DC | PRN

## 2018-08-16 MED ORDER — SODIUM CHLORIDE 0.9 % IV BOLUS: 1000.0000 mL | Freq: Once | INTRAVENOUS | Status: DC

## 2018-08-16 MED ORDER — QUETIAPINE FUMARATE 25 MG PO TABS
25.0000 mg | ORAL_TABLET | Freq: Every day | ORAL | Status: DC
  Administered 2018-08-16: 25 mg via ORAL
  Filled 2018-08-16: qty 1

## 2018-08-16 MED ORDER — METHYLPREDNISOLONE 32 MG PO TABS: 32.0000 mg | ORAL_TABLET | Freq: Every day | ORAL | Status: DC

## 2018-08-16 MED ORDER — PANTOPRAZOLE SODIUM 20 MG PO TBEC
20.0000 mg | DELAYED_RELEASE_TABLET | Freq: Every day | ORAL | Status: DC
  Administered 2018-08-17: 20 mg via ORAL
  Filled 2018-08-16 (×2): qty 1

## 2018-08-16 MED ORDER — LACTATED RINGERS IV SOLN
INTRAVENOUS | Status: DC
  Administered 2018-08-16 – 2018-08-17 (×2): via INTRAVENOUS

## 2018-08-16 MED ORDER — IBUPROFEN 200 MG PO TABS
200.0000 mg | ORAL_TABLET | Freq: Four times a day (QID) | ORAL | Status: DC | PRN
  Administered 2018-08-16: 200 mg via ORAL
  Filled 2018-08-16: qty 1

## 2018-08-16 MED ORDER — TRAMADOL HCL 50 MG PO TABS
50.0000 mg | ORAL_TABLET | Freq: Four times a day (QID) | ORAL | Status: DC | PRN
  Administered 2018-08-16 – 2018-08-17 (×2): 50 mg via ORAL
  Filled 2018-08-16 (×2): qty 1

## 2018-08-16 MED ORDER — SODIUM CHLORIDE 0.9 % IV BOLUS
500.0000 mL | Freq: Once | INTRAVENOUS | Status: AC
  Administered 2018-08-16: 500 mL via INTRAVENOUS

## 2018-08-16 MED ORDER — PREDNISONE 20 MG PO TABS
20.0000 mg | ORAL_TABLET | Freq: Two times a day (BID) | ORAL | Status: DC
  Administered 2018-08-17: 20 mg via ORAL
  Filled 2018-08-16: qty 1

## 2018-08-16 MED ORDER — EZETIMIBE 10 MG PO TABS
10.0000 mg | ORAL_TABLET | ORAL | Status: DC
  Administered 2018-08-17: 10 mg via ORAL
  Filled 2018-08-16: qty 1

## 2018-08-16 NOTE — ED Notes (Addendum)
Family endorses random hallucinations, not all the time, but occasionally will not make sense. Is alert/oriented x 4 at this time.  Took flexeril last night,

## 2018-08-16 NOTE — ED Notes (Signed)
Patient given lunch by daughter at bedside. Patient tolerating well.

## 2018-08-16 NOTE — ED Triage Notes (Signed)
To ED via [facility] from home with c/o weakness that started this am. Had recent surgery 8/19 on L3-also receiving chemotherapy for prostate ca- no treatment this month due to surgery.  On arrival- pt is alert/oriented x 4- w/d color pink-

## 2018-08-16 NOTE — Progress Notes (Signed)
Pt. with c/o increase pain (L) hip and Bodenheimer,NP paged.

## 2018-08-16 NOTE — H&P (Addendum)
History and Physical    DOA: 08/16/2018  PCP: [name], MD  Patient coming from: Home  Chief Complaint: Worsening left lower extremity pain and confusion  HPI: [name] is a 77 y.o. male with history h/o metastatic prostate cancer, status post radiation treatment in 2018 followed by chemotherapy which was discontinued in July 2019 due to lack of adequate response but continued on Lupron therapy with shots every 6 months was admitted recently on August 19 and discharged on 8\20\2019 status post decompressive laminectomy with epidural tumor resection by Dr. [name].  Patient was cane dependent prior to that admission.  He tolerated surgery well and was discharged home with oxycodone/Flexeril and was advised to continue chronic prednisone (he takes 5 mg twice daily ever since started on chemotherapy).  According to wife he was taking oxycodone twice a day and wasnt not really taking Flexeril initially as he was doing fairly well post discharge in terms of pain control and mental status.  Last Sunday patient reports worsening pain along the left lower extremity which was somewhat different in quality than the pain prior to surgery.  Patient states previously his leg pain seemed to be radiating from the low back pain and was less intense but continuous in nature.  This time the pain was more intense and seemed to be coming from the hip radiating to lower extremity with inability to bear weight.  He states when he is resting and not moving, he does not have any pain.  They called the doctor's office on Tuesday due to worsening symptoms and was advised to hold chronic prednisone and start taking Medrol pack.  Patient took 2 doses of Flexeril yesterday, started Medrol pack in addition to twice daily oxycodone as well as as needed Motrin.  His pain seemed to have improved somewhat but brought in by family as he was having visual hallucinations.  According to wife and daughter bedside patient had been  confused for last 3 days, not sleeping well at night and family noted auditory and visual hallucinations on and off which was worse this morning.  Currently patient is awake alert oriented x3, able to participate in the interview process, denies any acute leg pain while he is sitting in the chair.  In the ED patient noted to be tachycardic with heart rate in 100s, elevated lactate at 2, normal chest x-ray and UA and other labs within normal limits.  Hospitalist service called to admit patient.  ED physician attempting to contact primary neurosurgeon Dr. [name] for additional recommendations if any.   Per Previous rad-Oncology note: He was treated under the care of Dr. [name] at [facility] initially with enzalutamide started in May 2018 until January 2019 where he presented with progression of disease with bony metastasis, adrenal nodule as well as worsening adenopathy. At that time he was treated with Taxotere chemotherapy initially at 60 mg/m and that was reduced to 45 mg/m after cycle 6. His last Lupron was given and on January 22, 2018 under the care of Dr. [name] and has received 45 mg at that time. And he has also been receiving denosumab every 6 weeks with last treatment was on 07/18/2018.  Review of Systems: As per HPI otherwise 10 point review of systems negative.    Past Medical History:  Diagnosis Date  . Arthritis   . Barrett's esophagus   . Cancer [facility])    prostate with mets  . GERD (gastroesophageal reflux disease)    takes  Protonix daily  . History of colon polyps   . Hyperlipidemia    takes Zetia and Welchol daily  . Joint pain   . Muscle spasm    takes Robaxin daily as needed    Past Surgical History:  Procedure Laterality Date  . COLONOSCOPY    . ESOPHAGOGASTRODUODENOSCOPY    . KNEE ARTHROSCOPY Right 2011  . LUMBAR LAMINECTOMY/DECOMPRESSION MICRODISCECTOMY Left 08/06/2018   Procedure: Laminectomy and Foraminotomy - Lumbar Three-Four- left;   Surgeon: Kary Kos, MD;  Location: [place];  Service: Neurosurgery;  Laterality: Left;  left  . TOTAL HIP ARTHROPLASTY Left 05/07/2013   Procedure: LEFT TOTAL HIP ARTHROPLASTY ANTERIOR APPROACH;  Surgeon: Mcarthur Rossetti, MD;  Location: [place];  Service: Orthopedics;  Laterality: Left;  . TOTAL HIP ARTHROPLASTY Right   . TOTAL HIP REVISION Right 02/02/2015   Procedure: TOTAL HIP REVISION;  Surgeon: Kerin Salen, MD;  Location: [place];  Service: Orthopedics;  Laterality: Right;    Social history:  reports that he has quit smoking. He has quit using smokeless tobacco. He reports that he does not drink alcohol or use drugs.   Allergies  Allergen Reactions  . Statins Other (See Comments)    Muscle pain    Family history: Reviewed and noncontributory   Prior to Admission medications   Medication Sig Start Date End Date Taking? Authorizing Provider  Calcium Carb-Cholecalciferol (CALCIUM 1000 + D PO) Take 30 mLs by mouth daily. Calcium 1000 mg / Vit D 1000 iu   Yes [provider]  cyclobenzaprine (FLEXERIL) 10 MG tablet Take 1 tablet (10 mg total) by mouth 3 (three) times daily as needed for muscle spasms. 08/07/18  Yes Meyran, Ocie Cornfield, NP  cycloSPORINE (RESTASIS) 0.05 % ophthalmic emulsion Place 1 drop into both eyes 2 (two) times daily.   Yes [provider]  docusate sodium (COLACE) 100 MG capsule Take 100 mg by mouth 2 (two) times daily.   Yes [provider]  ezetimibe (ZETIA) 10 MG tablet Take 10 mg by mouth every morning.    Yes [provider]  ibuprofen (ADVIL,MOTRIN) 200 MG tablet Take 200 mg by mouth every 6 (six) hours as needed for moderate pain.   Yes [provider]  leuprolide (LUPRON) 30 MG injection Inject 45 mg into the muscle every 6 (six) months.   Yes [provider]  methylPREDNISolone (MEDROL) 4 MG tablet Take 4 mg by mouth as directed. 08/14/18  Yes [provider]  ondansetron (ZOFRAN) 4 MG  tablet Take 4 mg by mouth every 8 (eight) hours as needed for nausea or vomiting.   Yes [provider]  oxyCODONE (ROXICODONE) 5 MG immediate release tablet Take 1 tablet (5 mg total) by mouth every 4 (four) hours as needed. Patient taking differently: Take 5 mg by mouth every 4 (four) hours as needed for moderate pain.  08/07/18 08/07/19 Yes Meyran, Ocie Cornfield, NP  pantoprazole (PROTONIX) 40 MG tablet Take 40 mg by mouth daily after supper.    Yes [provider]  predniSONE (DELTASONE) 5 MG tablet Take 1 tablet (5 mg total) by mouth 2 (two) times daily with a meal. 07/31/18  Yes Shadad, Mathis Dad, MD  tamsulosin (FLOMAX) 0.4 MG CAPS capsule Take 0.4 mg by mouth daily.  08/10/16  Yes [provider]  triamcinolone cream (KENALOG) 0.1 % Apply 1 application topically daily as needed (dry skin in the winter).    Yes [provider]  oxyCODONE-acetaminophen (PERCOCET/ROXICET) 5-325  MG tablet Take 1 tablet by mouth every 6 (six) hours as needed for severe pain. Patient not taking: Reported on 08/16/2018 07/16/18   Meredith Pel, MD    Physical Exam: Vitals:   08/16/18 0901 08/16/18 0906 08/16/18 0907 08/16/18 1316  BP:  117/82    Pulse:  100  100  Resp:  20  (!) 22  Temp:  98.7 F (37.1 C)  99.5 F (37.5 C)  TempSrc:  Oral  Oral  SpO2: 96% 98%  98%  Weight:   86.1 kg   Height:   5\' 11"  (1.803 m)     Constitutional: NAD, calm, comfortable Vitals:   08/16/18 0901 08/16/18 0906 08/16/18 0907 08/16/18 1316  BP:  117/82    Pulse:  100  100  Resp:  20  (!) 22  Temp:  98.7 F (37.1 C)  99.5 F (37.5 C)  TempSrc:  Oral  Oral  SpO2: 96% 98%  98%  Weight:   86.1 kg   Height:   5\' 11"  (1.803 m)    Eyes: PERRL, lids and conjunctivae normal ENMT: Mucous membranes are moist. Posterior pharynx clear of any exudate or lesions.Normal dentition.  Neck: normal, supple, no masses, no thyromegaly Respiratory: clear to auscultation bilaterally, no wheezing, no  crackles. Normal respiratory effort. No accessory muscle use.  Cardiovascular: Regular rate and rhythm, no murmurs / rubs / gallops. No extremity edema. 2+ pedal pulses. No carotid bruits.  Abdomen: no tenderness, no masses palpated. No hepatosplenomegaly. Bowel sounds positive.  Musculoskeletal: no clubbing / cyanosis. No joint deformity upper and lower extremities. Good ROM along hip flexion bilaterally, no contractures. Normal muscle tone.  Neurologic: CN 2-12 grossly intact. Sensation intact, DTR normal. Strength 5/5 in all 4.  Psychiatric: Normal judgment and insight. Alert and oriented x 3. Normal mood.  SKIN/catheters: No signs of infection/tenderness/swelling at previous surgical site, sutures noted.  No rashes, lesions, ulcers. No induration  Labs on Admission: I have personally reviewed following labs and imaging studies  CBC: Recent Labs  Lab 08/16/18 0951  WBC 8.7  NEUTROABS 6.7  HGB 12.5*  HCT 38.1*  MCV 95.7  PLT 323   Basic Metabolic Panel: Recent Labs  Lab 08/16/18 0951  NA 135  K 3.8  CL 104  CO2 21*  GLUCOSE 98  BUN 11  CREATININE 0.88  CALCIUM 9.0  MG 1.9  PHOS 3.0   GFR: Estimated Creatinine Clearance: 76.1 mL/min (by C-G formula based on SCr of 0.88 mg/dL). Liver Function Tests: Recent Labs  Lab 08/16/18 0951  AST 19  ALT 15  ALKPHOS 52  BILITOT 0.6  PROT 5.6*  ALBUMIN 3.1*   No results for input(s): LIPASE, AMYLASE in the last 168 hours. Recent Labs  Lab 08/16/18 0953  AMMONIA 31   Coagulation Profile: Recent Labs  Lab 08/16/18 0951  INR 1.02   Cardiac Enzymes: No results for input(s): CKTOTAL, CKMB, CKMBINDEX, TROPONINI in the last 168 hours. BNP (last 3 results) No results for input(s): PROBNP in the last 8760 hours. HbA1C: No results for input(s): HGBA1C in the last 72 hours. CBG: No results for input(s): GLUCAP in the last 168 hours. Lipid Profile: No results for input(s): CHOL, HDL, LDLCALC, TRIG, CHOLHDL, LDLDIRECT in  the last 72 hours. Thyroid Function Tests: Recent Labs    08/16/18 0953  TSH 0.615   Anemia Panel: No results for input(s): VITAMINB12, FOLATE, FERRITIN, TIBC, IRON, RETICCTPCT in the last 72 hours. Urine analysis:    Component  Value Date/Time   COLORURINE YELLOW 08/16/2018 [place] 08/16/2018 1131   LABSPEC 1.010 08/16/2018 1131   PHURINE 6.0 08/16/2018 1131   GLUCOSEU NEGATIVE 08/16/2018 1131   HGBUR SMALL (A) 08/16/2018 1131   BILIRUBINUR NEGATIVE 08/16/2018 1131   KETONESUR NEGATIVE 08/16/2018 1131   PROTEINUR NEGATIVE 08/16/2018 1131   UROBILINOGEN 0.2 01/22/2015 0939   NITRITE NEGATIVE 08/16/2018 [place] 08/16/2018 1131    Radiological Exams on Admission: Dg Chest 2 View  Result Date: 08/16/2018 CLINICAL DATA:  Mental status change. EXAM: CHEST - 2 VIEW COMPARISON:  Radiographs of January 22, 2015. CT scan of July 23, 2018. FINDINGS: The heart size and mediastinal contours are within normal limits. Both lungs are clear. No pneumothorax or pleural effusion is noted. Narrowing of right subacromial space is noted consistent with chronic rotator cuff injury. IMPRESSION: No active cardiopulmonary disease. Electronically Signed   By: Marijo Conception, M.D.   On: 08/16/2018 10:34   Ct Head Wo Contrast  Result Date: 08/16/2018 CLINICAL DATA:  77 year old male with a history hallucinations EXAM: CT HEAD WITHOUT CONTRAST TECHNIQUE: Contiguous axial images were obtained from the base of the skull through the vertex without intravenous contrast. COMPARISON:  09/09/2010 FINDINGS: Brain: No acute intracranial hemorrhage. No midline shift or mass effect. Gray-white differentiation maintained. Unremarkable appearance of the ventricular system. Vascular: Unremarkable. Skull: No acute fracture.  No aggressive bone lesion identified. Sinuses/Orbits: Unremarkable appearance of the orbits. Mastoid air cells clear. No middle ear effusion. No significant sinus  disease. Other: None IMPRESSION: Negative head CT for acute intracranial abnormality Electronically Signed   By: Corrie Mckusick D.O.   On: 08/16/2018 10:47    EKG: Independently reviewed.  Normal sinus rhythm.  QTc 424 ms     Assessment and Plan:   1.  Acute delirium: Secondary to medications (opiates/muscle relaxants/steroids) versus pain versus infection versus dehydration.  Patient does not have leukocytosis (in spite of being on steroids) but noted to have elevated lactate level.  Wife feels he has not been eating or drinking well.  Will admit with IV hydration and repeat lactate in a.m.  Will send blood cultures.  Will defer antibiotics without clear source of infection.  Await orthopedics opinion, may benefit from CT scan of the spine to rule out postsurgical epidural infection/discitis.  Hold sedatives for now.  Will treat pain with NSAIDs/tramadol as needed.  As patient was on chronic steroids, reluctant to abruptly discontinue Medrol pack.  Since he normally tolerates prednisone well, will order prednisone 20 mg twice daily and taper.  May benefit from Neurontin, will however not initiate now in concern for delirium/hallucinations which might obscure the picture.  Will start on Seroquel nightly and PRN (QTC okay)  2.  Leg /left hip pain: likely radicular pain from surgical intervention. Other possibilities metastatic disease and infection. Will re-image in concern for infection with elevated lactate . Dr [name] consulted by ED. Can consider neurontin if no infection. Steroids might be masking inflammatory response. Hip xray  3.  Hyperlipidemia: Resume Zetia  4.  Metastatic prostate cancer: Follow-up with primary oncologist upon discharge.  Resume chronic prednisone at 5 mg twice daily once he finishes the taper.  Resume tamsulosin  5. GERD: PPI  DVT prophylaxis: Lovenox  Code Status: Full code  Family Communication: Discussed with patient, wife and daughter (who works here and is an Therapist, sports)  as well as daughter-in-law at bedside. Health care proxy would be his wife Consults called:  Dr. [name] contacted by ED physician Admission status:  Patient admitted as observation as anticipated LOS less than 2 midnights.    Guilford Shi MD [facility] Pager [phone]  If 7PM-7AM, please contact night-coverage [email or web address] Password [email or web address]  08/16/2018, 1:39 PM

## 2018-08-16 NOTE — Progress Notes (Addendum)
Patient arrived to [phone] accompanied by family. Safety precautions and orders reviewed with patient. VSS. Patient requests to sit in chair at this time. Dr. Ronnald Ramp at bedside. No other distress noted or voice. TELE applied and confirmed. Will continue to monitor.   Ave Filter, RN

## 2018-08-16 NOTE — ED Provider Notes (Addendum)
[facility] DEPARTMENT Provider Note   CSN: [id number] Arrival date & time: 08/16/18  0848     History   Chief Complaint Chief Complaint  Patient presents with  . Weakness    HPI [name] is a 77 y.o. male.  HPI Was discharged from the hospital 8\20\2019 status post decompressive laminectomy with epidural tumor resection by Dr. [name].  He was doing well immediately postoperatively.  Pain had improved.  He was walking better than previously and did not immediately need a walker.  Over the past 3 days however the patient has exhibited episodes of confusion, particularly in the nature of hallucinating.  He has thought there was a motorcycle out in the yard when there was none.  He thought that his daughter had spent the night on their couch when she had not.  He is also become less steady with his gait.  His wife and daughter are now encouraging him to use a walker.  He has not had a fall episode.  Pain, which immediately after the surgery seem much better, and has been returning in the form of hip discomfort.  Patient has been taking oxycodone IR,  His wife reports that he is taking 2 tablets a day.  He had been taking Percocet prior to the surgery without any adverse effect.  He has taken 2 doses of Flexeril.  One last night, and one  2 days ago.  It was a new medication for him.  He also had been on 5 mg prednisone twice.  Medrol Dosepak was started yesterday for attempts at increased pain control.  Documented fever.  Patient denies that he has had chest pain or shortness of breath.  No vomiting or diarrhea however he is eating and drinking much less per his wife.  Yesterday he did not drink much and ate only a small amount of macaroni and cheese in the evening which was atypical. Past Medical History:  Diagnosis Date  . Arthritis   . Barrett's esophagus   . Cancer [facility])    prostate with mets  . GERD (gastroesophageal reflux disease)    takes Protonix  daily  . History of colon polyps   . Hyperlipidemia    takes Zetia and Welchol daily  . Joint pain   . Muscle spasm    takes Robaxin daily as needed    Patient Active Problem List   Diagnosis Date Noted  . 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 Prostate cancer ([place]) 07/24/2018  . Complete tear of right rotator cuff 02/20/2017  . Arthritis of right shoulder region 02/20/2017  . 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 COLONOSCOPY    . ESOPHAGOGASTRODUODENOSCOPY    . KNEE ARTHROSCOPY Right 2011  . LUMBAR LAMINECTOMY/DECOMPRESSION MICRODISCECTOMY Left 08/06/2018   Procedure: Laminectomy and Foraminotomy - Lumbar Three-Four- left;  Surgeon: Kary Kos, MD;  Location: [facility];  Service: Neurosurgery;  Laterality: Left;  left  . TOTAL HIP ARTHROPLASTY Left 05/07/2013   Procedure: LEFT TOTAL HIP ARTHROPLASTY ANTERIOR APPROACH;  Surgeon: Mcarthur Rossetti, MD;  Location: [facility];  Service: Orthopedics;  Laterality: Left;  . TOTAL HIP ARTHROPLASTY Right   . TOTAL HIP REVISION Right 02/02/2015   Procedure: TOTAL HIP REVISION;  Surgeon: Kerin Salen, MD;  Location: [facility];  Service: Orthopedics;  Laterality: Right;  Home Medications    Prior to Admission medications   Medication Sig Start Date End Date Taking? Authorizing Provider  Calcium Carb-Cholecalciferol (CALCIUM 1000 + D PO) Take 30 mLs by mouth daily. Calcium 1000 mg / Vit D 1000 iu   Yes [provider]  cyclobenzaprine (FLEXERIL) 10 MG tablet Take 1 tablet (10 mg total) by mouth 3 (three) times daily as needed for muscle spasms. 08/07/18  Yes Meyran, Ocie Cornfield, NP  cycloSPORINE (RESTASIS) 0.05 % ophthalmic emulsion Place 1 drop into both eyes 2 (two) times daily.   Yes [provider]  docusate sodium (COLACE) 100 MG capsule Take 100 mg by mouth 2  (two) times daily.   Yes [provider]  ezetimibe (ZETIA) 10 MG tablet Take 10 mg by mouth every morning.    Yes [provider]  ibuprofen (ADVIL,MOTRIN) 200 MG tablet Take 200 mg by mouth every 6 (six) hours as needed for moderate pain.   Yes [provider]  leuprolide (LUPRON) 30 MG injection Inject 45 mg into the muscle every 6 (six) months.   Yes [provider]  methylPREDNISolone (MEDROL) 4 MG tablet Take 4 mg by mouth as directed. 08/14/18  Yes [provider]  ondansetron (ZOFRAN) 4 MG tablet Take 4 mg by mouth every 8 (eight) hours as needed for nausea or vomiting.   Yes [provider]  oxyCODONE (ROXICODONE) 5 MG immediate release tablet Take 1 tablet (5 mg total) by mouth every 4 (four) hours as needed. Patient taking differently: Take 5 mg by mouth every 4 (four) hours as needed for moderate pain.  08/07/18 08/07/19 Yes Meyran, Ocie Cornfield, NP  pantoprazole (PROTONIX) 40 MG tablet Take 40 mg by mouth daily after supper.    Yes [provider]  predniSONE (DELTASONE) 5 MG tablet Take 1 tablet (5 mg total) by mouth 2 (two) times daily with a meal. 07/31/18  Yes Shadad, Mathis Dad, MD  tamsulosin (FLOMAX) 0.4 MG CAPS capsule Take 0.4 mg by mouth daily.  08/10/16  Yes [provider]  triamcinolone cream (KENALOG) 0.1 % Apply 1 application topically daily as needed (dry skin in the winter).    Yes [provider]  oxyCODONE-acetaminophen (PERCOCET/ROXICET) 5-325 MG tablet Take 1 tablet by mouth every 6 (six) hours as needed for severe pain. Patient not taking: Reported on 08/16/2018 07/16/18   Meredith Pel, MD    Family History No family history on file.  Social History Social History   Tobacco Use  . Smoking status: Former Research scientist (life sciences)  . Smokeless tobacco: Former Systems developer  . Tobacco comment: quit smoking 45yrs ago  Substance Use Topics  . Alcohol use: No  . Drug use: No     Allergies    Statins   Review of Systems Review of Systems 10 Systems reviewed and are negative for acute change except as noted in the HPI.   Physical Exam Updated Vital Signs BP 117/82 (BP Location: Right Arm)   Pulse 100   Temp 98.7 F (37.1 C) (Oral)   Resp 20   Ht 5\' 11"  (1.803 m)   Wt 86.1 kg   SpO2 98%   BMI 26.48 kg/m   Physical Exam  Constitutional: He is oriented to person, place, and time. He appears well-developed and well-nourished.  HENT:  Head: Normocephalic and atraumatic.  Mouth/Throat: Oropharynx is clear and moist.  Eyes: Pupils are equal, round, and reactive to light. EOM are normal.  Neck: Neck supple.  Cardiovascular: Normal  rate, regular rhythm, normal heart sounds and intact distal pulses.  Pulmonary/Chest: Effort normal and breath sounds normal.  Abdominal: Soft. Bowel sounds are normal. He exhibits no distension. There is no tenderness.  Musculoskeletal: Normal range of motion. He exhibits no edema.  Surgical wound healing well.  No erythema or drainage or discharge in the back.  Lateral lower extremities excellent condition.  No swelling.  Dorsalis pedis pulses 2+ and symmetric.  I can perform straight leg raise bilaterally without significant pain.  She can hold and elevate each extremity against resistance.  Neurological: He is alert and oriented to person, place, and time. He has normal strength. He exhibits normal muscle tone. Coordination normal. GCS eye subscore is 4. GCS verbal subscore is 5. GCS motor subscore is 6.  Skin: Skin is warm, dry and intact.  Psychiatric: He has a normal mood and affect.     ED Treatments / Results  Labs (all labs ordered are listed, but only abnormal results are displayed) Labs Reviewed  COMPREHENSIVE METABOLIC PANEL - Abnormal; Notable for the following components:      Result Value   CO2 21 (*)    Total Protein 5.6 (*)    Albumin 3.1 (*)    All other components within normal limits  BRAIN NATRIURETIC PEPTIDE -  Abnormal; Notable for the following components:   B Natriuretic Peptide 104.5 (*)    All other components within normal limits  CBC WITH DIFFERENTIAL/PLATELET - Abnormal; Notable for the following components:   RBC 3.98 (*)    Hemoglobin 12.5 (*)    HCT 38.1 (*)    All other components within normal limits  URINALYSIS, ROUTINE W REFLEX MICROSCOPIC - Abnormal; Notable for the following components:   Hgb urine dipstick SMALL (*)    Bacteria, UA RARE (*)    All other components within normal limits  I-STAT CG4 LACTIC ACID, ED - Abnormal; Notable for the following components:   Lactic Acid, Venous 2.00 (*)    All other components within normal limits  I-STAT CG4 LACTIC ACID, ED - Abnormal; Notable for the following components:   Lactic Acid, Venous 2.36 (*)    All other components within normal limits  URINE CULTURE  CULTURE, BLOOD (ROUTINE X 2)  CULTURE, BLOOD (ROUTINE X 2)  PROTIME-INR  MAGNESIUM  PHOSPHORUS  TSH  AMMONIA  BLOOD GAS, VENOUS  I-STAT TROPONIN, ED    EKG EKG Interpretation  Date/Time:  Thursday August 16 2018 08:56:40 EDT Ventricular Rate:  106 PR Interval:    QRS Duration: 84 QT Interval:  323 QTC Calculation: 429 R Axis:   -42 Text Interpretation:  Sinus tachycardia Inferior infarct, old no change from previous except rate increased Confirmed by Charlesetta Shanks [id number]) on 08/16/2018 9:08:40 AM   Radiology Dg Chest 2 View  Result Date: 08/16/2018 CLINICAL DATA:  Mental status change. EXAM: CHEST - 2 VIEW COMPARISON:  Radiographs of January 22, 2015. CT scan of July 23, 2018. FINDINGS: The heart size and mediastinal contours are within normal limits. Both lungs are clear. No pneumothorax or pleural effusion is noted. Narrowing of right subacromial space is noted consistent with chronic rotator cuff injury. IMPRESSION: No active cardiopulmonary disease. Electronically Signed   By: Marijo Conception, M.D.   On: 08/16/2018 10:34   Ct Head Wo Contrast  Result  Date: 08/16/2018 CLINICAL DATA:  77 year old male with a history hallucinations EXAM: CT HEAD WITHOUT CONTRAST TECHNIQUE: Contiguous axial images were obtained from the base of the skull  through the vertex without intravenous contrast. COMPARISON:  09/09/2010 FINDINGS: Brain: No acute intracranial hemorrhage. No midline shift or mass effect. Gray-white differentiation maintained. Unremarkable appearance of the ventricular system. Vascular: Unremarkable. Skull: No acute fracture.  No aggressive bone lesion identified. Sinuses/Orbits: Unremarkable appearance of the orbits. Mastoid air cells clear. No middle ear effusion. No significant sinus disease. Other: None IMPRESSION: Negative head CT for acute intracranial abnormality Electronically Signed   By: Corrie Mckusick D.O.   On: 08/16/2018 10:47    Procedures Procedures (including critical care time)  Medications Ordered in ED Medications  sodium chloride 0.9 % bolus 500 mL (500 mLs Intravenous New Bag/Given 08/16/18 1305)     Initial Impression / Assessment and Plan / ED Course  I have reviewed the triage vital signs and the nursing notes.  Pertinent labs & imaging results that were available during my care of the patient were reviewed by me and considered in my medical decision making (see chart for details).    Consult: Tried hospitalist for admission Consult: Pending to review with neurosurgery. 15:35 have reviewed with Dr. Ronnald Ramp of neurosurgery.  He will do consultation for patient that is admitted. Final Clinical Impressions(s) / ED Diagnoses   Final diagnoses:  Confusion  Hallucination  Post-operative state  Prostate cancer [facility])   Patient presents as per above.  He has had mental status change over the past 3 days.  Predominately has taken the form of hallucinations and now decreased oral intake.  She does remain alert and interactive.  He is not obtunded.  At this time, he is not exhibiting agitated delirium.  Patient is afebrile.   No obvious source of infection.  He is 10 days postoperative.  Surgical site is in excellent condition.  This may be medication related.  Patient has been on oxycodone, Flexeril (both in low doses) as well as steroids.  Plan will be for admission for mental status change. ED Discharge Orders    None       Charlesetta Shanks, MD 08/16/18 1441    Charlesetta Shanks, MD 08/16/18 1538

## 2018-08-16 NOTE — Progress Notes (Signed)
Patient seen and examined. Patient awake alert and oriented. Incision is CDI. I would not recommend an MRI right now at this early stage postop. Labs, chest xray and UA are all within normal limits. I do believe that this could be related to medication. Continue to monitor.

## 2018-08-16 NOTE — Progress Notes (Signed)
CRITICAL VALUE ALERT  Critical Value:  Lactic acid 2.4 per Sean Martinique from lab  Date & Time Notied:  08/16/18 1752  Provider Notified: Dr. Earnest Conroy  Orders Received/Actions taken: None at this time.   Ave Filter, RN

## 2018-08-17 ENCOUNTER — Encounter (HOSPITAL_COMMUNITY): Payer: Self-pay | Admitting: Internal Medicine

## 2018-08-17 DIAGNOSIS — Z9889 Other specified postprocedural states: Secondary | ICD-10-CM

## 2018-08-17 DIAGNOSIS — M25552 Pain in left hip: Secondary | ICD-10-CM

## 2018-08-17 DIAGNOSIS — R410 Disorientation, unspecified: Secondary | ICD-10-CM

## 2018-08-17 LAB — URINE CULTURE: CULTURE: NO GROWTH

## 2018-08-17 MED ORDER — POLYETHYLENE GLYCOL 3350 17 G PO PACK: 17.0000 g | PACK | Freq: Every day | ORAL | 0 refills | Status: AC | PRN

## 2018-08-17 MED ORDER — PREDNISONE 10 MG PO TABS: ORAL_TABLET | ORAL | 0 refills | Status: DC

## 2018-08-17 MED ORDER — TRAMADOL HCL 50 MG PO TABS: 50.0000 mg | ORAL_TABLET | Freq: Four times a day (QID) | ORAL | 0 refills | Status: DC | PRN

## 2018-08-17 NOTE — Progress Notes (Signed)
Pt discharge education and instructions completed with pt and spouse at bedside. Both voices understanding and denies any questions. Pt IV and telemetry removed; pt discharge home with spouse to transport him home. Pt to pick up electronically sent prescriptions from preferred pharmacy on file. Pt transported off unit via wheelchair with belongings and spouse to the side by volunteer. Delia Heady RN

## 2018-08-17 NOTE — Care Management Note (Signed)
Case Management Note  Patient Details  Name: HEWITT GARNER MRN: [id number] Date of Birth: [birth year]  Subjective/Objective:    Pt in with delirium. He is from home with his spouse. Pt has: walker, cane, and wheelchair at home.   PCP:  Dr Shelia Media No issues obtaining medications               Action/Plan: Pt discharging home with orders for [facility] services. CM provided choice and they selected [facility]. Tiffany with [facility] notified and accepted the referral. Wife to provide supervision at home and transportation to home.   Expected Discharge Date:                  Expected Discharge Plan:  [place]  In-House Referral:     Discharge planning Services  CM Consult  Post Acute Care Choice:    Choice offered to:  Patient, Spouse  DME Arranged:    DME Agency:     HH Arranged:  RN, PT [place] Agency:  [facility] (now [facility])  Status of Service:  Completed, signed off  If discussed at [place] of Stay Meetings, dates discussed:    Additional Comments:  Pollie Friar, RN 08/17/2018, 12:27 PM

## 2018-08-17 NOTE — Plan of Care (Signed)
  Problem: Education: Goal: Knowledge of General Education information will improve Description Including pain rating scale, medication(s)/side effects and non-pharmacologic comfort measures Outcome: Progressing Note:  POC reviewed with pt.; pt. resting better after Ultram.

## 2018-08-17 NOTE — Care Management Obs Status (Signed)
[place] NOTIFICATION   Patient Details  Name: [name] MRN: [id number] Date of Birth: November 06, 1941   Medicare Observation Status Notification Given:  Yes    Pollie Friar, RN 08/17/2018, 2:27 PM

## 2018-08-17 NOTE — Discharge Summary (Signed)
Physician Discharge Summary  [name] CVE:[id number] DOB: 02-07-1941 DOA: 08/16/2018  PCP: [name], MD  Admit date: 08/16/2018 Discharge date: 08/17/2018  Time spent: 65 minutes  Recommendations for Outpatient Follow-up:  1. Follow up with neuro surgery as scheduled 2. Home health RN and PT for safety and medication evaluation   Discharge Diagnoses:  Principal Problem:   Delirium Active Problems:   Prostate cancer ([place])   Left hip pain   Discharge Condition: stable at baseline  Diet recommendation: heart healthy  Filed Weights   08/16/18 0907  Weight: 86.1 kg    History of present illness:  [name] is a 77 y.o. male with history h/o metastatic prostate cancer, status post radiation treatment in 2018 followed by chemotherapy which was discontinued in July 2019 due to lack of adequate response but continued on Lupron therapy with shots every 6 months was admitted recently on August 19 and discharged on 8\20\2019 status post decompressive laminectomy with epidural tumor resection by Dr. [name]. Presented 8/29 with cc confusion and worsening LLE pain. He was discharged with oxycodone and flexeril and doing ok until 5 days ago developed worsening pain. Called MD who recommended Medrol pack instead of chronic prednisone, and bid oxy as well as prn motrin. Pain improved but he developed hallucination and confusion.   Hospital Course:  1.  Acute delirium: resolved at discharge.  Secondary to medications (opiates/muscle relaxants/steroids) versus pain versus infection versus dehydration.  Patient without leukocytosis (in spite of being on steroids) but noted to have elevated lactate level that trended down. provided with IV fluids and increased steroids. Minimize opiod and sedating meds. Tramadol for pain  2.  Leg /left hip pain: likely radicular pain from surgical intervention and/or sciatica. Xray with no acute abnormality. Improved at discharge. Will discharge with  [facility] PT and RN for safety and monitoring. Steroid taper as noted  3.  Hyperlipidemia: Zetia  4.  Metastatic prostate cancer: Follow-up with primary oncologist upon discharge.  Resume chronic prednisone at 5 mg twice daily once he finishes the taper.  Resume tamsulosin  5. GERD: PPI  Procedures:    Consultations:  Dr Waynard Reeds neurosurgery  Discharge Exam: Vitals:   08/17/18 0810 08/17/18 1219  BP: 105/74 135/77  Pulse: 82 92  Resp: 20 16  Temp: 98.5 F (36.9 C)   SpO2: 91% 94%    General: well nourished up in chair. Alert and oriented x3. No acute distress Cardiovascular: rrr no mgr no LE edema Respiratory: normal effort BS clear bilaterally  Discharge Instructions   Discharge Instructions    Diet - low sodium heart healthy   Complete by:  As directed    Discharge instructions   Complete by:  As directed    If pain worsens or changes in your hip, may need further imaging such as a CT scan   Increase activity slowly   Complete by:  As directed      Allergies as of 08/17/2018      Reactions   Statins Other (See Comments)   Muscle pain      Medication List    STOP taking these medications   cyclobenzaprine 10 MG tablet Commonly known as:  FLEXERIL   methylPREDNISolone 4 MG tablet Commonly known as:  MEDROL   oxyCODONE 5 MG immediate release tablet Commonly known as:  Oxy IR/ROXICODONE   oxyCODONE-acetaminophen 5-325 MG tablet Commonly known as:  PERCOCET/ROXICET     TAKE these medications   CALCIUM 1000 + D PO  Take 30 mLs by mouth daily. Calcium 1000 mg / Vit D 1000 iu   cycloSPORINE 0.05 % ophthalmic emulsion Commonly known as:  RESTASIS Place 1 drop into both eyes 2 (two) times daily.   docusate sodium 100 MG capsule Commonly known as:  COLACE Take 100 mg by mouth 2 (two) times daily.   ezetimibe 10 MG tablet Commonly known as:  ZETIA Take 10 mg by mouth every morning.   ibuprofen 200 MG tablet Commonly known as:  ADVIL,MOTRIN Take 200 mg  by mouth every 6 (six) hours as needed for moderate pain.   leuprolide 30 MG injection Commonly known as:  LUPRON Inject 45 mg into the muscle every 6 (six) months.   ondansetron 4 MG tablet Commonly known as:  ZOFRAN Take 4 mg by mouth every 8 (eight) hours as needed for nausea or vomiting.   pantoprazole 40 MG tablet Commonly known as:  PROTONIX Take 40 mg by mouth daily after supper.   polyethylene glycol packet Commonly known as:  MIRALAX / GLYCOLAX Take 17 g by mouth daily as needed for mild constipation.   predniSONE 10 MG tablet Commonly known as:  DELTASONE 20 mg BID x 3 days then 10 mg BID x 3 days then 5 mg BID going forward What changed:    medication strength  how much to take  how to take this  when to take this  additional instructions   tamsulosin 0.4 MG Caps capsule Commonly known as:  FLOMAX Take 0.4 mg by mouth daily.   traMADol 50 MG tablet Commonly known as:  ULTRAM Take 1 tablet (50 mg total) by mouth every 6 (six) hours as needed for severe pain.   triamcinolone cream 0.1 % Commonly known as:  KENALOG Apply 1 application topically daily as needed (dry skin in the winter).      Allergies  Allergen Reactions  . Statins Other (See Comments)    Muscle pain   Follow-up Information    [name], MD Follow up in 1 week(s).   Specialty:  Internal Medicine Contact information: [address] [place] [address] [phone]        [facility] Follow up.   Specialty:  Home Health Services Why:  They will contact you for the first visit. Contact information: [address] [place] [address] [phone]            The results of significant diagnostics from this hospitalization (including imaging, microbiology, ancillary and laboratory) are listed below for reference.    Significant Diagnostic Studies: Dg Chest 2 View  Result Date: 08/16/2018 CLINICAL DATA:  Mental status change. EXAM:  CHEST - 2 VIEW COMPARISON:  Radiographs of January 22, 2015. CT scan of July 23, 2018. FINDINGS: The heart size and mediastinal contours are within normal limits. Both lungs are clear. No pneumothorax or pleural effusion is noted. Narrowing of right subacromial space is noted consistent with chronic rotator cuff injury. IMPRESSION: No active cardiopulmonary disease. Electronically Signed   By: Marijo Conception, M.D.   On: 08/16/2018 10:34   Dg Lumbar Spine 2-3 Views  Result Date: 08/06/2018 CLINICAL DATA:  Localization image for patient undergoing L3-4 laminotomy. EXAM: LUMBAR SPINE - 2-3 VIEW COMPARISON:  MRI lumbar spine 06/24/2018. FINDINGS: 2 lateral views of the lumbar spine are provided. On the first image, no probe is identified. On the second image, there is a clamp on the L3 spinous process and a probe at the  level of the L3 pedicles. IMPRESSION: Localization as above. Electronically Signed   By: Inge Rise M.D.   On: 08/06/2018 14:42   Ct Head Wo Contrast  Result Date: 08/16/2018 CLINICAL DATA:  77 year old male with a history hallucinations EXAM: CT HEAD WITHOUT CONTRAST TECHNIQUE: Contiguous axial images were obtained from the base of the skull through the vertex without intravenous contrast. COMPARISON:  09/09/2010 FINDINGS: Brain: No acute intracranial hemorrhage. No midline shift or mass effect. Gray-white differentiation maintained. Unremarkable appearance of the ventricular system. Vascular: Unremarkable. Skull: No acute fracture.  No aggressive bone lesion identified. Sinuses/Orbits: Unremarkable appearance of the orbits. Mastoid air cells clear. No middle ear effusion. No significant sinus disease. Other: None IMPRESSION: Negative head CT for acute intracranial abnormality Electronically Signed   By: Corrie Mckusick D.O.   On: 08/16/2018 10:47   Ct Chest W Contrast  Result Date: 07/23/2018 CLINICAL DATA:  Prostate cancer with bone metastases, XRT complete, chemotherapy ongoing EXAM:  CT CHEST, ABDOMEN, AND PELVIS WITH CONTRAST TECHNIQUE: Multidetector CT imaging of the chest, abdomen and pelvis was performed following the standard protocol during bolus administration of intravenous contrast. CONTRAST:  120mL ISOVUE-300 IOPAMIDOL (ISOVUE-300) INJECTION 61% COMPARISON:  Partial comparison CT lumbar spine dated 04/02/2017. FINDINGS: CT CHEST FINDINGS Cardiovascular: Heart is normal in size.  No pericardial effusion. No evidence of thoracic aortic aneurysm. Mild atherosclerotic calcifications of the aortic root/arch. Mild coronary atherosclerosis of the LAD and right coronary artery. Mediastinum/Nodes: Small mediastinal lymph nodes which do not meet pathologic CT size criteria. No suspicious axillary lymphadenopathy. Visualized thyroid is unremarkable. Lungs/Pleura: Mild biapical pleural-parenchymal scarring. Mild pleural-parenchymal scarring in the posterior right upper and bilateral lower lobes. Mild subpleural reticulation/fibrosis in the lungs bilaterally, particularly the anterior left upper lobe and posterior bilateral lower lobes. No suspicious pulmonary nodules. No focal consolidation. No pleural effusion or pneumothorax. Musculoskeletal: Degenerative changes of the thoracic spine. No focal osseous lesions. CT ABDOMEN PELVIS FINDINGS Hepatobiliary: Liver is within normal limits. Gallbladder is unremarkable. No intrahepatic or extrahepatic ductal dilatation. Pancreas: Within normal limits. Spleen: Within normal limits. Adrenals/Urinary Tract: 3.4 cm left adrenal mass (series 2/image 86), previously 1.3 cm on prior lumbar spine CT, suspicious for metastasis. Right adrenal gland is within normal limits. 9.5 cm lateral left lower pole renal cyst. 3.2 cm anterior interpolar right renal cyst (series 2/image 32). No hydronephrosis. Bladder is underdistended and partially obscured by streak artifact. Stomach/Bowel: Stomach is within normal limits. No evidence of bowel obstruction. Normal appendix  (series 2/image 104). Mild left colonic stool burden. Vascular/Lymphatic: No evidence of abdominal aortic aneurysm. Atherosclerotic calcifications of the abdominal aorta and branch vessels. 1.8 cm right common iliac node (series 2/image 90). 1.8 cm bilateral para-aortic nodes (series 2/image 78). Reproductive: Brachytherapy seeds in the prostate. Other: No abdominopelvic ascites. Musculoskeletal: Bilateral hip arthroplasties, without evidence of complication. Mild superior endplate compression fracture deformity at L3. Associated sclerosis in the vertebral body, suggesting pathologic fracture. Moderate superior endplate compression fracture deformity at L5. No retropulsion. These are better evaluated on prior MRI. IMPRESSION: Brachytherapy seeds in the prostate. Retroperitoneal nodal metastases, as above. Left adrenal metastasis, progressive from 2018. Mild superior endplate pathologic compression fracture at L3. Moderate superior endplate compression fracture deformity at L5. These are unchanged from prior MRI. No evidence of metastatic disease in the chest. Electronically Signed   By: Julian Hy M.D.   On: 07/23/2018 10:21   Ct Abdomen Pelvis W Contrast  Result Date: 07/23/2018 CLINICAL DATA:  Prostate cancer with bone metastases,  XRT complete, chemotherapy ongoing EXAM: CT CHEST, ABDOMEN, AND PELVIS WITH CONTRAST TECHNIQUE: Multidetector CT imaging of the chest, abdomen and pelvis was performed following the standard protocol during bolus administration of intravenous contrast. CONTRAST:  138mL ISOVUE-300 IOPAMIDOL (ISOVUE-300) INJECTION 61% COMPARISON:  Partial comparison CT lumbar spine dated 04/02/2017. FINDINGS: CT CHEST FINDINGS Cardiovascular: Heart is normal in size.  No pericardial effusion. No evidence of thoracic aortic aneurysm. Mild atherosclerotic calcifications of the aortic root/arch. Mild coronary atherosclerosis of the LAD and right coronary artery. Mediastinum/Nodes: Small mediastinal  lymph nodes which do not meet pathologic CT size criteria. No suspicious axillary lymphadenopathy. Visualized thyroid is unremarkable. Lungs/Pleura: Mild biapical pleural-parenchymal scarring. Mild pleural-parenchymal scarring in the posterior right upper and bilateral lower lobes. Mild subpleural reticulation/fibrosis in the lungs bilaterally, particularly the anterior left upper lobe and posterior bilateral lower lobes. No suspicious pulmonary nodules. No focal consolidation. No pleural effusion or pneumothorax. Musculoskeletal: Degenerative changes of the thoracic spine. No focal osseous lesions. CT ABDOMEN PELVIS FINDINGS Hepatobiliary: Liver is within normal limits. Gallbladder is unremarkable. No intrahepatic or extrahepatic ductal dilatation. Pancreas: Within normal limits. Spleen: Within normal limits. Adrenals/Urinary Tract: 3.4 cm left adrenal mass (series 2/image 86), previously 1.3 cm on prior lumbar spine CT, suspicious for metastasis. Right adrenal gland is within normal limits. 9.5 cm lateral left lower pole renal cyst. 3.2 cm anterior interpolar right renal cyst (series 2/image 32). No hydronephrosis. Bladder is underdistended and partially obscured by streak artifact. Stomach/Bowel: Stomach is within normal limits. No evidence of bowel obstruction. Normal appendix (series 2/image 104). Mild left colonic stool burden. Vascular/Lymphatic: No evidence of abdominal aortic aneurysm. Atherosclerotic calcifications of the abdominal aorta and branch vessels. 1.8 cm right common iliac node (series 2/image 90). 1.8 cm bilateral para-aortic nodes (series 2/image 78). Reproductive: Brachytherapy seeds in the prostate. Other: No abdominopelvic ascites. Musculoskeletal: Bilateral hip arthroplasties, without evidence of complication. Mild superior endplate compression fracture deformity at L3. Associated sclerosis in the vertebral body, suggesting pathologic fracture. Moderate superior endplate compression  fracture deformity at L5. No retropulsion. These are better evaluated on prior MRI. IMPRESSION: Brachytherapy seeds in the prostate. Retroperitoneal nodal metastases, as above. Left adrenal metastasis, progressive from 2018. Mild superior endplate pathologic compression fracture at L3. Moderate superior endplate compression fracture deformity at L5. These are unchanged from prior MRI. No evidence of metastatic disease in the chest. Electronically Signed   By: Julian Hy M.D.   On: 07/23/2018 10:21   Dg Hip Unilat With Pelvis 2-3 Views Left  Result Date: 08/16/2018 CLINICAL DATA:  Acute LEFT hip pain for 2 weeks following lumbar surgery. History bilateral hip replacements. EXAM: DG HIP (WITH OR WITHOUT PELVIS) 2-3V LEFT COMPARISON:  06/13/2018 FINDINGS: Bilateral total hip arthroplasties noted. No acute fracture or dislocation. Degenerative changes in the LOWER lumbar spine are again noted. Prostate seeds are present. No suspicious bony lesions are identified. IMPRESSION: No evidence of acute abnormality. Bilateral hip arthroplasties without complicating features. Electronically Signed   By: Margarette Canada M.D.   On: 08/16/2018 18:28    Microbiology: Recent Results (from the past 240 hour(s))  Culture, blood (routine x 2)     Status: None (Preliminary result)   Collection Time: 08/16/18  9:45 AM  Result Value Ref Range Status   Specimen Description BLOOD LEFT HAND  Final   Special Requests   Final    BOTTLES DRAWN AEROBIC AND ANAEROBIC Blood Culture adequate volume   Culture   Final    NO GROWTH 1 DAY  Performed at [facility], [place] [address], [place], [place] [address]    Report Status PENDING  Incomplete  Culture, blood (routine x 2)     Status: None (Preliminary result)   Collection Time: 08/16/18  9:55 AM  Result Value Ref Range Status   Specimen Description BLOOD RIGHT HAND  Final   Special Requests   Final    BOTTLES DRAWN AEROBIC AND ANAEROBIC Blood Culture results may not be  optimal due to an inadequate volume of blood received in culture bottles   Culture   Final    NO GROWTH 1 DAY Performed at [facility], [place] [address] [place], [place] [address]    Report Status PENDING  Incomplete  Urine culture     Status: None   Collection Time: 08/16/18 11:31 AM  Result Value Ref Range Status   Specimen Description URINE, RANDOM  Final   Special Requests NONE  Final   Culture   Final    NO GROWTH Performed at [facility], [address] [place], [place] [address]    Report Status 08/17/2018 FINAL  Final     Labs: Basic Metabolic Panel: Recent Labs  Lab 08/16/18 0951  NA 135  K 3.8  CL 104  CO2 21*  GLUCOSE 98  BUN 11  CREATININE 0.88  CALCIUM 9.0  MG 1.9  PHOS 3.0   Liver Function Tests: Recent Labs  Lab 08/16/18 0951  AST 19  ALT 15  ALKPHOS 52  BILITOT 0.6  PROT 5.6*  ALBUMIN 3.1*   No results for input(s): LIPASE, AMYLASE in the last 168 hours. Recent Labs  Lab 08/16/18 0953  AMMONIA 31   CBC: Recent Labs  Lab 08/16/18 0951  WBC 8.7  NEUTROABS 6.7  HGB 12.5*  HCT 38.1*  MCV 95.7  PLT 323   Cardiac Enzymes: No results for input(s): CKTOTAL, CKMB, CKMBINDEX, TROPONINI in the last 168 hours. BNP: BNP (last 3 results) Recent Labs    08/16/18 0951  BNP 104.5*    ProBNP (last 3 results) No results for input(s): PROBNP in the last 8760 hours.  CBG: No results for input(s): GLUCAP in the last 168 hours.     Signed:  Radene Gunning MD.  [facility] 08/17/2018, 3:34 PM

## 2018-08-21 LAB — CULTURE, BLOOD (ROUTINE X 2)
CULTURE: NO GROWTH
Culture: NO GROWTH
SPECIAL REQUESTS: ADEQUATE

## 2018-08-22 DIAGNOSIS — Z87891 Personal history of nicotine dependence: Secondary | ICD-10-CM | POA: Diagnosis not present

## 2018-08-22 DIAGNOSIS — Z483 Aftercare following surgery for neoplasm: Secondary | ICD-10-CM | POA: Diagnosis not present

## 2018-08-22 DIAGNOSIS — C61 Malignant neoplasm of prostate: Secondary | ICD-10-CM | POA: Diagnosis not present

## 2018-08-22 DIAGNOSIS — M75121 Complete rotator cuff tear or rupture of right shoulder, not specified as traumatic: Secondary | ICD-10-CM | POA: Diagnosis not present

## 2018-08-22 DIAGNOSIS — M19011 Primary osteoarthritis, right shoulder: Secondary | ICD-10-CM | POA: Diagnosis not present

## 2018-08-22 DIAGNOSIS — Z96641 Presence of right artificial hip joint: Secondary | ICD-10-CM | POA: Diagnosis not present

## 2018-08-22 DIAGNOSIS — C7951 Secondary malignant neoplasm of bone: Secondary | ICD-10-CM | POA: Diagnosis not present

## 2018-08-22 DIAGNOSIS — Z86010 Personal history of colonic polyps: Secondary | ICD-10-CM | POA: Diagnosis not present

## 2018-08-23 ENCOUNTER — Telehealth: Payer: Self-pay

## 2018-08-23 ENCOUNTER — Telehealth: Payer: Self-pay | Admitting: Pharmacist

## 2018-08-23 ENCOUNTER — Inpatient Hospital Stay (HOSPITAL_BASED_OUTPATIENT_CLINIC_OR_DEPARTMENT_OTHER): Payer: Medicare Other | Admitting: Oncology

## 2018-08-23 ENCOUNTER — Inpatient Hospital Stay: Payer: Medicare Other | Attending: Oncology

## 2018-08-23 ENCOUNTER — Inpatient Hospital Stay: Payer: Medicare Other

## 2018-08-23 VITALS — BP 146/74 | HR 83 | Temp 98.2°F | Resp 18 | Ht 71.0 in | Wt 189.0 lb

## 2018-08-23 DIAGNOSIS — C7951 Secondary malignant neoplasm of bone: Secondary | ICD-10-CM | POA: Insufficient documentation

## 2018-08-23 DIAGNOSIS — C61 Malignant neoplasm of prostate: Secondary | ICD-10-CM

## 2018-08-23 DIAGNOSIS — Z79899 Other long term (current) drug therapy: Secondary | ICD-10-CM | POA: Diagnosis not present

## 2018-08-23 DIAGNOSIS — C7970 Secondary malignant neoplasm of unspecified adrenal gland: Secondary | ICD-10-CM | POA: Insufficient documentation

## 2018-08-23 LAB — CMP (CANCER CENTER ONLY)
ALBUMIN: 3.3 g/dL — AB (ref 3.5–5.0)
ALT: 11 U/L (ref 0–44)
ANION GAP: 8 (ref 5–15)
AST: 12 U/L — ABNORMAL LOW (ref 15–41)
Alkaline Phosphatase: 83 U/L (ref 38–126)
BUN: 20 mg/dL (ref 8–23)
CHLORIDE: 107 mmol/L (ref 98–111)
CO2: 25 mmol/L (ref 22–32)
Calcium: 9.1 mg/dL (ref 8.9–10.3)
Creatinine: 0.81 mg/dL (ref 0.61–1.24)
GFR, Est AFR Am: >60 mL/min (ref >60)
GFR, Estimated: >60 mL/min (ref >60)
GLUCOSE: 120 mg/dL — AB (ref 70–99)
POTASSIUM: 4.6 mmol/L (ref 3.5–5.1)
SODIUM: 140 mmol/L (ref 135–145)
Total Bilirubin: 0.3 mg/dL (ref 0.3–1.2)
Total Protein: 6.3 g/dL — ABNORMAL LOW (ref 6.5–8.1)

## 2018-08-23 LAB — CBC WITH DIFFERENTIAL (CANCER CENTER ONLY)
BASOS ABS: 0 10*3/uL (ref 0.0–0.1)
Basophils Relative: 0 %
EOS ABS: 0.1 10*3/uL (ref 0.0–0.5)
EOS PCT: 1 %
HCT: 39.8 % (ref 38.4–49.9)
Hemoglobin: 13.1 g/dL (ref 13.0–17.1)
Lymphocytes Relative: 9 %
Lymphs Abs: 0.8 10*3/uL — ABNORMAL LOW (ref 0.9–3.3)
MCH: 31.2 pg (ref 27.2–33.4)
MCHC: 32.9 g/dL (ref 32.0–36.0)
MCV: 94.8 fL (ref 79.3–98.0)
MONO ABS: 0.6 10*3/uL (ref 0.1–0.9)
Monocytes Relative: 7 %
Neutro Abs: 6.7 10*3/uL — ABNORMAL HIGH (ref 1.5–6.5)
Neutrophils Relative %: 83 %
PLATELETS: 325 10*3/uL (ref 140–400)
RBC: 4.2 MIL/uL (ref 4.20–5.82)
RDW: 14.9 % — AB (ref 11.0–14.6)
WBC Count: 8.1 10*3/uL (ref 4.0–10.3)

## 2018-08-23 MED ORDER — DENOSUMAB 120 MG/1.7ML ~~LOC~~ SOLN
120.0000 mg | Freq: Once | SUBCUTANEOUS | Status: AC
  Administered 2018-08-23: 120 mg via SUBCUTANEOUS

## 2018-08-23 MED ORDER — ABIRATERONE ACETATE 250 MG PO TABS: 1000.0000 mg | ORAL_TABLET | Freq: Every day | ORAL | 0 refills | Status: DC

## 2018-08-23 NOTE — Telephone Encounter (Signed)
Oral Chemotherapy Pharmacist Encounter   I spoke with patient and spouse in scheduling area for overview of: Zytiga.   Counseled patient on administration, dosing, side effects, monitoring, drug-food interactions, safe handling, storage, and disposal.  Patient will take Zytiga 250mg  tablets, 4 tablets (1000mg ) by mouth once daily on an empty stomach, 1 hour before or 2 hours after a meal.  Patient states he will take his Zyitga 1st thing in the morning and will wait at least 1 hour before eating.  Patient is taking take prednisone 5mg  tablet, 1 tablet by mouth twice daily already.  Zytiga start date: 08/29/18  Adverse effects include but are not limited to: peripheral edema, GI upset, hypertension, hot flashes, fatigue, and arthralgias.    Reviewed with patient importance of keeping a medication schedule and plan for any missed doses.  Mr. and Mrs. Royal voiced understanding and appreciation.   All questions answered. Medication reconciliation performed and medication/allergy list updated.  Insurance authorization will be submitted. We will plan for Zytiga to ship from the [facility] on Monday, 08/27/2018 for delivery to patient's home on 08/28/2018. Plan Zytiga start as 08/29/2018  Patient knows to call the office with questions or concerns.  Oral Oncology Clinic will continue to follow.  Johny Drilling, PharmD, BCPS, BCOP  08/23/2018 4:01 PM Oral Oncology Clinic [phone]

## 2018-08-23 NOTE — Telephone Encounter (Signed)
Printed avs and calender of upcoming appointment. Per 9/5 los 

## 2018-08-23 NOTE — Progress Notes (Signed)
Hematology and Oncology Follow Up Visit  [name] [id number] [birth year] 77 y.o. 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  He was initially diagnosed with prostate cancer in 2016 with a Gleason score of 5+4 = 9 and a PSA of 13.56.   Prior Therapy: He was treated there with external beam radiation and brachii therapy in January 2017 and completed 2 years of androgen deprivation.  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  Metastatic work-up at that time showed bone involvement as well as lymphadenopathy.    He was treated under the care of Dr. [name] at [facility] initially with enzalutamide started in May 2018 until January 2019 where he presented with progression of disease with bony metastasis, adrenal nodule as well as worsening adenopathy.    Taxotere chemotherapy initially at 60 mg/m and that was reduced to 45 mg/m after cycle 6.  His last treatment was in July 2019 given at [facility].  Repeat imaging studies showed his lymphadenopathy has improved but he did develop worsening bony metastasis based on a lumbar spine obtained on June 24, 2018.  The MRI showed a complete replacement of L3 vertebral body with tumor the tumor in the ventral epidural space behind L3 greater to the left.  Bulky retroperitoneal adenopathy was slightly improved as mentioned.    He is status post decompressive laminectomy and removal of L3 tumor completed on 08/06/2018.  Current therapy:  Under evaluation to start Zytiga.   Androgen deprivation under the care of Dr. [name] last injection given in August 2019.  Xgeva 120 mg every 2 months.  His next injection scheduled for 08/23/2018.  Interim History: [name] presents today for a follow-up visit.   Since the last visit, he completed decompressive laminectomy performed by Dr. [name] without any complications.  He was hospitalized briefly on August 17, 2018 after presenting with symptoms of delirium related to his pain medication.  He is a recovering quite nicely at this time and have regained most activities of daily living.  He still has some pain around the surgery site but no neurological deficits.  His appetite is improved and his performance status has not declined.  He denies any worsening bone pain or pathological fractures.   He does not report any headaches, blurry vision, syncope or seizures. Does not report any fevers, chills or sweats.  Does not report any cough, wheezing or hemoptysis.  Does not report any chest pain, palpitation, orthopnea or leg edema.  Does not report any nausea, vomiting or abdominal pain.  Does not report any constipation or diarrhea.  Does not report any skeletal complaints.    Does not report frequency, urgency or hematuria.  Does not report any skin rashes or lesions.  Does not report any lymphadenopathy or petechiae.  Does not report any anxiety or depression.  Remaining review of systems is negative.    Medications: I have reviewed the patient's current medications.  Current Outpatient Medications  Medication Sig Dispense Refill  . Calcium Carb-Cholecalciferol (CALCIUM 1000 + D PO) Take 30 mLs by mouth daily. Calcium 1000 mg / Vit D 1000 iu    . cycloSPORINE (RESTASIS) 0.05 % ophthalmic emulsion Place 1 drop into both eyes 2 (two) times daily.    Marland Kitchen docusate sodium (COLACE) 100 MG capsule  Take 100 mg by mouth 2 (two) times daily.    Marland Kitchen ezetimibe (ZETIA) 10 MG tablet Take 10 mg by mouth every morning.     Marland Kitchen ibuprofen (ADVIL,MOTRIN) 200 MG tablet Take 200 mg by mouth every 6 (six) hours as needed for moderate pain.    Marland Kitchen leuprolide (LUPRON) 30 MG injection Inject 45 mg into the muscle every 6 (six) months.    . ondansetron (ZOFRAN) 4 MG tablet Take 4 mg by mouth  every 8 (eight) hours as needed for nausea or vomiting.    . pantoprazole (PROTONIX) 40 MG tablet Take 40 mg by mouth daily after supper.     . polyethylene glycol (MIRALAX / GLYCOLAX) packet Take 17 g by mouth daily as needed for mild constipation. 14 each 0  . predniSONE (DELTASONE) 10 MG tablet 20 mg BID x 3 days then 10 mg BID x 3 days then 5 mg BID going forward 20 tablet 0  . tamsulosin (FLOMAX) 0.4 MG CAPS capsule Take 0.4 mg by mouth daily.     . traMADol (ULTRAM) 50 MG tablet Take 1 tablet (50 mg total) by mouth every 6 (six) hours as needed for severe pain. 20 tablet 0  . triamcinolone cream (KENALOG) 0.1 % Apply 1 application topically daily as needed (dry skin in the winter).      No current facility-administered medications for this visit.      Allergies:  Allergies  Allergen Reactions  . Statins Other (See Comments)    Muscle pain    Past Medical History, Surgical history, Social history, and Family History were reviewed and updated.  Review of Systems:  Remaining ROS negative.  Physical Exam: Blood pressure (!) 146/74, pulse 83, temperature 98.2 F (36.8 C), temperature source Oral, resp. rate 18, height 5\' 11"  (1.803 m), weight 189 lb (85.7 kg), SpO2 99 %.   ECOG: 1 General appearance: alert and cooperative appeared without distress. Head: Normocephalic, without obvious abnormality Oropharynx: No oral thrush or ulcers. Eyes: No scleral icterus.  Pupils are equal and round reactive to light. Lymph nodes: Cervical, supraclavicular, and axillary nodes normal. Heart:regular rate and rhythm, S1, S2 normal, no murmur, click, rub or gallop Lung:chest clear, no wheezing, rales, normal symmetric air entry Abdomin: soft, non-tender, without masses or organomegaly. Neurological: No motor, sensory deficits.  Intact deep tendon reflexes. Skin: No rashes or lesions.  No ecchymosis or petechiae. Musculoskeletal: No joint deformity or effusion.     Lab Results: Lab  Results  Component Value Date   WBC 8.1 08/23/2018   HGB 13.1 08/23/2018   HCT 39.8 08/23/2018   MCV 94.8 08/23/2018   PLT 325 08/23/2018     Chemistry      Component Value Date/Time   NA 135 08/16/2018 0951   K 3.8 08/16/2018 0951   CL 104 08/16/2018 0951   CO2 21 (L) 08/16/2018 0951   BUN 11 08/16/2018 0951   CREATININE 0.88 08/16/2018 0951      Component Value Date/Time   CALCIUM 9.0 08/16/2018 0951   ALKPHOS 52 08/16/2018 0951   AST 19 08/16/2018 0951   ALT 15 08/16/2018 0951   BILITOT 0.6 08/16/2018 0951        Impression and Plan:  77 year old man with the following:  1.  Castration-resistant prostate cancer with disease to the bone, Gleason score 5+4 = 9 PSA 13.56 in 2016.    He is status post therapy outlined above and his most recent PSA on July 18, 2018 was  21.57.  Options of therapy were reviewed again and is agreeable to proceed with Zytiga.  Risks and benefits associated with this medication complications nausea, fatigue, edema among others were reviewed.  He understands that given his exposure to [facility] in the past, it is possible that he might not have a robust benefit to [place].  Additional therapy would include [name] chemotherapy in the future.  He will start Zytiga 1000 mg daily and is already on tapering doses of prednisone which she will continue at 5 mg daily.  We will also send his recent tumor that was removed for next generation sequencing and foundation medicine testing.  He is actionable mutation can be used in the future.   2.  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  The pathology revealed prostate cancer metastasis.  He will be evaluated by Dr. Tammi Klippel for possible additional radiation therapy.  3.  Bone directed therapy: Risks and benefits associated with Delton See was discussed today.  Long-term complications such as osteonecrosis of the jaw and hypocalcemia were reviewed.  He will receive the next injection on August 23, 2018.   4.  Androgen deprivation: He is currently on Lupron and received the last injection under the care of Dr. [name] in August 2019.  This will be repeated in 6 months.  5.  Pain: Related to his spinal metastasis: Resolved after his recent surgery and appears to be recovering well.  6.  Follow-up: We will be in the next 4 to 6 weeks to follow his progress.  25  minutes was spent with the patient face-to-face today.  More than 50% of time was dedicated to reviewing the natural course of his disease, his imaging studies, pathology results and correlating his plan of care.    Zola Button, MD 9/5/20192:48 PM

## 2018-08-23 NOTE — Telephone Encounter (Signed)
Oral Oncology Pharmacist Encounter  Received new prescription for Zytiga (abiraterone) for the treatment of metastatic, castration-resistant prostate cancer in conjunction with prednisone and androgen deprivation therapy, planned duration until disease progression or unacceptable toxicity. Patient has progressed through treatment on Xtandi (enzalutamide) and Taxotere (docetaxel)  Labs from [facility]) assessed, no renal or hepatic dysfunction noted.  Current medication list in Epic reviewed, no significant DDIs with Zytiga identified.  Prescription will be sent to appropriate specialty pharmacy once received by MD. [place] has already been secured through [employer].  Prednisone prescription has been sent to [facility] on [address].  Oral Oncology Clinic will continue to follow for insurance authorization, initial counseling and start date.  Johny Drilling, PharmD, BCPS, BCOP  08/23/2018  3:38 PM  Oral Oncology Clinic [phone]

## 2018-08-24 ENCOUNTER — Telehealth: Payer: Self-pay

## 2018-08-24 LAB — PROSTATE-SPECIFIC AG, SERUM (LABCORP): Prostate Specific Ag, Serum: 31.8 ng/mL — ABNORMAL HIGH (ref 0.0–4.0)

## 2018-08-24 MED ORDER — ABIRATERONE ACETATE 250 MG PO TABS: 1000.0000 mg | ORAL_TABLET | Freq: Every day | ORAL | 0 refills | Status: DC

## 2018-08-24 NOTE — Telephone Encounter (Signed)
Oral Oncology Patient Advocate Encounter  Received notification from [place] that prior authorization for [name] is required.  PA submitted on CoverMyMeds Key [id number] Status is pending  Oral Oncology Clinic will continue to follow.  [place] Patient [place] Phone [phone] Fax [phone]

## 2018-08-24 NOTE — Telephone Encounter (Signed)
Oral Oncology Patient Advocate Encounter  Prior Authorization for [name] has been approved.    PA# [id number] Effective dates: 05/25/18 through 08/23/19  Oral Oncology Clinic will continue to follow.   [name] [name] Phone [phone] Fax [phone]

## 2018-08-27 DIAGNOSIS — Z96641 Presence of right artificial hip joint: Secondary | ICD-10-CM | POA: Diagnosis not present

## 2018-08-27 DIAGNOSIS — M19011 Primary osteoarthritis, right shoulder: Secondary | ICD-10-CM | POA: Diagnosis not present

## 2018-08-27 DIAGNOSIS — Z483 Aftercare following surgery for neoplasm: Secondary | ICD-10-CM | POA: Diagnosis not present

## 2018-08-27 DIAGNOSIS — C61 Malignant neoplasm of prostate: Secondary | ICD-10-CM | POA: Diagnosis not present

## 2018-08-27 DIAGNOSIS — C7951 Secondary malignant neoplasm of bone: Secondary | ICD-10-CM | POA: Diagnosis not present

## 2018-08-27 DIAGNOSIS — M75121 Complete rotator cuff tear or rupture of right shoulder, not specified as traumatic: Secondary | ICD-10-CM | POA: Diagnosis not present

## 2018-08-27 MED FILL — ZYTIGA 250 MG TABLET: 250 | 30 days supply | Qty: 120 | Fill #0

## 2018-08-27 MED FILL — TAMSULOSIN HCL 0.4 MG CAP: 0.4 | 90 days supply | Qty: 90 | Fill #1

## 2018-08-27 NOTE — Telephone Encounter (Signed)
Oral Oncology Patient Advocate Encounter  Confirmed with [place] that [name] was picked up today 08/27/18.  [place] Patient [place] Phone [phone] Fax [phone]

## 2018-08-29 ENCOUNTER — Ambulatory Visit: Payer: Medicare Other

## 2018-08-29 ENCOUNTER — Ambulatory Visit: Payer: Medicare Other | Admitting: Radiation Oncology

## 2018-08-29 DIAGNOSIS — C61 Malignant neoplasm of prostate: Secondary | ICD-10-CM | POA: Diagnosis not present

## 2018-08-29 DIAGNOSIS — C7951 Secondary malignant neoplasm of bone: Secondary | ICD-10-CM | POA: Diagnosis not present

## 2018-08-29 DIAGNOSIS — Z483 Aftercare following surgery for neoplasm: Secondary | ICD-10-CM | POA: Diagnosis not present

## 2018-08-29 DIAGNOSIS — M19011 Primary osteoarthritis, right shoulder: Secondary | ICD-10-CM | POA: Diagnosis not present

## 2018-08-29 DIAGNOSIS — M75121 Complete rotator cuff tear or rupture of right shoulder, not specified as traumatic: Secondary | ICD-10-CM | POA: Diagnosis not present

## 2018-08-29 DIAGNOSIS — Z96641 Presence of right artificial hip joint: Secondary | ICD-10-CM | POA: Diagnosis not present

## 2018-08-30 ENCOUNTER — Other Ambulatory Visit: Payer: Self-pay

## 2018-08-30 ENCOUNTER — Encounter: Payer: Self-pay | Admitting: Medical Oncology

## 2018-08-30 ENCOUNTER — Ambulatory Visit
Admission: RE | Admit: 2018-08-30 | Discharge: 2018-08-30 | Disposition: A | Discharge status: Home / Self Care | Payer: Medicare Other | Source: Ambulatory Visit | Attending: Radiation Oncology | Admitting: Radiation Oncology

## 2018-08-30 ENCOUNTER — Encounter: Payer: Self-pay | Admitting: Radiation Oncology

## 2018-08-30 ENCOUNTER — Ambulatory Visit
Admit: 2018-08-30 | Discharge: 2018-08-30 | Disposition: A | Discharge status: Home / Self Care | Payer: Medicare Other | Attending: Radiation Oncology | Admitting: Radiation Oncology

## 2018-08-30 VITALS — BP 157/105 | HR 80 | Temp 97.7°F | Resp 18 | Ht 71.0 in | Wt 188.2 lb

## 2018-08-30 DIAGNOSIS — C7951 Secondary malignant neoplasm of bone: Secondary | ICD-10-CM | POA: Insufficient documentation

## 2018-08-30 DIAGNOSIS — Z87891 Personal history of nicotine dependence: Secondary | ICD-10-CM | POA: Insufficient documentation

## 2018-08-30 DIAGNOSIS — Z923 Personal history of irradiation: Secondary | ICD-10-CM | POA: Insufficient documentation

## 2018-08-30 DIAGNOSIS — C61 Malignant neoplasm of prostate: Secondary | ICD-10-CM

## 2018-08-30 DIAGNOSIS — G893 Neoplasm related pain (acute) (chronic): Secondary | ICD-10-CM | POA: Diagnosis not present

## 2018-08-30 DIAGNOSIS — Z8042 Family history of malignant neoplasm of prostate: Secondary | ICD-10-CM | POA: Diagnosis not present

## 2018-08-30 DIAGNOSIS — Z888 Allergy status to other drugs, medicaments and biological substances status: Secondary | ICD-10-CM | POA: Insufficient documentation

## 2018-08-30 DIAGNOSIS — Z79899 Other long term (current) drug therapy: Secondary | ICD-10-CM | POA: Insufficient documentation

## 2018-08-30 DIAGNOSIS — E785 Hyperlipidemia, unspecified: Secondary | ICD-10-CM | POA: Insufficient documentation

## 2018-08-30 DIAGNOSIS — Z9289 Personal history of other medical treatment: Secondary | ICD-10-CM | POA: Diagnosis not present

## 2018-08-30 DIAGNOSIS — Z8546 Personal history of malignant neoplasm of prostate: Secondary | ICD-10-CM | POA: Diagnosis not present

## 2018-08-30 DIAGNOSIS — M1990 Unspecified osteoarthritis, unspecified site: Secondary | ICD-10-CM | POA: Diagnosis not present

## 2018-08-30 DIAGNOSIS — Z96643 Presence of artificial hip joint, bilateral: Secondary | ICD-10-CM | POA: Diagnosis not present

## 2018-08-30 DIAGNOSIS — Z510 Encounter for antineoplastic radiation therapy: Secondary | ICD-10-CM | POA: Insufficient documentation

## 2018-08-30 DIAGNOSIS — R9721 Rising PSA following treatment for malignant neoplasm of prostate: Secondary | ICD-10-CM | POA: Diagnosis not present

## 2018-08-30 HISTORY — DX: Malignant neoplasm of prostate: C61

## 2018-08-30 HISTORY — DX: Malignant neoplasm of bone and articular cartilage, unspecified: C41.9

## 2018-08-30 NOTE — Progress Notes (Signed)
I met [name] when he consulted with Dr. Shadad. Today he is consulting with Dr. Manning to discuss radiation to the spine. He underwent laminectomy 08/06/18 and has recovered well. He continues with PT and states his lower extremity weakness is improving. He is schedule for CT simulation today and will begin treatment 9/16.  He did receive his Zytiga and started on Tuesday 9/10.  

## 2018-08-30 NOTE — Progress Notes (Signed)
Radiation Oncology         [phone] ________________________________  Initial Outpatient Consultation  Name: [name] MRN: [id number]  Date of Service: 08/30/2018 DOB: 1941/08/13  OV:FIEPP, Zachary Jew, MD  Zachary Portela, MD   REFERRING PHYSICIAN: [name], MD  DIAGNOSIS: 77 y.o. 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 Prostate cancer [facility]) C61     HISTORY OF PRESENT ILLNESS: [name] is a 77 y.o. male seen at the request of Dr. 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  He was initially diagnosed with Gleason 5+4 adenocarcinoma of the prostate with a PSA of 13.66 in July 2016 under the care of Dr. Lawerance [name].  He elected to proceed with a 5-week course of external beam radiotherapy followed by brachytherapy boost at [facility] with Dr. Tharon [name] between 11/05/15 - 12/2015 in combination with 2 years of androgen deprivation therapy.  PSA nadired at 1.0 in 07/2016. 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  Metastatic work-up at that time showed bone involvement as well as lymphadenopathy.  He was treated under the care of Dr. Thera [name] at [facility], initially with enzalutamide, which was started in May 2018 and discontinued in January 2019 when he presented with progression of disease with increased bony metastasis, adrenal nodule and worsening lymphadenopathy.  At that time, his treatment was changed to Taxotere chemotherapy with his last treatment in July 2019.  Repeat systemic imaging study showed a partial response with slight improvement in the bulky retroperitoneal lymphadenopathy but worsening bony metastasis based on a lumbar spine MRI obtained on June 24, 2018.  The MRI showed a complete replacement of L3 vertebral body with tumor in the ventral epidural space behind  L3, greater to the left.  His last PSA on July 18, 2018 was 21.57 which has increased from 19.8 in July 3 and 14.98 on May 30, 2018.  He has recently elected to transfer his care to Dr. [name] at the [facility] since this is closer to home. He has continued on androgen deprivation therapy with Lupron under the care of Dr. Rosana [name] with his last 27-month injection given in February 2019.  He has also been receiving denosumab every 6 weeks with last treatment on 07/18/2018.  He had a repeat CT C/A/P on 07/23/2018 which demonstrated persistent lymphadenopathy and no significant progression of his bony disease as compared to imaging from July 2019.  There has been interval progression of the adrenal metastasis.  At the time of consult visit with Dr. [name] on 07/24/2018 his main complaint was left lower back pain radiating down the left leg.  He was able to ambulate with the aid of a cane but the pain was beginning to significantly impact his ability to remain active and continue to participate in his normal ADLs.  He was taking Percocet 1 to 2 tablets 3 times a day for pain relief and denied any focal weakness, numbness, tingling, or loss of bowel or bladder control.  The recommendation at that time was to proceed with surgical decompression to treat his L3 metastasis as well as obtain additional tissue for further molecular studies to help identify any potential future systemic treatment options.  He will continue on androgen deprivation therapy indefinitely under the care of Dr. Rosana [name] (last injection was in  07/2018), [name] every 2 months (last given 08/23/2018), and Zytiga has been added to his treatment regimen.  He states that he started his Zytiga treatment on 08/28/2018.  He elected to proceed with surgical decompressive laminectomy on the left at L3 with complete removal of the left L3 lamina, microscopic foraminotomies of the left L3 and L4 nerve roots and microscopic removal of epidural tumor on the  left under the care of Dr. Saintclair [name] on 08/06/2018.  Final surgical pathology confirmed high-grade metastatic prostate adenocarcinoma.  He has recovered well from this procedure and has been referred today to discuss the potential role of adjuvant postoperative radiotherapy to L3/L4. He states that he is on prednisone 5 mg bid from being on chemotherapy.   PREVIOUS RADIATION THERAPY: Yes  11/05/2015 - 12/10/2015: 45 Gy in 25 fractions to the pelvis followed by LDR boost, 110 Gy on 12/23/2015 at [facility] c/o Dr. [name].  PAST MEDICAL HISTORY:  Past Medical History:  Diagnosis Date  . Arthritis   . Barrett's esophagus   . Bone cancer ([place])   . GERD (gastroesophageal reflux disease)    takes Protonix daily  . History of colon polyps   . Hyperlipidemia    takes Zetia and Welchol daily  . Joint pain   . Muscle spasm    takes Robaxin daily as needed  . Prostate cancer ([place])       PAST SURGICAL HISTORY: Past Surgical History:  Procedure Laterality Date  . COLONOSCOPY    . ESOPHAGOGASTRODUODENOSCOPY    . KNEE ARTHROSCOPY Right 2011  . LUMBAR LAMINECTOMY/DECOMPRESSION MICRODISCECTOMY Left 08/06/2018   Procedure: Laminectomy and Foraminotomy - Lumbar Three-Four- left;  Surgeon: Zachary Kos, MD;  Location: [place];  Service: Neurosurgery;  Laterality: Left;  left  . TOTAL HIP ARTHROPLASTY Left 05/07/2013   Procedure: LEFT TOTAL HIP ARTHROPLASTY ANTERIOR APPROACH;  Surgeon: Zachary Rossetti, MD;  Location: [place];  Service: Orthopedics;  Laterality: Left;  . TOTAL HIP ARTHROPLASTY Right   . TOTAL HIP REVISION Right 02/02/2015   Procedure: TOTAL HIP REVISION;  Surgeon: Zachary Salen, MD;  Location: [place];  Service: Orthopedics;  Laterality: Right;    FAMILY HISTORY:  Family History  Problem Relation Age of Onset  . Colon cancer Mother   . Colon cancer Brother   . Prostate cancer Brother     SOCIAL HISTORY:  Social History   Socioeconomic History  . Marital status: Married    Spouse  name: Not on file  . Number of children: 3  . Years of education: Not on file  . Highest education level: Not on file  Occupational History  . Not on file  Social Needs  . Financial resource strain: Not on file  . Food insecurity:    Worry: Not on file    Inability: Not on file  . Transportation needs:    Medical: Not on file    Non-medical: Not on file  Tobacco Use  . Smoking status: Former Smoker    Packs/day: 0.25    Years: 2.00    Pack years: 0.50    Last attempt to quit: 12/19/1968    Years since quitting: 49.7  . Smokeless tobacco: Former Systems developer  . Tobacco comment: quit smoking 56yrs ago  Substance and Sexual Activity  . Alcohol use: No  . Drug use: No  . Sexual activity: Not Currently  Lifestyle  . Physical activity:    Days per week: Not on file    Minutes per session:  Not on file  . Stress: Not on file  Relationships  . Social connections:    Talks on phone: Not on file    Gets together: Not on file    Attends religious service: Not on file    Active member of club or organization: Not on file    Attends meetings of clubs or organizations: Not on file    Relationship status: Not on file  . Intimate partner violence:    Fear of current or ex partner: Not on file    Emotionally abused: Not on file    Physically abused: Not on file    Forced sexual activity: Not on file  Other Topics Concern  . Not on file  Social History Narrative  . Not on file    ALLERGIES: Statins  MEDICATIONS:  Current Outpatient Medications  Medication Sig Dispense Refill  . abiraterone acetate (ZYTIGA) 250 MG tablet Take 4 tablets (1,000 mg total) by mouth daily. Take on an empty stomach 1 hour before or 2 hours after a meal 120 tablet 0  . Calcium Carb-Cholecalciferol (CALCIUM 1000 + D PO) Take 30 mLs by mouth daily. Calcium 1000 mg / Vit D 1000 iu    . cycloSPORINE (RESTASIS) 0.05 % ophthalmic emulsion Place 1 drop into both eyes 2 (two) times daily.    Marland Kitchen docusate sodium (COLACE)  100 MG capsule Take 100 mg by mouth 2 (two) times daily.    Marland Kitchen ezetimibe (ZETIA) 10 MG tablet Take 10 mg by mouth every morning.     Marland Kitchen ibuprofen (ADVIL,MOTRIN) 200 MG tablet Take 200 mg by mouth every 6 (six) hours as needed for moderate pain.    Marland Kitchen leuprolide (LUPRON) 30 MG injection Inject 45 mg into the muscle every 6 (six) months.    . ondansetron (ZOFRAN) 4 MG tablet Take 4 mg by mouth every 8 (eight) hours as needed for nausea or vomiting.    . pantoprazole (PROTONIX) 40 MG tablet Take 40 mg by mouth daily after supper.     . polyethylene glycol (MIRALAX / GLYCOLAX) packet Take 17 g by mouth daily as needed for mild constipation. 14 each 0  . predniSONE (DELTASONE) 5 MG tablet     . tamsulosin (FLOMAX) 0.4 MG CAPS capsule Take 0.4 mg by mouth daily.     Marland Kitchen triamcinolone cream (KENALOG) 0.1 % Apply 1 application topically daily as needed (dry skin in the winter).     Marland Kitchen dexamethasone (DECADRON) 4 MG tablet Take 1 tablet (4 mg total) by mouth 2 (two) times daily. 60 tablet 0  . oxyCODONE (OXY IR/ROXICODONE) 5 MG immediate release tablet Take 1-2 tablets (5-10 mg total) by mouth every 4 (four) hours as needed for severe pain. 90 tablet 0  . traMADol (ULTRAM) 50 MG tablet Take 1 tablet (50 mg total) by mouth every 6 (six) hours as needed for severe pain. 20 tablet 0   No current facility-administered medications for this encounter.     REVIEW OF SYSTEMS:  On review of systems, the patient reports that he is doing okay overall, healing from surgery. He denies any chest pain, shortness of breath, cough, fevers, chills, night sweats, or unintended weight changes. He reports stable fatigue and has trouble sleeping due to prednisone. He denies any bowel or bladder disturbances, and denies abdominal pain, nausea or vomiting. He reports pain in his lower back and legs that is making it difficult to walk any distance, however he states that his back pain has  improved since surgery. He uses a wheelchair for  long distances but is able to use a cane to move around the house. He denies pain when sitting. He reports pain with ambulation and standing. He describes the pain as soreness that goes down his legs. He states that he is taking Tramadol rarely but does take Ibuprofen twice a day to manage pain. He denies numbness or tingling. He reports his lower extremity weakness is improving with physical therapy twice a week. He reports he has the most difficulty standing from a sitting position  A complete review of systems is obtained and is otherwise negative.    PHYSICAL EXAM:  Wt Readings from Last 3 Encounters:  08/30/18 188 lb 4 oz (85.4 kg)  08/23/18 189 lb (85.7 kg)  08/16/18 189 lb 14.1 oz (86.1 kg)   Temp Readings from Last 3 Encounters:  09/03/18 98 F (36.7 C) (Oral)  08/30/18 97.7 F (36.5 C) (Oral)  08/23/18 98.2 F (36.8 C) (Oral)   BP Readings from Last 3 Encounters:  09/03/18 (!) 159/96  08/30/18 (!) 157/105  08/23/18 (!) 146/74   Pulse Readings from Last 3 Encounters:  09/03/18 (!) 102  08/30/18 80  08/23/18 83    /10  In general this is a well appearing caucasian male presenting in wheelchair today. He is in no acute distress. He's alert and oriented x4 and appropriate throughout the examination. Cardiopulmonary assessment is negative for acute distress and he exhibits normal effort. Sensation intact to light touch bilaterally in the lower extremities, and strength is 5/5 and equal bilaterally in the lower extremities. Incision over midline low back appears well-healed without signs of infection, non-tender to palpation and no induration.   KPS = 80  100 - Normal; no complaints; no evidence of disease. 90   - Able to carry on normal activity; minor signs or symptoms of disease. 80   - Normal activity with effort; some signs or symptoms of disease. 60   - Cares for self; unable to carry on normal activity or to do active work. 60   - Requires occasional assistance, but is  able to care for most of his personal needs. 50   - Requires considerable assistance and frequent medical care. 63   - Disabled; requires special care and assistance. 79   - Severely disabled; hospital admission is indicated although death not imminent. 63   - Very sick; hospital admission necessary; active supportive treatment necessary. 10   - Moribund; fatal processes progressing rapidly. 0     - Dead  Karnofsky DA, Abelmann Eagle, Craver LS and Burchenal JH [phone]) The use of the nitrogen mustards in the palliative treatment of carcinoma: with particular reference to bronchogenic carcinoma Cancer 1 634-56  LABORATORY DATA:  Lab Results  Component Value Date   WBC 8.1 08/23/2018   HGB 13.1 08/23/2018   HCT 39.8 08/23/2018   MCV 94.8 08/23/2018   PLT 325 08/23/2018   Lab Results  Component Value Date   NA 140 08/23/2018   K 4.6 08/23/2018   CL 107 08/23/2018   CO2 25 08/23/2018   Lab Results  Component Value Date   ALT 11 08/23/2018   AST 12 (L) 08/23/2018   ALKPHOS 83 08/23/2018   BILITOT 0.3 08/23/2018     RADIOGRAPHY: Dg Chest 2 View  Result Date: 08/16/2018 CLINICAL DATA:  Mental status change. EXAM: CHEST - 2 VIEW COMPARISON:  Radiographs of January 22, 2015. CT scan of July 23, 2018. FINDINGS:  The heart size and mediastinal contours are within normal limits. Both lungs are clear. No pneumothorax or pleural effusion is noted. Narrowing of right subacromial space is noted consistent with chronic rotator cuff injury. IMPRESSION: No active cardiopulmonary disease. Electronically Signed   By: Marijo Conception, M.D.   On: 08/16/2018 10:34   Ct Head Wo Contrast  Result Date: 08/16/2018 CLINICAL DATA:  77 year old male with a history hallucinations EXAM: CT HEAD WITHOUT CONTRAST TECHNIQUE: Contiguous axial images were obtained from the base of the skull through the vertex without intravenous contrast. COMPARISON:  09/09/2010 FINDINGS: Brain: No acute intracranial hemorrhage. No  midline shift or mass effect. Gray-white differentiation maintained. Unremarkable appearance of the ventricular system. Vascular: Unremarkable. Skull: No acute fracture.  No aggressive bone lesion identified. Sinuses/Orbits: Unremarkable appearance of the orbits. Mastoid air cells clear. No middle ear effusion. No significant sinus disease. Other: None IMPRESSION: Negative head CT for acute intracranial abnormality Electronically Signed   By: Corrie Mckusick D.O.   On: 08/16/2018 10:47   Dg Hip Unilat With Pelvis 2-3 Views Left  Result Date: 08/16/2018 CLINICAL DATA:  Acute LEFT hip pain for 2 weeks following lumbar surgery. History bilateral hip replacements. EXAM: DG HIP (WITH OR WITHOUT PELVIS) 2-3V LEFT COMPARISON:  06/13/2018 FINDINGS: Bilateral total hip arthroplasties noted. No acute fracture or dislocation. Degenerative changes in the LOWER lumbar spine are again noted. Prostate seeds are present. No suspicious bony lesions are identified. IMPRESSION: No evidence of acute abnormality. Bilateral hip arthroplasties without complicating features. Electronically Signed   By: Margarette Canada M.D.   On: 08/16/2018 18:28      IMPRESSION/PLAN: 1. 77 y.o. 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 Today, we talked to the patient and his wife about the findings and workup thus far. 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 We discussed the available radiation techniques, and focused on the details of logistics and delivery.  The recommendation is to proceed with a course of 10 daily treatments delivered to the level of L2-L4 over a 2-week time period.  We reviewed the anticipated acute and late sequelae associated with radiation in this setting. The patient was encouraged to ask questions that  were answered to his satisfaction.  He appears to have a good understanding of his disease and our treatment recommendations with the goal of palliating his pain and preventing further progression of disease at the level of L3.  At the conclusion of our conversation, the patient elects to proceed with palliative radiotherapy to the spine. We will share our discussion with Dr. [name] and move forward with treatment planning accordingly.  The patient will undergo CT simulation today in anticipation of beginning treatments early next week.  He freely signed written consent to proceed today in the office and a copy was placed in his medical record.  He knows to call at anytime with any questions or concerns in the interim.   Nicholos Johns, PA-C    Tyler Pita, MD  [facility] Direct Dial: [phone]  Fax: [phone] [email or web address]  Skype  LinkedIn  This document serves as a record of services personally performed by Tyler Pita, MD and Freeman Caldron, PA-C. It was created on their behalf by Rae Lips, a trained medical scribe. The creation of this record is based on the scribe's personal observations  and the providers' statements to them. This document has been checked and approved by the attending providers.

## 2018-08-30 NOTE — Progress Notes (Addendum)
Histology and Location of Primary Cancer: castration resistant prostate cancer diagnosed in 2016.   Sites of Visceral and Bony Metastatic Disease: bony mets, lymphadenopathy, adrenal nodules  Location(s) of Symptomatic Metastases: L3  Past/Anticipated chemotherapy by medical oncology, if any: He was treated under the care of Dr. [name] at [facility] initially with enzalutamide started in May 2018 until January 2019 where he presented with progression of disease with bony metastasis, adrenal nodule as well as worsening adenopathy. At that time he was treated with Taxotere chemotherapy initially at 60 mg/m and that was reduced to 45 mg/m after cycle 6. His last Lupron was given and on January 22, 2018 under the care of Dr. [name] and has received 45 mg at that time. And he has also been receiving denosumab every 6 weeks with last treatment was on 07/18/2018.   Pain on a scale of 0-10 is: Reports pain in his lower back and legs making it difficult to walk any distance. He is able to use a cane to move around the house. Denies pain when sitting. Reports pain with ambulation and standing. Describes the pain as soreness that goes down his legs. Reports taking Tramadol rarely but does take Ibuprofen twice a day to manage pain.    If Spine Met(s), symptoms, if any, include:  Bowel/Bladder retention or incontinence (please describe): Denies .  Numbness or weakness in extremities (please describe): Denies numbness. No edema of lower extremities noted. Reports weakness is improving with physical therapy twice a week. Reports he has the most difficulty standing from a sitting position  Current Decadron regimen, if applicable: Prednisone 5 mg bid.  Ambulatory status? Walker? Wheelchair?: Ambulatory with aid of cane.  SAFETY ISSUES:  Prior radiation? Yes, external beam and brachytherapy in 2017 at [facility]  Pacemaker/ICD? no  Possible current pregnancy? no  Is the  patient on methotrexate? no  Current Complaints / other details:  77 year old male. Referred for consideration of postoperative radiation to L3 or Xofigo??? Reports he sleeps in a recliner because its painful to lay flat on his back.

## 2018-09-03 ENCOUNTER — Ambulatory Visit
Admission: RE | Admit: 2018-09-03 | Discharge: 2018-09-03 | Disposition: A | Discharge status: Home / Self Care | Payer: Medicare Other | Source: Ambulatory Visit | Attending: Radiation Oncology | Admitting: Radiation Oncology

## 2018-09-03 ENCOUNTER — Telehealth: Payer: Self-pay | Admitting: Radiation Oncology

## 2018-09-03 ENCOUNTER — Telehealth: Payer: Self-pay

## 2018-09-03 ENCOUNTER — Other Ambulatory Visit: Payer: Self-pay | Admitting: Radiation Oncology

## 2018-09-03 ENCOUNTER — Other Ambulatory Visit: Payer: Self-pay

## 2018-09-03 VITALS — BP 159/96 | HR 102 | Temp 98.0°F | Resp 20

## 2018-09-03 DIAGNOSIS — Z483 Aftercare following surgery for neoplasm: Secondary | ICD-10-CM | POA: Diagnosis not present

## 2018-09-03 DIAGNOSIS — M19011 Primary osteoarthritis, right shoulder: Secondary | ICD-10-CM | POA: Diagnosis not present

## 2018-09-03 DIAGNOSIS — C7951 Secondary malignant neoplasm of bone: Secondary | ICD-10-CM | POA: Diagnosis not present

## 2018-09-03 DIAGNOSIS — Z96641 Presence of right artificial hip joint: Secondary | ICD-10-CM | POA: Diagnosis not present

## 2018-09-03 DIAGNOSIS — C7949 Secondary malignant neoplasm of other parts of nervous system: Secondary | ICD-10-CM

## 2018-09-03 DIAGNOSIS — Z510 Encounter for antineoplastic radiation therapy: Secondary | ICD-10-CM | POA: Diagnosis not present

## 2018-09-03 DIAGNOSIS — M75121 Complete rotator cuff tear or rupture of right shoulder, not specified as traumatic: Secondary | ICD-10-CM | POA: Diagnosis not present

## 2018-09-03 DIAGNOSIS — C61 Malignant neoplasm of prostate: Secondary | ICD-10-CM

## 2018-09-03 MED ORDER — MORPHINE SULFATE 4 MG/ML IJ SOLN
2.0000 mg | Freq: Once | INTRAMUSCULAR | Status: AC
  Administered 2018-09-03: 2 mg via INTRAMUSCULAR
  Filled 2018-09-03: qty 1

## 2018-09-03 MED ORDER — MORPHINE SULFATE 4 MG/ML IJ SOLN: 2.0000 mg | Freq: Once | INTRAMUSCULAR | Status: DC

## 2018-09-03 MED ORDER — OXYCODONE HCL 5 MG PO TABS: 2.5000 mg | ORAL_TABLET | ORAL | 0 refills | Status: DC | PRN

## 2018-09-03 MED ORDER — TRAMADOL HCL 50 MG PO TABS: 50.0000 mg | ORAL_TABLET | Freq: Four times a day (QID) | ORAL | 0 refills | Status: DC | PRN

## 2018-09-03 NOTE — Telephone Encounter (Signed)
Spoke with patient spouse and made aware that prescription for refill of the Tramadol is ready for pick-up. No further questions.

## 2018-09-03 NOTE — Progress Notes (Signed)
Phoned patient's wife. Explained that per Shona Simpson, PA-C her husband is fine to receive a flu shot on Wednesday and continue PT twice per week. She verbalized understanding.

## 2018-09-03 NOTE — Telephone Encounter (Signed)
-----   Message from Hayden Pedro, PA-C sent at 09/03/2018  3:56 PM EDT ----- Regarding: RE: Flud Shot and PT twice per week Yes to flu shot, yes to pt ----- Message ----- From: Heywood Footman, RN Sent: 09/03/2018   3:25 PM EDT To: Tyler Pita, MD, Freeman Caldron, PA-C, # Subject: Flud Shot and PT twice per week                77 year old male.  To proceed with a course of 10 daily treatments delivered to the level of L2-L4 over a 2-week time period.  Port and tx today. Unable to lay flat for treatment. Morphine administered. Still unable to lay flat for treatment. Oxycodone prescribed patient plans to try again tomorrow. Patient questions if he may get a FLU shot on Wednesday. Also, patient questions if it is safe for him to do in home PT twice per week.  Sam

## 2018-09-03 NOTE — Progress Notes (Signed)
Received patient in the clinic. Treatment staff report the patient is in too much pain to lay flat on the table for treatment. Patient reports taking Tramadol 2 tabs and Motrin 4 tablets at 1410. Patient tearfully reports lumbar spine pain 10 on a scale of 0-10. Administered morphine 2 mg IM as directed by Shona Simpson, PA-C. Patient tolerated this well. Bandaid placed on injection site. Patient still unable to lay on table flat for treatment. Treatment for today cancelled. Oxycodone escribed. Patient and wife understand to pick this up on their way home. Also, they understand to take two tablets 30 minutes prior to treatment tomorrow.

## 2018-09-04 ENCOUNTER — Telehealth: Payer: Self-pay | Admitting: *Deleted

## 2018-09-04 ENCOUNTER — Telehealth: Payer: Self-pay | Admitting: Radiation Oncology

## 2018-09-04 ENCOUNTER — Ambulatory Visit: Payer: Medicare Other

## 2018-09-04 ENCOUNTER — Ambulatory Visit
Admission: RE | Admit: 2018-09-04 | Discharge: 2018-09-04 | Disposition: A | Discharge status: Home / Self Care | Payer: Medicare Other | Source: Ambulatory Visit | Attending: Radiation Oncology | Admitting: Radiation Oncology

## 2018-09-04 ENCOUNTER — Other Ambulatory Visit: Payer: Self-pay | Admitting: Radiation Oncology

## 2018-09-04 DIAGNOSIS — C61 Malignant neoplasm of prostate: Secondary | ICD-10-CM | POA: Diagnosis not present

## 2018-09-04 DIAGNOSIS — C7951 Secondary malignant neoplasm of bone: Secondary | ICD-10-CM | POA: Diagnosis not present

## 2018-09-04 DIAGNOSIS — Z510 Encounter for antineoplastic radiation therapy: Secondary | ICD-10-CM | POA: Diagnosis not present

## 2018-09-04 MED ORDER — DEXAMETHASONE 4 MG PO TABS: 4.0000 mg | ORAL_TABLET | Freq: Two times a day (BID) | ORAL | 0 refills | Status: DC

## 2018-09-04 NOTE — Progress Notes (Signed)
See prior nursing notes. Pt has metastatic castrate resistant prostate cancer and epidural involvement seen on prior MRI Lspine. He was to receive his first fraction of XRT yesterday but could not tolerate laying flat due to pain. Morphine was given and rx for oxycodone. Overnight pain has become concerning for neurologic changes and d/w Dr. [name]. We will hold prednisone, and start Dexamethasone 4 mg BID until he completes radiotherapy then taper, and pt will be instructed to continue oxycodone q 4-6 hours as prescribed.

## 2018-09-04 NOTE — Progress Notes (Signed)
Received voicemail from patient's wife concerned about giving her husband decadron 4 mg since he took prednisone 5 mg around 0700 this morning. Explained that per Shona Simpson, PA-C it is safe for her husband to take decadron now to preserve his spinal cord. Stressed not to take prednisone any longer but instead decadron 4 mg bid. She verbalized understanding and expressed appreciation for the call back. Also, she reports a reduction in her husbands pain with two oxycodone tablets instead of one.

## 2018-09-04 NOTE — Progress Notes (Signed)
Received call from patient's wife this morning. She is very concerned about her husband. She reports calling the machine and cancelling his radiation treatment for today. Also, she reports calling Dr. Hazeline Junker office and leaving a message requesting assistance. She states, "his pain is different and a lot worse in his legs." She explains he was up all night in pain. She reports that she gave him one tablet of oxycodone at 2200 then another at 0200 without any relief. Informed Shona Simpson of these findings. After discussion with Bryson Ha this RN phoned the patient's wife back with directions. Instructed her to have her husband take oxycodone two tablets every four hours until his pain is controlled. Went onto explain that once his pain is better controlled he may stretch it to every six hours. Instructed her to have him stop taking Prednisone 5 mg bid and begin taking decadron 4 mg bid asap. Explained decadron script has been escribed to their preferred pharmacy already. Stressed that her husband should continue taking his protonix for increased GERD related to steroid use and colace to avoid constipation associated with pain medication. Finally, explained that her husband should present for radiation today and the rest of the week to prevent paralysis. Wife verbalized understanding of all reviewed and intention to have her husband her for treatment. Informed Melissa, RT on L2 of patient's intention to present for treatment.

## 2018-09-04 NOTE — Telephone Encounter (Signed)
Returned wife's phone call regarding medications for pain and steroids. She stated,"I gave him his cancer pill (Zytiga) and his Prednisone this morning. I forgot I wasn't suppose to give it to him. I need to give him Decadron and his Oxycodone too. What do I need to do? He can't lie on the table to receive radiation because his back hurts to bad."  Per Mikey Bussing, NP, I instructed her to give the Decadron and Oxycodone this morning, and to stop the Prednisone. No Prednisone while he is taking the Decadron. She verbalized understanding.

## 2018-09-05 ENCOUNTER — Ambulatory Visit
Admission: RE | Admit: 2018-09-05 | Discharge: 2018-09-05 | Disposition: A | Discharge status: Home / Self Care | Payer: Medicare Other | Source: Ambulatory Visit | Attending: Radiation Oncology | Admitting: Radiation Oncology

## 2018-09-05 DIAGNOSIS — Z510 Encounter for antineoplastic radiation therapy: Secondary | ICD-10-CM | POA: Diagnosis not present

## 2018-09-05 DIAGNOSIS — C61 Malignant neoplasm of prostate: Secondary | ICD-10-CM | POA: Diagnosis not present

## 2018-09-06 ENCOUNTER — Ambulatory Visit
Admission: RE | Admit: 2018-09-06 | Discharge: 2018-09-06 | Disposition: A | Discharge status: Home / Self Care | Payer: Medicare Other | Source: Ambulatory Visit | Attending: Radiation Oncology | Admitting: Radiation Oncology

## 2018-09-06 DIAGNOSIS — M75121 Complete rotator cuff tear or rupture of right shoulder, not specified as traumatic: Secondary | ICD-10-CM | POA: Diagnosis not present

## 2018-09-06 DIAGNOSIS — C61 Malignant neoplasm of prostate: Secondary | ICD-10-CM | POA: Diagnosis not present

## 2018-09-06 DIAGNOSIS — Z96641 Presence of right artificial hip joint: Secondary | ICD-10-CM | POA: Diagnosis not present

## 2018-09-06 DIAGNOSIS — Z510 Encounter for antineoplastic radiation therapy: Secondary | ICD-10-CM | POA: Diagnosis not present

## 2018-09-06 DIAGNOSIS — Z483 Aftercare following surgery for neoplasm: Secondary | ICD-10-CM | POA: Diagnosis not present

## 2018-09-06 DIAGNOSIS — C7951 Secondary malignant neoplasm of bone: Secondary | ICD-10-CM | POA: Diagnosis not present

## 2018-09-06 DIAGNOSIS — M19011 Primary osteoarthritis, right shoulder: Secondary | ICD-10-CM | POA: Diagnosis not present

## 2018-09-07 ENCOUNTER — Ambulatory Visit
Admission: RE | Admit: 2018-09-07 | Discharge: 2018-09-07 | Disposition: A | Discharge status: Home / Self Care | Payer: Medicare Other | Source: Ambulatory Visit | Attending: Radiation Oncology | Admitting: Radiation Oncology

## 2018-09-07 ENCOUNTER — Other Ambulatory Visit: Payer: Self-pay | Admitting: Radiation Oncology

## 2018-09-07 DIAGNOSIS — C61 Malignant neoplasm of prostate: Secondary | ICD-10-CM | POA: Diagnosis not present

## 2018-09-07 DIAGNOSIS — Z510 Encounter for antineoplastic radiation therapy: Secondary | ICD-10-CM | POA: Diagnosis not present

## 2018-09-07 MED ORDER — OXYCODONE HCL 5 MG PO TABS: 5.0000 mg | ORAL_TABLET | ORAL | 0 refills | Status: DC | PRN

## 2018-09-09 NOTE — Progress Notes (Signed)
  Radiation Oncology         [phone] ________________________________  Name: ISAACK PREBLE MRN: [id number]  Date: 08/30/2018  DOB: 04-24-1941  SIMULATION AND TREATMENT PLANNING NOTE    ICD-10-CM   1. Prostate cancer [facility]) C61     DIAGNOSIS:  77 y.o. 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  Identity was confirmed.  All relevant records and images related to the planned course of therapy were reviewed.  The patient freely provided informed written consent to proceed with treatment after reviewing the details related to the planned course of therapy. The consent form was witnessed and verified by the simulation staff.  Then, the patient was set-up in a stable reproducible  supine position for radiation therapy.  CT images were obtained.  Surface markings were placed.  The CT images were loaded into the planning software.  Then the target and avoidance structures were contoured including kidneys.  Treatment planning then occurred.  The radiation prescription was entered and confirmed.  Then, I designed and supervised the construction of a total of 3 medically necessary complex treatment devices with VacLoc positioner and 2 MLCs to shield kidneys.  I have requested : 3D Simulation  I have requested a DVH of the following structures: Left Kidney, Right Kidney and target.  PLAN:  The patient will receive 30 Gy in 10 fractions.  ________________________________  [name], M.D.

## 2018-09-10 ENCOUNTER — Telehealth: Payer: Self-pay | Admitting: Radiation Oncology

## 2018-09-10 ENCOUNTER — Encounter (HOSPITAL_COMMUNITY): Payer: Self-pay | Admitting: Oncology

## 2018-09-10 ENCOUNTER — Ambulatory Visit
Admission: RE | Admit: 2018-09-10 | Discharge: 2018-09-10 | Disposition: A | Discharge status: Home / Self Care | Payer: Medicare Other | Source: Ambulatory Visit | Attending: Radiation Oncology | Admitting: Radiation Oncology

## 2018-09-10 DIAGNOSIS — C61 Malignant neoplasm of prostate: Secondary | ICD-10-CM | POA: Diagnosis not present

## 2018-09-10 DIAGNOSIS — Z510 Encounter for antineoplastic radiation therapy: Secondary | ICD-10-CM | POA: Diagnosis not present

## 2018-09-10 DIAGNOSIS — C7951 Secondary malignant neoplasm of bone: Secondary | ICD-10-CM | POA: Diagnosis not present

## 2018-09-10 NOTE — Progress Notes (Signed)
Received voicemail message from [place] @ [facility] on [address] requesting authorization to fill oxycodone 5 mg qty 90 given by Dr. Tammi Klippel on 9/20. Cristie Hem reported that insurance questioned script since [place] gave Tramadol on 9/9, Bryson Ha gave Oxy 5 mg on 9/16, and Eulogio Bear gave Tramadol on 8/30. 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

## 2018-09-11 ENCOUNTER — Ambulatory Visit
Admission: RE | Admit: 2018-09-11 | Discharge: 2018-09-11 | Disposition: A | Discharge status: Home / Self Care | Payer: Medicare Other | Source: Ambulatory Visit | Attending: Radiation Oncology | Admitting: Radiation Oncology

## 2018-09-11 DIAGNOSIS — C61 Malignant neoplasm of prostate: Secondary | ICD-10-CM | POA: Diagnosis not present

## 2018-09-11 DIAGNOSIS — Z510 Encounter for antineoplastic radiation therapy: Secondary | ICD-10-CM | POA: Diagnosis not present

## 2018-09-12 ENCOUNTER — Ambulatory Visit
Admission: RE | Admit: 2018-09-12 | Discharge: 2018-09-12 | Disposition: A | Discharge status: Home / Self Care | Payer: Medicare Other | Source: Ambulatory Visit | Attending: Radiation Oncology | Admitting: Radiation Oncology

## 2018-09-12 DIAGNOSIS — Z510 Encounter for antineoplastic radiation therapy: Secondary | ICD-10-CM | POA: Diagnosis not present

## 2018-09-12 DIAGNOSIS — C61 Malignant neoplasm of prostate: Secondary | ICD-10-CM | POA: Diagnosis not present

## 2018-09-13 ENCOUNTER — Ambulatory Visit
Admission: RE | Admit: 2018-09-13 | Discharge: 2018-09-13 | Disposition: A | Discharge status: Home / Self Care | Payer: Medicare Other | Source: Ambulatory Visit | Attending: Radiation Oncology | Admitting: Radiation Oncology

## 2018-09-13 DIAGNOSIS — C61 Malignant neoplasm of prostate: Secondary | ICD-10-CM | POA: Diagnosis not present

## 2018-09-13 DIAGNOSIS — Z510 Encounter for antineoplastic radiation therapy: Secondary | ICD-10-CM | POA: Diagnosis not present

## 2018-09-14 ENCOUNTER — Ambulatory Visit
Admission: RE | Admit: 2018-09-14 | Discharge: 2018-09-14 | Disposition: A | Discharge status: Home / Self Care | Payer: Medicare Other | Source: Ambulatory Visit | Attending: Radiation Oncology | Admitting: Radiation Oncology

## 2018-09-14 ENCOUNTER — Ambulatory Visit: Payer: Medicare Other

## 2018-09-14 DIAGNOSIS — M19011 Primary osteoarthritis, right shoulder: Secondary | ICD-10-CM | POA: Diagnosis not present

## 2018-09-14 DIAGNOSIS — C7951 Secondary malignant neoplasm of bone: Secondary | ICD-10-CM | POA: Diagnosis not present

## 2018-09-14 DIAGNOSIS — Z483 Aftercare following surgery for neoplasm: Secondary | ICD-10-CM | POA: Diagnosis not present

## 2018-09-14 DIAGNOSIS — M75121 Complete rotator cuff tear or rupture of right shoulder, not specified as traumatic: Secondary | ICD-10-CM | POA: Diagnosis not present

## 2018-09-14 DIAGNOSIS — C61 Malignant neoplasm of prostate: Secondary | ICD-10-CM | POA: Diagnosis not present

## 2018-09-14 DIAGNOSIS — Z96641 Presence of right artificial hip joint: Secondary | ICD-10-CM | POA: Diagnosis not present

## 2018-09-14 DIAGNOSIS — Z510 Encounter for antineoplastic radiation therapy: Secondary | ICD-10-CM | POA: Diagnosis not present

## 2018-09-17 ENCOUNTER — Ambulatory Visit: Payer: Medicare Other

## 2018-09-17 ENCOUNTER — Ambulatory Visit
Admission: RE | Admit: 2018-09-17 | Discharge: 2018-09-17 | Disposition: A | Discharge status: Home / Self Care | Payer: Medicare Other | Source: Ambulatory Visit | Attending: Radiation Oncology | Admitting: Radiation Oncology

## 2018-09-17 ENCOUNTER — Encounter: Payer: Self-pay | Admitting: Radiation Oncology

## 2018-09-17 DIAGNOSIS — C61 Malignant neoplasm of prostate: Secondary | ICD-10-CM | POA: Diagnosis not present

## 2018-09-17 DIAGNOSIS — Z510 Encounter for antineoplastic radiation therapy: Secondary | ICD-10-CM | POA: Diagnosis not present

## 2018-09-17 DIAGNOSIS — C7951 Secondary malignant neoplasm of bone: Secondary | ICD-10-CM | POA: Diagnosis not present

## 2018-09-18 ENCOUNTER — Other Ambulatory Visit: Payer: Self-pay | Admitting: Oncology

## 2018-09-18 ENCOUNTER — Ambulatory Visit: Payer: Medicare Other

## 2018-09-18 DIAGNOSIS — C61 Malignant neoplasm of prostate: Secondary | ICD-10-CM

## 2018-09-20 MED FILL — ZYTIGA 250 MG TABLET: 250 | 30 days supply | Qty: 120 | Fill #0

## 2018-09-20 NOTE — Progress Notes (Signed)
  Radiation Oncology         [phone] ________________________________  Name: IANN RODIER MRN: [id number]  Date: 09/17/2018  DOB: [birth year]  End of Treatment Note  Diagnosis:   77 y.o. 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  Beams/energy:   3D / 15X Photon  Narrative: The patient tolerated radiation treatment relatively well.  He experienced moderate fatigue and some constipation during treatment, but overall did well and characterized his lower back pain as mild.  Plan: The patient has completed radiation treatment. The patient will return to radiation oncology clinic for routine followup in one month. I advised him to call or return sooner if he has any questions or concerns related to his recovery or treatment. ________________________________  Sheral Apley. Tammi Klippel, M.D.  This document serves as a record of services personally performed by Tyler Pita, MD. It was created on his behalf by Rae Lips, a trained medical scribe. The creation of this record is based on the scribe's personal observations and the provider's statements to them. This document has been checked and approved by the attending provider.

## 2018-09-25 ENCOUNTER — Other Ambulatory Visit: Payer: Self-pay | Admitting: Oncology

## 2018-09-25 DIAGNOSIS — C61 Malignant neoplasm of prostate: Secondary | ICD-10-CM | POA: Diagnosis not present

## 2018-09-25 DIAGNOSIS — M5440 Lumbago with sciatica, unspecified side: Secondary | ICD-10-CM | POA: Diagnosis not present

## 2018-09-25 DIAGNOSIS — C7951 Secondary malignant neoplasm of bone: Secondary | ICD-10-CM | POA: Diagnosis not present

## 2018-10-01 ENCOUNTER — Telehealth: Payer: Self-pay | Admitting: *Deleted

## 2018-10-01 NOTE — Telephone Encounter (Signed)
Patient stated he noted swelling in bilateral feet and ankle, he has no pain noted. Advised to keep LE elevated. Patient reminded of his appointment with [name], MD tomorrow 10/02/18 at 0900 am. Patient verbalized understanding, and appreciation of the call.

## 2018-10-02 ENCOUNTER — Inpatient Hospital Stay: Payer: Medicare Other

## 2018-10-02 ENCOUNTER — Telehealth: Payer: Self-pay

## 2018-10-02 ENCOUNTER — Inpatient Hospital Stay: Payer: Medicare Other | Attending: Oncology | Admitting: Oncology

## 2018-10-02 VITALS — BP 170/97 | HR 79 | Temp 97.8°F | Resp 18 | Ht 71.0 in | Wt 189.3 lb

## 2018-10-02 DIAGNOSIS — Z79899 Other long term (current) drug therapy: Secondary | ICD-10-CM | POA: Insufficient documentation

## 2018-10-02 DIAGNOSIS — Z923 Personal history of irradiation: Secondary | ICD-10-CM | POA: Insufficient documentation

## 2018-10-02 DIAGNOSIS — R531 Weakness: Secondary | ICD-10-CM | POA: Diagnosis not present

## 2018-10-02 DIAGNOSIS — Z9221 Personal history of antineoplastic chemotherapy: Secondary | ICD-10-CM | POA: Insufficient documentation

## 2018-10-02 DIAGNOSIS — C7951 Secondary malignant neoplasm of bone: Secondary | ICD-10-CM | POA: Diagnosis not present

## 2018-10-02 DIAGNOSIS — Z7952 Long term (current) use of systemic steroids: Secondary | ICD-10-CM | POA: Diagnosis not present

## 2018-10-02 DIAGNOSIS — Z7951 Long term (current) use of inhaled steroids: Secondary | ICD-10-CM

## 2018-10-02 DIAGNOSIS — Z79818 Long term (current) use of other agents affecting estrogen receptors and estrogen levels: Secondary | ICD-10-CM | POA: Diagnosis not present

## 2018-10-02 DIAGNOSIS — C61 Malignant neoplasm of prostate: Secondary | ICD-10-CM | POA: Insufficient documentation

## 2018-10-02 DIAGNOSIS — M549 Dorsalgia, unspecified: Secondary | ICD-10-CM | POA: Diagnosis not present

## 2018-10-02 DIAGNOSIS — K625 Hemorrhage of anus and rectum: Secondary | ICD-10-CM | POA: Insufficient documentation

## 2018-10-02 DIAGNOSIS — R600 Localized edema: Secondary | ICD-10-CM | POA: Insufficient documentation

## 2018-10-02 LAB — CBC WITH DIFFERENTIAL (CANCER CENTER ONLY)
ABS IMMATURE GRANULOCYTES: 0.13 10*3/uL — AB (ref 0.00–0.07)
Basophils Absolute: 0 10*3/uL (ref 0.0–0.1)
Basophils Relative: 0 %
EOS PCT: 1 %
Eosinophils Absolute: 0.1 10*3/uL (ref 0.0–0.5)
HEMATOCRIT: 42.3 % (ref 39.0–52.0)
HEMOGLOBIN: 14.2 g/dL (ref 13.0–17.0)
IMMATURE GRANULOCYTES: 2 %
LYMPHS ABS: 0.5 10*3/uL — AB (ref 0.7–4.0)
LYMPHS PCT: 8 %
MCH: 31.5 pg (ref 26.0–34.0)
MCHC: 33.6 g/dL (ref 30.0–36.0)
MCV: 93.8 fL (ref 80.0–100.0)
Monocytes Absolute: 0.7 10*3/uL (ref 0.1–1.0)
Monocytes Relative: 10 %
NEUTROS ABS: 5 10*3/uL (ref 1.7–7.7)
NEUTROS PCT: 79 %
NRBC: 0 % (ref 0.0–0.2)
Platelet Count: 192 10*3/uL (ref 150–400)
RBC: 4.51 MIL/uL (ref 4.22–5.81)
RDW: 14.7 % (ref 11.5–15.5)
WBC Count: 6.4 10*3/uL (ref 4.0–10.5)

## 2018-10-02 LAB — CMP (CANCER CENTER ONLY)
ALT: 57 U/L — AB (ref 0–44)
AST: 26 U/L (ref 15–41)
Albumin: 2.9 g/dL — ABNORMAL LOW (ref 3.5–5.0)
Alkaline Phosphatase: 100 U/L (ref 38–126)
Anion gap: 10 (ref 5–15)
BILIRUBIN TOTAL: 0.4 mg/dL (ref 0.3–1.2)
BUN: 19 mg/dL (ref 8–23)
CHLORIDE: 104 mmol/L (ref 98–111)
CO2: 25 mmol/L (ref 22–32)
CREATININE: 0.74 mg/dL (ref 0.61–1.24)
Calcium: 8.7 mg/dL — ABNORMAL LOW (ref 8.9–10.3)
GFR, Est AFR Am: >60 mL/min (ref >60)
Glucose, Bld: 86 mg/dL (ref 70–99)
POTASSIUM: 4.3 mmol/L (ref 3.5–5.1)
Sodium: 139 mmol/L (ref 135–145)
Total Protein: 5.3 g/dL — ABNORMAL LOW (ref 6.5–8.1)

## 2018-10-02 NOTE — Telephone Encounter (Signed)
Printed avs and calender of upcoming appointment. Per 10/14 los 

## 2018-10-02 NOTE — Progress Notes (Signed)
Hematology and Oncology Follow Up Visit  [name] [id number] 06/23/1941 77 y.o. 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  He was found to have Gleason score of 5+4 = 9 and a PSA of 13.56 at the time of diagnosis.   Prior Therapy: He was treated there with external beam radiation and brachii therapy in January 2017 and completed 2 years of androgen deprivation.  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  Metastatic work-up at that time showed bone involvement as well as lymphadenopathy.    He was treated under the care of Dr. Thera [name] at [facility] initially with enzalutamide started in May 2018 until January 2019 where he presented with progression of disease with bony metastasis, adrenal nodule as well as worsening adenopathy.    Taxotere chemotherapy initially at 60 mg/m and that was reduced to 45 mg/m after cycle 6.  His last treatment was in July 2019 given at [facility].  Repeat imaging studies showed his lymphadenopathy has improved but he did develop worsening bony metastasis based on a lumbar spine obtained on June 24, 2018.  The MRI showed a complete replacement of L3 vertebral body with tumor the tumor in the ventral epidural space behind L3 greater to the left.  Bulky retroperitoneal adenopathy was slightly improved as mentioned.    He is status post decompressive laminectomy and removal of L3 tumor completed on 08/06/2018.  Current therapy:  Zytiga 1000 mg daily started in September 2019.  Androgen deprivation under the care of Dr. Rosana [name] last injection given in August 2019.  Xgeva 120 mg every 2 months.  His next injection scheduled for November 2019.  Interim History:  [name] returns today for a follow-up visit.  Since last visit, he completed radiation therapy to the spine without any major complications.  He still has limitation in his mobility with lower back discomfort.  He does report some weakness after standing for an extended period of time.  He tolerated Zytiga so far without any issues.  He does report some fatigue but his appetite is excellent.  He denies any excessive bone pain.  He does report lower extremity edema associated with dexamethasone which she is currently being tapered off of it.  His quality of life remain unchanged.  He does report some slight discoloration on his foot.   He does not report any headaches, blurry vision, syncope or seizures.  He denied alteration of mental status or confusion.  Does not report any fevers, chills or sweats.  Does not report any cough, wheezing or hemoptysis.  Does not report any chest pain, palpitation, orthopnea or leg edema.  Does not report any nausea, vomiting or abdominal pain.  Does not report any change in his bowel habits.  Does not report any arthralgias or myalgias.   Does not report frequency, urgency or hematuria.  Does not report any skin rashes or lesions.  Does not report any lymphadenopathy or petechiae.  Does not report any mood changes.  Remaining review of systems is negative.    Medications: I have reviewed the patient's current medications.  Current Outpatient Medications  Medication Sig Dispense Refill  . Calcium Carb-Cholecalciferol (CALCIUM 1000 + D PO) Take 30 mLs by mouth daily. Calcium 1000 mg / Vit D  1000 iu    . cycloSPORINE (RESTASIS) 0.05 % ophthalmic emulsion Place 1 drop into both eyes 2 (two) times daily.    Marland Kitchen dexamethasone (DECADRON) 4 MG tablet Take 1 tablet (4 mg total) by mouth 2 (two) times daily. 60 tablet 0  . docusate sodium (COLACE) 100 MG capsule Take 100 mg by mouth 2 (two) times daily.    Marland Kitchen ezetimibe (ZETIA) 10 MG tablet Take 10 mg by mouth every morning.      Marland Kitchen ibuprofen (ADVIL,MOTRIN) 200 MG tablet Take 200 mg by mouth every 6 (six) hours as needed for moderate pain.    Marland Kitchen leuprolide (LUPRON) 30 MG injection Inject 45 mg into the muscle every 6 (six) months.    . ondansetron (ZOFRAN) 4 MG tablet Take 4 mg by mouth every 8 (eight) hours as needed for nausea or vomiting.    Marland Kitchen oxyCODONE (OXY IR/ROXICODONE) 5 MG immediate release tablet Take 1-2 tablets (5-10 mg total) by mouth every 4 (four) hours as needed for severe pain. 90 tablet 0  . pantoprazole (PROTONIX) 40 MG tablet Take 40 mg by mouth daily after supper.     . polyethylene glycol (MIRALAX / GLYCOLAX) packet Take 17 g by mouth daily as needed for mild constipation. 14 each 0  . predniSONE (DELTASONE) 5 MG tablet     . tamsulosin (FLOMAX) 0.4 MG CAPS capsule Take 0.4 mg by mouth daily.     . traMADol (ULTRAM) 50 MG tablet TAKE 1 TABLET BY MOUTH EVERY 6 HOURS AS NEEDED SEVERE PAIN 20 tablet 0  . triamcinolone cream (KENALOG) 0.1 % Apply 1 application topically daily as needed (dry skin in the winter).     Marland Kitchen ZYTIGA 250 MG tablet TAKE 4 TABLETS (1,000 MG TOTAL) BY MOUTH DAILY. TAKE ON AN EMPTY STOMACH 1 HOUR BEFORE OR 2 HOURS AFTER A MEAL 120 tablet 0   No current facility-administered medications for this visit.      Allergies:  Allergies  Allergen Reactions  . Statins Other (See Comments)    Muscle pain    Past Medical History, Surgical history, Social history, and Family History were reviewed and updated.    Physical Exam:  Blood pressure (!) 170/97, pulse 79, temperature 97.8 F (36.6 C), temperature source Oral, resp. rate 18, height 5\' 11"  (1.803 m), weight 189 lb 4.8 oz (85.9 kg), SpO2 99 %.   ECOG: 1   General appearance: Comfortable appearing without any discomfort Head: Normocephalic without any trauma Oropharynx: Mucous membranes are moist and pink without any thrush or ulcers. Eyes: Pupils are equal and round reactive to light. Lymph nodes: No cervical,  supraclavicular, inguinal or axillary lymphadenopathy.   Heart:regular rate and rhythm.  S1 and S2 bilateral ankle edema noted. Lung: Clear without any rhonchi or wheezes.  No dullness to percussion. Abdomin: Soft, nontender, nondistended with good bowel sounds.  No hepatosplenomegaly. Musculoskeletal: No joint deformity or effusion.  Full range of motion noted. Neurological: No deficits noted on motor, sensory and deep tendon reflex exam. Skin: Mild erythema noted on his feet bilaterally. Psychiatric: Mood and affect appeared appropriate.      Lab Results: Lab Results  Component Value Date   WBC 8.1 08/23/2018   HGB 13.1 08/23/2018   HCT 39.8 08/23/2018   MCV 94.8 08/23/2018   PLT 325 08/23/2018     Chemistry      Component Value Date/Time   NA 140 08/23/2018 1435   K 4.6 08/23/2018 1435   CL 107 08/23/2018  1435   CO2 25 08/23/2018 1435   BUN 20 08/23/2018 1435   CREATININE 0.81 08/23/2018 1435      Component Value Date/Time   CALCIUM 9.1 08/23/2018 1435   ALKPHOS 83 08/23/2018 1435   AST 12 (L) 08/23/2018 1435   ALT 11 08/23/2018 1435   BILITOT 0.3 08/23/2018 1435        Impression and Plan:  77 year old man with the following:  1.  Castration-resistant prostate cancer with initial diagnosis in 2016 and a Gleason score of 5+4 = 9 PSA of 13.56 and disease to the bone.      He is currently on Zytiga and has tolerated therapy well.  Risks and benefits of continuing this treatment long-term was reviewed and is agreeable to continue.  His PSA is currently pending we will continue to monitor that periodically.  His next generation sequencing analysis of his tumor did not reveal any actionable mutation at this time.    2.  Spinal metastasis: He completed radiation therapy after decompressive laminectomy.  He does have some back pain and weakness and follows with Dr. [name] regarding this issue.  3.  Bone directed therapy: He is currently on Xgeva which I have  recommended continuing for the time being.  His next injection will be in November 2019.  4.  Androgen deprivation: This will be repeated in 6 months from August 2019 under the care of Dr. Rosana [name].  5.  Pain: Predominantly in the back and currently manageable at this time after his operation.   6.  Lower extremity edema: Related to steroids which is being tapered off dexamethasone.  He will resume low-dose prednisone after dexamethasone taper.  7.  Rectal bleeding: He follows with Dr. Paulita Fujita regarding this issue.  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  8.  Follow-up: We will be in the next 5 weeks to follow his progress.  25  minutes was spent with the patient face-to-face today.  More than 50% of time was dedicated to discussing disease status, complications related therapy and managing these complications at this time.    Zola Button, MD 10/15/20199:14 AM

## 2018-10-03 ENCOUNTER — Telehealth: Payer: Self-pay

## 2018-10-03 DIAGNOSIS — Z483 Aftercare following surgery for neoplasm: Secondary | ICD-10-CM | POA: Diagnosis not present

## 2018-10-03 DIAGNOSIS — C7951 Secondary malignant neoplasm of bone: Secondary | ICD-10-CM | POA: Diagnosis not present

## 2018-10-03 DIAGNOSIS — C61 Malignant neoplasm of prostate: Secondary | ICD-10-CM | POA: Diagnosis not present

## 2018-10-03 DIAGNOSIS — M75121 Complete rotator cuff tear or rupture of right shoulder, not specified as traumatic: Secondary | ICD-10-CM | POA: Diagnosis not present

## 2018-10-03 LAB — PROSTATE-SPECIFIC AG, SERUM (LABCORP): PROSTATE SPECIFIC AG, SERUM: 26.7 ng/mL — AB (ref 0.0–4.0)

## 2018-10-03 NOTE — Telephone Encounter (Signed)
-----   Message from [name], MD sent at 10/03/2018  8:29 AM EDT ----- Please let him know his PSA is coming down slowly.

## 2018-10-03 NOTE — Telephone Encounter (Signed)
Contacted patient and made aware of results. 

## 2018-10-08 ENCOUNTER — Other Ambulatory Visit: Payer: Self-pay | Admitting: Oncology

## 2018-10-10 NOTE — Addendum Note (Signed)
Encounter addended by: Heywood Footman, RN on: 10/10/2018 12:14 PM  Actions taken: Charge Capture section accepted

## 2018-10-12 ENCOUNTER — Other Ambulatory Visit: Payer: Self-pay | Admitting: Oncology

## 2018-10-12 DIAGNOSIS — C61 Malignant neoplasm of prostate: Secondary | ICD-10-CM

## 2018-10-15 ENCOUNTER — Telehealth: Payer: Self-pay

## 2018-10-15 ENCOUNTER — Other Ambulatory Visit: Payer: Self-pay

## 2018-10-15 MED ORDER — OXYCODONE HCL 5 MG PO TABS: 5.0000 mg | ORAL_TABLET | ORAL | 0 refills | Status: DC | PRN

## 2018-10-15 NOTE — Telephone Encounter (Signed)
Patient spouse left VM message requesting refill of Oxycodone. Patient made aware of prescription ready for pick up. Patient discussed pain management with this RN understanding to take the pain medication prior to pain escalating for better management. Patient stated that he has been trying to NOT take the pain medication but will take prior to the pain elevating. He understands to call back if pain management not effective and can be seen in symptom management if needed prior to next MD appointment.

## 2018-10-16 ENCOUNTER — Encounter (HOSPITAL_COMMUNITY): Payer: Self-pay | Admitting: Emergency Medicine

## 2018-10-16 ENCOUNTER — Inpatient Hospital Stay (HOSPITAL_COMMUNITY)
Admission: EM | Admit: 2018-10-16 | Discharge: 2018-10-20 | DRG: 543 | Disposition: A | Discharge status: Home Health | Payer: Medicare Other | Attending: Internal Medicine | Admitting: Internal Medicine

## 2018-10-16 ENCOUNTER — Telehealth: Payer: Self-pay | Admitting: Radiation Oncology

## 2018-10-16 ENCOUNTER — Emergency Department (HOSPITAL_COMMUNITY): Payer: Medicare Other

## 2018-10-16 ENCOUNTER — Other Ambulatory Visit: Payer: Self-pay

## 2018-10-16 DIAGNOSIS — C61 Malignant neoplasm of prostate: Secondary | ICD-10-CM | POA: Diagnosis present

## 2018-10-16 DIAGNOSIS — Z800 Family history of malignant neoplasm of digestive organs: Secondary | ICD-10-CM

## 2018-10-16 DIAGNOSIS — G4489 Other headache syndrome: Secondary | ICD-10-CM | POA: Diagnosis not present

## 2018-10-16 DIAGNOSIS — C7951 Secondary malignant neoplasm of bone: Secondary | ICD-10-CM | POA: Diagnosis not present

## 2018-10-16 DIAGNOSIS — M549 Dorsalgia, unspecified: Secondary | ICD-10-CM | POA: Diagnosis present

## 2018-10-16 DIAGNOSIS — K2270 Barrett's esophagus without dysplasia: Secondary | ICD-10-CM | POA: Diagnosis present

## 2018-10-16 DIAGNOSIS — E785 Hyperlipidemia, unspecified: Secondary | ICD-10-CM | POA: Diagnosis present

## 2018-10-16 DIAGNOSIS — I10 Essential (primary) hypertension: Secondary | ICD-10-CM | POA: Diagnosis present

## 2018-10-16 DIAGNOSIS — M8448XA Pathological fracture, other site, initial encounter for fracture: Secondary | ICD-10-CM | POA: Diagnosis not present

## 2018-10-16 DIAGNOSIS — Z87891 Personal history of nicotine dependence: Secondary | ICD-10-CM

## 2018-10-16 DIAGNOSIS — M48061 Spinal stenosis, lumbar region without neurogenic claudication: Secondary | ICD-10-CM | POA: Diagnosis not present

## 2018-10-16 DIAGNOSIS — M8458XA Pathological fracture in neoplastic disease, other specified site, initial encounter for fracture: Secondary | ICD-10-CM | POA: Diagnosis not present

## 2018-10-16 DIAGNOSIS — Z8719 Personal history of other diseases of the digestive system: Secondary | ICD-10-CM

## 2018-10-16 DIAGNOSIS — S32039A Unspecified fracture of third lumbar vertebra, initial encounter for closed fracture: Secondary | ICD-10-CM | POA: Diagnosis not present

## 2018-10-16 DIAGNOSIS — Z8042 Family history of malignant neoplasm of prostate: Secondary | ICD-10-CM

## 2018-10-16 DIAGNOSIS — Z79891 Long term (current) use of opiate analgesic: Secondary | ICD-10-CM

## 2018-10-16 DIAGNOSIS — R0602 Shortness of breath: Secondary | ICD-10-CM | POA: Diagnosis not present

## 2018-10-16 DIAGNOSIS — C772 Secondary and unspecified malignant neoplasm of intra-abdominal lymph nodes: Secondary | ICD-10-CM | POA: Diagnosis not present

## 2018-10-16 DIAGNOSIS — Z96643 Presence of artificial hip joint, bilateral: Secondary | ICD-10-CM | POA: Diagnosis present

## 2018-10-16 DIAGNOSIS — Z923 Personal history of irradiation: Secondary | ICD-10-CM

## 2018-10-16 DIAGNOSIS — T380X5A Adverse effect of glucocorticoids and synthetic analogues, initial encounter: Secondary | ICD-10-CM | POA: Diagnosis present

## 2018-10-16 DIAGNOSIS — L8990 Pressure ulcer of unspecified site, unspecified stage: Secondary | ICD-10-CM | POA: Diagnosis present

## 2018-10-16 DIAGNOSIS — R739 Hyperglycemia, unspecified: Secondary | ICD-10-CM | POA: Diagnosis present

## 2018-10-16 DIAGNOSIS — Z79899 Other long term (current) drug therapy: Secondary | ICD-10-CM

## 2018-10-16 DIAGNOSIS — Z791 Long term (current) use of non-steroidal anti-inflammatories (NSAID): Secondary | ICD-10-CM

## 2018-10-16 DIAGNOSIS — M7989 Other specified soft tissue disorders: Secondary | ICD-10-CM | POA: Diagnosis present

## 2018-10-16 DIAGNOSIS — Z888 Allergy status to other drugs, medicaments and biological substances status: Secondary | ICD-10-CM

## 2018-10-16 DIAGNOSIS — Z23 Encounter for immunization: Secondary | ICD-10-CM

## 2018-10-16 DIAGNOSIS — M25551 Pain in right hip: Secondary | ICD-10-CM | POA: Diagnosis not present

## 2018-10-16 DIAGNOSIS — N400 Enlarged prostate without lower urinary tract symptoms: Secondary | ICD-10-CM | POA: Diagnosis present

## 2018-10-16 DIAGNOSIS — R079 Chest pain, unspecified: Secondary | ICD-10-CM | POA: Diagnosis not present

## 2018-10-16 DIAGNOSIS — S32030A Wedge compression fracture of third lumbar vertebra, initial encounter for closed fracture: Secondary | ICD-10-CM | POA: Diagnosis not present

## 2018-10-16 DIAGNOSIS — L89151 Pressure ulcer of sacral region, stage 1: Secondary | ICD-10-CM | POA: Diagnosis present

## 2018-10-16 DIAGNOSIS — R0789 Other chest pain: Secondary | ICD-10-CM | POA: Diagnosis not present

## 2018-10-16 DIAGNOSIS — K219 Gastro-esophageal reflux disease without esophagitis: Secondary | ICD-10-CM | POA: Diagnosis present

## 2018-10-16 LAB — URINALYSIS, ROUTINE W REFLEX MICROSCOPIC
BILIRUBIN URINE: NEGATIVE
Bacteria, UA: NONE SEEN
GLUCOSE, UA: NEGATIVE mg/dL
KETONES UR: NEGATIVE mg/dL
LEUKOCYTES UA: NEGATIVE
NITRITE: NEGATIVE
PH: 6 (ref 5.0–8.0)
PROTEIN: NEGATIVE mg/dL
Specific Gravity, Urine: 1.01 (ref 1.005–1.030)

## 2018-10-16 LAB — CBC WITH DIFFERENTIAL/PLATELET
ABS IMMATURE GRANULOCYTES: 0.06 10*3/uL (ref 0.00–0.07)
BASOS ABS: 0 10*3/uL (ref 0.0–0.1)
Basophils Relative: 1 %
EOS PCT: 1 %
Eosinophils Absolute: 0.1 10*3/uL (ref 0.0–0.5)
HEMATOCRIT: 43.2 % (ref 39.0–52.0)
HEMOGLOBIN: 14.1 g/dL (ref 13.0–17.0)
Immature Granulocytes: 1 %
LYMPHS PCT: 12 %
Lymphs Abs: 0.6 10*3/uL — ABNORMAL LOW (ref 0.7–4.0)
MCH: 31.5 pg (ref 26.0–34.0)
MCHC: 32.6 g/dL (ref 30.0–36.0)
MCV: 96.6 fL (ref 80.0–100.0)
MONO ABS: 0.4 10*3/uL (ref 0.1–1.0)
Monocytes Relative: 9 %
NEUTROS ABS: 3.8 10*3/uL (ref 1.7–7.7)
NRBC: 0 % (ref 0.0–0.2)
Neutrophils Relative %: 76 %
Platelets: 286 10*3/uL (ref 150–400)
RBC: 4.47 MIL/uL (ref 4.22–5.81)
RDW: 14.8 % (ref 11.5–15.5)
WBC: 5 10*3/uL (ref 4.0–10.5)

## 2018-10-16 LAB — BASIC METABOLIC PANEL
Anion gap: 11 (ref 5–15)
BUN: 9 mg/dL (ref 8–23)
CHLORIDE: 105 mmol/L (ref 98–111)
CO2: 21 mmol/L — AB (ref 22–32)
CREATININE: 0.98 mg/dL (ref 0.61–1.24)
Calcium: 8.9 mg/dL (ref 8.9–10.3)
GFR calc Af Amer: >60 mL/min (ref >60)
GFR calc non Af Amer: >60 mL/min (ref >60)
Glucose, Bld: 93 mg/dL (ref 70–99)
POTASSIUM: 3.4 mmol/L — AB (ref 3.5–5.1)
SODIUM: 137 mmol/L (ref 135–145)

## 2018-10-16 LAB — I-STAT CG4 LACTIC ACID, ED
LACTIC ACID, VENOUS: 3.61 mmol/L — AB (ref 0.5–1.9)
Lactic Acid, Venous: 1.41 mmol/L (ref 0.5–1.9)

## 2018-10-16 LAB — SEDIMENTATION RATE: Sed Rate: 22 mm/hr — ABNORMAL HIGH (ref 0–16)

## 2018-10-16 MED ORDER — MORPHINE SULFATE (PF) 4 MG/ML IV SOLN
4.0000 mg | Freq: Once | INTRAVENOUS | Status: AC
  Administered 2018-10-16: 4 mg via INTRAVENOUS
  Filled 2018-10-16: qty 1

## 2018-10-16 MED ORDER — POLYETHYLENE GLYCOL 3350 17 G PO PACK: 17.0000 g | PACK | Freq: Every day | ORAL | Status: DC | PRN

## 2018-10-16 MED ORDER — CYCLOSPORINE 0.05 % OP EMUL
1.0000 [drp] | Freq: Two times a day (BID) | OPHTHALMIC | Status: DC
  Administered 2018-10-16 – 2018-10-20 (×8): 1 [drp] via OPHTHALMIC
  Filled 2018-10-16 (×9): qty 1

## 2018-10-16 MED ORDER — VANCOMYCIN HCL 10 G IV SOLR
1500.0000 mg | Freq: Once | INTRAVENOUS | Status: DC
  Administered 2018-10-16: 1500 mg via INTRAVENOUS
  Filled 2018-10-16: qty 1500

## 2018-10-16 MED ORDER — DEXAMETHASONE SODIUM PHOSPHATE 10 MG/ML IJ SOLN
10.0000 mg | Freq: Once | INTRAMUSCULAR | Status: AC
  Administered 2018-10-16: 10 mg via INTRAVENOUS
  Filled 2018-10-16: qty 1

## 2018-10-16 MED ORDER — VANCOMYCIN HCL IN DEXTROSE 1-5 GM/200ML-% IV SOLN: 1000.0000 mg | Freq: Two times a day (BID) | INTRAVENOUS | Status: DC

## 2018-10-16 MED ORDER — DOXYCYCLINE HYCLATE 100 MG PO TABS
100.0000 mg | ORAL_TABLET | Freq: Two times a day (BID) | ORAL | Status: DC
  Administered 2018-10-17 – 2018-10-19 (×5): 100 mg via ORAL
  Filled 2018-10-16 (×6): qty 1

## 2018-10-16 MED ORDER — TAMSULOSIN HCL 0.4 MG PO CAPS
0.4000 mg | ORAL_CAPSULE | Freq: Every day | ORAL | Status: DC
  Administered 2018-10-17 – 2018-10-20 (×4): 0.4 mg via ORAL
  Filled 2018-10-16 (×5): qty 1

## 2018-10-16 MED ORDER — IBUPROFEN 200 MG PO TABS: 200.0000 mg | ORAL_TABLET | Freq: Four times a day (QID) | ORAL | Status: DC | PRN

## 2018-10-16 MED ORDER — ONDANSETRON HCL 4 MG PO TABS: 4.0000 mg | ORAL_TABLET | Freq: Three times a day (TID) | ORAL | Status: DC | PRN

## 2018-10-16 MED ORDER — MORPHINE SULFATE (PF) 4 MG/ML IV SOLN
4.0000 mg | Freq: Once | INTRAVENOUS | Status: DC
  Filled 2018-10-16: qty 1

## 2018-10-16 MED ORDER — TRAMADOL HCL 50 MG PO TABS
50.0000 mg | ORAL_TABLET | Freq: Four times a day (QID) | ORAL | Status: DC | PRN
  Administered 2018-10-19: 50 mg via ORAL
  Filled 2018-10-16: qty 1

## 2018-10-16 MED ORDER — DEXAMETHASONE SODIUM PHOSPHATE 10 MG/ML IJ SOLN
4.0000 mg | Freq: Four times a day (QID) | INTRAMUSCULAR | Status: DC
  Administered 2018-10-17 – 2018-10-20 (×14): 4 mg via INTRAVENOUS
  Filled 2018-10-16 (×14): qty 1

## 2018-10-16 MED ORDER — HYDROMORPHONE HCL 1 MG/ML IJ SOLN
1.0000 mg | Freq: Once | INTRAMUSCULAR | Status: AC
  Administered 2018-10-16: 1 mg via INTRAVENOUS
  Filled 2018-10-16: qty 1

## 2018-10-16 MED ORDER — EZETIMIBE 10 MG PO TABS
10.0000 mg | ORAL_TABLET | Freq: Every morning | ORAL | Status: DC
  Administered 2018-10-17 – 2018-10-20 (×4): 10 mg via ORAL
  Filled 2018-10-16 (×4): qty 1

## 2018-10-16 MED ORDER — PANTOPRAZOLE SODIUM 40 MG PO TBEC
40.0000 mg | DELAYED_RELEASE_TABLET | Freq: Every day | ORAL | Status: DC
  Administered 2018-10-16 – 2018-10-19 (×4): 40 mg via ORAL
  Filled 2018-10-16 (×4): qty 1

## 2018-10-16 MED ORDER — ABIRATERONE ACETATE 250 MG PO TABS
1000.0000 mg | ORAL_TABLET | Freq: Every day | ORAL | Status: DC
  Administered 2018-10-17 – 2018-10-20 (×4): 1000 mg via ORAL
  Filled 2018-10-16: qty 4

## 2018-10-16 MED ORDER — TRIAMCINOLONE ACETONIDE 0.1 % EX CREA
1.0000 "application " | TOPICAL_CREAM | Freq: Every day | CUTANEOUS | Status: DC | PRN
  Filled 2018-10-16: qty 15

## 2018-10-16 MED ORDER — PIPERACILLIN-TAZOBACTAM 3.375 G IVPB 30 MIN
3.3750 g | Freq: Once | INTRAVENOUS | Status: AC
  Administered 2018-10-16: 3.375 g via INTRAVENOUS
  Filled 2018-10-16: qty 50

## 2018-10-16 MED ORDER — SODIUM CHLORIDE 0.9 % IV BOLUS
1000.0000 mL | Freq: Once | INTRAVENOUS | Status: AC
  Administered 2018-10-16: 1000 mL via INTRAVENOUS

## 2018-10-16 MED ORDER — GADOBUTROL 1 MMOL/ML IV SOLN
10.0000 mL | Freq: Once | INTRAVENOUS | Status: AC | PRN
  Administered 2018-10-16: 10 mL via INTRAVENOUS

## 2018-10-16 MED ORDER — OXYCODONE HCL 5 MG PO TABS
5.0000 mg | ORAL_TABLET | ORAL | Status: DC | PRN
  Administered 2018-10-16 – 2018-10-17 (×2): 10 mg via ORAL
  Administered 2018-10-17: 5 mg via ORAL
  Administered 2018-10-18 – 2018-10-20 (×7): 10 mg via ORAL
  Filled 2018-10-16 (×7): qty 2
  Filled 2018-10-16: qty 1
  Filled 2018-10-16 (×2): qty 2

## 2018-10-16 MED ORDER — DOCUSATE SODIUM 100 MG PO CAPS
100.0000 mg | ORAL_CAPSULE | Freq: Two times a day (BID) | ORAL | Status: DC
  Administered 2018-10-17 – 2018-10-20 (×7): 100 mg via ORAL
  Filled 2018-10-16 (×8): qty 1

## 2018-10-16 NOTE — Telephone Encounter (Signed)
Patient wife called stating pt was in the hosptial and she will resch. His f/u with Ashlyn at another time.

## 2018-10-16 NOTE — Progress Notes (Signed)
Pharmacy Antibiotic Note  [name] is a 77 y.o. male admitted on 10/16/2018 with sepsis.  Pharmacy has been consulted for vancomycin dosing. Pt is afebrile and WBC is WNL. Scr is WNL. Lactic acid is elevated.   Plan: Vancomycin 1500mg  IV x 1 then 1gm IV Q12h F/u renal fxn, C&S, clinical status and trough at SS  Height: 5\' 11"  (180.3 cm) Weight: 189 lb 6 oz (85.9 kg) IBW/kg (Calculated) : 75.3  Temp (24hrs), Avg:99.5 F (37.5 C), Min:99.5 F (37.5 C), Max:99.5 F (37.5 C)  Recent Labs  Lab 10/16/18 1128 10/16/18 1145  WBC 5.0  --   CREATININE 0.98  --   LATICACIDVEN  --  3.61*    Estimated Creatinine Clearance: 68.3 mL/min (by C-G formula based on SCr of 0.98 mg/dL).    Allergies  Allergen Reactions  . Statins Other (See Comments)    Muscle pain    Antimicrobials this admission: Vanc 10/29>> Zosyn x 10/29  Dose adjustments this admission: N/A  Microbiology results: Pending  Thank you for allowing pharmacy to be a part of this patient's care.  Elva Breaker, Rande Lawman 10/16/2018 12:26 PM

## 2018-10-16 NOTE — ED Triage Notes (Addendum)
Hx of prostate cancer  That has rad to spine , has had surgery to remove tumor in spine in august and has had subsequent infections  In his back, yesterday had excruciating pain in left flank , rt hip that rads to rt knee, has had 2 rounds of antibiotics, and has had radiation to spine also, pt is on oral chemo

## 2018-10-16 NOTE — H&P (Signed)
History and Physical    [name] ZWC:[id number] DOB: 06/09/1941 DOA: 10/16/2018  I have briefly reviewed the patient's prior medical records in [place]  PCP: [name], MD  Patient coming from: home   Chief Complaint: back pain  HPI: [name] is a 77 y.o. 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 [name], currently on Zytiga and Lupron, hyperlipidemia, arthritis, who presents to the hospital with chief complaint of back pain.  Patient has noticed that in the last 24 hours his left lower back has been hurting more, and at times is excruciating.  He is okay if he is not moving around however has difficulties ambulating due to these, and difficulties transferring.  He denies any fever or chills.  He did have a small drainage from the surgical site and he was placed on doxycycline in the outpatient setting, and his drainage has improved.  He has 3 days of antibiotics left.  He is also been complaining of bilateral lower extremity swelling after starting steroids.  He is weaning off steroids however the swelling is still persistent and he is concerned about it.  He is otherwise at baseline, denies any chest pain, denies any shortness of breath, no abdominal pain, nausea vomiting or diarrhea.  He treats his pain at home with oxycodone alternating with tramadol and ibuprofen.  He denies any lower extremity weakness, urinary or fecal incontinence.  He denies any numbness or tingling.  ED Course: In the emergency room his vital signs are stable, he was found to have a temperature of 99.5 but on repeat he was afebrile.  His blood work is unremarkable.  He was found to have a lactic acid of 3.6 and was given empiric antibiotics.  He underwent an MRI of the L-spine which showed worsening metastatic disease, with new severe L3 pathologic fracture, new L1 metastasis, worsening  retroperitoneal nodal metastasis, epidural tumor resulting in moderate L2-3 and L3-4 canal stenosis.  Neurosurgery was consulted, they recommended steroids and we were called to admit  Review of Systems: As per HPI otherwise 10 point review of systems negative.   Past Medical History:  Diagnosis Date  . Arthritis   . Barrett's esophagus   . Bone cancer ([place])   . GERD (gastroesophageal reflux disease)    takes Protonix daily  . History of colon polyps   . Hyperlipidemia    takes Zetia and Welchol daily  . Joint pain   . Muscle spasm    takes Robaxin daily as needed  . Prostate cancer [facility])     Past Surgical History:  Procedure Laterality Date  . COLONOSCOPY    . ESOPHAGOGASTRODUODENOSCOPY    . KNEE ARTHROSCOPY Right 2011  . LUMBAR LAMINECTOMY/DECOMPRESSION MICRODISCECTOMY Left 08/06/2018   Procedure: Laminectomy and Foraminotomy - Lumbar Three-Four- left;  Surgeon: Kary Kos, MD;  Location: [place];  Service: Neurosurgery;  Laterality: Left;  left  . TOTAL HIP ARTHROPLASTY Left 05/07/2013   Procedure: LEFT TOTAL HIP ARTHROPLASTY ANTERIOR APPROACH;  Surgeon: Mcarthur Rossetti, MD;  Location: [place];  Service: Orthopedics;  Laterality: Left;  . TOTAL HIP ARTHROPLASTY Right   . TOTAL HIP REVISION Right 02/02/2015   Procedure: TOTAL HIP REVISION;  Surgeon: Kerin Salen, MD;  Location: [place];  Service: Orthopedics;  Laterality: Right;     reports that he quit smoking about 49 years ago. He has a 0.50 pack-year  smoking history. He has quit using smokeless tobacco. He reports that he does not drink alcohol or use drugs.  Allergies  Allergen Reactions  . Statins Other (See Comments)    Muscle pain    Family History  Problem Relation Age of Onset  . Colon cancer Mother   . Colon cancer Brother   . Prostate cancer Brother     Prior to Admission medications   Medication Sig Start Date End Date Taking? Authorizing Provider  Calcium Carb-Cholecalciferol (CALCIUM 1000 + D PO)  Take 30 mLs by mouth daily. Calcium 1000 mg / Vit D 1000 iu    [provider]  cycloSPORINE (RESTASIS) 0.05 % ophthalmic emulsion Place 1 drop into both eyes 2 (two) times daily.    [provider]  dexamethasone (DECADRON) 4 MG tablet Take 1 tablet (4 mg total) by mouth 2 (two) times daily. 09/04/18   Hayden Pedro, PA-C  docusate sodium (COLACE) 100 MG capsule Take 100 mg by mouth 2 (two) times daily.    [provider]  ezetimibe (ZETIA) 10 MG tablet Take 10 mg by mouth every morning.     [provider]  ibuprofen (ADVIL,MOTRIN) 200 MG tablet Take 200 mg by mouth every 6 (six) hours as needed for moderate pain.    [provider]  leuprolide (LUPRON) 30 MG injection Inject 45 mg into the muscle every 6 (six) months.    [provider]  ondansetron (ZOFRAN) 4 MG tablet Take 4 mg by mouth every 8 (eight) hours as needed for nausea or vomiting.    [provider]  oxyCODONE (OXY IR/ROXICODONE) 5 MG immediate release tablet Take 1-2 tablets (5-10 mg total) by mouth every 4 (four) hours as needed for severe pain. 10/15/18   [name], MD  pantoprazole (PROTONIX) 40 MG tablet Take 40 mg by mouth daily after supper.     [provider]  polyethylene glycol (MIRALAX / GLYCOLAX) packet Take 17 g by mouth daily as needed for mild constipation. 08/17/18   Geradine Girt, DO  predniSONE (DELTASONE) 5 MG tablet  08/27/18   [provider]  tamsulosin (FLOMAX) 0.4 MG CAPS capsule Take 0.4 mg by mouth daily.  08/10/16   [provider]  traMADol (ULTRAM) 50 MG tablet TAKE 1 TABLET BY MOUTH EVERY 6 HOURS AS NEEDED SEVERE PAIN 10/08/18   [name], MD  triamcinolone cream (KENALOG) 0.1 % Apply 1 application topically daily as needed (dry skin in the winter).     [provider]  ZYTIGA 250 MG tablet TAKE 4 TABLETS (1,000 MG TOTAL) BY MOUTH DAILY. TAKE ON AN EMPTY STOMACH 1 HOUR BEFORE OR 2 HOURS  AFTER A MEAL 10/12/18   [name], MD    Physical Exam: Vitals:   10/16/18 1438 10/16/18 1445 10/16/18 1500 10/16/18 1515  BP:  117/84  115/79  Pulse:  (!) 109  (!) 102  Resp:   18   Temp: 98.8 F (37.1 C)     TempSrc: Oral     SpO2:  94%  94%  Weight:      Height:        Constitutional: NAD, calm, comfortable Eyes: PERRL, lids and conjunctivae normal ENMT: Mucous membranes are moist. Posterior pharynx clear of any exudate or lesions.Normal dentition.  Neck: normal, supple, no masses, no thyromegaly Respiratory: clear to auscultation bilaterally, no wheezing, no crackles. Normal respiratory effort. No accessory muscle use.  Cardiovascular: Regular rate and rhythm,  no murmurs / rubs / gallops.  1+ pitting lower extremity edema, bilateral Abdomen: no tenderness, no masses palpated. Bowel sounds positive.  Musculoskeletal: no clubbing / cyanosis. Normal muscle tone.  Skin: no rashes, mid lower back surgical site incision healing well without noticeable cellulitis or drainage Neurologic: CN 2-12 grossly intact. Strength 5/5 in all 4.  Psychiatric: Normal judgment and insight. Alert and oriented x 3. Normal mood.   Labs on Admission: I have personally reviewed following labs and imaging studies  CBC: Recent Labs  Lab 10/16/18 1128  WBC 5.0  NEUTROABS 3.8  HGB 14.1  HCT 43.2  MCV 96.6  PLT 809   Basic Metabolic Panel: Recent Labs  Lab 10/16/18 1128  NA 137  K 3.4*  CL 105  CO2 21*  GLUCOSE 93  BUN 9  CREATININE 0.98  CALCIUM 8.9   GFR: Estimated Creatinine Clearance: 68.3 mL/min (by C-G formula based on SCr of 0.98 mg/dL). Liver Function Tests: No results for input(s): AST, ALT, ALKPHOS, BILITOT, PROT, ALBUMIN in the last 168 hours. No results for input(s): LIPASE, AMYLASE in the last 168 hours. No results for input(s): AMMONIA in the last 168 hours. Coagulation Profile: No results for input(s): INR, PROTIME in the last 168 hours. Cardiac Enzymes: No  results for input(s): CKTOTAL, CKMB, CKMBINDEX, TROPONINI in the last 168 hours. BNP (last 3 results) No results for input(s): PROBNP in the last 8760 hours. HbA1C: No results for input(s): HGBA1C in the last 72 hours. CBG: No results for input(s): GLUCAP in the last 168 hours. Lipid Profile: No results for input(s): CHOL, HDL, LDLCALC, TRIG, CHOLHDL, LDLDIRECT in the last 72 hours. Thyroid Function Tests: No results for input(s): TSH, T4TOTAL, FREET4, T3FREE, THYROIDAB in the last 72 hours. Anemia Panel: No results for input(s): VITAMINB12, FOLATE, FERRITIN, TIBC, IRON, RETICCTPCT in the last 72 hours. Urine analysis:    Component Value Date/Time   COLORURINE YELLOW 08/16/2018 [place] 08/16/2018 1131   LABSPEC 1.010 08/16/2018 1131   PHURINE 6.0 08/16/2018 1131   GLUCOSEU NEGATIVE 08/16/2018 1131   HGBUR SMALL (A) 08/16/2018 1131   BILIRUBINUR NEGATIVE 08/16/2018 1131   KETONESUR NEGATIVE 08/16/2018 1131   PROTEINUR NEGATIVE 08/16/2018 1131   UROBILINOGEN 0.2 01/22/2015 0939   NITRITE NEGATIVE 08/16/2018 [place] 08/16/2018 1131     Radiological Exams on Admission: Mr Lumbar Spine W Wo Contrast  Result Date: 10/16/2018 CLINICAL DATA:  Back pain radiating to RIGHT hip and leg. History of metastatic prostate cancer, L3-4 laminectomy August 2019. EXAM: MRI LUMBAR SPINE WITHOUT AND WITH CONTRAST TECHNIQUE: Multiplanar and multiecho pulse sequences of the lumbar spine were obtained without and with intravenous contrast. CONTRAST:  10 cc Gadavist COMPARISON:  MRI lumbar spine June 24, 2018 FINDINGS: SEGMENTATION: For the purposes of this report, the last well-formed intervertebral disc is reported as L5-S1. ALIGNMENT: Maintained lumbar lordosis. Minimal grade 1 anterolisthesis. VERTEBRAE:Severe at L3-1 burst fracture new from prior examination with greater than 75% central height loss, diffusely low T1, heterogeneously bright STIR signal with  enhancement. New enhancing low signal 22 mm metastasis RIGHT L1 middle column extending to RIGHT pedicle. Old moderate to severe L5 compression fracture. Diffusely bright T1 bone marrow signal compatible with post radiation change in osteopenia. Moderate L1-2 disc height loss, remaining disc morphology is maintained with diffuse desiccation and superimposed mild disc edema L2-3 through L4-5. new LEFT L2-3 hemilaminectomy with subcentimeter fluid collection within surgical bed, likely seroma without suspicious enhancement. CONUS MEDULLARIS  AND CAUDA EQUINA: Conus medullaris terminates at L1-2 and demonstrates normal morphology and signal characteristics though slightly displaced to the RIGHT. New nonenhancing expansile fluid signal posterior thecal sac from the included thoracic spine to the level of L3-4, anteriorly displacing the cauda equina. No abnormal cord or leptomeningeal enhancement. PARASPINAL AND OTHER SOFT TISSUES: Worsening prevertebral/retroperitoneal lymphadenopathy L1, similar at L3 and L5. Ventral tumor at L2-3. Postoperative LEFT multifidus denervation at and below the level of surgical intervention. Mildly edematous iliopsoas muscles worse than prior study. T2 bright cyst bilateral kidneys. DISC LEVELS: T12-L1: No disc bulge, canal stenosis nor neural foraminal narrowing. L1-2: Small broad-based disc bulge, small LEFT extraforaminal disc protrusion no canal stenosis. Mild LEFT neural foraminal narrowing. L2-3: Anterolisthesis. LEFT laminectomy. Mild RIGHT facet arthropathy. Tumoral extension and retro pulsed bony fragments extending into ventral epidural space narrowing the canal to 7 mm. Tumor within the medial LEFT neural foramen resulting in moderate stenosis. Mild to moderate stenosis RIGHT neural foramen. L3-4: Small broad-based disc bulge and minimal ventral epidural tumor, mild facet arthropathy and ligamentum flavum redundancy. Moderate canal stenosis. Tumor within cephalad neural foramen.  Mild RIGHT and moderate LEFT neural foraminal narrowing. L4-5: Endplate spurring. Mild facet arthropathy and ligamentum flavum redundancy. Mild canal stenosis. Mild bilateral neural foraminal narrowing. L5-S1: No disc bulge, canal stenosis nor neural foraminal narrowing. Mild facet arthropathy. IMPRESSION: 1. New severe L3 pathologic fracture, potentially acute. New L1 osseous metastasis. Worsening retroperitoneal nodal metastasis. 2. Status post LEFT L2-3 laminectomy. New posterior thecal cyst (pseudomeningocele, arachnoid adhesions or hygroma). Mass effect with anterior displacement of the cauda equina. 3. Epidural tumor (and retropulsed bony fragments at L3) resulting in moderate L2-3 and L3-4 canal stenosis. 4. Neural foraminal narrowing L1-2 through L4-5 moderate on the LEFT at L3-4 in part due to tumor. Electronically Signed   By: Elon Alas M.D.   On: 10/16/2018 15:17    Assessment/Plan Active Problems:   Prostate cancer metastatic to bone [facility])   Metastatic prostate cancer with spine mets -Neurosurgery consult appreciated, will place on high-dose IV Decadron and monitor clinically -discussed case with Dr. [name] over the phone, see how he responds to steroids, and if no surgery will be planned then by patient may benefit from transfer to [facility] and start radiation therapy later on -Pain control with home regimen for now,  Bilateral lower extremity swelling -Obtain Doppler lower extremity ultrasound  Low-grade temp on admission -Patient not septic, he is already on doxycycline which was started as an outpatient, no evidence of cellulitis or drainage, no leukocytosis, stop broad-spectrum IV antibiotics and placed back on home doxycycline which she needs for 3 additional days  Hyperlipidemia -Continue home medications   DVT prophylaxis: SCDs  Code Status: Full code  Family Communication: wife present at bedside  Disposition Plan: admit to [place], Home when ready Consults  called: Neurosurgery, oncology   Marzetta Board, MD, PhD [facility] Pager [phone]  If 7PM-7AM, please contact night-coverage [email or web address] Password [email or web address]  10/16/2018, 4:11 PM

## 2018-10-16 NOTE — ED Provider Notes (Signed)
[place] EMERGENCY DEPARTMENT Provider Note   CSN: [id number] Arrival date & time: 10/16/18  1055     History   Chief Complaint Chief Complaint  Patient presents with  . Flank Pain  . Back Pain    HPI [name] is a 77 y.o. male.  HPI  77 year old male presents with acute left sided back pain. Started yesterday. Has metastatic prostate cancer and on 8/19 he had decompressive laminectomy by Dr. [name] at L3-4 metastatic prostate cancer with epidural extension.  Since then he has had a wound infection and is on his second round of doxycycline.  He has been having some drainage from the wound.  No fevers.  He is been having bilateral leg weakness for a few months.  He is been having right-sided radicular pain as well since the surgery.  No left-sided symptoms.  All of a sudden yesterday he developed severe left-sided back pain.  No urinary symptoms or abdominal symptoms.  No fevers.  He is currently on oral chemotherapy.  Past Medical History:  Diagnosis Date  . Arthritis   . Barrett's esophagus   . Bone cancer ([place])   . GERD (gastroesophageal reflux disease)    takes Protonix daily  . History of colon polyps   . Hyperlipidemia    takes Zetia and Welchol daily  . Joint pain   . Muscle spasm    takes Robaxin daily as needed  . Prostate cancer [facility])     Patient Active Problem List   Diagnosis Date Noted  . Left hip pain 08/17/2018  . Delirium 08/16/2018  . 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 Prostate cancer ([place]) 07/24/2018  . Complete tear of right rotator cuff 02/20/2017  . Arthritis of right shoulder region 02/20/2017  . 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 COLONOSCOPY    . ESOPHAGOGASTRODUODENOSCOPY    . KNEE ARTHROSCOPY Right 2011  . LUMBAR LAMINECTOMY/DECOMPRESSION  MICRODISCECTOMY Left 08/06/2018   Procedure: Laminectomy and Foraminotomy - Lumbar Three-Four- left;  Surgeon: Kary Kos, MD;  Location: [place];  Service: Neurosurgery;  Laterality: Left;  left  . TOTAL HIP ARTHROPLASTY Left 05/07/2013   Procedure: LEFT TOTAL HIP ARTHROPLASTY ANTERIOR APPROACH;  Surgeon: Mcarthur Rossetti, MD;  Location: [place];  Service: Orthopedics;  Laterality: Left;  . TOTAL HIP ARTHROPLASTY Right   . TOTAL HIP REVISION Right 02/02/2015   Procedure: TOTAL HIP REVISION;  Surgeon: Kerin Salen, MD;  Location: [place];  Service: Orthopedics;  Laterality: Right;        Home Medications    Prior to Admission medications   Medication Sig Start Date End Date Taking? Authorizing Provider  Calcium Carb-Cholecalciferol (CALCIUM 1000 + D PO) Take 30 mLs by mouth daily. Calcium 1000 mg / Vit D 1000 iu    [provider]  cycloSPORINE (RESTASIS) 0.05 % ophthalmic emulsion Place 1 drop into both eyes 2 (two) times daily.    [provider]  dexamethasone (DECADRON) 4 MG tablet Take 1 tablet (4 mg total) by mouth 2 (two) times daily. 09/04/18   Hayden Pedro, PA-C  docusate sodium (COLACE) 100 MG capsule Take 100 mg by mouth 2 (two) times daily.    [provider]  ezetimibe (ZETIA) 10 MG tablet Take 10 mg by mouth every morning.  [provider]  ibuprofen (ADVIL,MOTRIN) 200 MG tablet Take 200 mg by mouth every 6 (six) hours as needed for moderate pain.    [provider]  leuprolide (LUPRON) 30 MG injection Inject 45 mg into the muscle every 6 (six) months.    [provider]  ondansetron (ZOFRAN) 4 MG tablet Take 4 mg by mouth every 8 (eight) hours as needed for nausea or vomiting.    [provider]  oxyCODONE (OXY IR/ROXICODONE) 5 MG immediate release tablet Take 1-2 tablets (5-10 mg total) by mouth every 4 (four) hours as needed for severe pain. 10/15/18   [name], MD  pantoprazole (PROTONIX) 40 MG  tablet Take 40 mg by mouth daily after supper.     [provider]  polyethylene glycol (MIRALAX / GLYCOLAX) packet Take 17 g by mouth daily as needed for mild constipation. 08/17/18   Geradine Girt, DO  predniSONE (DELTASONE) 5 MG tablet  08/27/18   [provider]  tamsulosin (FLOMAX) 0.4 MG CAPS capsule Take 0.4 mg by mouth daily.  08/10/16   [provider]  traMADol (ULTRAM) 50 MG tablet TAKE 1 TABLET BY MOUTH EVERY 6 HOURS AS NEEDED SEVERE PAIN 10/08/18   [name], MD  triamcinolone cream (KENALOG) 0.1 % Apply 1 application topically daily as needed (dry skin in the winter).     [provider]  ZYTIGA 250 MG tablet TAKE 4 TABLETS (1,000 MG TOTAL) BY MOUTH DAILY. TAKE ON AN EMPTY STOMACH 1 HOUR BEFORE OR 2 HOURS AFTER A MEAL 10/12/18   [name], MD    Family History Family History  Problem Relation Age of Onset  . Colon cancer Mother   . Colon cancer Brother   . Prostate cancer Brother     Social History Social History   Tobacco Use  . Smoking status: Former Smoker    Packs/day: 0.25    Years: 2.00    Pack years: 0.50    Last attempt to quit: 12/19/1968    Years since quitting: 49.8  . Smokeless tobacco: Former Systems developer  . Tobacco comment: quit smoking 38yrs ago  Substance Use Topics  . Alcohol use: No  . Drug use: No     Allergies   Statins   Review of Systems Review of Systems  Constitutional: Negative for fever.  Gastrointestinal: Negative for abdominal pain.  Musculoskeletal: Positive for back pain.  Neurological: Positive for weakness. Negative for numbness.  All other systems reviewed and are negative.    Physical Exam Updated Vital Signs BP 126/79   Pulse (!) 107   Temp 98.8 F (37.1 C) (Oral)   Resp 16   Ht 5\' 11"  (1.803 m)   Wt 85.9 kg   SpO2 91%   BMI 26.41 kg/m   Physical Exam  Constitutional: He appears well-developed and well-nourished. No distress.  HENT:  Head: Normocephalic and atraumatic.   Right Ear: External ear normal.  Left Ear: External ear normal.  Nose: Nose normal.  Eyes: Right eye exhibits no discharge. Left eye exhibits no discharge.  Neck: Neck supple.  Cardiovascular: Regular rhythm and normal heart sounds. Tachycardia present.  Pulmonary/Chest: Effort normal and breath sounds normal.  Abdominal: Soft. There is no tenderness. There is no CVA tenderness.  Musculoskeletal: He exhibits no edema.       Lumbar back: He exhibits tenderness.       Back:  Neurological: He is alert.  5/5 strength in BLE. Normal gross sensation.  Induces pain but appears to be intact with neurologic function  Skin: Skin is warm and dry. He is not diaphoretic.  Psychiatric: His mood appears not anxious.  Nursing note and vitals reviewed.    ED Treatments / Results  Labs (all labs ordered are listed, but only abnormal results are displayed) Labs Reviewed  BASIC METABOLIC PANEL - Abnormal; Notable for the following components:      Result Value   Potassium 3.4 (*)    CO2 21 (*)    All other components within normal limits  CBC WITH DIFFERENTIAL/PLATELET - Abnormal; Notable for the following components:   Lymphs Abs 0.6 (*)    All other components within normal limits  SEDIMENTATION RATE - Abnormal; Notable for the following components:   Sed Rate 22 (*)    All other components within normal limits  I-STAT CG4 LACTIC ACID, ED - Abnormal; Notable for the following components:   Lactic Acid, Venous 3.61 (*)    All other components within normal limits  CULTURE, BLOOD (ROUTINE X 2)  CULTURE, BLOOD (ROUTINE X 2)  URINALYSIS, ROUTINE W REFLEX MICROSCOPIC  I-STAT CG4 LACTIC ACID, ED    EKG None  Radiology Mr Lumbar Spine W Wo Contrast  Result Date: 10/16/2018 CLINICAL DATA:  Back pain radiating to RIGHT hip and leg. History of metastatic prostate cancer, L3-4 laminectomy August 2019. EXAM: MRI LUMBAR SPINE WITHOUT AND WITH CONTRAST TECHNIQUE: Multiplanar and multiecho pulse  sequences of the lumbar spine were obtained without and with intravenous contrast. CONTRAST:  10 cc Gadavist COMPARISON:  MRI lumbar spine June 24, 2018 FINDINGS: SEGMENTATION: For the purposes of this report, the last well-formed intervertebral disc is reported as L5-S1. ALIGNMENT: Maintained lumbar lordosis. Minimal grade 1 anterolisthesis. VERTEBRAE:Severe at L3-1 burst fracture new from prior examination with greater than 75% central height loss, diffusely low T1, heterogeneously bright STIR signal with enhancement. New enhancing low signal 22 mm metastasis RIGHT L1 middle column extending to RIGHT pedicle. Old moderate to severe L5 compression fracture. Diffusely bright T1 bone marrow signal compatible with post radiation change in osteopenia. Moderate L1-2 disc height loss, remaining disc morphology is maintained with diffuse desiccation and superimposed mild disc edema L2-3 through L4-5. new LEFT L2-3 hemilaminectomy with subcentimeter fluid collection within surgical bed, likely seroma without suspicious enhancement. CONUS MEDULLARIS AND CAUDA EQUINA: Conus medullaris terminates at L1-2 and demonstrates normal morphology and signal characteristics though slightly displaced to the RIGHT. New nonenhancing expansile fluid signal posterior thecal sac from the included thoracic spine to the level of L3-4, anteriorly displacing the cauda equina. No abnormal cord or leptomeningeal enhancement. PARASPINAL AND OTHER SOFT TISSUES: Worsening prevertebral/retroperitoneal lymphadenopathy L1, similar at L3 and L5. Ventral tumor at L2-3. Postoperative LEFT multifidus denervation at and below the level of surgical intervention. Mildly edematous iliopsoas muscles worse than prior study. T2 bright cyst bilateral kidneys. DISC LEVELS: T12-L1: No disc bulge, canal stenosis nor neural foraminal narrowing. L1-2: Small broad-based disc bulge, small LEFT extraforaminal disc protrusion no canal stenosis. Mild LEFT neural foraminal  narrowing. L2-3: Anterolisthesis. LEFT laminectomy. Mild RIGHT facet arthropathy. Tumoral extension and retro pulsed bony fragments extending into ventral epidural space narrowing the canal to 7 mm. Tumor within the medial LEFT neural foramen resulting in moderate stenosis. Mild to moderate stenosis RIGHT neural foramen. L3-4: Small broad-based disc bulge and minimal ventral epidural tumor, mild facet arthropathy and ligamentum flavum redundancy. Moderate canal stenosis. Tumor within cephalad neural foramen. Mild RIGHT and moderate LEFT neural foraminal narrowing. L4-5:  Endplate spurring. Mild facet arthropathy and ligamentum flavum redundancy. Mild canal stenosis. Mild bilateral neural foraminal narrowing. L5-S1: No disc bulge, canal stenosis nor neural foraminal narrowing. Mild facet arthropathy. IMPRESSION: 1. New severe L3 pathologic fracture, potentially acute. New L1 osseous metastasis. Worsening retroperitoneal nodal metastasis. 2. Status post LEFT L2-3 laminectomy. New posterior thecal cyst (pseudomeningocele, arachnoid adhesions or hygroma). Mass effect with anterior displacement of the cauda equina. 3. Epidural tumor (and retropulsed bony fragments at L3) resulting in moderate L2-3 and L3-4 canal stenosis. 4. Neural foraminal narrowing L1-2 through L4-5 moderate on the LEFT at L3-4 in part due to tumor. Electronically Signed   By: Elon Alas M.D.   On: 10/16/2018 15:17    Procedures Procedures (including critical care time)  Medications Ordered in ED Medications  vancomycin (VANCOCIN) 1,500 mg in sodium chloride 0.9 % 500 mL IVPB (1,500 mg Intravenous New Bag/Given 10/16/18 1444)  vancomycin (VANCOCIN) IVPB 1000 mg/200 mL premix (has no administration in time range)  dexamethasone (DECADRON) injection 10 mg (has no administration in time range)  morphine 4 MG/ML injection 4 mg (4 mg Intravenous Given 10/16/18 1132)  sodium chloride 0.9 % bolus 1,000 mL (0 mLs Intravenous Stopped 10/16/18  1438)  piperacillin-tazobactam (ZOSYN) IVPB 3.375 g (0 g Intravenous Stopped 10/16/18 1324)  HYDROmorphone (DILAUDID) injection 1 mg (1 mg Intravenous Given 10/16/18 1216)  HYDROmorphone (DILAUDID) injection 1 mg (1 mg Intravenous Given 10/16/18 1444)  gadobutrol (GADAVIST) 1 MMOL/ML injection 10 mL (10 mLs Intravenous Contrast Given 10/16/18 1439)     Initial Impression / Assessment and Plan / ED Course  I have reviewed the triage vital signs and the nursing notes.  Pertinent labs & imaging results that were available during my care of the patient were reviewed by me and considered in my medical decision making (see chart for details).     Patient was worked up for an acute postop infection given low-grade temperature of 99.2 as well as tachycardia and acute on subacute pain.  Given initial lactic acid of 3.6, was given broad antibiotics while MRI was pending.  MRI shows new pathologic fracture of L3 as well as new posterior thecal cyst.  There is mass-effect on the cauda equina.  There is also epidural tumor.  I discussed with Dr. [name] who has evaluated the MRI and the report and seen the patient.  He feels like this is all cancer related.  He asks for IV Decadron, 10 mg.  He would also like medicine to admit to help stage his cancer to further clarify what treatment he will need for this.  Hospitalist to admit.  Final Clinical Impressions(s) / ED Diagnoses   Final diagnoses:  Closed fracture of third lumbar vertebra, unspecified fracture morphology, initial encounter [facility])    ED Discharge Orders    None       Sherwood Gambler, MD 10/16/18 [phone]

## 2018-10-16 NOTE — ED Notes (Signed)
Pt in MRI at this time. Will collect lactic acid when pt returns.

## 2018-10-17 ENCOUNTER — Encounter (HOSPITAL_COMMUNITY): Payer: Self-pay | Admitting: General Practice

## 2018-10-17 ENCOUNTER — Ambulatory Visit: Admission: RE | Admit: 2018-10-17 | Payer: Medicare Other | Source: Ambulatory Visit | Admitting: Urology

## 2018-10-17 ENCOUNTER — Observation Stay (HOSPITAL_BASED_OUTPATIENT_CLINIC_OR_DEPARTMENT_OTHER): Payer: Medicare Other

## 2018-10-17 DIAGNOSIS — M8458XA Pathological fracture in neoplastic disease, other specified site, initial encounter for fracture: Principal | ICD-10-CM

## 2018-10-17 DIAGNOSIS — L8990 Pressure ulcer of unspecified site, unspecified stage: Secondary | ICD-10-CM | POA: Diagnosis present

## 2018-10-17 DIAGNOSIS — M5440 Lumbago with sciatica, unspecified side: Secondary | ICD-10-CM | POA: Diagnosis not present

## 2018-10-17 DIAGNOSIS — M7989 Other specified soft tissue disorders: Secondary | ICD-10-CM | POA: Diagnosis present

## 2018-10-17 DIAGNOSIS — R739 Hyperglycemia, unspecified: Secondary | ICD-10-CM | POA: Diagnosis not present

## 2018-10-17 DIAGNOSIS — R609 Edema, unspecified: Secondary | ICD-10-CM

## 2018-10-17 DIAGNOSIS — C61 Malignant neoplasm of prostate: Secondary | ICD-10-CM

## 2018-10-17 DIAGNOSIS — M48061 Spinal stenosis, lumbar region without neurogenic claudication: Secondary | ICD-10-CM | POA: Diagnosis not present

## 2018-10-17 DIAGNOSIS — M549 Dorsalgia, unspecified: Secondary | ICD-10-CM | POA: Diagnosis present

## 2018-10-17 DIAGNOSIS — D497 Neoplasm of unspecified behavior of endocrine glands and other parts of nervous system: Secondary | ICD-10-CM | POA: Diagnosis not present

## 2018-10-17 LAB — COMPREHENSIVE METABOLIC PANEL
ALT: 29 U/L (ref 0–44)
AST: 18 U/L (ref 15–41)
Albumin: 2.5 g/dL — ABNORMAL LOW (ref 3.5–5.0)
Alkaline Phosphatase: 88 U/L (ref 38–126)
Anion gap: 9 (ref 5–15)
BILIRUBIN TOTAL: 0.6 mg/dL (ref 0.3–1.2)
BUN: 13 mg/dL (ref 8–23)
CO2: 21 mmol/L — ABNORMAL LOW (ref 22–32)
Calcium: 8.4 mg/dL — ABNORMAL LOW (ref 8.9–10.3)
Chloride: 108 mmol/L (ref 98–111)
Creatinine, Ser: 0.76 mg/dL (ref 0.61–1.24)
GFR calc non Af Amer: >60 mL/min (ref >60)
Glucose, Bld: 170 mg/dL — ABNORMAL HIGH (ref 70–99)
POTASSIUM: 4.4 mmol/L (ref 3.5–5.1)
Sodium: 138 mmol/L (ref 135–145)
Total Protein: 5 g/dL — ABNORMAL LOW (ref 6.5–8.1)

## 2018-10-17 LAB — CBC
HEMATOCRIT: 36.8 % — AB (ref 39.0–52.0)
Hemoglobin: 11.9 g/dL — ABNORMAL LOW (ref 13.0–17.0)
MCH: 31 pg (ref 26.0–34.0)
MCHC: 32.3 g/dL (ref 30.0–36.0)
MCV: 95.8 fL (ref 80.0–100.0)
Platelets: 288 10*3/uL (ref 150–400)
RBC: 3.84 MIL/uL — AB (ref 4.22–5.81)
RDW: 14.7 % (ref 11.5–15.5)
WBC: 4.6 10*3/uL (ref 4.0–10.5)
nRBC: 0 % (ref 0.0–0.2)

## 2018-10-17 LAB — GLUCOSE, CAPILLARY
Glucose-Capillary: 194 mg/dL — ABNORMAL HIGH (ref 70–99)
Glucose-Capillary: 149 mg/dL — ABNORMAL HIGH (ref 70–99)
Glucose-Capillary: 116 mg/dL — ABNORMAL HIGH (ref 70–99)

## 2018-10-17 MED ORDER — INSULIN ASPART 100 UNIT/ML ~~LOC~~ SOLN: 0.0000 [IU] | Freq: Every day | SUBCUTANEOUS | Status: DC

## 2018-10-17 MED ORDER — INSULIN ASPART 100 UNIT/ML ~~LOC~~ SOLN
0.0000 [IU] | Freq: Three times a day (TID) | SUBCUTANEOUS | Status: DC
  Administered 2018-10-17: 2 [IU] via SUBCUTANEOUS
  Administered 2018-10-18: 1 [IU] via SUBCUTANEOUS
  Filled 2018-10-17: qty 1

## 2018-10-17 MED ORDER — INFLUENZA VAC SPLIT HIGH-DOSE 0.5 ML IM SUSY
0.5000 mL | PREFILLED_SYRINGE | INTRAMUSCULAR | Status: AC
  Administered 2018-10-20: 0.5 mL via INTRAMUSCULAR
  Filled 2018-10-17: qty 0.5

## 2018-10-17 MED ORDER — HYDRALAZINE HCL 20 MG/ML IJ SOLN: 5.0000 mg | INTRAMUSCULAR | Status: DC | PRN

## 2018-10-17 NOTE — Progress Notes (Addendum)
[facility] PROGRESS NOTE  [name] JKK:[id number] DOB: Apr 09, 1941 DOA: 10/16/2018 PCP: [name], MD  Assessment/Plan:  Metastatic prostate cancer with spine mets/back pain -pain improved with steroids -Neurosurgery consult appreciated. Plan is to discuss options with oncology and Tumor board. In meantime pain management -Pain control with home regimen for now, -lumbar corsett per dr cram  Bilateral lower extremity swelling -Doppler lower extremity ultrasound neg for pe  Low-grade temp on admission -max temp 99.5. continuing doxycycline which was started as an outpatient, no evidence of cellulitis or drainage, no leukocytosis. -incentive spirometry  Hyperlipidemia -Continue home medications  Hypertension. BP high end of normal. No hx of same. Likely related to pain -prn hydralazine -monitor closely  Hyperglycemia. Likely steroid induced. No hx of diabetes -SSI -monitor  Code Status: full Family Communication: wife and daughter at bedside Disposition Plan: to be determined   Consultants:  Dr [name] neurosurger  Procedures:  doppler  Antibiotics:  doxy  HPI/Subjective: Patient reports back pain much improved this am.   Patient admitted with intractable back pain. Work up revealed worsening mets to spine in spite of radiation. Plan for neurosurgery and onc to take case to Tumor committee discuss options/prognosis.   Objective: Vitals:   10/17/18 0632 10/17/18 1250  BP: (!) 151/90 (!) 187/106  Pulse: 86 84  Resp: 18 18  Temp: 98 F (36.7 C)   SpO2: 97% 100%    Intake/Output Summary (Last 24 hours) at 10/17/2018 1313 Last data filed at 10/16/2018 1728 Gross per 24 hour  Intake 1600 ml  Output -  Net 1600 ml   Filed Weights   10/16/18 1200  Weight: 85.9 kg    Exam:   General:  Well nourished slightly pale no acute distress  Cardiovascular: rrr no mgr no LE edema  Respiratory: normal effort BS clear bilaterally no  wheeze  Abdomen: soft +BS no guarding or rebounding  Musculoskeletal: lumbar spine tender to touch other joints without swelling/erythema   Data Reviewed: Basic Metabolic Panel: Recent Labs  Lab 10/16/18 1128 10/17/18 0514  NA 137 138  K 3.4* 4.4  CL 105 108  CO2 21* 21*  GLUCOSE 93 170*  BUN 9 13  CREATININE 0.98 0.76  CALCIUM 8.9 8.4*   Liver Function Tests: Recent Labs  Lab 10/17/18 0514  AST 18  ALT 29  ALKPHOS 88  BILITOT 0.6  PROT 5.0*  ALBUMIN 2.5*   No results for input(s): LIPASE, AMYLASE in the last 168 hours. No results for input(s): AMMONIA in the last 168 hours. CBC: Recent Labs  Lab 10/16/18 1128 10/17/18 0514  WBC 5.0 4.6  NEUTROABS 3.8  --   HGB 14.1 11.9*  HCT 43.2 36.8*  MCV 96.6 95.8  PLT 286 288   Cardiac Enzymes: No results for input(s): CKTOTAL, CKMB, CKMBINDEX, TROPONINI in the last 168 hours. BNP (last 3 results) Recent Labs    08/16/18 0951  BNP 104.5*    ProBNP (last 3 results) No results for input(s): PROBNP in the last 8760 hours.  CBG: No results for input(s): GLUCAP in the last 168 hours.  Recent Results (from the past 240 hour(s))  Culture, blood (routine x 2)     Status: None (Preliminary result)   Collection Time: 10/16/18 12:19 PM  Result Value Ref Range Status   Specimen Description BLOOD LEFT ANTECUBITAL  Final   Special Requests   Final    BOTTLES DRAWN AEROBIC AND ANAEROBIC Blood Culture adequate volume   Culture   Final  NO GROWTH < 24 HOURS Performed at [facility] [address] [place], [place] [address]    Report Status PENDING  Incomplete  Culture, blood (routine x 2)     Status: None (Preliminary result)   Collection Time: 10/16/18 12:24 PM  Result Value Ref Range Status   Specimen Description BLOOD LEFT HAND  Final   Special Requests   Final    BOTTLES DRAWN AEROBIC AND ANAEROBIC Blood Culture results may not be optimal due to an inadequate volume of blood received in culture bottles    Culture   Final    NO GROWTH < 24 HOURS Performed at [facility], [place] [address], [place], [place] [address]    Report Status PENDING  Incomplete     Studies: Mr Lumbar Spine W Wo Contrast  Result Date: 10/16/2018 CLINICAL DATA:  Back pain radiating to RIGHT hip and leg. History of metastatic prostate cancer, L3-4 laminectomy August 2019. EXAM: MRI LUMBAR SPINE WITHOUT AND WITH CONTRAST TECHNIQUE: Multiplanar and multiecho pulse sequences of the lumbar spine were obtained without and with intravenous contrast. CONTRAST:  10 cc Gadavist COMPARISON:  MRI lumbar spine June 24, 2018 FINDINGS: SEGMENTATION: For the purposes of this report, the last well-formed intervertebral disc is reported as L5-S1. ALIGNMENT: Maintained lumbar lordosis. Minimal grade 1 anterolisthesis. VERTEBRAE:Severe at L3-1 burst fracture new from prior examination with greater than 75% central height loss, diffusely low T1, heterogeneously bright STIR signal with enhancement. New enhancing low signal 22 mm metastasis RIGHT L1 middle column extending to RIGHT pedicle. Old moderate to severe L5 compression fracture. Diffusely bright T1 bone marrow signal compatible with post radiation change in osteopenia. Moderate L1-2 disc height loss, remaining disc morphology is maintained with diffuse desiccation and superimposed mild disc edema L2-3 through L4-5. new LEFT L2-3 hemilaminectomy with subcentimeter fluid collection within surgical bed, likely seroma without suspicious enhancement. CONUS MEDULLARIS AND CAUDA EQUINA: Conus medullaris terminates at L1-2 and demonstrates normal morphology and signal characteristics though slightly displaced to the RIGHT. New nonenhancing expansile fluid signal posterior thecal sac from the included thoracic spine to the level of L3-4, anteriorly displacing the cauda equina. No abnormal cord or leptomeningeal enhancement. PARASPINAL AND OTHER SOFT TISSUES: Worsening prevertebral/retroperitoneal  lymphadenopathy L1, similar at L3 and L5. Ventral tumor at L2-3. Postoperative LEFT multifidus denervation at and below the level of surgical intervention. Mildly edematous iliopsoas muscles worse than prior study. T2 bright cyst bilateral kidneys. DISC LEVELS: T12-L1: No disc bulge, canal stenosis nor neural foraminal narrowing. L1-2: Small broad-based disc bulge, small LEFT extraforaminal disc protrusion no canal stenosis. Mild LEFT neural foraminal narrowing. L2-3: Anterolisthesis. LEFT laminectomy. Mild RIGHT facet arthropathy. Tumoral extension and retro pulsed bony fragments extending into ventral epidural space narrowing the canal to 7 mm. Tumor within the medial LEFT neural foramen resulting in moderate stenosis. Mild to moderate stenosis RIGHT neural foramen. L3-4: Small broad-based disc bulge and minimal ventral epidural tumor, mild facet arthropathy and ligamentum flavum redundancy. Moderate canal stenosis. Tumor within cephalad neural foramen. Mild RIGHT and moderate LEFT neural foraminal narrowing. L4-5: Endplate spurring. Mild facet arthropathy and ligamentum flavum redundancy. Mild canal stenosis. Mild bilateral neural foraminal narrowing. L5-S1: No disc bulge, canal stenosis nor neural foraminal narrowing. Mild facet arthropathy. IMPRESSION: 1. New severe L3 pathologic fracture, potentially acute. New L1 osseous metastasis. Worsening retroperitoneal nodal metastasis. 2. Status post LEFT L2-3 laminectomy. New posterior thecal cyst (pseudomeningocele, arachnoid adhesions or hygroma). Mass effect with anterior displacement of the cauda  equina. 3. Epidural tumor (and retropulsed bony fragments at L3) resulting in moderate L2-3 and L3-4 canal stenosis. 4. Neural foraminal narrowing L1-2 through L4-5 moderate on the LEFT at L3-4 in part due to tumor. Electronically Signed   By: Elon Alas M.D.   On: 10/16/2018 15:17    Scheduled Meds: . abiraterone acetate  1,000 mg Oral Daily  . cycloSPORINE   1 drop Both Eyes BID  . dexamethasone  4 mg Intravenous Q6H  . docusate sodium  100 mg Oral BID  . doxycycline  100 mg Oral Q12H  . ezetimibe  10 mg Oral q morning - 10a  . [START ON 10/18/2018] Influenza vac split quadrivalent PF  0.5 mL Intramuscular Tomorrow-1000  . pantoprazole  40 mg Oral QPC supper  . tamsulosin  0.4 mg Oral Daily   Continuous Infusions:  Principal Problem:   Back pain Active Problems:   Prostate cancer metastatic to bone ([facility])   Swelling of lower extremity   Hyperglycemia   Pressure injury of skin    Time spent: 40 minutes    [place] NP [facility]  If 7PM-7AM, please contact night-coverage at [email or web address], password [facility] 10/17/2018, 1:13 PM  LOS: 0 days

## 2018-10-17 NOTE — Progress Notes (Signed)
Bilateral lower extremity venous duplex completed. Preliminary results - There is no evidence of a DVT or Baker's cyst. Toma Copier, RVS 10/17/2018, 10:53 AM

## 2018-10-17 NOTE — Consult Note (Signed)
Reason for Consult:back pain and radiculopathy Referring Physician: ER and triad hospitalists  [name] is an 77 y.o. male.  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 Presented to me back in July with primarily left leg radiculopathy. Had extensive conversations with his oncologist and he was undergoing chemotherapy at the time but he was experiencing intractable pain from his radiculopathy and AFTER extensive conversations with his oncologist and with the patient and family they did not want to proceed with a full gross total resection of his L3 vertebral body with instrument effusion but did want to just try to see if we could get away with a laminotomy and foraminotomy with removal of some epidural tumor around his left L4 nerve root. So we performed the operation went very well and patient got significant improvement and was slowly and progressively improving as following him serially in the office with serial x-rays to make sure that his tumor involvement L3 didn't progress to a pathologic fracture of L3 and he had a mild compression deformity on serial x-rays that was stable. However he showed up in the ER yesterday with intractable back pain and bilateral lower extremity radicular pain and a little bit worse weakness in his feet that he had preoperatively had baseline foot drops. Workup showed significant extension and spread of his cancer with increased very aortic and rectoperineal adenopathy evidence of spread of his prostate cancer to L1 and L5 extension with pathologic fracture of L3 and additional growth of epidural tumor at L3-4. He does have moderate stenosis at L3-4. I recommended continued medical workup with discussions with oncology regarding patient's prognosis and ability to control systemic disease. An additional surgery would would require posterior decompression partial vertebrectomy with instrument effusion and with known  cancer spreading adjacent to the problem we may end up having a significant increase incidence of hardware failure or fusion failure. I've introduced some of this with patient and family we will await interpretation and workup from medicine and oncology. Patient is significantly improved overnight on IV steroids I will obtain a lumbar corset and continue to follow along with you.  Past Medical History:  Diagnosis Date  . Arthritis   . Barrett's esophagus   . Bone cancer ([place])   . GERD (gastroesophageal reflux disease)    takes Protonix daily  . History of colon polyps   . Hyperlipidemia    takes Zetia and Welchol daily  . Joint pain   . Muscle spasm    takes Robaxin daily as needed  . Prostate cancer [facility])     Past Surgical History:  Procedure Laterality Date  . COLONOSCOPY    . ESOPHAGOGASTRODUODENOSCOPY    . KNEE ARTHROSCOPY Right 2011  . LUMBAR LAMINECTOMY/DECOMPRESSION MICRODISCECTOMY Left 08/06/2018   Procedure: Laminectomy and Foraminotomy - Lumbar Three-Four- left;  Surgeon: Kary Kos, MD;  Location: [place];  Service: Neurosurgery;  Laterality: Left;  left  . TOTAL HIP ARTHROPLASTY Left 05/07/2013   Procedure: LEFT TOTAL HIP ARTHROPLASTY ANTERIOR APPROACH;  Surgeon: Mcarthur Rossetti, MD;  Location: [place];  Service: Orthopedics;  Laterality: Left;  . TOTAL HIP ARTHROPLASTY Right   . TOTAL HIP REVISION Right 02/02/2015   Procedure: TOTAL HIP REVISION;  Surgeon: Kerin Salen, MD;  Location: [place];  Service: Orthopedics;  Laterality: Right;    Family History  Problem Relation Age of Onset  . Colon cancer Mother   . Colon cancer Brother   . Prostate  cancer Brother     Social History:  reports that he quit smoking about 49 years ago. He has a 0.50 pack-year smoking history. He has quit using smokeless tobacco. He reports that he does not drink alcohol or use drugs.  Allergies:  Allergies  Allergen Reactions  . Statins Other (See Comments)    Muscle pain     Medications: I have reviewed the patient's current medications.  Results for orders placed or performed during the hospital encounter of 10/16/18 (from the past 48 hour(s))  Basic metabolic panel     Status: Abnormal   Collection Time: 10/16/18 11:28 AM  Result Value Ref Range   Sodium 137 135 - 145 mmol/L   Potassium 3.4 (L) 3.5 - 5.1 mmol/L   Chloride 105 98 - 111 mmol/L   CO2 21 (L) 22 - 32 mmol/L   Glucose, Bld 93 70 - 99 mg/dL   BUN 9 8 - 23 mg/dL   Creatinine, Ser 0.98 0.61 - 1.24 mg/dL   Calcium 8.9 8.9 - 10.3 mg/dL   GFR calc non Af Amer >60 >60 mL/min   GFR calc Af Amer >60 >60 mL/min    Comment: (NOTE) The eGFR has been calculated using the CKD EPI equation. This calculation has not been validated in all clinical situations. eGFR's persistently <60 mL/min signify possible Chronic Kidney Disease.    Anion gap 11 5 - 15    Comment: Performed at [facility] [address] [place], [place] [address]  CBC with Differential     Status: Abnormal   Collection Time: 10/16/18 11:28 AM  Result Value Ref Range   WBC 5.0 4.0 - 10.5 K/uL   RBC 4.47 4.22 - 5.81 MIL/uL   Hemoglobin 14.1 13.0 - 17.0 g/dL   HCT 43.2 39.0 - 52.0 %   MCV 96.6 80.0 - 100.0 fL   MCH 31.5 26.0 - 34.0 pg   MCHC 32.6 30.0 - 36.0 g/dL   RDW 14.8 11.5 - 15.5 %   Platelets 286 150 - 400 K/uL   nRBC 0.0 0.0 - 0.2 %   Neutrophils Relative % 76 %   Neutro Abs 3.8 1.7 - 7.7 K/uL   Lymphocytes Relative 12 %   Lymphs Abs 0.6 (L) 0.7 - 4.0 K/uL   Monocytes Relative 9 %   Monocytes Absolute 0.4 0.1 - 1.0 K/uL   Eosinophils Relative 1 %   Eosinophils Absolute 0.1 0.0 - 0.5 K/uL   Basophils Relative 1 %   Basophils Absolute 0.0 0.0 - 0.1 K/uL   Immature Granulocytes 1 %   Abs Immature Granulocytes 0.06 0.00 - 0.07 K/uL    Comment: Performed at [facility] [address], [place], [place] [address]  Sedimentation rate     Status: Abnormal   Collection Time: 10/16/18 11:28 AM  Result  Value Ref Range   Sed Rate 22 (H) 0 - 16 mm/hr    Comment: Performed at [facility] [address] [place], [place] [address]  I-Stat CG4 Lactic Acid, ED     Status: Abnormal   Collection Time: 10/16/18 11:45 AM  Result Value Ref Range   Lactic Acid, Venous 3.61 (HH) 0.5 - 1.9 mmol/L   Comment NOTIFIED PHYSICIAN   I-Stat CG4 Lactic Acid, ED     Status: None   Collection Time: 10/16/18  3:31 PM  Result Value Ref Range   Lactic Acid, Venous 1.41 0.5 - 1.9 mmol/L  Urinalysis, Routine w  reflex microscopic     Status: Abnormal   Collection Time: 10/16/18  4:50 PM  Result Value Ref Range   Color, Urine STRAW (A) YELLOW   APPearance CLEAR CLEAR   Specific Gravity, Urine 1.010 1.005 - 1.030   pH 6.0 5.0 - 8.0   Glucose, UA NEGATIVE NEGATIVE mg/dL   Hgb urine dipstick SMALL (A) NEGATIVE   Bilirubin Urine NEGATIVE NEGATIVE   Ketones, ur NEGATIVE NEGATIVE mg/dL   Protein, ur NEGATIVE NEGATIVE mg/dL   Nitrite NEGATIVE NEGATIVE   Leukocytes, UA NEGATIVE NEGATIVE   RBC / HPF 0-5 0 - 5 RBC/hpf   WBC, UA 0-5 0 - 5 WBC/hpf   Bacteria, UA NONE SEEN NONE SEEN    Comment: Performed at [facility] [address], [place], [place] [address]  Comprehensive metabolic panel     Status: Abnormal   Collection Time: 10/17/18  5:14 AM  Result Value Ref Range   Sodium 138 135 - 145 mmol/L   Potassium 4.4 3.5 - 5.1 mmol/L   Chloride 108 98 - 111 mmol/L   CO2 21 (L) 22 - 32 mmol/L   Glucose, Bld 170 (H) 70 - 99 mg/dL   BUN 13 8 - 23 mg/dL   Creatinine, Ser 0.76 0.61 - 1.24 mg/dL   Calcium 8.4 (L) 8.9 - 10.3 mg/dL   Total Protein 5.0 (L) 6.5 - 8.1 g/dL   Albumin 2.5 (L) 3.5 - 5.0 g/dL   AST 18 15 - 41 U/L   ALT 29 0 - 44 U/L   Alkaline Phosphatase 88 38 - 126 U/L   Total Bilirubin 0.6 0.3 - 1.2 mg/dL   GFR calc non Af Amer >60 >60 mL/min   GFR calc Af Amer >60 >60 mL/min    Comment: (NOTE) The eGFR has been calculated using the CKD EPI equation. This calculation has not been  validated in all clinical situations. eGFR's persistently <60 mL/min signify possible Chronic Kidney Disease.    Anion gap 9 5 - 15    Comment: Performed at [facility] [address], [place], [place] [address]  CBC     Status: Abnormal   Collection Time: 10/17/18  5:14 AM  Result Value Ref Range   WBC 4.6 4.0 - 10.5 K/uL   RBC 3.84 (L) 4.22 - 5.81 MIL/uL   Hemoglobin 11.9 (L) 13.0 - 17.0 g/dL   HCT 36.8 (L) 39.0 - 52.0 %   MCV 95.8 80.0 - 100.0 fL   MCH 31.0 26.0 - 34.0 pg   MCHC 32.3 30.0 - 36.0 g/dL   RDW 14.7 11.5 - 15.5 %   Platelets 288 150 - 400 K/uL   nRBC 0.0 0.0 - 0.2 %    Comment: Performed at [facility], [place] [address] [place], [place] [address]    Mr Lumbar Spine W Wo Contrast  Result Date: 10/16/2018 CLINICAL DATA:  Back pain radiating to RIGHT hip and leg. History of metastatic prostate cancer, L3-4 laminectomy August 2019. EXAM: MRI LUMBAR SPINE WITHOUT AND WITH CONTRAST TECHNIQUE: Multiplanar and multiecho pulse sequences of the lumbar spine were obtained without and with intravenous contrast. CONTRAST:  10 cc Gadavist COMPARISON:  MRI lumbar spine June 24, 2018 FINDINGS: SEGMENTATION: For the purposes of this report, the last well-formed intervertebral disc is reported as L5-S1. ALIGNMENT: Maintained lumbar lordosis. Minimal grade 1 anterolisthesis. VERTEBRAE:Severe at L3-1 burst fracture new from prior examination with greater than 75% central height loss, diffusely low T1,  heterogeneously bright STIR signal with enhancement. New enhancing low signal 22 mm metastasis RIGHT L1 middle column extending to RIGHT pedicle. Old moderate to severe L5 compression fracture. Diffusely bright T1 bone marrow signal compatible with post radiation change in osteopenia. Moderate L1-2 disc height loss, remaining disc morphology is maintained with diffuse desiccation and superimposed mild disc edema L2-3 through L4-5. new LEFT L2-3 hemilaminectomy with subcentimeter fluid  collection within surgical bed, likely seroma without suspicious enhancement. CONUS MEDULLARIS AND CAUDA EQUINA: Conus medullaris terminates at L1-2 and demonstrates normal morphology and signal characteristics though slightly displaced to the RIGHT. New nonenhancing expansile fluid signal posterior thecal sac from the included thoracic spine to the level of L3-4, anteriorly displacing the cauda equina. No abnormal cord or leptomeningeal enhancement. PARASPINAL AND OTHER SOFT TISSUES: Worsening prevertebral/retroperitoneal lymphadenopathy L1, similar at L3 and L5. Ventral tumor at L2-3. Postoperative LEFT multifidus denervation at and below the level of surgical intervention. Mildly edematous iliopsoas muscles worse than prior study. T2 bright cyst bilateral kidneys. DISC LEVELS: T12-L1: No disc bulge, canal stenosis nor neural foraminal narrowing. L1-2: Small broad-based disc bulge, small LEFT extraforaminal disc protrusion no canal stenosis. Mild LEFT neural foraminal narrowing. L2-3: Anterolisthesis. LEFT laminectomy. Mild RIGHT facet arthropathy. Tumoral extension and retro pulsed bony fragments extending into ventral epidural space narrowing the canal to 7 mm. Tumor within the medial LEFT neural foramen resulting in moderate stenosis. Mild to moderate stenosis RIGHT neural foramen. L3-4: Small broad-based disc bulge and minimal ventral epidural tumor, mild facet arthropathy and ligamentum flavum redundancy. Moderate canal stenosis. Tumor within cephalad neural foramen. Mild RIGHT and moderate LEFT neural foraminal narrowing. L4-5: Endplate spurring. Mild facet arthropathy and ligamentum flavum redundancy. Mild canal stenosis. Mild bilateral neural foraminal narrowing. L5-S1: No disc bulge, canal stenosis nor neural foraminal narrowing. Mild facet arthropathy. IMPRESSION: 1. New severe L3 pathologic fracture, potentially acute. New L1 osseous metastasis. Worsening retroperitoneal nodal metastasis. 2. Status post  LEFT L2-3 laminectomy. New posterior thecal cyst (pseudomeningocele, arachnoid adhesions or hygroma). Mass effect with anterior displacement of the cauda equina. 3. Epidural tumor (and retropulsed bony fragments at L3) resulting in moderate L2-3 and L3-4 canal stenosis. 4. Neural foraminal narrowing L1-2 through L4-5 moderate on the LEFT at L3-4 in part due to tumor. Electronically Signed   By: Elon Alas M.D.   On: 10/16/2018 15:17    Review of Systems  Musculoskeletal: Positive for back pain.  Neurological: Positive for tingling and focal weakness.   Blood pressure (!) 151/90, pulse 86, temperature 98 F (36.7 C), temperature source Oral, resp. rate 18, height 5' 11" (1.803 m), weight 85.9 kg, SpO2 97 %. Physical Exam  Neurological: GCS eye subscore is 4. GCS verbal subscore is 5. GCS motor subscore is 6.  Strength out of 5 iliopsoas, quads, hamstrings 4+ out of 5 bilateral foot drops at 4 out of 5    Assessment/Plan: 77 showed Gen. With significant progression of known prostate cancer. Patient improved on IV steroids will continue to work with physical therapy continue steroids and await oncology evaluation and determination of whether the patient would be a candidate for or benefit from increased radiation treatment or surgical decompression debulking.  Minh Roanhorse P 10/17/2018, 10:32 AM

## 2018-10-17 NOTE — Progress Notes (Signed)
Pt states meds are effective for pain control. Pt verbalized understanding plan of care. Pt has own chemo med supplied which he is taking. Pt stable. No acute distress noted. Call light within reach. Will continue to monitor.

## 2018-10-17 NOTE — Progress Notes (Signed)
Per Joelene Millin, NP (neurosurgey on Call) ok for patient to have brace off while in bed

## 2018-10-17 NOTE — Progress Notes (Signed)
Per [employer] , ok to leave brace off when in bed and have it on at all times when ambulating

## 2018-10-17 NOTE — Progress Notes (Signed)
Events in the last 24 hours noted.  Patient known to me with a history of advanced prostate cancer and is currently on on Zytiga that was started in September 2019.  He was hospitalized on October 16, 2018 with increased back pain over the last 24 hours.  MRI of the spine obtained showed a severe L3 pathological fracture that is potentially acute with new L1 osseous metastasis.  The dural tumor was also noted with stenosis between L2 and L3 and L3 and L4.  Clinically, Mr. Sellin have improved overnight with steroids without any neurological deficits.  He does report lower extremity edema but no other complaints.  He was awake alert without any distress.  His exam did not show any neurological deficits.  His disease status was discussed today and future treatment options were reviewed with him and his family.  Although he has incurable malignancy, additional treatment options still exist at this time.  These were include systemic chemotherapy among other agents.  Has performance status remain excellent and aggressive therapy is warranted.  Despite his disease progression, I have no objections to proceeding with surgical decompression if it offers him the best local disease control.  If surgery is not an option, that radiation would be his best bet moving forward.  This was discussed with the patient today and he is in agreement.  25  minutes was spent with the patient face-to-face today.  More than 50% of time was dedicated to reviewing his disease status, imaging studies and treatment options.

## 2018-10-17 NOTE — Progress Notes (Signed)
Orthopedic Tech Progress Note Patient Details:  [name] 12-01-1941 [id number]  Patient ID: [name], male   DOB: 1941-12-10, 77 y.o.   MRN: [id number]   [name] 10/17/2018, 10:11 AM Called in [employer] brace order; spoke with Bella Kennedy

## 2018-10-18 ENCOUNTER — Encounter (HOSPITAL_COMMUNITY): Payer: Self-pay | Admitting: *Deleted

## 2018-10-18 ENCOUNTER — Telehealth: Payer: Self-pay | Admitting: Radiation Therapy

## 2018-10-18 DIAGNOSIS — M5440 Lumbago with sciatica, unspecified side: Secondary | ICD-10-CM | POA: Diagnosis not present

## 2018-10-18 DIAGNOSIS — Z96643 Presence of artificial hip joint, bilateral: Secondary | ICD-10-CM | POA: Diagnosis present

## 2018-10-18 DIAGNOSIS — K2270 Barrett's esophagus without dysplasia: Secondary | ICD-10-CM | POA: Diagnosis present

## 2018-10-18 DIAGNOSIS — L89151 Pressure ulcer of sacral region, stage 1: Secondary | ICD-10-CM | POA: Diagnosis present

## 2018-10-18 DIAGNOSIS — E785 Hyperlipidemia, unspecified: Secondary | ICD-10-CM | POA: Diagnosis present

## 2018-10-18 DIAGNOSIS — Z8042 Family history of malignant neoplasm of prostate: Secondary | ICD-10-CM | POA: Diagnosis not present

## 2018-10-18 DIAGNOSIS — C61 Malignant neoplasm of prostate: Secondary | ICD-10-CM | POA: Diagnosis present

## 2018-10-18 DIAGNOSIS — Z800 Family history of malignant neoplasm of digestive organs: Secondary | ICD-10-CM | POA: Diagnosis not present

## 2018-10-18 DIAGNOSIS — Z791 Long term (current) use of non-steroidal anti-inflammatories (NSAID): Secondary | ICD-10-CM | POA: Diagnosis not present

## 2018-10-18 DIAGNOSIS — C775 Secondary and unspecified malignant neoplasm of intrapelvic lymph nodes: Secondary | ICD-10-CM | POA: Diagnosis not present

## 2018-10-18 DIAGNOSIS — Z8719 Personal history of other diseases of the digestive system: Secondary | ICD-10-CM | POA: Diagnosis not present

## 2018-10-18 DIAGNOSIS — M8458XA Pathological fracture in neoplastic disease, other specified site, initial encounter for fracture: Secondary | ICD-10-CM | POA: Diagnosis present

## 2018-10-18 DIAGNOSIS — Z23 Encounter for immunization: Secondary | ICD-10-CM | POA: Diagnosis not present

## 2018-10-18 DIAGNOSIS — M7989 Other specified soft tissue disorders: Secondary | ICD-10-CM | POA: Diagnosis present

## 2018-10-18 DIAGNOSIS — D497 Neoplasm of unspecified behavior of endocrine glands and other parts of nervous system: Secondary | ICD-10-CM | POA: Diagnosis not present

## 2018-10-18 DIAGNOSIS — M48061 Spinal stenosis, lumbar region without neurogenic claudication: Secondary | ICD-10-CM | POA: Diagnosis present

## 2018-10-18 DIAGNOSIS — Z888 Allergy status to other drugs, medicaments and biological substances status: Secondary | ICD-10-CM | POA: Diagnosis not present

## 2018-10-18 DIAGNOSIS — G893 Neoplasm related pain (acute) (chronic): Secondary | ICD-10-CM | POA: Diagnosis not present

## 2018-10-18 DIAGNOSIS — Z923 Personal history of irradiation: Secondary | ICD-10-CM | POA: Diagnosis not present

## 2018-10-18 DIAGNOSIS — Z192 Hormone resistant malignancy status: Secondary | ICD-10-CM | POA: Diagnosis not present

## 2018-10-18 DIAGNOSIS — M549 Dorsalgia, unspecified: Secondary | ICD-10-CM | POA: Diagnosis not present

## 2018-10-18 DIAGNOSIS — S32039A Unspecified fracture of third lumbar vertebra, initial encounter for closed fracture: Secondary | ICD-10-CM | POA: Diagnosis not present

## 2018-10-18 DIAGNOSIS — C7951 Secondary malignant neoplasm of bone: Secondary | ICD-10-CM | POA: Diagnosis present

## 2018-10-18 DIAGNOSIS — Z79899 Other long term (current) drug therapy: Secondary | ICD-10-CM | POA: Diagnosis not present

## 2018-10-18 DIAGNOSIS — I5030 Unspecified diastolic (congestive) heart failure: Secondary | ICD-10-CM | POA: Diagnosis not present

## 2018-10-18 DIAGNOSIS — M8448XA Pathological fracture, other site, initial encounter for fracture: Secondary | ICD-10-CM | POA: Diagnosis not present

## 2018-10-18 DIAGNOSIS — C772 Secondary and unspecified malignant neoplasm of intra-abdominal lymph nodes: Secondary | ICD-10-CM | POA: Diagnosis present

## 2018-10-18 DIAGNOSIS — T380X5A Adverse effect of glucocorticoids and synthetic analogues, initial encounter: Secondary | ICD-10-CM | POA: Diagnosis present

## 2018-10-18 DIAGNOSIS — K219 Gastro-esophageal reflux disease without esophagitis: Secondary | ICD-10-CM | POA: Diagnosis present

## 2018-10-18 DIAGNOSIS — R739 Hyperglycemia, unspecified: Secondary | ICD-10-CM | POA: Diagnosis present

## 2018-10-18 DIAGNOSIS — I10 Essential (primary) hypertension: Secondary | ICD-10-CM | POA: Diagnosis present

## 2018-10-18 DIAGNOSIS — C7949 Secondary malignant neoplasm of other parts of nervous system: Secondary | ICD-10-CM | POA: Diagnosis not present

## 2018-10-18 DIAGNOSIS — N400 Enlarged prostate without lower urinary tract symptoms: Secondary | ICD-10-CM | POA: Diagnosis present

## 2018-10-18 DIAGNOSIS — Z87891 Personal history of nicotine dependence: Secondary | ICD-10-CM | POA: Diagnosis not present

## 2018-10-18 LAB — GLUCOSE, CAPILLARY
GLUCOSE-CAPILLARY: 143 mg/dL — AB (ref 70–99)
Glucose-Capillary: 109 mg/dL — ABNORMAL HIGH (ref 70–99)
Glucose-Capillary: 94 mg/dL (ref 70–99)
Glucose-Capillary: 100 mg/dL — ABNORMAL HIGH (ref 70–99)

## 2018-10-18 NOTE — Progress Notes (Signed)
Patient ID: [name], male   DOB: 05/08/1941, 77 y.o.   MRN: [id number] Patient is doing significantly better on IV Decadron. He has not yet worked with physical therapy. That is scheduled for tomorrow. Neurologically he stable with stable strength.  I have extensively gone over the treatment options with him to include doing nothing proceeding forward with radiation therapy alone, surgery before radiation or radiation before surgery. Surgery would involve an extensive decompressive laminectomy and debulking of epidural tumor possibly removal of part of the posterior aspect of the vertebral body with a screw and rod construct from L2-4. In the setting is patient with known metastases throughout his lumbar spine we don't really know whether there is some involvement already of L2 and L4 I think that surgery although it may help stabilize the segment is at high risk of failure high risk of pseudoarthrosis and certainly high risk of infection. I think is a possibility of doing stereotactic radiosurgery and control in the tumor locally and then we may be left with better options to control stability of his back with a simple kyphoplasty. I think these are better options then absorbing the high complication rate of lumbar surgery at this time. Clinically patient seems to be improving. In my opinion I think were better off to radiate first , in the form of stereotactic radiosurgery,then consider either kyphoplasty or stabilization surgery should we need to for instability after that.I discussed this with radiation oncology and extensively with the patient and family.

## 2018-10-18 NOTE — Progress Notes (Addendum)
[facility] PROGRESS NOTE  [name] SNK:[id number] DOB: September 19, 1941 DOA: 10/16/2018 PCP: [name], MD  77 y.o. 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 [name], currently on Zytiga and Lupron, hyperlipidemia, arthritis, who presents to the hospital with chief complaint of back pain. 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  Neurosurgery was consulted, they recommended steroids and TRH   called to admit. After tumor board meeting and multiple consults , by oncology , rad onc and NS ,patient scheduled to discuss radiation treatment options and complete the simulation procedure for his spinal radiation at [facility] in am    Assessment/Plan:  Metastatic prostate cancer with spine mets/back pain -pain improved with steroids IV, ambulating independently  -Neurosurgery consulted. Discussed with Dr. [name]. According to him patient has received prior radiation therapy to the area of spinal involvement and he is not sure if surgery is in his best interest. Consulted Dr.Kinard with radiation oncology, who will discuss further with neurosurgery Dr [name]  and Dr. [name]  In meantime pain management -Pain control with home regimen for now, -lumbar corsett per dr cram  hold off on PT OT until seen by radiation oncology/neurosurgery today For now patient scheduled to discuss radiation treatment options and complete the simulation procedure for his spinal radiation at [facility] in am   Bilateral lower extremity swelling -Doppler lower extremity ultrasound neg      Hyperlipidemia -Continue home medications  Hypertension. BP high end of normal. No hx of same. Likely related to pain -prn  hydralazine -monitor closely  Hyperglycemia. Likely steroid induced. No hx of diabetes -SSI -monitor  Code Status: full Family Communication: wife and daughter at bedside Disposition Plan: to be determined by radiation oncology/neurosurgery  /oncology   Consultants:  Dr cram neurosurgery  Oncology  Radiation oncology  Procedures:  doppler  Antibiotics:  doxy  HPI/Subjective: Patient reports back pain much improved this am.   Patient admitted with intractable back pain. Work up revealed worsening mets to spine in spite of radiation. Plan for neurosurgery and onc to take case to Tumor committee discuss options/prognosis.   Objective: Vitals:   10/17/18 2044 10/18/18 0525  BP: (!) 143/94 133/90  Pulse: 81 75  Resp: 19 18  Temp: 98.5 F (36.9 C) 97.9 F (36.6 C)  SpO2: 97% 98%    Intake/Output Summary (Last 24 hours) at 10/18/2018 1131 Last data filed at 10/18/2018 0830 Gross per 24 hour  Intake 720 ml  Output -  Net 720 ml   Filed Weights   10/16/18 1200  Weight: 85.9 kg    Exam:   General:  Well nourished slightly pale no acute distress  Cardiovascular: rrr no mgr no LE edema  Respiratory: normal effort BS clear bilaterally no wheeze  Abdomen: soft +BS no guarding or rebounding  Musculoskeletal: lumbar spine tender to touch other joints without swelling/erythema   Data Reviewed: Basic Metabolic Panel: Recent Labs  Lab 10/16/18 1128 10/17/18 0514  NA 137 138  K 3.4* 4.4  CL 105 108  CO2 21* 21*  GLUCOSE 93 170*  BUN 9 13  CREATININE 0.98 0.76  CALCIUM 8.9 8.4*   Liver Function Tests: Recent Labs  Lab 10/17/18 0514  AST 18  ALT 29  ALKPHOS 88  BILITOT 0.6  PROT 5.0*  ALBUMIN 2.5*   No results for input(s): LIPASE, AMYLASE in the last 168 hours. No results for input(s): AMMONIA in the last 168 hours. CBC: Recent Labs  Lab 10/16/18 1128 10/17/18 0514  WBC 5.0 4.6  NEUTROABS 3.8  --   HGB 14.1 11.9*  HCT 43.2 36.8*   MCV 96.6 95.8  PLT 286 288   Cardiac Enzymes: No results for input(s): CKTOTAL, CKMB, CKMBINDEX, TROPONINI in the last 168 hours. BNP (last 3 results) Recent Labs    08/16/18 0951  BNP 104.5*    ProBNP (last 3 results) No results for input(s): PROBNP in the last 8760 hours.  CBG: Recent Labs  Lab 10/17/18 1722 10/17/18 1818 10/17/18 2106 10/18/18 0613  GLUCAP 194* 149* 116* 109*    Recent Results (from the past 240 hour(s))  Culture, blood (routine x 2)     Status: None (Preliminary result)   Collection Time: 10/16/18 12:19 PM  Result Value Ref Range Status   Specimen Description BLOOD LEFT ANTECUBITAL  Final   Special Requests   Final    BOTTLES DRAWN AEROBIC AND ANAEROBIC Blood Culture adequate volume   Culture   Final    NO GROWTH 1 DAY Performed at [facility], [place] [address], [place], [place] [address]    Report Status PENDING  Incomplete  Culture, blood (routine x 2)     Status: None (Preliminary result)   Collection Time: 10/16/18 12:24 PM  Result Value Ref Range Status   Specimen Description BLOOD LEFT HAND  Final   Special Requests   Final    BOTTLES DRAWN AEROBIC AND ANAEROBIC Blood Culture results may not be optimal due to an inadequate volume of blood received in culture bottles   Culture   Final    NO GROWTH 1 DAY Performed at [facility], [place] [address] [place], [place] [address]    Report Status PENDING  Incomplete     Studies: Mr Lumbar Spine W Wo Contrast  Result Date: 10/16/2018 CLINICAL DATA:  Back pain radiating to RIGHT hip and leg. History of metastatic prostate cancer, L3-4 laminectomy August 2019. EXAM: MRI LUMBAR SPINE WITHOUT AND WITH CONTRAST TECHNIQUE: Multiplanar and multiecho pulse sequences of the lumbar spine were obtained without and with intravenous contrast. CONTRAST:  10 cc Gadavist COMPARISON:  MRI lumbar spine June 24, 2018 FINDINGS: SEGMENTATION: For the purposes of this report, the last well-formed  intervertebral disc is reported as L5-S1. ALIGNMENT: Maintained lumbar lordosis. Minimal grade 1 anterolisthesis. VERTEBRAE:Severe at L3-1 burst fracture new from prior examination with greater than 75% central height loss, diffusely low T1, heterogeneously bright STIR signal with enhancement. New enhancing low signal 22 mm metastasis RIGHT L1 middle column extending to RIGHT pedicle. Old moderate to severe L5 compression fracture. Diffusely bright T1 bone marrow signal compatible with post radiation change in osteopenia. Moderate L1-2 disc height loss, remaining disc morphology is maintained with diffuse desiccation and superimposed mild disc edema L2-3 through L4-5. new LEFT L2-3 hemilaminectomy with subcentimeter fluid collection within surgical bed, likely seroma without suspicious enhancement. CONUS MEDULLARIS AND CAUDA EQUINA: Conus medullaris terminates at L1-2 and demonstrates normal morphology and signal characteristics though slightly displaced to the RIGHT. New nonenhancing expansile fluid signal posterior thecal sac from the included thoracic spine to the level of L3-4, anteriorly displacing the cauda equina. No abnormal cord or leptomeningeal enhancement. PARASPINAL AND OTHER SOFT TISSUES: Worsening prevertebral/retroperitoneal lymphadenopathy L1, similar at L3 and L5. Ventral tumor at L2-3.  Postoperative LEFT multifidus denervation at and below the level of surgical intervention. Mildly edematous iliopsoas muscles worse than prior study. T2 bright cyst bilateral kidneys. DISC LEVELS: T12-L1: No disc bulge, canal stenosis nor neural foraminal narrowing. L1-2: Small broad-based disc bulge, small LEFT extraforaminal disc protrusion no canal stenosis. Mild LEFT neural foraminal narrowing. L2-3: Anterolisthesis. LEFT laminectomy. Mild RIGHT facet arthropathy. Tumoral extension and retro pulsed bony fragments extending into ventral epidural space narrowing the canal to 7 mm. Tumor within the medial LEFT  neural foramen resulting in moderate stenosis. Mild to moderate stenosis RIGHT neural foramen. L3-4: Small broad-based disc bulge and minimal ventral epidural tumor, mild facet arthropathy and ligamentum flavum redundancy. Moderate canal stenosis. Tumor within cephalad neural foramen. Mild RIGHT and moderate LEFT neural foraminal narrowing. L4-5: Endplate spurring. Mild facet arthropathy and ligamentum flavum redundancy. Mild canal stenosis. Mild bilateral neural foraminal narrowing. L5-S1: No disc bulge, canal stenosis nor neural foraminal narrowing. Mild facet arthropathy. IMPRESSION: 1. New severe L3 pathologic fracture, potentially acute. New L1 osseous metastasis. Worsening retroperitoneal nodal metastasis. 2. Status post LEFT L2-3 laminectomy. New posterior thecal cyst (pseudomeningocele, arachnoid adhesions or hygroma). Mass effect with anterior displacement of the cauda equina. 3. Epidural tumor (and retropulsed bony fragments at L3) resulting in moderate L2-3 and L3-4 canal stenosis. 4. Neural foraminal narrowing L1-2 through L4-5 moderate on the LEFT at L3-4 in part due to tumor. Electronically Signed   By: Elon Alas M.D.   On: 10/16/2018 15:17   Vas [place] Lower Extremity Venous (dvt)  Result Date: 10/17/2018  Lower Venous Study Indications: Edema. Bilateral lower extremity for the past thee weeks.  Performing Technologist: Toma Copier RVS  Examination Guidelines: A complete evaluation includes B-mode imaging, spectral Doppler, color Doppler, and power Doppler as needed of all accessible portions of each vessel. Bilateral testing is considered an integral part of a complete examination. Limited examinations for reoccurring indications may be performed as noted.  Right Venous Findings: +---------+---------------+---------+-----------+----------+-------+          CompressibilityPhasicitySpontaneityPropertiesSummary +---------+---------------+---------+-----------+----------+-------+  CFV      Full           Yes      Yes                          +---------+---------------+---------+-----------+----------+-------+ SFJ      Full                                                 +---------+---------------+---------+-----------+----------+-------+ FV Prox  Full           Yes      Yes                          +---------+---------------+---------+-----------+----------+-------+ FV Mid   Full                                                 +---------+---------------+---------+-----------+----------+-------+ FV DistalFull           Yes      Yes                          +---------+---------------+---------+-----------+----------+-------+  PFV      Full           Yes      Yes                          +---------+---------------+---------+-----------+----------+-------+ POP      Full           Yes      Yes                          +---------+---------------+---------+-----------+----------+-------+ PTV      Full                                                 +---------+---------------+---------+-----------+----------+-------+ PERO     Full                                                 +---------+---------------+---------+-----------+----------+-------+  Right Technical Findings: Technically difficult to image the lower leg due to edema  Left Venous Findings: +---------+---------------+---------+-----------+----------+-------+          CompressibilityPhasicitySpontaneityPropertiesSummary +---------+---------------+---------+-----------+----------+-------+ CFV      Full           Yes      Yes                          +---------+---------------+---------+-----------+----------+-------+ SFJ      Full                                                 +---------+---------------+---------+-----------+----------+-------+ FV Prox  Full           Yes      Yes                           +---------+---------------+---------+-----------+----------+-------+ FV Mid   Full                                                 +---------+---------------+---------+-----------+----------+-------+ FV DistalFull           Yes      Yes                          +---------+---------------+---------+-----------+----------+-------+ PFV      Full           Yes      Yes                          +---------+---------------+---------+-----------+----------+-------+ POP      Full           Yes      Yes                          +---------+---------------+---------+-----------+----------+-------+ PTV  Full                                                 +---------+---------------+---------+-----------+----------+-------+ PERO     Full                                                 +---------+---------------+---------+-----------+----------+-------+  Left Technical Findings: Technically difficult to image the lower leg due to edema   Summary: Right: There is no evidence of deep vein thrombosis in the lower extremity. No cystic structure found in the popliteal fossa. Left: There is no evidence of deep vein thrombosis in the lower extremity. No cystic structure found in the popliteal fossa.  *See table(s) above for measurements and observations. Electronically signed by Curt Jews MD on 10/17/2018 at 3:36:29 PM.    Final     Scheduled Meds: . abiraterone acetate  1,000 mg Oral Daily  . cycloSPORINE  1 drop Both Eyes BID  . dexamethasone  4 mg Intravenous Q6H  . docusate sodium  100 mg Oral BID  . doxycycline  100 mg Oral Q12H  . ezetimibe  10 mg Oral q morning - 10a  . Influenza vac split quadrivalent PF  0.5 mL Intramuscular Tomorrow-1000  . insulin aspart  0-5 Units Subcutaneous QHS  . insulin aspart  0-9 Units Subcutaneous TID WC  . pantoprazole  40 mg Oral QPC supper  . tamsulosin  0.4 mg Oral Daily   Continuous Infusions:  Principal Problem:   Back  pain Active Problems:   Prostate cancer metastatic to bone (HCC)   Pressure injury of skin   Swelling of lower extremity   Hyperglycemia    Time spent: 40 minutes    Reyne Dumas NP [facility]  If 7PM-7AM, please contact night-coverage at [email or web address], password [facility] 10/18/2018, 11:31 AM  LOS: 0 days

## 2018-10-18 NOTE — Plan of Care (Signed)
  Problem: Activity: Goal: Risk for activity intolerance will decrease Outcome: Progressing   Problem: Pain Managment: Goal: General experience of comfort will improve Outcome: Progressing   Problem: Safety: Goal: Ability to remain free from injury will improve Outcome: Progressing   

## 2018-10-18 NOTE — Telephone Encounter (Signed)
Spoke with IP nurse, Sharyn Lull, about having [name] brought over to the Radiation Oncology department via [place] 11/1 @ 9:00. We plan to discuss his radiation treatment options and complete the simulation procedure for his spinal radiation.   Mont Dutton R.T.(R)(T) Special Procedures Navigator

## 2018-10-18 NOTE — Progress Notes (Signed)
This RN called [facility] and set up transport for pt's appointment at [place] tomorrow over at [facility]. [facility] has set pick up time for 8am and return back to floor after appointment. Pt, wife and daughter informed.

## 2018-10-19 ENCOUNTER — Other Ambulatory Visit (HOSPITAL_COMMUNITY): Payer: Medicare Other

## 2018-10-19 ENCOUNTER — Ambulatory Visit
Admit: 2018-10-19 | Discharge: 2018-10-19 | Disposition: A | Discharge status: Home / Self Care | Payer: Medicare Other | Source: Ambulatory Visit | Attending: Radiation Oncology | Admitting: Radiation Oncology

## 2018-10-19 ENCOUNTER — Ambulatory Visit
Admit: 2018-10-19 | Discharge: 2018-10-19 | Disposition: A | Discharge status: Home / Self Care | Payer: Medicare Other | Attending: Urology | Admitting: Urology

## 2018-10-19 ENCOUNTER — Inpatient Hospital Stay (HOSPITAL_COMMUNITY): Payer: Medicare Other

## 2018-10-19 ENCOUNTER — Other Ambulatory Visit: Payer: Self-pay

## 2018-10-19 ENCOUNTER — Encounter: Payer: Self-pay | Admitting: Urology

## 2018-10-19 VITALS — BP 170/97 | HR 104 | Temp 97.9°F | Resp 20 | Ht 71.0 in | Wt 189.0 lb

## 2018-10-19 DIAGNOSIS — C7951 Secondary malignant neoplasm of bone: Secondary | ICD-10-CM

## 2018-10-19 DIAGNOSIS — Z87891 Personal history of nicotine dependence: Secondary | ICD-10-CM | POA: Insufficient documentation

## 2018-10-19 DIAGNOSIS — I5030 Unspecified diastolic (congestive) heart failure: Secondary | ICD-10-CM

## 2018-10-19 DIAGNOSIS — M7989 Other specified soft tissue disorders: Secondary | ICD-10-CM

## 2018-10-19 DIAGNOSIS — C61 Malignant neoplasm of prostate: Secondary | ICD-10-CM

## 2018-10-19 DIAGNOSIS — C7949 Secondary malignant neoplasm of other parts of nervous system: Secondary | ICD-10-CM

## 2018-10-19 DIAGNOSIS — S32039A Unspecified fracture of third lumbar vertebra, initial encounter for closed fracture: Secondary | ICD-10-CM

## 2018-10-19 DIAGNOSIS — Z510 Encounter for antineoplastic radiation therapy: Secondary | ICD-10-CM | POA: Insufficient documentation

## 2018-10-19 DIAGNOSIS — C772 Secondary and unspecified malignant neoplasm of intra-abdominal lymph nodes: Secondary | ICD-10-CM | POA: Insufficient documentation

## 2018-10-19 DIAGNOSIS — M5440 Lumbago with sciatica, unspecified side: Secondary | ICD-10-CM

## 2018-10-19 LAB — GLUCOSE, CAPILLARY
GLUCOSE-CAPILLARY: 106 mg/dL — AB (ref 70–99)
GLUCOSE-CAPILLARY: 71 mg/dL (ref 70–99)
Glucose-Capillary: 121 mg/dL — ABNORMAL HIGH (ref 70–99)
Glucose-Capillary: 123 mg/dL — ABNORMAL HIGH (ref 70–99)

## 2018-10-19 LAB — ECHOCARDIOGRAM COMPLETE
Height: 71 in
Weight: 3024 oz

## 2018-10-19 MED ORDER — FUROSEMIDE 40 MG PO TABS
40.0000 mg | ORAL_TABLET | Freq: Once | ORAL | Status: AC
  Administered 2018-10-19: 40 mg via ORAL
  Filled 2018-10-19: qty 1

## 2018-10-19 MED ORDER — HEPARIN SODIUM (PORCINE) 5000 UNIT/ML IJ SOLN
5000.0000 [IU] | Freq: Three times a day (TID) | INTRAMUSCULAR | Status: DC
  Administered 2018-10-19 – 2018-10-20 (×3): 5000 [IU] via SUBCUTANEOUS
  Filled 2018-10-19 (×3): qty 1

## 2018-10-19 MED ORDER — AMLODIPINE BESYLATE 10 MG PO TABS
10.0000 mg | ORAL_TABLET | Freq: Every day | ORAL | Status: DC
  Administered 2018-10-19 – 2018-10-20 (×2): 10 mg via ORAL
  Filled 2018-10-19 (×2): qty 1

## 2018-10-19 MED FILL — ZYTIGA 250 MG TABLET: 250 | 30 days supply | Qty: 120 | Fill #0

## 2018-10-19 NOTE — Progress Notes (Signed)
NEUROSURGERY PROGRESS NOTE  Doing well. No complaints of any back or leg pain right now. He thinks that the steroids have helped that. Good strength throughout. Did very well with PT today  Temp:  [97.9 F (36.6 C)-98.9 F (37.2 C)] 97.9 F (36.6 C) (11/01 0936) Pulse Rate:  [70-104] 72 (11/01 1203) Resp:  [16-20] 20 (11/01 0936) BP: (133-170)/(86-97) 133/91 (11/01 1203) SpO2:  [97 %-99 %] 98 % (11/01 0936) Weight:  [85.7 kg] 85.7 kg (11/01 0936)    Eleonore Chiquito, NP 10/19/2018 3:13 PM

## 2018-10-19 NOTE — Progress Notes (Signed)
PT Cancellation Note  Patient Details Name: [name] MRN: [id number] DOB: 08-Dec-1941   Cancelled Treatment:    Reason Eval/Treat Not Completed: (P) Patient at procedure or test/unavailable Pt was taken to [facility] for procedure. PT to follow back this afternoon as able for Evaluation.  Sheria Rosello B. Migdalia Dk PT, DPT Acute Rehabilitation Services Pager [phone] Office [phone]    [place] 10/19/2018, 9:53 AM

## 2018-10-19 NOTE — Progress Notes (Addendum)
[facility] PROGRESS NOTE  [name] TMH:[id number] DOB: Aug 01, 1941 DOA: 10/16/2018 PCP: [name], MD  77 y.o. 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 [name], currently on Zytiga and Lupron, hyperlipidemia, arthritis, who presents to the hospital with chief complaint of back pain. 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  Neurosurgery was consulted, they recommended steroids and TRH   called to admit. After tumor board meeting and multiple consults , by oncology , rad onc and NS ,patient scheduled to discuss radiation treatment options and complete the simulation procedure for his spinal radiation at [facility] in am   HPI/Subjective:  Patient in bed, appears comfortable, denies any headache, no fever, no chest pain or pressure, no shortness of breath , no abdominal pain. No focal weakness, continues to have mid to low back pain.    Assessment/Plan:  Metastatic prostate cancer with spine mets/back pain due to New severe L3 pathologic fracture, potentially acute. New L1 osseous metastasis.- patient feels better, continue present pain control and steroids, neurosurgery and radiation oncology have been involved, and is to manage the patient nonoperatively, tentatively radiation treatments to start on 10/19/2018, will initiate PT OT this evening after radiation treatment.  No signs of any lower extremity weakness or incontinence.  Bilateral lower extremity swelling  - DVT, ruled out with ultrasound, compression stockings and diuresis initiated.  Check echocardiogram.   Hyperlipidemia  - Continue home medications  Hypertension. He is on Norvasc, as needed hydralazine to continue.  BPH.  On  Flomax.    GERD.  On PPI continue.    Hyperglycemia. Likely steroid induced. No hx of diabetes, SSI  CBG (last 3)  Recent Labs    10/18/18 1712 10/18/18 2200 10/19/18 0638  GLUCAP 143* 100* 106*     Code Status: full Family Communication: wife  bedside Disposition Plan: to be determined by radiation oncology/neurosurgery  /oncology   Consultants:  Dr cram neurosurgery  Oncology  Radiation oncology  Procedures: Lower Extremity venous duplex.  Unremarkable.  Antibiotics:      Objective:  Blood pressure (!) 141/86, pulse 70, temperature 98.3 F (36.8 C), temperature source Oral, resp. rate 16, height 5\' 11"  (1.803 m), weight 85.9 kg, SpO2 99 %.  Exam:  Awake Alert, Oriented X 3, No new F.N deficits, Normal affect [place].AT,PERRAL Supple Neck,No JVD, No cervical lymphadenopathy appriciated.  Symmetrical Chest wall movement, Good air movement bilaterally, CTAB RRR,No Gallops, Rubs or new Murmurs, No Parasternal Heave +ve B.Sounds, Abd Soft, No tenderness, No organomegaly appriciated, No rebound - guarding or rigidity. No Cyanosis, Clubbing or edema, No new Rash or bruise   Data Reviewed: Basic Metabolic Panel: Recent Labs  Lab 10/16/18 1128 10/17/18 0514  NA 137 138  K 3.4* 4.4  CL 105 108  CO2 21* 21*  GLUCOSE 93 170*  BUN 9 13  CREATININE 0.98 0.76  CALCIUM 8.9 8.4*   Liver Function Tests: Recent Labs  Lab 10/17/18 0514  AST 18  ALT 29  ALKPHOS 88  BILITOT 0.6  PROT 5.0*  ALBUMIN 2.5*   No results for input(s): LIPASE, AMYLASE in the last 168 hours. No results for input(s): AMMONIA in the last 168 hours. CBC: Recent Labs  Lab 10/16/18 1128 10/17/18 0514  WBC 5.0 4.6  NEUTROABS  3.8  --   HGB 14.1 11.9*  HCT 43.2 36.8*  MCV 96.6 95.8  PLT 286 288   Cardiac Enzymes: No results for input(s): CKTOTAL, CKMB, CKMBINDEX, TROPONINI in the last 168 hours. BNP (last 3 results) Recent Labs    08/16/18 0951  BNP 104.5*    ProBNP (last  3 results) No results for input(s): PROBNP in the last 8760 hours.  CBG: Recent Labs  Lab 10/18/18 0613 10/18/18 1228 10/18/18 1712 10/18/18 2200 10/19/18 0638  GLUCAP 109* 94 143* 100* 106*    Recent Results (from the past 240 hour(s))  Culture, blood (routine x 2)     Status: None (Preliminary result)   Collection Time: 10/16/18 12:19 PM  Result Value Ref Range Status   Specimen Description BLOOD LEFT ANTECUBITAL  Final   Special Requests   Final    BOTTLES DRAWN AEROBIC AND ANAEROBIC Blood Culture adequate volume   Culture   Final    NO GROWTH 2 DAYS Performed at [facility], [place] [address] [place], [place] [address]    Report Status PENDING  Incomplete  Culture, blood (routine x 2)     Status: None (Preliminary result)   Collection Time: 10/16/18 12:24 PM  Result Value Ref Range Status   Specimen Description BLOOD LEFT HAND  Final   Special Requests   Final    BOTTLES DRAWN AEROBIC AND ANAEROBIC Blood Culture results may not be optimal due to an inadequate volume of blood received in culture bottles   Culture   Final    NO GROWTH 2 DAYS Performed at [facility], [place] [address], [place], [place] [address]    Report Status PENDING  Incomplete     Studies: Vas [place] Lower Extremity Venous (dvt)  Result Date: 10/17/2018  Lower Venous Study Indications: Edema. Bilateral lower extremity for the past thee weeks.  Performing Technologist: Toma Copier RVS  Examination Guidelines: A complete evaluation includes B-mode imaging, spectral Doppler, color Doppler, and power Doppler as needed of all accessible portions of each vessel. Bilateral testing is considered an integral part of a complete examination. Limited examinations for reoccurring indications may be performed as noted.  Right Venous Findings: +---------+---------------+---------+-----------+----------+-------+          CompressibilityPhasicitySpontaneityPropertiesSummary  +---------+---------------+---------+-----------+----------+-------+ CFV      Full           Yes      Yes                          +---------+---------------+---------+-----------+----------+-------+ SFJ      Full                                                 +---------+---------------+---------+-----------+----------+-------+ FV Prox  Full           Yes      Yes                          +---------+---------------+---------+-----------+----------+-------+ FV Mid   Full                                                 +---------+---------------+---------+-----------+----------+-------+ FV  DistalFull           Yes      Yes                          +---------+---------------+---------+-----------+----------+-------+ PFV      Full           Yes      Yes                          +---------+---------------+---------+-----------+----------+-------+ POP      Full           Yes      Yes                          +---------+---------------+---------+-----------+----------+-------+ PTV      Full                                                 +---------+---------------+---------+-----------+----------+-------+ PERO     Full                                                 +---------+---------------+---------+-----------+----------+-------+  Right Technical Findings: Technically difficult to image the lower leg due to edema  Left Venous Findings: +---------+---------------+---------+-----------+----------+-------+          CompressibilityPhasicitySpontaneityPropertiesSummary +---------+---------------+---------+-----------+----------+-------+ CFV      Full           Yes      Yes                          +---------+---------------+---------+-----------+----------+-------+ SFJ      Full                                                 +---------+---------------+---------+-----------+----------+-------+ FV Prox  Full           Yes      Yes                           +---------+---------------+---------+-----------+----------+-------+ FV Mid   Full                                                 +---------+---------------+---------+-----------+----------+-------+ FV DistalFull           Yes      Yes                          +---------+---------------+---------+-----------+----------+-------+ PFV      Full           Yes      Yes                          +---------+---------------+---------+-----------+----------+-------+ POP      Full  Yes      Yes                          +---------+---------------+---------+-----------+----------+-------+ PTV      Full                                                 +---------+---------------+---------+-----------+----------+-------+ PERO     Full                                                 +---------+---------------+---------+-----------+----------+-------+  Left Technical Findings: Technically difficult to image the lower leg due to edema   Summary: Right: There is no evidence of deep vein thrombosis in the lower extremity. No cystic structure found in the popliteal fossa. Left: There is no evidence of deep vein thrombosis in the lower extremity. No cystic structure found in the popliteal fossa.  *See table(s) above for measurements and observations. Electronically signed by Curt Jews MD on 10/17/2018 at 3:36:29 PM.    Final     Scheduled Meds: . abiraterone acetate  1,000 mg Oral Daily  . cycloSPORINE  1 drop Both Eyes BID  . dexamethasone  4 mg Intravenous Q6H  . docusate sodium  100 mg Oral BID  . doxycycline  100 mg Oral Q12H  . ezetimibe  10 mg Oral q morning - 10a  . Influenza vac split quadrivalent PF  0.5 mL Intramuscular Tomorrow-1000  . insulin aspart  0-5 Units Subcutaneous QHS  . insulin aspart  0-9 Units Subcutaneous TID WC  . pantoprazole  40 mg Oral QPC supper  . tamsulosin  0.4 mg Oral Daily   Continuous Infusions:   Time spent: 25  minutes  Signature  Lala Lund M.D on 10/19/2018 at 10:30 AM   -  To page go to [email or web address] - password [facility]

## 2018-10-19 NOTE — Progress Notes (Signed)
  Echocardiogram 2D Echocardiogram has been performed.  [name] 10/19/2018, 4:46 PM

## 2018-10-19 NOTE — Progress Notes (Signed)
OT Cancellation Note:    10/18/18 1029  OT Visit Information  Last OT Received On 10/19/18  Reason Eval/Treat Not Completed Patient at procedure or test/ unavailable (procedure at [facility])  Restrictions  Weight Bearing Restrictions No   Hulda Humphrey OTR/L Acute Rehabilitation Services Pager: [phone] Office: [phone]

## 2018-10-19 NOTE — Progress Notes (Signed)
Pt transported to [facility] via [facility]  

## 2018-10-19 NOTE — Evaluation (Signed)
Physical Therapy Evaluation Patient Details Name: [name] MRN: [id number] DOB: 1941-03-14 Today's Date: 10/19/2018   History of Present Illness  77 y.o. 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 Wynetta Emery, currently on Zytiga and Lupron, hyperlipidemia, arthritis, who presents to the hospital with chief complaint of back pain MRI of the spine obtained showed a severe L3 pathological fracture that is potentially acute with new L1 osseous metastasis.  Clinical Impression  PTA pt independent with household mobility with RW and requiring assist only for reaching back for bathing. Pt currently limited in safe mobility by decreased strength and LBP. Pt requires min guard for bed mobility, min A for transfers and min guard for ambulation with RW. PT recommends HHPT level rehab at discharge to improve strength and safety with mobility. Pt has had bad experience with HHPT and would like to use a different agency. Pt's wife will let Case Manager know which agency they had before. PT will continue to follow acutely.    Follow Up Recommendations Home health PT;Supervision/Assistance - 24 hour    Equipment Recommendations  None recommended by PT    Recommendations for Other Services       Precautions / Restrictions Precautions Precautions: Back Required Braces or Orthoses: Other Brace/Splint(lumbar corset applied in seated) Restrictions Weight Bearing Restrictions: No      Mobility  Bed Mobility Overal bed mobility: Needs Assistance Bed Mobility: Supine to Sit     Supine to sit: Min guard     General bed mobility comments: min guard for safety, pt does not utilize log rolling techni  Transfers Overall transfer level: Needs assistance Equipment used: Rolling walker (2 wheeled) Transfers: Sit to/from Stand Sit to Stand: From elevated surface;Min assist         General  transfer comment: minA for safety, pt with increased forward flexion to powerup into standing and get feet underneath him  Ambulation/Gait Ambulation/Gait assistance: Min guard Gait Distance (Feet): 400 Feet Assistive device: Rolling walker (2 wheeled) Gait Pattern/deviations: Step-through pattern;Decreased dorsiflexion - left;Decreased dorsiflexion - right Gait velocity: slowed Gait velocity interpretation: 1.31 - 2.62 ft/sec, indicative of limited community ambulator General Gait Details: min guard for safety, pt with bilateral foot drop requiring increased knee flexion, pt utilizes knee hyperextension for stability as he fatigues        Balance Overall balance assessment: Mild deficits observed, not formally tested                                           Pertinent Vitals/Pain Pain Assessment: 0-10 Pain Score: 4  Pain Location: Low Back Pain Descriptors / Indicators: Aching;Constant;Sore Pain Intervention(s): Limited activity within patient's tolerance;Monitored during session;Repositioned    Home Living Family/patient expects to be discharged to:: Private residence Living Arrangements: Spouse/significant other Available Help at Discharge: Family;Available 24 hours/day Type of Home: House Home Access: Stairs to enter Entrance Stairs-Rails: Right;Left;Can reach both Entrance Stairs-Number of Steps: 2 Home Layout: One level Home Equipment: Walker - 2 wheels;Cane - single point;Wheelchair - manual;Bedside commode;Grab bars - tub/shower;Grab bars - toilet      Prior Function Level of Independence: Needs assistance   Gait / Transfers Assistance Needed: ambulating with cane short distances due to pain; recently purchased a wheelchair for longer distances with increased back pain  ADL's / Homemaking  Assistance Needed: wife assisting with shower but otherwise independent  Comments: limit by standing tolerance     Hand Dominance        Extremity/Trunk  Assessment   Upper Extremity Assessment Upper Extremity Assessment: Overall WFL for tasks assessed    Lower Extremity Assessment Lower Extremity Assessment: RLE deficits/detail;LLE deficits/detail RLE Deficits / Details: PROM WFL, hip strength grossly 4/5, knee flex/ext 4+/5, ankle dorsi 2+/5, ankle plantar 4/5 RLE Sensation: decreased light touch(sensation decreased distally) LLE Deficits / Details: PROM WFL, hip strength grossly 4/5, knee flex/ext 4+/5, ankle dorsi 2+/5, ankle plantar 4/5 LLE Sensation: decreased light touch(sensation decreased distally)    Cervical / Trunk Assessment Cervical / Trunk Assessment: Kyphotic  Communication   Communication: No difficulties  Cognition Arousal/Alertness: Awake/alert Behavior During Therapy: WFL for tasks assessed/performed Overall Cognitive Status: Within Functional Limits for tasks assessed                                        General Comments General comments (skin integrity, edema, etc.): Pt with increased edema in bilateral LE, which decrease ankle mobility in addition to decreased strength    Exercises General Exercises - Lower Extremity Ankle Circles/Pumps: AROM;Both;Seated Long Arc Quad: AROM;Both;10 reps;Seated   Assessment/Plan    PT Assessment Patient needs continued PT services  PT Problem List Decreased strength;Decreased activity tolerance;Decreased range of motion;Decreased balance;Decreased mobility;Pain       PT Treatment Interventions DME instruction;Gait training;Stair training;Functional mobility training;Therapeutic activities;Therapeutic exercise;Balance training;Patient/family education    PT Goals (Current goals can be found in the Care Plan section)  Acute Rehab PT Goals Patient Stated Goal: go home soon PT Goal Formulation: With patient Time For Goal Achievement: 10/19/18 Potential to Achieve Goals: Fair    Frequency Min 4X/week    AM-PAC PT "6 Clicks" Daily Activity  Outcome  Measure Difficulty turning over in bed (including adjusting bedclothes, sheets and blankets)?: Unable Difficulty moving from lying on back to sitting on the side of the bed? : Unable Difficulty sitting down on and standing up from a chair with arms (e.g., wheelchair, bedside commode, etc,.)?: Unable Help needed moving to and from a bed to chair (including a wheelchair)?: A Little Help needed walking in hospital room?: A Little Help needed climbing 3-5 steps with a railing? : A Little 6 Click Score: 12    End of Session Equipment Utilized During Treatment: Back brace;Gait belt Activity Tolerance: Patient tolerated treatment well Patient left: in chair;with call bell/phone within reach;with chair alarm set Nurse Communication: Mobility status PT Visit Diagnosis: Other abnormalities of gait and mobility (R26.89);Muscle weakness (generalized) (M62.81);Difficulty in walking, not elsewhere classified (R26.2);Pain Pain - part of body: (low back)    Time: 1420-1503 PT Time Calculation (min) (ACUTE ONLY): 43 min   Charges:   PT Evaluation $PT Eval Moderate Complexity: 1 Mod PT Treatments $Gait Training: 8-22 mins $Therapeutic Exercise: 8-22 mins        Feliza Diven B. Beverely Risen PT, DPT Acute Rehabilitation Services Pager [phone] Office [phone]   Elon Alas Fleet 10/19/2018, 4:27 PM

## 2018-10-19 NOTE — Progress Notes (Signed)
Radiation Oncology         [phone] ________________________________  Outpatient Re-Consultation  Name: [name] MRN: [id number]  Date of Service: 10/19/2018 DOB: [birth year]  VP:XTGGY, Thayer Jew, MD  [name], MD   REFERRING PHYSICIAN: [name], MD Dr. Kary Kos  DIAGNOSIS: 77 y.o. 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 Prostate cancer ([place]) C61   2. 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 Metastasis of neoplasm to spinal canal [facility]) C79.49     HISTORY OF PRESENT ILLNESS: [name] is a 77 y.o. male seen at the request of Dr. 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   In summary, he was initially diagnosed with Gleason 5+4 adenocarcinoma of the prostate with a PSA of 13.66 in July 2016 under the care of Dr. [name].  He elected to proceed with a 5-week course of external beam radiotherapy followed by brachytherapy boost at [facility] with Dr. [name] between 11/05/15 - 12/2015 in combination with 2 years of androgen deprivation therapy.  PSA nadired at 1.0 in 07/2016. 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  Metastatic work-up at that time showed bone involvement as well as lymphadenopathy.  He was treated under the care of Dr. [name] [facility], initially with enzalutamide, which was started in May 2018 and discontinued in January 2019 when he presented with progression of disease with increased bony metastasis, adrenal nodule and worsening lymphadenopathy.  At that time, his treatment was changed to Taxotere chemotherapy with his last treatment in July 2019.  Repeat systemic imaging study showed a partial response with slight improvement in the bulky retroperitoneal lymphadenopathy but worsening bony metastasis based on a lumbar spine MRI obtained on June 24, 2018.  The MRI showed a complete replacement of L3 vertebral body with tumor in the ventral epidural space behind L3, greater to the left.  PSA on July 18, 2018 was 21.57 which had increased from 19.8 in July 3 and 14.98 on May 30, 2018.  He recently elected to transfer his care to Dr. [name] at the [facility] since this is closer to home. He has continued on androgen deprivation therapy with Lupron under the care of Dr. [name] with his last 50-month injection given in August 2019.  He has also been receiving denosumab Delton See) every 6 weeks with his next injection scheduled for 10/2018.    He had a repeat CT C/A/P on 07/23/2018 which demonstrated persistent lymphadenopathy and no significant progression of his bony disease as compared to imaging from July 2019 but with interval progression of the adrenal metastasis.  Therefore, [name] was added to his treatment regimen on 08/28/2018.  At the time of consult visit with Dr. [name] on 07/24/2018 his main complaint was left lower back pain radiating down the left leg.  He was able to ambulate with the aid of a cane but the pain was beginning to significantly impact his ability to remain active and continue to participate in his normal ADLs.  He was taking Percocet 1 to 2 tablets 3 times a day for pain relief and denied any focal weakness, numbness, tingling, or loss of bowel or bladder control.  He elected to proceed with surgical decompressive laminectomy on the left at L3 with complete removal of the left L3  lamina, microscopic foraminotomies of the left L3 and L4 nerve roots and microscopic removal of epidural tumor on the left under the care of Dr. [name] on 08/06/2018.  Final surgical pathology confirmed high-grade metastatic prostate adenocarcinoma.  He completed postoperative palliative radiotherapy to the lumbar spine at the levels of L3-L4 on 09/17/18.  He did note improvement in his lower back pain but did continue to struggle with weakness and  limitations in mobility due to the lower back pain.  Interval History: Unfortunately, he presented to the emergency department on 10/16/2018 with intractable back pain and bilateral lower extremity radicular pain with progressive weakness in his feet.  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  He was started on IV dexamethasone with significant improvement in his symptoms.  His case and imaging were presented at the multidisciplinary brain and spine conference on 10/17/2018 for consideration of treatment options.  The concern at that time was the rapid progression of his disease.  However, after further discussion with Dr. [name] and Dr. [name], the recommendation was to proceed with palliative radiotherapy as opposed to surgical decompression due to concerns for hardware failure.  Dr. [name] remains optimistic, that although he has incurable malignancy, additional systemic options remain available.  Given his maintained excellent performance status, aggressive therapy is felt warranted.  He presents today to discuss the potential role for further palliative radiotherapy to the new sites of disease in the lumbar spine.  PREVIOUS RADIATION THERAPY: Yes  09/04/2018 - 09/17/2018:  The lumbar spine target at L3-L4 was treated to 30 Gy in 10 fractions of 3 Gy.  11/05/2015 - 12/10/2015: 45 Gy in 25 fractions to the pelvis followed by LDR boost, 110 Gy on 12/23/2015 at [facility] c/o Dr. [name].  PAST MEDICAL HISTORY:  Past Medical History:  Diagnosis Date  . Arthritis   . Barrett's esophagus   . Bone cancer ([place])   . GERD (gastroesophageal reflux disease)    takes  Protonix daily  . History of colon polyps   . Hyperlipidemia    takes Zetia and Welchol daily  . Joint pain   . Muscle spasm    takes Robaxin daily as needed  . Prostate cancer ([place])       PAST SURGICAL HISTORY: Past Surgical History:  Procedure Laterality Date  . COLONOSCOPY    . ESOPHAGOGASTRODUODENOSCOPY    . KNEE ARTHROSCOPY Right 2011  . LUMBAR LAMINECTOMY/DECOMPRESSION MICRODISCECTOMY Left 08/06/2018   Procedure: Laminectomy and Foraminotomy - Lumbar Three-Four- left;  Surgeon: Kary Kos, MD;  Location: [place];  Service: Neurosurgery;  Laterality: Left;  left  . TOTAL HIP ARTHROPLASTY Left 05/07/2013   Procedure: LEFT TOTAL HIP ARTHROPLASTY ANTERIOR APPROACH;  Surgeon: Mcarthur Rossetti, MD;  Location: [place];  Service: Orthopedics;  Laterality: Left;  . TOTAL HIP ARTHROPLASTY Right   . TOTAL HIP REVISION Right 02/02/2015   Procedure: TOTAL HIP REVISION;  Surgeon: Kerin Salen, MD;  Location: [place];  Service: Orthopedics;  Laterality: Right;    FAMILY HISTORY:  Family History  Problem Relation Age of Onset  . Colon cancer Mother   . Colon cancer Brother   . Prostate cancer Brother  SOCIAL HISTORY:  Social History   Socioeconomic History  . Marital status: Married    Spouse name: Not on file  . Number of children: 3  . Years of education: Not on file  . Highest education level: Not on file  Occupational History  . Not on file  Social Needs  . Financial resource strain: Not on file  . Food insecurity:    Worry: Not on file    Inability: Not on file  . Transportation needs:    Medical: Not on file    Non-medical: Not on file  Tobacco Use  . Smoking status: Former Smoker    Packs/day: 0.25    Years: 2.00    Pack years: 0.50    Last attempt to quit: 12/19/1968    Years since quitting: 49.8  . Smokeless tobacco: Former Systems developer  . Tobacco comment: quit smoking 50yrs ago  Substance and Sexual Activity  . Alcohol use: No  . Drug use: No  . Sexual activity:  Not Currently  Lifestyle  . Physical activity:    Days per week: Not on file    Minutes per session: Not on file  . Stress: Not on file  Relationships  . Social connections:    Talks on phone: Not on file    Gets together: Not on file    Attends religious service: Not on file    Active member of club or organization: Not on file    Attends meetings of clubs or organizations: Not on file    Relationship status: Not on file  . Intimate partner violence:    Fear of current or ex partner: Not on file    Emotionally abused: Not on file    Physically abused: Not on file    Forced sexual activity: Not on file  Other Topics Concern  . Not on file  Social History Narrative  . Not on file    ALLERGIES: Statins  MEDICATIONS:  No current facility-administered medications for this encounter.    No current outpatient medications on file.   Facility-Administered Medications Ordered in Other Encounters  Medication Dose Route Frequency Provider Last Rate Last Dose  . abiraterone acetate (ZYTIGA) tablet 1,000 mg  1,000 mg Oral Daily Caren Griffins, MD   1,000 mg at 10/19/18 0617  . amLODipine (NORVASC) tablet 10 mg  10 mg Oral Daily Thurnell Lose, MD   10 mg at 10/19/18 1237  . cycloSPORINE (RESTASIS) 0.05 % ophthalmic emulsion 1 drop  1 drop Both Eyes BID Caren Griffins, MD   1 drop at 10/19/18 0801  . dexamethasone (DECADRON) injection 4 mg  4 mg Intravenous Q6H Caren Griffins, MD   4 mg at 10/19/18 1237  . docusate sodium (COLACE) capsule 100 mg  100 mg Oral BID Caren Griffins, MD   100 mg at 10/19/18 0801  . ezetimibe (ZETIA) tablet 10 mg  10 mg Oral q morning - 10a Caren Griffins, MD   10 mg at 10/19/18 0801  . heparin injection 5,000 Units  5,000 Units Subcutaneous Q8H Thurnell Lose, MD   5,000 Units at 10/19/18 1459  . hydrALAZINE (APRESOLINE) injection 5 mg  5 mg Intravenous Q4H PRN Black, Lezlie Octave, NP      . ibuprofen (ADVIL,MOTRIN) tablet 200 mg  200 mg Oral  Q6H PRN Caren Griffins, MD      . Influenza vac split quadrivalent PF (FLUZONE HIGH-DOSE) injection 0.5 mL  0.5 mL Intramuscular Tomorrow-1000  Reyne Dumas, MD      . insulin aspart (novoLOG) injection 0-5 Units  0-5 Units Subcutaneous QHS Black, Karen M, NP      . insulin aspart (novoLOG) injection 0-9 Units  0-9 Units Subcutaneous TID WC Radene Gunning, NP   1 Units at 10/18/18 1724  . ondansetron (ZOFRAN) tablet 4 mg  4 mg Oral Q8H PRN Caren Griffins, MD      . oxyCODONE (Oxy IR/ROXICODONE) immediate release tablet 5-10 mg  5-10 mg Oral Q4H PRN Caren Griffins, MD   10 mg at 10/19/18 1601  . pantoprazole (PROTONIX) EC tablet 40 mg  40 mg Oral QPC supper Caren Griffins, MD   40 mg at 10/18/18 1728  . polyethylene glycol (MIRALAX / GLYCOLAX) packet 17 g  17 g Oral Daily PRN Caren Griffins, MD      . tamsulosin (FLOMAX) capsule 0.4 mg  0.4 mg Oral Daily Caren Griffins, MD   0.4 mg at 10/19/18 0801  . traMADol (ULTRAM) tablet 50 mg  50 mg Oral Q6H PRN Caren Griffins, MD   50 mg at 10/19/18 0820  . triamcinolone cream (KENALOG) 0.1 % 1 application  1 application Topical Daily PRN Caren Griffins, MD        REVIEW OF SYSTEMS:  On review of systems, the patient reports that he is doing okay overall and pain remains improved on dexamethasone. He denies any chest pain, shortness of breath, cough, fevers, chills, night sweats, or unintended weight changes.  He denies any bowel or bladder disturbances, and denies abdominal pain, nausea or vomiting. He reports pain in his lower back and legs that is making it difficult to walk any distance, however he states that his back pain has improved since admission. He denies pain when sitting and reports pain is increased with ambulation and standing with radiation of pain down his legs. He denies numbness or tingling. A complete review of systems is obtained and is otherwise negative.    PHYSICAL EXAM:  Wt Readings from Last 3 Encounters:    10/16/18 189 lb 6 oz (85.9 kg)  10/19/18 189 lb (85.7 kg)  10/02/18 189 lb 4.8 oz (85.9 kg)   Temp Readings from Last 3 Encounters:  10/19/18 97.7 F (36.5 C) (Oral)  10/19/18 97.9 F (36.6 C) (Oral)  10/02/18 97.8 F (36.6 C) (Oral)   BP Readings from Last 3 Encounters:  10/19/18 135/80  10/19/18 (!) 170/97  10/02/18 (!) 170/97   Pulse Readings from Last 3 Encounters:  10/19/18 78  10/19/18 (!) 104  10/02/18 79   Pain Assessment Pain Score: 0-No pain/10  In general this is a well appearing caucasian male presenting in wheelchair today. He is in no acute distress. He's alert and oriented x4 and appropriate throughout the examination. Cardiopulmonary assessment is negative for acute distress and he exhibits normal effort. Sensation intact to light touch bilaterally in the lower extremities, and strength is 5/5 and equal bilaterally in the lower extremities. Incision over midline low back appears well-healed without signs of infection, non-tender to palpation and no induration.   KPS = 80  100 - Normal; no complaints; no evidence of disease. 90   - Able to carry on normal activity; minor signs or symptoms of disease. 80   - Normal activity with effort; some signs or symptoms of disease. 48   - Cares for self; unable to carry on normal activity or to do active work. 60   -  Requires occasional assistance, but is able to care for most of his personal needs. 50   - Requires considerable assistance and frequent medical care. 82   - Disabled; requires special care and assistance. 61   - Severely disabled; hospital admission is indicated although death not imminent. 24   - Very sick; hospital admission necessary; active supportive treatment necessary. 10   - Moribund; fatal processes progressing rapidly. 0     - Dead  Karnofsky DA, Abelmann [place], Craver LS and Burchenal [facility] [phone]) The use of the nitrogen mustards in the palliative treatment of carcinoma: with particular reference to  bronchogenic carcinoma Cancer 1 634-56  LABORATORY DATA:  Lab Results  Component Value Date   WBC 4.6 10/17/2018   HGB 11.9 (L) 10/17/2018   HCT 36.8 (L) 10/17/2018   MCV 95.8 10/17/2018   PLT 288 10/17/2018   Lab Results  Component Value Date   NA 138 10/17/2018   K 4.4 10/17/2018   CL 108 10/17/2018   CO2 21 (L) 10/17/2018   Lab Results  Component Value Date   ALT 29 10/17/2018   AST 18 10/17/2018   ALKPHOS 88 10/17/2018   BILITOT 0.6 10/17/2018     RADIOGRAPHY: Mr Lumbar Spine W Wo Contrast  Result Date: 10/16/2018 CLINICAL DATA:  Back pain radiating to RIGHT hip and leg. History of metastatic prostate cancer, L3-4 laminectomy August 2019. EXAM: MRI LUMBAR SPINE WITHOUT AND WITH CONTRAST TECHNIQUE: Multiplanar and multiecho pulse sequences of the lumbar spine were obtained without and with intravenous contrast. CONTRAST:  10 cc Gadavist COMPARISON:  MRI lumbar spine June 24, 2018 FINDINGS: SEGMENTATION: For the purposes of this report, the last well-formed intervertebral disc is reported as L5-S1. ALIGNMENT: Maintained lumbar lordosis. Minimal grade 1 anterolisthesis. VERTEBRAE:Severe at L3-1 burst fracture new from prior examination with greater than 75% central height loss, diffusely low T1, heterogeneously bright STIR signal with enhancement. New enhancing low signal 22 mm metastasis RIGHT L1 middle column extending to RIGHT pedicle. Old moderate to severe L5 compression fracture. Diffusely bright T1 bone marrow signal compatible with post radiation change in osteopenia. Moderate L1-2 disc height loss, remaining disc morphology is maintained with diffuse desiccation and superimposed mild disc edema L2-3 through L4-5. new LEFT L2-3 hemilaminectomy with subcentimeter fluid collection within surgical bed, likely seroma without suspicious enhancement. CONUS MEDULLARIS AND CAUDA EQUINA: Conus medullaris terminates at L1-2 and demonstrates normal morphology and signal characteristics  though slightly displaced to the RIGHT. New nonenhancing expansile fluid signal posterior thecal sac from the included thoracic spine to the level of L3-4, anteriorly displacing the cauda equina. No abnormal cord or leptomeningeal enhancement. PARASPINAL AND OTHER SOFT TISSUES: Worsening prevertebral/retroperitoneal lymphadenopathy L1, similar at L3 and L5. Ventral tumor at L2-3. Postoperative LEFT multifidus denervation at and below the level of surgical intervention. Mildly edematous iliopsoas muscles worse than prior study. T2 bright cyst bilateral kidneys. DISC LEVELS: T12-L1: No disc bulge, canal stenosis nor neural foraminal narrowing. L1-2: Small broad-based disc bulge, small LEFT extraforaminal disc protrusion no canal stenosis. Mild LEFT neural foraminal narrowing. L2-3: Anterolisthesis. LEFT laminectomy. Mild RIGHT facet arthropathy. Tumoral extension and retro pulsed bony fragments extending into ventral epidural space narrowing the canal to 7 mm. Tumor within the medial LEFT neural foramen resulting in moderate stenosis. Mild to moderate stenosis RIGHT neural foramen. L3-4: Small broad-based disc bulge and minimal ventral epidural tumor, mild facet arthropathy and ligamentum flavum redundancy. Moderate canal stenosis. Tumor within cephalad neural foramen. Mild RIGHT and moderate LEFT  neural foraminal narrowing. L4-5: Endplate spurring. Mild facet arthropathy and ligamentum flavum redundancy. Mild canal stenosis. Mild bilateral neural foraminal narrowing. L5-S1: No disc bulge, canal stenosis nor neural foraminal narrowing. Mild facet arthropathy. IMPRESSION: 1. New severe L3 pathologic fracture, potentially acute. New L1 osseous metastasis. Worsening retroperitoneal nodal metastasis. 2. Status post LEFT L2-3 laminectomy. New posterior thecal cyst (pseudomeningocele, arachnoid adhesions or hygroma). Mass effect with anterior displacement of the cauda equina. 3. Epidural tumor (and retropulsed bony  fragments at L3) resulting in moderate L2-3 and L3-4 canal stenosis. 4. Neural foraminal narrowing L1-2 through L4-5 moderate on the LEFT at L3-4 in part due to tumor. Electronically Signed   By: Elon Alas M.D.   On: 10/16/2018 15:17   Vas [place] Lower Extremity Venous (dvt)  Result Date: 10/17/2018  Lower Venous Study Indications: Edema. Bilateral lower extremity for the past thee weeks.  Performing Technologist: Toma Copier RVS  Examination Guidelines: A complete evaluation includes B-mode imaging, spectral Doppler, color Doppler, and power Doppler as needed of all accessible portions of each vessel. Bilateral testing is considered an integral part of a complete examination. Limited examinations for reoccurring indications may be performed as noted.  Right Venous Findings: +---------+---------------+---------+-----------+----------+-------+          CompressibilityPhasicitySpontaneityPropertiesSummary +---------+---------------+---------+-----------+----------+-------+ CFV      Full           Yes      Yes                          +---------+---------------+---------+-----------+----------+-------+ SFJ      Full                                                 +---------+---------------+---------+-----------+----------+-------+ FV Prox  Full           Yes      Yes                          +---------+---------------+---------+-----------+----------+-------+ FV Mid   Full                                                 +---------+---------------+---------+-----------+----------+-------+ FV DistalFull           Yes      Yes                          +---------+---------------+---------+-----------+----------+-------+ PFV      Full           Yes      Yes                          +---------+---------------+---------+-----------+----------+-------+ POP      Full           Yes      Yes                           +---------+---------------+---------+-----------+----------+-------+ PTV      Full                                                 +---------+---------------+---------+-----------+----------+-------+  PERO     Full                                                 +---------+---------------+---------+-----------+----------+-------+  Right Technical Findings: Technically difficult to image the lower leg due to edema  Left Venous Findings: +---------+---------------+---------+-----------+----------+-------+          CompressibilityPhasicitySpontaneityPropertiesSummary +---------+---------------+---------+-----------+----------+-------+ CFV      Full           Yes      Yes                          +---------+---------------+---------+-----------+----------+-------+ SFJ      Full                                                 +---------+---------------+---------+-----------+----------+-------+ FV Prox  Full           Yes      Yes                          +---------+---------------+---------+-----------+----------+-------+ FV Mid   Full                                                 +---------+---------------+---------+-----------+----------+-------+ FV DistalFull           Yes      Yes                          +---------+---------------+---------+-----------+----------+-------+ PFV      Full           Yes      Yes                          +---------+---------------+---------+-----------+----------+-------+ POP      Full           Yes      Yes                          +---------+---------------+---------+-----------+----------+-------+ PTV      Full                                                 +---------+---------------+---------+-----------+----------+-------+ PERO     Full                                                 +---------+---------------+---------+-----------+----------+-------+  Left Technical Findings: Technically difficult  to image the lower leg due to edema   Summary: Right: There is no evidence of deep vein thrombosis in the lower extremity. No cystic structure found in the popliteal fossa. Left: There is no evidence of deep vein thrombosis in the lower extremity.  No cystic structure found in the popliteal fossa.  *See table(s) above for measurements and observations. Electronically signed by Curt Jews MD on 10/17/2018 at 3:36:29 PM.    Final       IMPRESSION/PLAN: 1. 77 y.o. 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 Today, we talked to the patient and his wife about the findings and workup thus far. 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 We discussed the available radiation techniques, and focused on the details of logistics and delivery.  The recommendation is to proceed with stereotactic radiosurgery delivered to the level of L1, L2 and L5 in a single fraction.  We reviewed the anticipated acute and late sequelae associated with radiation in this setting. The patient was encouraged to ask questions that were answered to his satisfaction.  He appears to have a good understanding of his disease and our treatment recommendations with the goal of palliating his pain and preventing further progression of disease in the lumbar spine.  At the conclusion of our conversation, the patient elects to proceed with palliative stereotactic radiosurgery to the lumbar spine. We will share our discussion with Dr. [name] and Dr. [name] and move forward with treatment planning accordingly.  The patient will undergo CT simulation today in anticipation of [place] treatment early next week.  He freely signed written consent to proceed today in the office and a copy was placed in his medical record.  He knows to call at  anytime with any questions or concerns in the interim.  ------------------------------------------------   Tyler Pita, MD [place] Director and Director of Stereotactic Radiosurgery Direct Dial: [phone]  Fax: [phone] [email or web address]  Skype  [email or web address]

## 2018-10-19 NOTE — Progress Notes (Signed)
Pt returned to room via [place]

## 2018-10-20 LAB — BASIC METABOLIC PANEL
ANION GAP: 4 — AB (ref 5–15)
BUN: 22 mg/dL (ref 8–23)
CALCIUM: 8.3 mg/dL — AB (ref 8.9–10.3)
CO2: 26 mmol/L (ref 22–32)
Chloride: 106 mmol/L (ref 98–111)
Creatinine, Ser: 0.9 mg/dL (ref 0.61–1.24)
Glucose, Bld: 118 mg/dL — ABNORMAL HIGH (ref 70–99)
Potassium: 4.9 mmol/L (ref 3.5–5.1)
Sodium: 136 mmol/L (ref 135–145)

## 2018-10-20 LAB — GLUCOSE, CAPILLARY: Glucose-Capillary: 92 mg/dL (ref 70–99)

## 2018-10-20 LAB — CBC
HCT: 38 % — ABNORMAL LOW (ref 39.0–52.0)
Hemoglobin: 12.4 g/dL — ABNORMAL LOW (ref 13.0–17.0)
MCH: 31.2 pg (ref 26.0–34.0)
MCHC: 32.6 g/dL (ref 30.0–36.0)
MCV: 95.5 fL (ref 80.0–100.0)
NRBC: 0 % (ref 0.0–0.2)
Platelets: 320 10*3/uL (ref 150–400)
RBC: 3.98 MIL/uL — ABNORMAL LOW (ref 4.22–5.81)
RDW: 14.9 % (ref 11.5–15.5)
WBC: 8 10*3/uL (ref 4.0–10.5)

## 2018-10-20 LAB — MAGNESIUM: Magnesium: 2.4 mg/dL (ref 1.7–2.4)

## 2018-10-20 MED ORDER — DEXAMETHASONE 4 MG PO TABS: 4.0000 mg | ORAL_TABLET | Freq: Two times a day (BID) | ORAL | 0 refills | Status: DC

## 2018-10-20 MED ORDER — AMLODIPINE BESYLATE 10 MG PO TABS: 10.0000 mg | ORAL_TABLET | Freq: Every day | ORAL | 0 refills | Status: DC

## 2018-10-20 NOTE — Discharge Instructions (Signed)
Follow with Primary MD [name], MD in 3 days   Get CBC, BMP checked  by Primary MD in 3 days    Activity: Wear the back brace when out of the bed, activity as tolerated with Full fall precautions use walker/cane & assistance as needed  Disposition Home    Diet: Heart Healthy    Special Instructions: If you have smoked or chewed Tobacco  in the last 2 yrs please stop smoking, stop any regular Alcohol  and or any Recreational drug use.  On your next visit with your primary care physician please Get Medicines reviewed and adjusted.  Please request your Prim.MD to go over all Hospital Tests and Procedure/Radiological results at the follow up, please get all Hospital records sent to your Prim MD by signing hospital release before you go home.  If you experience worsening of your admission symptoms, develop shortness of breath, life threatening emergency, suicidal or homicidal thoughts you must seek medical attention immediately by calling 911 or calling your MD immediately  if symptoms less severe.  You Must read complete instructions/literature along with all the possible adverse reactions/side effects for all the Medicines you take and that have been prescribed to you. Take any new Medicines after you have completely understood and accpet all the possible adverse reactions/side effects.

## 2018-10-20 NOTE — Progress Notes (Signed)
  NEUROSURGERY PROGRESS NOTE   No issues overnight. Back pain significantly improved compared to when he was first admitted Met with rad onc yesterday. Report Tx starts Wednesday  EXAM:  BP 128/90 (BP Location: Left Arm)   Pulse 69   Temp 97.9 F (36.6 C) (Oral)   Resp 14   Ht _0  (1.803 m)   Wt 85.9 kg   SpO2 99%   BMI 26.41 kg/m   Awake, alert, oriented  Speech fluent, appropriate  CN grossly intact  MAEW with good strength  PLAN Stable this am Will be d/c home today with plan to start radiation tx Wednesday

## 2018-10-20 NOTE — Discharge Summary (Signed)
[name] LNL:[id number] DOB: July 25, 1941 DOA: 10/16/2018  PCP: Zachary Pretty, MD  Admit date: 10/16/2018  Discharge date: 10/20/2018  Admitted From: Home   Disposition:  Home   Recommendations for Outpatient Follow-up:   Follow up with PCP in 1-2 weeks  PCP Please obtain BMP/CBC, 2 view CXR in 1week,  (see Discharge instructions)   PCP Please follow up on the following pending results:    Home Health: PT, RN   Equipment/Devices: Conservation officer, nature  Consultations: Radiation oncology, neurosurgery, Onco Discharge Condition: Fair   CODE STATUS: Full   Diet Recommendation: Heart Healthy    Chief Complaint  Patient presents with  . Flank Pain  . 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 [name], currently on Zytiga and Lupron, hyperlipidemia, arthritis, who presents to the hospital with chief complaint of back pain. MRI of the L-spine which showed worsening metastatic disease,with new severe L3 pathologic fracture, new L1 metastasis, worsening retroperitoneal nodal metastasis, epidural tumor resulting in moderate L2-3 and L3-4 canal stenosis. Neurosurgery was consulted, they recommended steroids and TRH   called to admit. After tumor board meeting and multiple consults , by oncology , rad onc and NS ,patient scheduled to discuss radiation treatment options and complete the simulation procedure for his spinal radiation at [facility] in am.                                                                  Hospital Course   Metastatic prostate cancer with spine mets/back pain due to New severe L3  pathologic fracture, potentially acute. New L1 osseous metastasis.- patient feels better, continue present pain control and steroids, neurosurgery and radiation oncology have been involved, and is to manage the patient nonoperatively, plan is to start radiation treatments which will be palliative in nature from 10/25/2018, will be placed on oral steroids, lumbar support brace when out of bed, home PT RN and follow with neurosurgery and radiation oncology within a week.  Bilateral lower extremity swelling  - DVT, ruled out with ultrasound, echocardiogram shows preserved EF chronic grade 1 diastolic CHF, he was treated with TED stockings and a single dose of IV Lasix with much improvement, PCP to monitor, may require low-dose diuretic depending on how he does.   Hyperlipidemia  - Continue home medications  Hypertension. He is on Norvasc, as needed hydralazine to continue.  BPH.  On Flomax.    GERD.  On PPI continue.    Hyperglycemia. Likely steroid induced. No hx of diabetes, stable.     Discharge diagnosis     Principal Problem:  Back pain Active Problems:   Prostate cancer metastatic to bone (HCC)   Pressure injury of skin   Swelling of lower extremity   Hyperglycemia    Discharge instructions    Discharge Instructions    Diet - low sodium heart healthy   Complete by:  As directed    Discharge instructions   Complete by:  As directed    Follow with Primary MD Zachary Pretty, MD in 3 days   Get CBC, BMP checked  by Primary MD in 3 days    Activity: Wear the back brace when out of the bed, activity as tolerated with Full fall precautions use walker/cane & assistance as needed  Disposition Home    Diet: Heart Healthy    Special Instructions: If you have smoked or chewed Tobacco  in the last 2 yrs please stop smoking, stop any regular Alcohol  and or any Recreational drug use.  On your next visit with your primary care physician please Get Medicines reviewed and  adjusted.  Please request your Prim.MD to go over all Hospital Tests and Procedure/Radiological results at the follow up, please get all Hospital records sent to your Prim MD by signing hospital release before you go home.  If you experience worsening of your admission symptoms, develop shortness of breath, life threatening emergency, suicidal or homicidal thoughts you must seek medical attention immediately by calling 911 or calling your MD immediately  if symptoms less severe.  You Must read complete instructions/literature along with all the possible adverse reactions/side effects for all the Medicines you take and that have been prescribed to you. Take any new Medicines after you have completely understood and accpet all the possible adverse reactions/side effects.   Increase activity slowly   Complete by:  As directed       Discharge Medications   Allergies as of 10/20/2018      Reactions   Statins Other (See Comments)   Muscle pain      Medication List    STOP taking these medications   doxycycline 100 MG tablet Commonly known as:  VIBRA-TABS   predniSONE 5 MG tablet Commonly known as:  DELTASONE     TAKE these medications   amLODipine 10 MG tablet Commonly known as:  NORVASC Take 1 tablet (10 mg total) by mouth daily.   CALCIUM 1000 + D PO Take 30 mLs by mouth daily. Calcium 1000 mg / Vit D 1000 iu   cycloSPORINE 0.05 % ophthalmic emulsion Commonly known as:  RESTASIS Place 1 drop into both eyes 2 (two) times daily.   dexamethasone 4 MG tablet Commonly known as:  DECADRON Take 1 tablet (4 mg total) by mouth 2 (two) times daily. Talk to Dr Osker Mason in 1 week on tapering the daose What changed:  additional instructions   docusate sodium 100 MG capsule Commonly known as:  COLACE Take 100 mg by mouth 2 (two) times daily.   ezetimibe 10 MG tablet Commonly known as:  ZETIA Take 10 mg by mouth every morning.   ibuprofen 200 MG tablet Commonly known as:   ADVIL,MOTRIN Take 200 mg by mouth every 6 (six) hours as needed for moderate pain.   leuprolide 30 MG injection Commonly known as:  LUPRON Inject 45 mg into the muscle every 6 (six) months.   ondansetron 4 MG tablet Commonly known as:  ZOFRAN Take 4 mg by mouth every 8 (eight) hours as needed for nausea or vomiting.   oxyCODONE 5 MG immediate release  tablet Commonly known as:  Oxy IR/ROXICODONE Take 1-2 tablets (5-10 mg total) by mouth every 4 (four) hours as needed for severe pain.   pantoprazole 40 MG tablet Commonly known as:  PROTONIX Take 40 mg by mouth daily after supper.   polyethylene glycol packet Commonly known as:  MIRALAX / GLYCOLAX Take 17 g by mouth daily as needed for mild constipation.   tamsulosin 0.4 MG Caps capsule Commonly known as:  FLOMAX Take 0.4 mg by mouth daily.   traMADol 50 MG tablet Commonly known as:  ULTRAM TAKE 1 TABLET BY MOUTH EVERY 6 HOURS AS NEEDED SEVERE PAIN   triamcinolone cream 0.1 % Commonly known as:  KENALOG Apply 1 application topically daily as needed (dry skin in the winter).   ZYTIGA 250 MG tablet Generic drug:  abiraterone acetate TAKE 4 TABLETS (1,000 MG TOTAL) BY MOUTH DAILY. TAKE ON AN EMPTY STOMACH 1 HOUR BEFORE OR 2 HOURS AFTER A MEAL       Follow-up Information    Zachary Pretty, MD. Schedule an appointment as soon as possible for a visit in 3 day(s).   Specialty:  Internal Medicine Why:  and Dr Osker Mason in 1 week Contact information: [place] [address] [phone]        Gery Pray, MD. Schedule an appointment as soon as possible for a visit in 4 day(s).   Specialty:  Radiation Oncology Contact information: [place] [address] [phone]        Kary Kos, MD. Schedule an appointment as soon as possible for a visit in 1 week(s).   Specialty:  Neurosurgery Contact information: [address] [place] [address] [place]  [address] [phone]           Major procedures and Radiology Reports - PLEASE review detailed and final reports thoroughly  -     TTE  - Left ventricle: The cavity size was normal. There was moderate   concentric hypertrophy. Systolic function was normal. The   estimated ejection fraction was in the range of 60% to 65%. Wall   motion was normal; there were no regional wall motion   abnormalities. Doppler parameters are consistent with abnormal   left ventricular relaxation (grade 1 diastolic dysfunction).   There was no evidence of elevated ventricular filling pressure by   Doppler parameters. - Aortic valve: There was no regurgitation. - Mitral valve: There was trivial regurgitation. - Left atrium: The atrium was normal in size. - Right ventricle: Systolic function was normal. - Tricuspid valve: There was mild regurgitation. - Pulmonary arteries: Systolic pressure was within the normal   range. - Inferior vena cava: The vessel was normal in size. - Pericardium, extracardiac: There was no pericardial effusion.   Mr Lumbar Spine W Wo Contrast  Result Date: 10/16/2018 CLINICAL DATA:  Back pain radiating to RIGHT hip and leg. History of metastatic prostate cancer, L3-4 laminectomy August 2019. EXAM: MRI LUMBAR SPINE WITHOUT AND WITH CONTRAST TECHNIQUE: Multiplanar and multiecho pulse sequences of the lumbar spine were obtained without and with intravenous contrast. CONTRAST:  10 cc Gadavist COMPARISON:  MRI lumbar spine June 24, 2018 FINDINGS: SEGMENTATION: For the purposes of this report, the last well-formed intervertebral disc is reported as L5-S1. ALIGNMENT: Maintained lumbar lordosis. Minimal grade 1 anterolisthesis. VERTEBRAE:Severe at L3-1 burst fracture new from prior examination with greater than 75% central height loss, diffusely low T1, heterogeneously bright STIR signal with enhancement. New enhancing low signal 22 mm metastasis  RIGHT L1 middle column extending to RIGHT  pedicle. Old moderate to severe L5 compression fracture. Diffusely bright T1 bone marrow signal compatible with post radiation change in osteopenia. Moderate L1-2 disc height loss, remaining disc morphology is maintained with diffuse desiccation and superimposed mild disc edema L2-3 through L4-5. new LEFT L2-3 hemilaminectomy with subcentimeter fluid collection within surgical bed, likely seroma without suspicious enhancement. CONUS MEDULLARIS AND CAUDA EQUINA: Conus medullaris terminates at L1-2 and demonstrates normal morphology and signal characteristics though slightly displaced to the RIGHT. New nonenhancing expansile fluid signal posterior thecal sac from the included thoracic spine to the level of L3-4, anteriorly displacing the cauda equina. No abnormal cord or leptomeningeal enhancement. PARASPINAL AND OTHER SOFT TISSUES: Worsening prevertebral/retroperitoneal lymphadenopathy L1, similar at L3 and L5. Ventral tumor at L2-3. Postoperative LEFT multifidus denervation at and below the level of surgical intervention. Mildly edematous iliopsoas muscles worse than prior study. T2 bright cyst bilateral kidneys. DISC LEVELS: T12-L1: No disc bulge, canal stenosis nor neural foraminal narrowing. L1-2: Small broad-based disc bulge, small LEFT extraforaminal disc protrusion no canal stenosis. Mild LEFT neural foraminal narrowing. L2-3: Anterolisthesis. LEFT laminectomy. Mild RIGHT facet arthropathy. Tumoral extension and retro pulsed bony fragments extending into ventral epidural space narrowing the canal to 7 mm. Tumor within the medial LEFT neural foramen resulting in moderate stenosis. Mild to moderate stenosis RIGHT neural foramen. L3-4: Small broad-based disc bulge and minimal ventral epidural tumor, mild facet arthropathy and ligamentum flavum redundancy. Moderate canal stenosis. Tumor within cephalad neural foramen. Mild RIGHT and moderate LEFT neural foraminal narrowing. L4-5: Endplate spurring. Mild facet  arthropathy and ligamentum flavum redundancy. Mild canal stenosis. Mild bilateral neural foraminal narrowing. L5-S1: No disc bulge, canal stenosis nor neural foraminal narrowing. Mild facet arthropathy. IMPRESSION: 1. New severe L3 pathologic fracture, potentially acute. New L1 osseous metastasis. Worsening retroperitoneal nodal metastasis. 2. Status post LEFT L2-3 laminectomy. New posterior thecal cyst (pseudomeningocele, arachnoid adhesions or hygroma). Mass effect with anterior displacement of the cauda equina. 3. Epidural tumor (and retropulsed bony fragments at L3) resulting in moderate L2-3 and L3-4 canal stenosis. 4. Neural foraminal narrowing L1-2 through L4-5 moderate on the LEFT at L3-4 in part due to tumor. Electronically Signed   By: Elon Alas M.D.   On: 10/16/2018 15:17   Vas [place] Lower Extremity Venous (dvt)  Result Date: 10/17/2018  Lower Venous Study Indications: Edema. Bilateral lower extremity for the past thee weeks.  Performing Technologist: Toma Copier RVS  Examination Guidelines: A complete evaluation includes B-mode imaging, spectral Doppler, color Doppler, and power Doppler as needed of all accessible portions of each vessel. Bilateral testing is considered an integral part of a complete examination. Limited examinations for reoccurring indications may be performed as noted.  Right Venous Findings: +---------+---------------+---------+-----------+----------+-------+          CompressibilityPhasicitySpontaneityPropertiesSummary +---------+---------------+---------+-----------+----------+-------+ CFV      Full           Yes      Yes                          +---------+---------------+---------+-----------+----------+-------+ SFJ      Full                                                 +---------+---------------+---------+-----------+----------+-------+ FV Prox  Full  Yes      Yes                           +---------+---------------+---------+-----------+----------+-------+ FV Mid   Full                                                 +---------+---------------+---------+-----------+----------+-------+ FV DistalFull           Yes      Yes                          +---------+---------------+---------+-----------+----------+-------+ PFV      Full           Yes      Yes                          +---------+---------------+---------+-----------+----------+-------+ POP      Full           Yes      Yes                          +---------+---------------+---------+-----------+----------+-------+ PTV      Full                                                 +---------+---------------+---------+-----------+----------+-------+ PERO     Full                                                 +---------+---------------+---------+-----------+----------+-------+  Right Technical Findings: Technically difficult to image the lower leg due to edema  Left Venous Findings: +---------+---------------+---------+-----------+----------+-------+          CompressibilityPhasicitySpontaneityPropertiesSummary +---------+---------------+---------+-----------+----------+-------+ CFV      Full           Yes      Yes                          +---------+---------------+---------+-----------+----------+-------+ SFJ      Full                                                 +---------+---------------+---------+-----------+----------+-------+ FV Prox  Full           Yes      Yes                          +---------+---------------+---------+-----------+----------+-------+ FV Mid   Full                                                 +---------+---------------+---------+-----------+----------+-------+ FV DistalFull           Yes      Yes                          +---------+---------------+---------+-----------+----------+-------+  PFV      Full           Yes      Yes                           +---------+---------------+---------+-----------+----------+-------+ POP      Full           Yes      Yes                          +---------+---------------+---------+-----------+----------+-------+ PTV      Full                                                 +---------+---------------+---------+-----------+----------+-------+ PERO     Full                                                 +---------+---------------+---------+-----------+----------+-------+  Left Technical Findings: Technically difficult to image the lower leg due to edema   Summary: Right: There is no evidence of deep vein thrombosis in the lower extremity. No cystic structure found in the popliteal fossa. Left: There is no evidence of deep vein thrombosis in the lower extremity. No cystic structure found in the popliteal fossa.  *See table(s) above for measurements and observations. Electronically signed by Curt Jews MD on 10/17/2018 at 3:36:29 PM.    Final     Micro Results    Recent Results (from the past 240 hour(s))  Culture, blood (routine x 2)     Status: None (Preliminary result)   Collection Time: 10/16/18 12:19 PM  Result Value Ref Range Status   Specimen Description BLOOD LEFT ANTECUBITAL  Final   Special Requests   Final    BOTTLES DRAWN AEROBIC AND ANAEROBIC Blood Culture adequate volume   Culture   Final    NO GROWTH 4 DAYS Performed at [facility], [address], [place], [place] [address]    Report Status PENDING  Incomplete  Culture, blood (routine x 2)     Status: None (Preliminary result)   Collection Time: 10/16/18 12:24 PM  Result Value Ref Range Status   Specimen Description BLOOD LEFT HAND  Final   Special Requests   Final    BOTTLES DRAWN AEROBIC AND ANAEROBIC Blood Culture results may not be optimal due to an inadequate volume of blood received in culture bottles   Culture   Final    NO GROWTH 4 DAYS Performed at [facility], [place]  [address] [place], [place] [address]    Report Status PENDING  Incomplete    Today   Subjective    Verle Whe[place] today has no headache,no chest abdominal pain,no new weakness tingling or numbness, feels much better wants to go home today.    Objective   Blood pressure 128/90, pulse 69, temperature 97.9 F (36.6 C), temperature source Oral, resp. rate 14, height 5\' 11"  (1.803 m), weight 85.9 kg, SpO2 99 %.   Intake/Output Summary (Last 24 hours) at 10/20/2018 0816 Last data filed at 10/19/2018 1518 Gross per 24 hour  Intake 360 ml  Output -  Net 360 ml  Exam Awake Alert, Oriented x 3, No new F.N deficits, Normal affect [place].AT,PERRAL Supple Neck,No JVD, No cervical lymphadenopathy appriciated.  Symmetrical Chest wall movement, Good air movement bilaterally, CTAB RRR,No Gallops,Rubs or new Murmurs, No Parasternal Heave +ve B.Sounds, Abd Soft, Non tender, No organomegaly appriciated, No rebound -guarding or rigidity. No Cyanosis, Clubbing or edema, No new Rash or bruise   Data Review   CBC w Diff:  Lab Results  Component Value Date   WBC 8.0 10/20/2018   HGB 12.4 (L) 10/20/2018   HGB 14.2 10/02/2018   HCT 38.0 (L) 10/20/2018   PLT 320 10/20/2018   PLT 192 10/02/2018   LYMPHOPCT 12 10/16/2018   MONOPCT 9 10/16/2018   EOSPCT 1 10/16/2018   BASOPCT 1 10/16/2018    CMP:  Lab Results  Component Value Date   NA 136 10/20/2018   K 4.9 10/20/2018   CL 106 10/20/2018   CO2 26 10/20/2018   BUN 22 10/20/2018   CREATININE 0.90 10/20/2018   CREATININE 0.74 10/02/2018   PROT 5.0 (L) 10/17/2018   ALBUMIN 2.5 (L) 10/17/2018   BILITOT 0.6 10/17/2018   BILITOT 0.4 10/02/2018   ALKPHOS 88 10/17/2018   AST 18 10/17/2018   AST 26 10/02/2018   ALT 29 10/17/2018   ALT 57 (H) 10/02/2018  .   Total Time in preparing paper work, data evaluation and todays exam - 13 minutes  Lala Lund M.D on 10/20/2018 at [place]  [phone]

## 2018-10-20 NOTE — Evaluation (Signed)
Occupational Therapy Evaluation Patient Details Name: [name] MRN: [id number] DOB: Mar 31, 1941 Today's Date: 10/20/2018    History of Present Illness 77 y.o. 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 Wynetta Emery, currently on Zytiga and Lupron, hyperlipidemia, arthritis, who presents to the hospital with chief complaint of back pain MRI of the spine obtained showed a severe L3 pathological fracture that is potentially acute with new L1 osseous metastasis.   Clinical Impression   PTA, pt was living with his wife and was performing BADLs with assistance from his wife for showers. Currently, pt requires Mod-Max A for LB ADLs and Min Guard A for functional mobility using RW. Pr requiring cues for upright posture. Provided education on back precautions, bed mobility, brace management, LB ADLs, toileting, and shower transfer with 3N1; pt demonstrated understanding. Wife present throughout session and very supportive. Answered all pt questions. Recommend dc home once medically stable per physician. All acute OT needs met and will sign off. Thank you.     Follow Up Recommendations  No OT follow up;Supervision/Assistance - 24 hour    Equipment Recommendations  None recommended by OT    Recommendations for Other Services PT consult     Precautions / Restrictions Precautions Precautions: Back Precaution Booklet Issued: Yes (comment) Precaution Comments: Pt able to recall 3/3 back precautions Required Braces or Orthoses: Spinal Brace Spinal Brace: Lumbar corset Restrictions Weight Bearing Restrictions: No      Mobility Bed Mobility Overal bed mobility: Needs Assistance Bed Mobility: Rolling;Sidelying to Sit Rolling: Supervision Sidelying to sit: Supervision       General bed mobility comments: Education on log roll technique. supervision for safety  Transfers Overall  transfer level: Needs assistance Equipment used: Rolling walker (2 wheeled) Transfers: Sit to/from Stand Sit to Stand: Min guard         General transfer comment: Min Guard A for safety    Balance Overall balance assessment: Mild deficits observed, not formally tested                                         ADL either performed or assessed with clinical judgement   ADL Overall ADL's : Needs assistance/impaired Eating/Feeding: Supervision/ safety;Set up;Sitting   Grooming: Oral care;Min guard;Standing Grooming Details (indicate cue type and reason): Min Guard A for safety Upper Body Bathing: Supervision/ safety;Set up;Sitting   Lower Body Bathing: Min guard;With caregiver independent assisting;Sit to/from stand;Moderate assistance Lower Body Bathing Details (indicate cue type and reason): Assistance from wife and Mi nGuard A for safety in standing Upper Body Dressing : Supervision/safety;Set up;Sitting Upper Body Dressing Details (indicate cue type and reason): Pt donning shirt and brace with supervision Lower Body Dressing: Min guard;With caregiver independent assisting;Sit to/from stand;Maximal assistance Lower Body Dressing Details (indicate cue type and reason): Educating on compensatiry techniques. Assistance from wife. Min GUard A for safety in standing Toilet Transfer: Min guard;RW;Ambulation(Simulated in room)     Toileting - Clothing Manipulation Details (indicate cue type and reason): Educating pt on compensatory techniques for toilet hygiene.  Tub/ Shower Transfer: Walk-in shower;Min guard;Ambulation;Rolling walker;3 in 1 Tub/Shower Transfer Details (indicate cue type and reason): Min Guard A for safety Functional mobility during ADLs: Min guard;Rolling walker General ADL Comments: Pt performing ADLs and functional mobility at [employer] A for safety. Providing education on  back precautions, bed mobility, brace management, LB ADLs, toileting, and shower  transfer.      Vision         Perception     Praxis      Pertinent Vitals/Pain Pain Assessment: Faces Faces Pain Scale: Hurts a little bit Pain Location: Low Back Pain Descriptors / Indicators: Aching;Constant;Sore Pain Intervention(s): Monitored during session;Premedicated before session;Limited activity within patient's tolerance;Repositioned     Hand Dominance Right   Extremity/Trunk Assessment Upper Extremity Assessment Upper Extremity Assessment: Overall WFL for tasks assessed   Lower Extremity Assessment Lower Extremity Assessment: Defer to PT evaluation   Cervical / Trunk Assessment Cervical / Trunk Assessment: Kyphotic;Other exceptions Cervical / Trunk Exceptions: severe L3 pathological fracture that is potentially acute with new L1 osseous metastasis   Communication Communication Communication: No difficulties   Cognition Arousal/Alertness: Awake/alert Behavior During Therapy: WFL for tasks assessed/performed Overall Cognitive Status: Within Functional Limits for tasks assessed                                     General Comments  Wife present throughout session    Exercises     Shoulder Instructions      Home Living Family/patient expects to be discharged to:: Private residence Living Arrangements: Spouse/significant other Available Help at Discharge: Family;Available 24 hours/day Type of Home: House Home Access: Stairs to enter [employer] of Steps: 2 Entrance Stairs-Rails: Right;Left;Can reach both Home Layout: One level     Bathroom Shower/Tub: Producer, television/film/video: Handicapped height Bathroom Accessibility: Yes   Home Equipment: Environmental consultant - 2 wheels;Cane - single point;Wheelchair - manual;Bedside commode;Grab bars - tub/shower;Grab bars - toilet          Prior Functioning/Environment Level of Independence: Needs assistance  Gait / Transfers Assistance Needed: ambulating with cane short distances  due to pain; recently purchased a wheelchair for longer distances with increased back pain ADL's / Homemaking Assistance Needed: wife assisting with shower but otherwise independent   Comments: limit by standing tolerance        OT Problem List: Decreased strength;Decreased range of motion;Decreased activity tolerance;Impaired balance (sitting and/or standing);Decreased safety awareness;Decreased knowledge of precautions;Decreased knowledge of use of DME or AE;Pain      OT Treatment/Interventions:      OT Goals(Current goals can be found in the care plan section) Acute Rehab OT Goals Patient Stated Goal: go home soon OT Goal Formulation: All assessment and education complete, DC therapy  OT Frequency:     Barriers to D/C:            Co-evaluation              AM-PAC PT "6 Clicks" Daily Activity     Outcome Measure Help from another person eating meals?: None Help from another person taking care of personal grooming?: A Little Help from another person toileting, which includes using toliet, bedpan, or urinal?: A Little Help from another person bathing (including washing, rinsing, drying)?: A Little Help from another person to put on and taking off regular upper body clothing?: None Help from another person to put on and taking off regular lower body clothing?: A Little 6 Click Score: 20   End of Session Equipment Utilized During Treatment: Rolling walker;Back brace Nurse Communication: Mobility status;Precautions  Activity Tolerance: Patient tolerated treatment well Patient left: with call bell/phone within reach;in bed;with family/visitor present(EOB with wife at bedside)  OT Visit Diagnosis: Unsteadiness on feet (R26.81);Other abnormalities of gait and mobility (R26.89);Muscle weakness (generalized) (M62.81);Pain Pain - part of body: (Back)                Time: 0865-7846 OT Time Calculation (min): 24 min Charges:  OT General Charges $OT Visit: 1 Visit OT  Evaluation $OT Eval Low Complexity: 1 Low OT Treatments $Self Care/Home Management : 8-22 mins  Vivika Poythress MSOT, OTR/L Acute Rehab Pager: [phone] Office: [phone]  Theodoro Grist Adalind Weitz 10/20/2018, 10:33 AM

## 2018-10-20 NOTE — Progress Notes (Signed)
Pt given prescriptions and discharge instructions. Instructions went over with him and wife, both verbalized understanding. All belongings gathered to be sent home.

## 2018-10-20 NOTE — Care Management Note (Signed)
Case Management Note  Patient Details  Name: CORRIN HINGLE MRN: [id number] Date of Birth: 1941-06-28  Subjective/Objective:         Pt presented for decompressive laminectomy after radiation for prostate cancer metastasis.       Pt from home with wife.      Action/Plan: Pt has used [facility] in the past and would like to use again for RN and PT.  Referral called to [place] [facility] [place].  [facility] 11/4 or 11/5.     Expected Discharge Date:  10/20/18               Expected Discharge Plan:  [place]  In-House Referral:  NA  Discharge planning Services  CM Consult  Post Acute Care Choice:  Home Health Choice offered to:  Patient  DME Arranged:    DME Agency:     HH Arranged:  RN, PT [place] Agency:  [facility] (formerly [employer])  Status of Service:  Completed, signed off  If discussed at [employer], dates discussed:    Additional Comments:  Claudie Leach, RN 10/20/2018, 10:00 AM

## 2018-10-21 LAB — CULTURE, BLOOD (ROUTINE X 2)
CULTURE: NO GROWTH
Culture: NO GROWTH
Special Requests: ADEQUATE

## 2018-10-21 NOTE — Progress Notes (Signed)
  Radiation Oncology         [phone] ________________________________  Name: [name] MRN: [id number]  Date: 10/19/2018  DOB: Apr 05, 1941  STEREOTACTIC BODY RADIOTHERAPY SIMULATION AND TREATMENT PLANNING NOTE    ICD-10-CM   1. Metastasis of neoplasm to spinal canal [facility]) C79.49     DIAGNOSIS:  77 yo man with recurrent spinal metastasis at previously treated L3 and newly L1  NARRATIVE:  The patient was brought to the [place].  Identity was confirmed.  All relevant records and images related to the planned course of therapy were reviewed.  The patient freely provided informed written consent to proceed with treatment after reviewing the details related to the planned course of therapy. The consent form was witnessed and verified by the simulation staff.  Then, the patient was set-up in a stable reproducible  supine position for radiation therapy.  A BodyFix immobilization pillow was fabricated for reproducible positioning.  Surface markings were placed.  The CT images were loaded into the planning software.  The gross target volumes (GTV) and planning target volumes (PTV) were delinieated, and avoidance structures were contoured.  Treatment planning then occurred.  The radiation prescription was entered and confirmed.  A total of two complex treatment devices were fabricated in the form of the BodyFix immobilization pillow and a neck accuform cushion.  I have requested : 3D Simulation  I have requested a DVH of the following structures: targets and all normal structures near the target including both kidneys, bowel and targets as noted on the radiation plan to maintain doses in adherence with established limits  SPECIAL TREATMENT PROCEDURE:  The planned course of therapy using radiation constitutes a special treatment procedure. Special care is required in the management of this patient for the following reasons. This treatment constitutes a Special Treatment Procedure  for the following reason: [ Retreatment in a previously radiated area requiring careful monitoring of increased risk of toxicity due to overlap of previous treatment. And also This treatment constitutes a Special Treatment Procedure for the following reason: [ High dose per fraction requiring special monitoring for increased toxicities of treatment including daily imaging..  The special nature of the planned course of radiotherapy will require increased physician supervision and oversight to ensure patient's safety with optimal treatment outcomes.  PLAN:  The patient will receive 18 Gy in 1 fraction to L1 and L3.  ________________________________  Sheral Apley. Tammi Klippel, M.D.

## 2018-10-22 DIAGNOSIS — C7949 Secondary malignant neoplasm of other parts of nervous system: Secondary | ICD-10-CM | POA: Diagnosis not present

## 2018-10-22 DIAGNOSIS — C61 Malignant neoplasm of prostate: Secondary | ICD-10-CM | POA: Diagnosis not present

## 2018-10-22 DIAGNOSIS — Z510 Encounter for antineoplastic radiation therapy: Secondary | ICD-10-CM | POA: Diagnosis not present

## 2018-10-22 DIAGNOSIS — C772 Secondary and unspecified malignant neoplasm of intra-abdominal lymph nodes: Secondary | ICD-10-CM | POA: Diagnosis not present

## 2018-10-22 DIAGNOSIS — Z87891 Personal history of nicotine dependence: Secondary | ICD-10-CM | POA: Diagnosis not present

## 2018-10-22 DIAGNOSIS — C7951 Secondary malignant neoplasm of bone: Secondary | ICD-10-CM | POA: Diagnosis not present

## 2018-10-24 ENCOUNTER — Ambulatory Visit
Admission: RE | Admit: 2018-10-24 | Discharge: 2018-10-24 | Disposition: A | Discharge status: Home / Self Care | Payer: Medicare Other | Source: Ambulatory Visit | Attending: Radiation Oncology | Admitting: Radiation Oncology

## 2018-10-24 VITALS — BP 154/80 | HR 66 | Temp 97.8°F | Resp 18

## 2018-10-24 DIAGNOSIS — C7951 Secondary malignant neoplasm of bone: Secondary | ICD-10-CM | POA: Diagnosis not present

## 2018-10-24 DIAGNOSIS — C61 Malignant neoplasm of prostate: Secondary | ICD-10-CM | POA: Diagnosis not present

## 2018-10-24 DIAGNOSIS — Z87891 Personal history of nicotine dependence: Secondary | ICD-10-CM | POA: Diagnosis not present

## 2018-10-24 DIAGNOSIS — C7949 Secondary malignant neoplasm of other parts of nervous system: Secondary | ICD-10-CM | POA: Diagnosis not present

## 2018-10-24 DIAGNOSIS — Z510 Encounter for antineoplastic radiation therapy: Secondary | ICD-10-CM | POA: Diagnosis not present

## 2018-10-24 DIAGNOSIS — C772 Secondary and unspecified malignant neoplasm of intra-abdominal lymph nodes: Secondary | ICD-10-CM | POA: Diagnosis not present

## 2018-10-24 NOTE — Progress Notes (Signed)
  Radiation Oncology         [phone] ________________________________  Spinal Stereotactic Radiosurgery Procedure Note  Name: ALIN CHAVIRA MRN: [id number]  Date: 10/24/2018  DOB: [birth year]  SPECIAL TREATMENT PROCEDURE    ICD-10-CM   1. Prostate cancer ([place]) C61     3D TREATMENT PLANNING AND DOSIMETRY:  [name] radiation plan was reviewed and approved by neurosurgery and radiation oncology prior to treatment.  It showed 3-dimensional radiation distributions overlaid onto the planning CT/MRI image set.  The [facility] for the target structures as well as the organs at risk were reviewed. The documentation of the 3D plan and dosimetry are filed in the radiation oncology EMR.  NARRATIVE:  ESGAR BARNICK was brought to the TrueBeam stereotactic radiation treatment machine and placed supine on the CT couch. The patient was precisely re-positioned in their BodyFix immobilization device, and the patient was set up for stereotactic radiosurgery.  Neurosurgery was present for the set-up and delivery  SIMULATION VERIFICATION:  In the couch zero-angle position, the patient underwent Exactrac imaging using the Brainlab system with orthogonal KV images to position the target accounting for translation and rotational factors.  These were carefully aligned and repeated to confirm treatment position.  Then, cone beam CT was performed to help further verify placement and make any final translational shifts.  SPECIAL TREATMENT PROCEDURE: [name] received stereotactic radiosurgery to the following targets:  The targeted metastasis in the L1 and L3 vertebral body were each treated using 3 Rapid Arc VMAT Beams to a prescription dose of 18 Gy to the gross disease (GTV) seen on imaging, while a secondary lower prescription dose of 14 Gy was delivered to the entirety of each directly involved marrow compartment of the gross disease (CTV).    The beams were delivered with 6 MV X-rays in the  flattening filter free mode.  STEREOTACTIC TREATMENT MANAGEMENT:  Following delivery, the patient was transported to nursing in stable condition and monitored for possible acute effects.  Vital signs were recorded There were no vitals taken for this visit.. The patient tolerated treatment without significant acute effects, and was discharged to home in stable condition.    PLAN: Follow-up in one month.  ________________________________  Sheral Apley. Tammi Klippel, M.D.

## 2018-10-24 NOTE — CV Procedure (Signed)
Name: [name]  MRN: [id number]  Date: 10/24/2018   DOB: Jul 19, 1941  Stereotactic Radiosurgery Operative Note  PRE-OPERATIVE DIAGNOSIS:  Spinal Metastasis  POST-OPERATIVE DIAGNOSIS:  Spinal Metastasis  PROCEDURE:  Stereotactic Radiosurgery  SURGEON:  Cathaleen Korol P, MD  NARRATIVE: The patient underwent a radiation treatment planning session in the radiation oncology simulation suite under the care of the radiation oncology physician and physicist.  I participated closely in the radiation treatment planning afterwards. The patient underwent planning CT myelogram which was fused to the MRI.  These images were fused on the planning system.  Radiation oncology contoured the gross target volume and subsequently expanded this to yield the Planning Target Volume. I actively participated in the planning process.  I helped to define and review the target contours and also the contours of the spinal cord, and selected nearby organs at risk.  All the dose constraints for critical structures were reviewed and compared to AAPM Task Group 101.  The prescription dose conformity was reviewed.  I approved the plan electronically.    Accordingly, [name] was brought to the TrueBeam stereotactic radiation treatment linac and placed in the custom immobilization device.  The patient was aligned according to the IR fiducial markers with BrainLab Exactrac, then orthogonal x-rays were used in ExacTrac with the 6DOF robotic table and the shifts were made to align the patient.  Then conebeam CT was performed to verify precision.  [name] received stereotactic radiosurgery uneventfully.  The detailed description of the procedure is recorded in the radiation oncology procedure note.  I was present for the duration of the procedure.  DISPOSITION:  Following delivery, the patient was transported to nursing in stable condition and monitored for possible acute effects to be discharged to home in stable  condition with follow-up in one month.  [name] P, MD 10/24/2018 8:58 AM

## 2018-10-24 NOTE — Addendum Note (Signed)
Encounter addended by: Heywood Footman, RN on: 10/24/2018 9:45 AM  Actions taken: Sign clinical note

## 2018-10-24 NOTE — Progress Notes (Signed)
Nurse monitoring following SRS treatment complete.  Vitals stable. No distress noted. No new neurologic symptoms noted. Instructed to avoid strenuous activity for the next 24 hours and contact this RN with future needs. Instructed patient to continues Decadron 4 mg bid. No evidence of thrush noted. Patient and wife verbalized understanding of all reviewed. Patient wheeled out by wife. Patient in no distress at discharge.  BP (!) 154/80   Pulse 66   Temp 97.8 F (36.6 C)   Resp 18   SpO2 100%

## 2018-10-25 ENCOUNTER — Telehealth: Payer: Self-pay | Admitting: Radiation Oncology

## 2018-10-25 NOTE — Progress Notes (Signed)
Phoned to inquire of status after SRS treatment to spine yesterday. Spoke with patient's wife, Bethena Roys. She reports the patient is doing very well. Assured her once Dr. [name] responded about the decadron taper I would phone back with further instructions. She verbalized understanding and expressed appreciation for the call

## 2018-10-27 DIAGNOSIS — Z9181 History of falling: Secondary | ICD-10-CM | POA: Diagnosis not present

## 2018-10-27 DIAGNOSIS — L89152 Pressure ulcer of sacral region, stage 2: Secondary | ICD-10-CM | POA: Diagnosis not present

## 2018-10-27 DIAGNOSIS — N400 Enlarged prostate without lower urinary tract symptoms: Secondary | ICD-10-CM | POA: Diagnosis not present

## 2018-10-27 DIAGNOSIS — E785 Hyperlipidemia, unspecified: Secondary | ICD-10-CM | POA: Diagnosis not present

## 2018-10-27 DIAGNOSIS — Z86010 Personal history of colonic polyps: Secondary | ICD-10-CM | POA: Diagnosis not present

## 2018-10-27 DIAGNOSIS — K2270 Barrett's esophagus without dysplasia: Secondary | ICD-10-CM | POA: Diagnosis not present

## 2018-10-27 DIAGNOSIS — M8458XD Pathological fracture in neoplastic disease, other specified site, subsequent encounter for fracture with routine healing: Secondary | ICD-10-CM | POA: Diagnosis not present

## 2018-10-27 DIAGNOSIS — K219 Gastro-esophageal reflux disease without esophagitis: Secondary | ICD-10-CM | POA: Diagnosis not present

## 2018-10-27 DIAGNOSIS — I10 Essential (primary) hypertension: Secondary | ICD-10-CM | POA: Diagnosis not present

## 2018-10-27 DIAGNOSIS — Z79891 Long term (current) use of opiate analgesic: Secondary | ICD-10-CM | POA: Diagnosis not present

## 2018-10-27 DIAGNOSIS — Z96643 Presence of artificial hip joint, bilateral: Secondary | ICD-10-CM | POA: Diagnosis not present

## 2018-10-27 DIAGNOSIS — Z87891 Personal history of nicotine dependence: Secondary | ICD-10-CM | POA: Diagnosis not present

## 2018-10-27 DIAGNOSIS — M1990 Unspecified osteoarthritis, unspecified site: Secondary | ICD-10-CM | POA: Diagnosis not present

## 2018-10-27 DIAGNOSIS — C7951 Secondary malignant neoplasm of bone: Secondary | ICD-10-CM | POA: Diagnosis not present

## 2018-10-27 DIAGNOSIS — M48061 Spinal stenosis, lumbar region without neurogenic claudication: Secondary | ICD-10-CM | POA: Diagnosis not present

## 2018-10-27 DIAGNOSIS — C61 Malignant neoplasm of prostate: Secondary | ICD-10-CM | POA: Diagnosis not present

## 2018-10-29 ENCOUNTER — Encounter: Payer: Self-pay | Admitting: Radiation Oncology

## 2018-10-29 ENCOUNTER — Other Ambulatory Visit: Payer: Self-pay | Admitting: Oncology

## 2018-10-29 ENCOUNTER — Telehealth: Payer: Self-pay | Admitting: Radiation Oncology

## 2018-10-29 DIAGNOSIS — M48061 Spinal stenosis, lumbar region without neurogenic claudication: Secondary | ICD-10-CM | POA: Diagnosis not present

## 2018-10-29 DIAGNOSIS — M8458XD Pathological fracture in neoplastic disease, other specified site, subsequent encounter for fracture with routine healing: Secondary | ICD-10-CM | POA: Diagnosis not present

## 2018-10-29 DIAGNOSIS — L89152 Pressure ulcer of sacral region, stage 2: Secondary | ICD-10-CM | POA: Diagnosis not present

## 2018-10-29 DIAGNOSIS — C7951 Secondary malignant neoplasm of bone: Secondary | ICD-10-CM | POA: Diagnosis not present

## 2018-10-29 DIAGNOSIS — I10 Essential (primary) hypertension: Secondary | ICD-10-CM | POA: Diagnosis not present

## 2018-10-29 DIAGNOSIS — C61 Malignant neoplasm of prostate: Secondary | ICD-10-CM | POA: Diagnosis not present

## 2018-10-29 NOTE — Telephone Encounter (Signed)
Phoned patient's home. Spoke with patient and his wife, [name]. Provided them with decadron taper instructions per Dr. Johny Shears order. Instructed upon the following:  4 BID for 5 more days then 2 BID for 5 days then 2 daily for 5 days then stop  Also, instructed them to phone with needs or development of new neurologic symptoms. Both verbalized understanding.

## 2018-10-29 NOTE — Progress Notes (Signed)
°  Radiation Oncology         [phone] ________________________________  Name: [name] MRN: [id number]  Date: 10/29/2018  DOB: [birth year]  End of Treatment Note  Diagnosis:   77 yo man with recurrent spinal metastasis at previously treated L3 and newly L1     Indication for treatment:  Palliative SRS      Radiation treatment dates:   10/24/18  Site/dose and Beams/energy:   The targeted metastasis in the L1 and L3 vertebral body were each treated using 3 Rapid Arc VMAT Beams to a prescription dose of 18 Gy to the gross disease (GTV) seen on imaging, while a secondary lower prescription dose of 14 Gy was delivered to the entirety of each directly involved marrow compartment of the gross disease (CTV).  The beams were delivered with 6 MV X-rays in the flattening filter free mode.  Narrative: The patient tolerated radiation treatment relatively well.     Plan: The patient has completed radiation treatment. The patient will return to radiation oncology clinic for routine followup in one month. I advised him to call or return sooner if he has any questions or concerns related to his recovery or treatment. ________________________________  Sheral Apley. Tammi Klippel, M.D.  This document serves as a record of services personally performed by Tyler Pita, MD. It was created on his behalf by Wilburn Mylar, a trained medical scribe. The creation of this record is based on the scribe's personal observations and the provider's statements to them. This document has been checked and approved by the attending provider.

## 2018-10-29 NOTE — Telephone Encounter (Signed)
-----   Message from Tyler Pita, MD sent at 10/25/2018  4:12 PM EST ----- Regarding: RE: Update with questions 4 BID for 5 more days then 2 BID for 5 days then 2 daily for 5 days then stop   ----- Message ----- From: Heywood Footman, RN Sent: 10/25/2018  12:04 PM EST To: Tyler Pita, MD, Freeman Caldron, PA-C Subject: FW: Update with questions                      Please advise. Thank you. Sam ----- Message ----- From: [name], MD Sent: 10/25/2018  10:53 AM EST To: Heywood Footman, RN Subject: RE: Update with questions                      I do not recall giving him decadron. Please taper at your discretion.  Thanks, FS ----- Message ----- From: Heywood Footman, RN Sent: 10/25/2018  10:42 AM EST To: Tyler Pita, MD, Freeman Caldron, PA-C, # Subject: Update with questions                          Patient received SRS to his spine yesterday. Typically a Medrol dose pack is prescribed to avoid pain flare. However, patient already taking Decadron 4 mg bid prescribed by Dr. [name]. Patient scheduled to follow up with Dr. [name] again on 11/09/2018.  Dr. [name]. Would you like to provide the patient with taper instructions? If so, I would be glad to phone them with those directions or would you prefer to wait until follow up on 11/22?  Dr. Tammi Klippel. Patient is questioning how you decided on 1 treatment opposed to 5.   Sam

## 2018-10-31 DIAGNOSIS — I10 Essential (primary) hypertension: Secondary | ICD-10-CM | POA: Diagnosis not present

## 2018-10-31 DIAGNOSIS — M8458XD Pathological fracture in neoplastic disease, other specified site, subsequent encounter for fracture with routine healing: Secondary | ICD-10-CM | POA: Diagnosis not present

## 2018-10-31 DIAGNOSIS — L89152 Pressure ulcer of sacral region, stage 2: Secondary | ICD-10-CM | POA: Diagnosis not present

## 2018-10-31 DIAGNOSIS — C7951 Secondary malignant neoplasm of bone: Secondary | ICD-10-CM | POA: Diagnosis not present

## 2018-10-31 DIAGNOSIS — C61 Malignant neoplasm of prostate: Secondary | ICD-10-CM | POA: Diagnosis not present

## 2018-10-31 DIAGNOSIS — M48061 Spinal stenosis, lumbar region without neurogenic claudication: Secondary | ICD-10-CM | POA: Diagnosis not present

## 2018-11-02 DIAGNOSIS — I10 Essential (primary) hypertension: Secondary | ICD-10-CM | POA: Diagnosis not present

## 2018-11-02 DIAGNOSIS — C7951 Secondary malignant neoplasm of bone: Secondary | ICD-10-CM | POA: Diagnosis not present

## 2018-11-02 DIAGNOSIS — M48061 Spinal stenosis, lumbar region without neurogenic claudication: Secondary | ICD-10-CM | POA: Diagnosis not present

## 2018-11-02 DIAGNOSIS — C61 Malignant neoplasm of prostate: Secondary | ICD-10-CM | POA: Diagnosis not present

## 2018-11-02 DIAGNOSIS — L89152 Pressure ulcer of sacral region, stage 2: Secondary | ICD-10-CM | POA: Diagnosis not present

## 2018-11-02 DIAGNOSIS — M8458XD Pathological fracture in neoplastic disease, other specified site, subsequent encounter for fracture with routine healing: Secondary | ICD-10-CM | POA: Diagnosis not present

## 2018-11-05 DIAGNOSIS — I10 Essential (primary) hypertension: Secondary | ICD-10-CM | POA: Diagnosis not present

## 2018-11-05 DIAGNOSIS — L89152 Pressure ulcer of sacral region, stage 2: Secondary | ICD-10-CM | POA: Diagnosis not present

## 2018-11-05 DIAGNOSIS — M48061 Spinal stenosis, lumbar region without neurogenic claudication: Secondary | ICD-10-CM | POA: Diagnosis not present

## 2018-11-05 DIAGNOSIS — M8458XD Pathological fracture in neoplastic disease, other specified site, subsequent encounter for fracture with routine healing: Secondary | ICD-10-CM | POA: Diagnosis not present

## 2018-11-05 DIAGNOSIS — C61 Malignant neoplasm of prostate: Secondary | ICD-10-CM | POA: Diagnosis not present

## 2018-11-05 DIAGNOSIS — C7951 Secondary malignant neoplasm of bone: Secondary | ICD-10-CM | POA: Diagnosis not present

## 2018-11-07 DIAGNOSIS — C61 Malignant neoplasm of prostate: Secondary | ICD-10-CM | POA: Diagnosis not present

## 2018-11-07 DIAGNOSIS — C7951 Secondary malignant neoplasm of bone: Secondary | ICD-10-CM | POA: Diagnosis not present

## 2018-11-07 DIAGNOSIS — L89152 Pressure ulcer of sacral region, stage 2: Secondary | ICD-10-CM | POA: Diagnosis not present

## 2018-11-07 DIAGNOSIS — M48061 Spinal stenosis, lumbar region without neurogenic claudication: Secondary | ICD-10-CM | POA: Diagnosis not present

## 2018-11-07 DIAGNOSIS — M8458XD Pathological fracture in neoplastic disease, other specified site, subsequent encounter for fracture with routine healing: Secondary | ICD-10-CM | POA: Diagnosis not present

## 2018-11-07 DIAGNOSIS — I10 Essential (primary) hypertension: Secondary | ICD-10-CM | POA: Diagnosis not present

## 2018-11-08 ENCOUNTER — Other Ambulatory Visit: Payer: Self-pay | Admitting: Oncology

## 2018-11-08 DIAGNOSIS — C61 Malignant neoplasm of prostate: Secondary | ICD-10-CM | POA: Diagnosis not present

## 2018-11-08 DIAGNOSIS — C7951 Secondary malignant neoplasm of bone: Secondary | ICD-10-CM | POA: Diagnosis not present

## 2018-11-08 DIAGNOSIS — I10 Essential (primary) hypertension: Secondary | ICD-10-CM | POA: Diagnosis not present

## 2018-11-08 DIAGNOSIS — M8458XD Pathological fracture in neoplastic disease, other specified site, subsequent encounter for fracture with routine healing: Secondary | ICD-10-CM | POA: Diagnosis not present

## 2018-11-08 DIAGNOSIS — M48061 Spinal stenosis, lumbar region without neurogenic claudication: Secondary | ICD-10-CM | POA: Diagnosis not present

## 2018-11-08 DIAGNOSIS — L89152 Pressure ulcer of sacral region, stage 2: Secondary | ICD-10-CM | POA: Diagnosis not present

## 2018-11-09 ENCOUNTER — Inpatient Hospital Stay (HOSPITAL_BASED_OUTPATIENT_CLINIC_OR_DEPARTMENT_OTHER): Payer: Medicare Other | Admitting: Oncology

## 2018-11-09 ENCOUNTER — Inpatient Hospital Stay: Payer: Medicare Other | Attending: Oncology

## 2018-11-09 ENCOUNTER — Telehealth: Payer: Self-pay

## 2018-11-09 ENCOUNTER — Inpatient Hospital Stay: Payer: Medicare Other

## 2018-11-09 VITALS — BP 122/81 | HR 93 | Temp 97.9°F | Resp 18 | Ht 71.0 in | Wt 186.7 lb

## 2018-11-09 DIAGNOSIS — C7951 Secondary malignant neoplasm of bone: Secondary | ICD-10-CM

## 2018-11-09 DIAGNOSIS — Z79899 Other long term (current) drug therapy: Secondary | ICD-10-CM | POA: Insufficient documentation

## 2018-11-09 DIAGNOSIS — Z923 Personal history of irradiation: Secondary | ICD-10-CM | POA: Insufficient documentation

## 2018-11-09 DIAGNOSIS — Z791 Long term (current) use of non-steroidal anti-inflammatories (NSAID): Secondary | ICD-10-CM | POA: Insufficient documentation

## 2018-11-09 DIAGNOSIS — C61 Malignant neoplasm of prostate: Secondary | ICD-10-CM

## 2018-11-09 DIAGNOSIS — Z8739 Personal history of other diseases of the musculoskeletal system and connective tissue: Secondary | ICD-10-CM | POA: Diagnosis not present

## 2018-11-09 DIAGNOSIS — C7970 Secondary malignant neoplasm of unspecified adrenal gland: Secondary | ICD-10-CM | POA: Insufficient documentation

## 2018-11-09 LAB — CMP (CANCER CENTER ONLY)
ALK PHOS: 131 U/L — AB (ref 38–126)
ALT: 88 U/L — AB (ref 0–44)
AST: 31 U/L (ref 15–41)
Albumin: 3 g/dL — ABNORMAL LOW (ref 3.5–5.0)
Anion gap: 10 (ref 5–15)
BILIRUBIN TOTAL: 0.6 mg/dL (ref 0.3–1.2)
BUN: 19 mg/dL (ref 8–23)
CALCIUM: 9.2 mg/dL (ref 8.9–10.3)
CHLORIDE: 105 mmol/L (ref 98–111)
CO2: 21 mmol/L — ABNORMAL LOW (ref 22–32)
CREATININE: 0.76 mg/dL (ref 0.61–1.24)
GFR, Est AFR Am: >60 mL/min (ref >60)
GFR, Estimated: >60 mL/min (ref >60)
Glucose, Bld: 110 mg/dL — ABNORMAL HIGH (ref 70–99)
Potassium: 4.5 mmol/L (ref 3.5–5.1)
Sodium: 136 mmol/L (ref 135–145)
Total Protein: 5.5 g/dL — ABNORMAL LOW (ref 6.5–8.1)

## 2018-11-09 LAB — CBC WITH DIFFERENTIAL (CANCER CENTER ONLY)
Abs Immature Granulocytes: 0.09 10*3/uL — ABNORMAL HIGH (ref 0.00–0.07)
BASOS PCT: 0 %
Basophils Absolute: 0 10*3/uL (ref 0.0–0.1)
EOS ABS: 0 10*3/uL (ref 0.0–0.5)
Eosinophils Relative: 0 %
HEMATOCRIT: 40.5 % (ref 39.0–52.0)
Hemoglobin: 13.4 g/dL (ref 13.0–17.0)
IMMATURE GRANULOCYTES: 1 %
LYMPHS PCT: 4 %
Lymphs Abs: 0.3 10*3/uL — ABNORMAL LOW (ref 0.7–4.0)
MCH: 31.5 pg (ref 26.0–34.0)
MCHC: 33.1 g/dL (ref 30.0–36.0)
MCV: 95.3 fL (ref 80.0–100.0)
Monocytes Absolute: 0.5 10*3/uL (ref 0.1–1.0)
Monocytes Relative: 7 %
Neutro Abs: 6.2 10*3/uL (ref 1.7–7.7)
Neutrophils Relative %: 88 %
PLATELETS: 183 10*3/uL (ref 150–400)
RBC: 4.25 MIL/uL (ref 4.22–5.81)
RDW: 15.3 % (ref 11.5–15.5)
WBC Count: 7.1 10*3/uL (ref 4.0–10.5)
nRBC: 0 % (ref 0.0–0.2)

## 2018-11-09 MED ORDER — DENOSUMAB 120 MG/1.7ML ~~LOC~~ SOLN
120.0000 mg | Freq: Once | SUBCUTANEOUS | Status: AC
  Administered 2018-11-09: 120 mg via SUBCUTANEOUS

## 2018-11-09 NOTE — Patient Instructions (Signed)

## 2018-11-09 NOTE — Telephone Encounter (Signed)
Printed avs and calender of upcoming appointment. Per 11/22 los

## 2018-11-09 NOTE — Progress Notes (Signed)
Hematology and Oncology Follow Up Visit  [name] [id number] 1941/02/08 77 y.o. 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  He developed advanced disease with lymphadenopathy and adrenal metastasis in January 2019.  He was found to have at the time of diagnosis.   Prior Therapy: He was treated there with external beam radiation and brachii therapy in January 2017 and completed 2 years of androgen deprivation.  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  Metastatic work-up at that time showed bone involvement as well as lymphadenopathy.    He was treated under the care of Dr. [name] at [facility] initially with enzalutamide started in May 2018 until January 2019 where he presented with progression of disease with bony metastasis, adrenal nodule as well as worsening adenopathy.    Taxotere chemotherapy initially at 60 mg/m and that was reduced to 45 mg/m after cycle 6.  His last treatment was in July 2019 given at [facility].  Repeat imaging studies showed his lymphadenopathy has improved but he did develop worsening bony metastasis based on a lumbar spine obtained on June 24, 2018.  The MRI showed a complete replacement of L3 vertebral body with tumor the tumor in the ventral epidural space behind L3 greater to the left.  Bulky retroperitoneal adenopathy was slightly improved as mentioned.    He is status post decompressive laminectomy and removal of L3 tumor completed on 08/06/2018.  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  Current therapy:  Zytiga 1000 mg daily started in September 2019.  Androgen deprivation under the care of Dr. [name] last injection  given in August 2019.  Xgeva 120 mg every 2 months.  His next injection scheduled for November 2019.  Interim History: [name] is here for repeat evaluation.  Since last visit, he developed worsening back pain and found to have metastatic disease in L1 and L3 on an MRI obtained on 10/16/2018.  He underwent stereotactic radiosurgery on October 24, 2018.  He reports he tolerated the procedure well with improvement in his pain.  He is currently taking tramadol very infrequently and does very mild dull achy pain but otherwise no sharp pain.  He is able to ambulate with the help of a walker and cane most of the time.  He denies any falls or syncope.  He continues to take Zytiga without any issues.  He denies any new side effects including excessive fatigue or tiredness.  He denies any lower extremity edema.   He does not report any headaches, blurry vision, or seizures.  He denied any confusion or lethargy.  Does not report any fevers, chills or sweats.  Does not report any cough, wheezing or hemoptysis.  Does not report any chest pain, palpitation, orthopnea or leg edema.  Does not report any nausea, vomiting or early satiety.  Does not report any constipation or diarrhea. Does not report any pathological fractures.  Does not report frequency, urgency or hematuria.  Does not report any bleeding or clotting tendency.  Does not report any lymphadenopathy or or easy bruising.  Does not report any anxiety or depression.  Remaining review of systems is negative.    Medications: I have reviewed the patient's  current medications.  Current Outpatient Medications  Medication Sig Dispense Refill  . amLODipine (NORVASC) 10 MG tablet Take 1 tablet (10 mg total) by mouth daily. 30 tablet 0  . Calcium Carb-Cholecalciferol (CALCIUM 1000 + D PO) Take 30 mLs by mouth daily. Calcium 1000 mg / Vit D 1000 iu    . cycloSPORINE (RESTASIS) 0.05 % ophthalmic emulsion Place 1 drop into both eyes 2 (two) times daily.    Marland Kitchen  dexamethasone (DECADRON) 4 MG tablet Take 1 tablet (4 mg total) by mouth 2 (two) times daily. Talk to Dr Osker Mason in 1 week on tapering the daose 60 tablet 0  . docusate sodium (COLACE) 100 MG capsule Take 100 mg by mouth 2 (two) times daily.    Marland Kitchen ezetimibe (ZETIA) 10 MG tablet Take 10 mg by mouth every morning.     Marland Kitchen ibuprofen (ADVIL,MOTRIN) 200 MG tablet Take 200 mg by mouth every 6 (six) hours as needed for moderate pain.    Marland Kitchen leuprolide (LUPRON) 30 MG injection Inject 45 mg into the muscle every 6 (six) months.    . ondansetron (ZOFRAN) 4 MG tablet Take 4 mg by mouth every 8 (eight) hours as needed for nausea or vomiting.    Marland Kitchen oxyCODONE (OXY IR/ROXICODONE) 5 MG immediate release tablet Take 1-2 tablets (5-10 mg total) by mouth every 4 (four) hours as needed for severe pain. 90 tablet 0  . pantoprazole (PROTONIX) 40 MG tablet Take 40 mg by mouth daily after supper.     . polyethylene glycol (MIRALAX / GLYCOLAX) packet Take 17 g by mouth daily as needed for mild constipation. (Patient not taking: Reported on 10/19/2018) 14 each 0  . tamsulosin (FLOMAX) 0.4 MG CAPS capsule Take 0.4 mg by mouth daily.     . traMADol (ULTRAM) 50 MG tablet TAKE 1 TABLET BY MOUTH EVERY 6 HOURS AS NEEDED SEVERE PAIN 20 tablet 0  . triamcinolone cream (KENALOG) 0.1 % Apply 1 application topically daily as needed (dry skin in the winter).     Marland Kitchen ZYTIGA 250 MG tablet TAKE 4 TABLETS (1,000 MG TOTAL) BY MOUTH DAILY. TAKE ON AN EMPTY STOMACH 1 HOUR BEFORE OR 2 HOURS AFTER A MEAL 120 tablet 0   No current facility-administered medications for this visit.      Allergies:  Allergies  Allergen Reactions  . Statins Other (See Comments)    Muscle pain    Past Medical History, Surgical history, Social history, and Family History were reviewed and updated.    Physical Exam: Blood pressure 122/81, pulse 93, temperature 97.9 F (36.6 C), temperature source Oral, resp. rate 18, height 5\' 11"  (1.803 m), weight 186 lb 11.2 oz  (84.7 kg), SpO2 98 %.    ECOG: 1   General appearance: Alert, awake without any distress. Head: Atraumatic without abnormalities Oropharynx: Without any thrush or ulcers. Eyes: No scleral icterus. Lymph nodes: No lymphadenopathy noted in the cervical, supraclavicular, or axillary nodes Heart:regular rate and rhythm, without any murmurs or gallops.   Lung: Clear to auscultation without any rhonchi, wheezes or dullness to percussion. Abdomin: Soft, nontender without any shifting dullness or ascites. Musculoskeletal: No clubbing or cyanosis. Neurological: No motor or sensory deficits. Skin: No rashes or lesions.       Lab Results: Lab Results  Component Value Date   WBC 8.0 10/20/2018   HGB 12.4 (L) 10/20/2018   HCT 38.0 (L) 10/20/2018   MCV 95.5 10/20/2018   PLT 320 10/20/2018     Chemistry  Component Value Date/Time   NA 136 10/20/2018 0315   K 4.9 10/20/2018 0315   CL 106 10/20/2018 0315   CO2 26 10/20/2018 0315   BUN 22 10/20/2018 0315   CREATININE 0.90 10/20/2018 0315   CREATININE 0.74 10/02/2018 0847      Component Value Date/Time   CALCIUM 8.3 (L) 10/20/2018 0315   ALKPHOS 88 10/17/2018 0514   AST 18 10/17/2018 0514   AST 26 10/02/2018 0847   ALT 29 10/17/2018 0514   ALT 57 (H) 10/02/2018 0847   BILITOT 0.6 10/17/2018 0514   BILITOT 0.4 10/02/2018 0847      Results for HOLTEN, SPANO (MRN [id number]) as of 11/09/2018 13:14  Ref. Range 08/23/2018 14:35 10/02/2018 08:47  Prostate Specific Ag, Serum Latest Ref Range: 0.0 - 4.0 ng/mL 31.8 (H) 26.7 (H)    Impression and Plan:  77 year old man with the following:  1.  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  He is currently on Zytiga which he has tolerated reasonably well with improvement in his PSA down to 26.7.  Risks and benefits of continuing this therapy was discussed today.  Alternative therapy including systemic chemotherapy was reviewed and for the time  being he will remain on Zytiga and use chemotherapy for salvage purposes.    2.  Spinal metastasis: He completed stereotactic radiosurgery to the lumbar spine with improvement in his pain.  He will have repeat imaging studies in the future.  3.  Bone directed therapy: He will receive Xgeva today and every 4 to 6 weeks.  Complications associated with this treatment including osteonecrosis of the jaw and hypocalcemia were reiterated.  4.  Androgen deprivation: This will be continued indefinitely.  He is currently receiving under the care of Dr. [name].  5.  Pain: He uses Ultram which has helped his symptoms at this time.  Pain is overall improved.  6.  Lower extremity edema: Resolved after dexamethasone taper.  He is currently on tapering doses which will be discontinued this coming Monday.  7.  Follow-up: We will be in January 2020.  15  minutes was spent with the patient face-to-face today.  More than 50% of time was spent on reviewing his disease status, treatment options and answering question regarding future plan of care.    Zola Button, MD 11/22/20191:11 PM

## 2018-11-10 LAB — PROSTATE-SPECIFIC AG, SERUM (LABCORP): PROSTATE SPECIFIC AG, SERUM: 24.2 ng/mL — AB (ref 0.0–4.0)

## 2018-11-12 ENCOUNTER — Telehealth: Payer: Self-pay

## 2018-11-12 ENCOUNTER — Other Ambulatory Visit: Payer: Self-pay | Admitting: Oncology

## 2018-11-12 DIAGNOSIS — C61 Malignant neoplasm of prostate: Secondary | ICD-10-CM | POA: Diagnosis not present

## 2018-11-12 DIAGNOSIS — C7951 Secondary malignant neoplasm of bone: Secondary | ICD-10-CM | POA: Diagnosis not present

## 2018-11-12 DIAGNOSIS — I10 Essential (primary) hypertension: Secondary | ICD-10-CM | POA: Diagnosis not present

## 2018-11-12 DIAGNOSIS — M8458XD Pathological fracture in neoplastic disease, other specified site, subsequent encounter for fracture with routine healing: Secondary | ICD-10-CM | POA: Diagnosis not present

## 2018-11-12 DIAGNOSIS — M48061 Spinal stenosis, lumbar region without neurogenic claudication: Secondary | ICD-10-CM | POA: Diagnosis not present

## 2018-11-12 DIAGNOSIS — L89152 Pressure ulcer of sacral region, stage 2: Secondary | ICD-10-CM | POA: Diagnosis not present

## 2018-11-12 NOTE — Telephone Encounter (Signed)
Contacted patient and made aware of PSA results. 

## 2018-11-12 NOTE — Telephone Encounter (Signed)
-----   Message from [name], MD sent at 11/12/2018  8:14 AM EST ----- Please let him know his PSA is down.

## 2018-11-13 DIAGNOSIS — H2511 Age-related nuclear cataract, right eye: Secondary | ICD-10-CM | POA: Diagnosis not present

## 2018-11-13 DIAGNOSIS — H02831 Dermatochalasis of right upper eyelid: Secondary | ICD-10-CM | POA: Diagnosis not present

## 2018-11-13 DIAGNOSIS — H25013 Cortical age-related cataract, bilateral: Secondary | ICD-10-CM | POA: Diagnosis not present

## 2018-11-13 DIAGNOSIS — H25043 Posterior subcapsular polar age-related cataract, bilateral: Secondary | ICD-10-CM | POA: Diagnosis not present

## 2018-11-13 DIAGNOSIS — H2513 Age-related nuclear cataract, bilateral: Secondary | ICD-10-CM | POA: Diagnosis not present

## 2018-11-13 DIAGNOSIS — H18413 Arcus senilis, bilateral: Secondary | ICD-10-CM | POA: Diagnosis not present

## 2018-11-14 DIAGNOSIS — L89152 Pressure ulcer of sacral region, stage 2: Secondary | ICD-10-CM | POA: Diagnosis not present

## 2018-11-14 DIAGNOSIS — M48061 Spinal stenosis, lumbar region without neurogenic claudication: Secondary | ICD-10-CM | POA: Diagnosis not present

## 2018-11-14 DIAGNOSIS — I10 Essential (primary) hypertension: Secondary | ICD-10-CM | POA: Diagnosis not present

## 2018-11-14 DIAGNOSIS — C61 Malignant neoplasm of prostate: Secondary | ICD-10-CM | POA: Diagnosis not present

## 2018-11-14 DIAGNOSIS — M8458XD Pathological fracture in neoplastic disease, other specified site, subsequent encounter for fracture with routine healing: Secondary | ICD-10-CM | POA: Diagnosis not present

## 2018-11-14 DIAGNOSIS — C7951 Secondary malignant neoplasm of bone: Secondary | ICD-10-CM | POA: Diagnosis not present

## 2018-11-16 DIAGNOSIS — M48061 Spinal stenosis, lumbar region without neurogenic claudication: Secondary | ICD-10-CM | POA: Diagnosis not present

## 2018-11-16 DIAGNOSIS — L89152 Pressure ulcer of sacral region, stage 2: Secondary | ICD-10-CM | POA: Diagnosis not present

## 2018-11-16 DIAGNOSIS — C61 Malignant neoplasm of prostate: Secondary | ICD-10-CM | POA: Diagnosis not present

## 2018-11-16 DIAGNOSIS — C7951 Secondary malignant neoplasm of bone: Secondary | ICD-10-CM | POA: Diagnosis not present

## 2018-11-16 DIAGNOSIS — I10 Essential (primary) hypertension: Secondary | ICD-10-CM | POA: Diagnosis not present

## 2018-11-16 DIAGNOSIS — M8458XD Pathological fracture in neoplastic disease, other specified site, subsequent encounter for fracture with routine healing: Secondary | ICD-10-CM | POA: Diagnosis not present

## 2018-11-19 DIAGNOSIS — M48061 Spinal stenosis, lumbar region without neurogenic claudication: Secondary | ICD-10-CM | POA: Diagnosis not present

## 2018-11-19 DIAGNOSIS — M8458XD Pathological fracture in neoplastic disease, other specified site, subsequent encounter for fracture with routine healing: Secondary | ICD-10-CM | POA: Diagnosis not present

## 2018-11-19 DIAGNOSIS — C61 Malignant neoplasm of prostate: Secondary | ICD-10-CM | POA: Diagnosis not present

## 2018-11-19 DIAGNOSIS — I10 Essential (primary) hypertension: Secondary | ICD-10-CM | POA: Diagnosis not present

## 2018-11-19 DIAGNOSIS — C7951 Secondary malignant neoplasm of bone: Secondary | ICD-10-CM | POA: Diagnosis not present

## 2018-11-19 DIAGNOSIS — L89152 Pressure ulcer of sacral region, stage 2: Secondary | ICD-10-CM | POA: Diagnosis not present

## 2018-11-20 ENCOUNTER — Other Ambulatory Visit: Payer: Self-pay | Admitting: Oncology

## 2018-11-20 DIAGNOSIS — M8458XD Pathological fracture in neoplastic disease, other specified site, subsequent encounter for fracture with routine healing: Secondary | ICD-10-CM | POA: Diagnosis not present

## 2018-11-20 DIAGNOSIS — M48061 Spinal stenosis, lumbar region without neurogenic claudication: Secondary | ICD-10-CM | POA: Diagnosis not present

## 2018-11-20 DIAGNOSIS — I10 Essential (primary) hypertension: Secondary | ICD-10-CM | POA: Diagnosis not present

## 2018-11-20 DIAGNOSIS — C7951 Secondary malignant neoplasm of bone: Secondary | ICD-10-CM | POA: Diagnosis not present

## 2018-11-20 DIAGNOSIS — C61 Malignant neoplasm of prostate: Secondary | ICD-10-CM | POA: Diagnosis not present

## 2018-11-20 DIAGNOSIS — L89152 Pressure ulcer of sacral region, stage 2: Secondary | ICD-10-CM | POA: Diagnosis not present

## 2018-11-20 MED FILL — ZYTIGA 250 MG TABLET: 250 | 30 days supply | Qty: 120 | Fill #0

## 2018-11-23 DIAGNOSIS — C61 Malignant neoplasm of prostate: Secondary | ICD-10-CM | POA: Diagnosis not present

## 2018-11-23 DIAGNOSIS — I10 Essential (primary) hypertension: Secondary | ICD-10-CM | POA: Diagnosis not present

## 2018-11-23 DIAGNOSIS — C7951 Secondary malignant neoplasm of bone: Secondary | ICD-10-CM | POA: Diagnosis not present

## 2018-11-23 DIAGNOSIS — M8458XD Pathological fracture in neoplastic disease, other specified site, subsequent encounter for fracture with routine healing: Secondary | ICD-10-CM | POA: Diagnosis not present

## 2018-11-23 DIAGNOSIS — L89152 Pressure ulcer of sacral region, stage 2: Secondary | ICD-10-CM | POA: Diagnosis not present

## 2018-11-23 DIAGNOSIS — M48061 Spinal stenosis, lumbar region without neurogenic claudication: Secondary | ICD-10-CM | POA: Diagnosis not present

## 2018-11-27 MED FILL — TAMSULOSIN HCL 0.4 MG CAP: 0.4 | 90 days supply | Qty: 90 | Fill #2

## 2018-11-28 DIAGNOSIS — C61 Malignant neoplasm of prostate: Secondary | ICD-10-CM | POA: Diagnosis not present

## 2018-11-28 DIAGNOSIS — M8458XD Pathological fracture in neoplastic disease, other specified site, subsequent encounter for fracture with routine healing: Secondary | ICD-10-CM | POA: Diagnosis not present

## 2018-11-28 DIAGNOSIS — L89152 Pressure ulcer of sacral region, stage 2: Secondary | ICD-10-CM | POA: Diagnosis not present

## 2018-11-28 DIAGNOSIS — I10 Essential (primary) hypertension: Secondary | ICD-10-CM | POA: Diagnosis not present

## 2018-11-28 DIAGNOSIS — C7951 Secondary malignant neoplasm of bone: Secondary | ICD-10-CM | POA: Diagnosis not present

## 2018-11-28 DIAGNOSIS — M48061 Spinal stenosis, lumbar region without neurogenic claudication: Secondary | ICD-10-CM | POA: Diagnosis not present

## 2018-12-03 DIAGNOSIS — H2511 Age-related nuclear cataract, right eye: Secondary | ICD-10-CM | POA: Diagnosis not present

## 2018-12-04 DIAGNOSIS — H2512 Age-related nuclear cataract, left eye: Secondary | ICD-10-CM | POA: Diagnosis not present

## 2018-12-05 ENCOUNTER — Other Ambulatory Visit: Payer: Self-pay | Admitting: Oncology

## 2018-12-05 ENCOUNTER — Other Ambulatory Visit: Payer: Self-pay | Admitting: *Deleted

## 2018-12-05 DIAGNOSIS — L89152 Pressure ulcer of sacral region, stage 2: Secondary | ICD-10-CM | POA: Diagnosis not present

## 2018-12-05 DIAGNOSIS — C61 Malignant neoplasm of prostate: Secondary | ICD-10-CM | POA: Diagnosis not present

## 2018-12-05 DIAGNOSIS — M8458XD Pathological fracture in neoplastic disease, other specified site, subsequent encounter for fracture with routine healing: Secondary | ICD-10-CM | POA: Diagnosis not present

## 2018-12-05 DIAGNOSIS — C7951 Secondary malignant neoplasm of bone: Secondary | ICD-10-CM | POA: Diagnosis not present

## 2018-12-05 DIAGNOSIS — M48061 Spinal stenosis, lumbar region without neurogenic claudication: Secondary | ICD-10-CM | POA: Diagnosis not present

## 2018-12-05 DIAGNOSIS — I10 Essential (primary) hypertension: Secondary | ICD-10-CM | POA: Diagnosis not present

## 2018-12-05 MED ORDER — OXYCODONE HCL 5 MG PO TABS: 5.0000 mg | ORAL_TABLET | ORAL | 0 refills | Status: DC | PRN

## 2018-12-10 DIAGNOSIS — H2511 Age-related nuclear cataract, right eye: Secondary | ICD-10-CM | POA: Diagnosis not present

## 2018-12-10 DIAGNOSIS — Z961 Presence of intraocular lens: Secondary | ICD-10-CM | POA: Diagnosis not present

## 2018-12-11 ENCOUNTER — Other Ambulatory Visit: Payer: Self-pay | Admitting: Oncology

## 2018-12-11 DIAGNOSIS — C61 Malignant neoplasm of prostate: Secondary | ICD-10-CM

## 2018-12-13 DIAGNOSIS — C61 Malignant neoplasm of prostate: Secondary | ICD-10-CM | POA: Diagnosis not present

## 2018-12-13 DIAGNOSIS — M8458XD Pathological fracture in neoplastic disease, other specified site, subsequent encounter for fracture with routine healing: Secondary | ICD-10-CM | POA: Diagnosis not present

## 2018-12-13 DIAGNOSIS — C7951 Secondary malignant neoplasm of bone: Secondary | ICD-10-CM | POA: Diagnosis not present

## 2018-12-13 DIAGNOSIS — L89152 Pressure ulcer of sacral region, stage 2: Secondary | ICD-10-CM | POA: Diagnosis not present

## 2018-12-13 DIAGNOSIS — M48061 Spinal stenosis, lumbar region without neurogenic claudication: Secondary | ICD-10-CM | POA: Diagnosis not present

## 2018-12-13 DIAGNOSIS — I10 Essential (primary) hypertension: Secondary | ICD-10-CM | POA: Diagnosis not present

## 2018-12-13 MED FILL — RESTASIS 0.05% EYE EMULSION: 0.05 | 30 days supply | Qty: 60 | Fill #0

## 2018-12-14 ENCOUNTER — Ambulatory Visit
Admission: RE | Admit: 2018-12-14 | Discharge: 2018-12-14 | Disposition: A | Discharge status: Home / Self Care | Payer: Medicare Other | Source: Ambulatory Visit | Attending: Urology | Admitting: Urology

## 2018-12-14 ENCOUNTER — Other Ambulatory Visit: Payer: Self-pay

## 2018-12-14 ENCOUNTER — Encounter: Payer: Self-pay | Admitting: Urology

## 2018-12-14 VITALS — BP 132/79 | HR 63 | Temp 97.6°F | Resp 18 | Wt 194.0 lb

## 2018-12-14 DIAGNOSIS — C61 Malignant neoplasm of prostate: Secondary | ICD-10-CM | POA: Diagnosis not present

## 2018-12-14 DIAGNOSIS — Z923 Personal history of irradiation: Secondary | ICD-10-CM | POA: Insufficient documentation

## 2018-12-14 DIAGNOSIS — Z79899 Other long term (current) drug therapy: Secondary | ICD-10-CM | POA: Diagnosis not present

## 2018-12-14 DIAGNOSIS — C7951 Secondary malignant neoplasm of bone: Secondary | ICD-10-CM | POA: Insufficient documentation

## 2018-12-14 HISTORY — DX: Unspecified cataract: H26.9

## 2018-12-14 MED FILL — ZYTIGA 250 MG TABLET: 250 | 30 days supply | Qty: 120 | Fill #0

## 2018-12-14 NOTE — Progress Notes (Addendum)
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   09/04/2018 - 09/17/2018:The lumbar spine target at L3-L4 was treated to 30 Gy in 10 fractions of 3 Gy.  11/05/2015 - 12/10/2015: 45 Gy in 25 fractions to the pelvis followed by LDR boost, 110 Gy on 12/23/2015 at [facility] c/o Dr. [name].  Narrative:  The patient returns today for routine follow-up. He tolerated his recent radiation treatment relatively well.        In summary, he was initially diagnosed with Gleason 5+4 adenocarcinoma of the prostate with a PSA of 13.66 in July 2016 under the care of Dr. [name].  He elected to proceed with a 5-week course of external beam radiotherapy followed by brachytherapy boost at [facility] with Dr. [name] between 11/05/15 - 12/2015 in combination with 2 years of androgen deprivation therapy.  PSA nadired at 1.0 in 07/2016. 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  Metastatic work-up at that time showed bone involvement as well as lymphadenopathy.  He was treated under the care of Dr. [name] at [facility], initially with enzalutamide, which was started in May 2018 and discontinued in January 2019 when he presented with progression of disease with increased  bony metastasis, adrenal nodule and worsening lymphadenopathy.  At that time, his treatment was changed to Taxotere chemotherapy with his last treatment in July 2019- discontinued due to disease progression noted on repeat systemic imaging which showed a partial response with slight improvement in the bulky retroperitoneal lymphadenopathy but worsening bony metastasis based on a lumbar spine MRI obtained on June 24, 2018.  The MRI showed a complete replacement of L3 vertebral body with tumor in the ventral epidural space behind L3, greater to the left. He elected to transfer his care to Dr. [name] at the [facility] since this is closer to home and has continued on androgen deprivation therapy with Lupron under the care of Dr. [name] with his last 37-month injection given in August 2019.  He has also been receiving denosumab Delton See) every 6 weeks with his next injection scheduled for 10/2018.   Zytiga was added to his treatment regimen on 08/28/2018.          Due to progressive low back pain, he elected to proceed with surgical decompressive laminectomy on the left at L3 with complete removal of the left L3 lamina, microscopic foraminotomies of the left L3 and L4 nerve roots and microscopic removal of epidural tumor on the left under the care of Dr. [name] on 08/06/2018.  Final surgical pathology confirmed high-grade metastatic prostate adenocarcinoma.  He completed postoperative palliative radiotherapy to the lumbar spine at the levels of L3-L4 on 09/17/18.   He did note improvement in his back  pain following treatment but continued to struggle with weakness and limitations in mobility.     Over time, his pain became progressively severe requiring him to present to the emergency department on 10/16/2018 due to intractable pain radiating into the lower extremities with progressive weakness.  He had an MRI of the lumbar spine on admission which showed new severe L3 pathologic fracture, new L1 osseous  metastasis with worsening retroperitoneal nodal metastasis and epidural tumor with retropulsed bony fragments at L3 resulting in moderate L2-3 and L3-4 canal stenosis as well as neural foraminal narrowing at L1 to through L4-5, moderate on the left at L3-4 due to tumor.  He was started on IV dexamethasone with significant improvement in his symptoms.    He was not felt to be a good candidate for further surgical intervention and therefore elected to proceed with an additional course of palliative radiotherapy to the new sites of disease in the lumbar spine.  On review of systems, the patient states that he is doing very well overall.  He has noted significant improvement in his lower back pain and is now able to ambulate relatively pain-free with the use of a cane.  He denies any numbness or tingling into the lower extremities and denies any radicular pain.  His wife adds that he is able to be much more active at this point with minimal if any discomfort.  He denies any significant fatigue/malaise.  He has noted some mild edema in the bilateral lower extremities over the past several weeks but reports that this is alleviated with elevation of his LEs.  He denies any chest pain, increased shortness of breath, fever, chills or night sweats.  He continues to take the Zytiga daily which he tolerates well.    ALLERGIES:  is allergic to statins.  Meds: Current Outpatient Medications  Medication Sig Dispense Refill  . Besifloxacin HCl (BESIVANCE) 0.6 % SUSP Apply 1 drop to eye 3 (three) times daily.    . Bromfenac Sodium (PROLENSA) 0.07 % SOLN Apply 1 drop to eye once. At bedtime    . Calcium Carb-Cholecalciferol (CALCIUM 1000 + D PO) Take 30 mLs by mouth daily. Calcium 1000 mg / Vit D 1000 iu    . cycloSPORINE (RESTASIS) 0.05 % ophthalmic emulsion Place 1 drop into both eyes 2 (two) times daily.    . Difluprednate (DUREZOL) 0.05 % EMUL Apply 1 drop to eye 3 (three) times daily.    Marland Kitchen docusate sodium (COLACE)  100 MG capsule Take 100 mg by mouth 2 (two) times daily.    Marland Kitchen ezetimibe (ZETIA) 10 MG tablet Take 10 mg by mouth every morning.     Marland Kitchen ibuprofen (ADVIL,MOTRIN) 200 MG tablet Take 200 mg by mouth every 6 (six) hours as needed for moderate pain.    Marland Kitchen leuprolide (LUPRON) 30 MG injection Inject 45 mg into the muscle every 6 (six) months.    Marland Kitchen oxyCODONE (OXY IR/ROXICODONE) 5 MG immediate release tablet Take 1-2 tablets (5-10 mg total) by mouth every 4 (four) hours as needed for severe pain. 90 tablet 0  . pantoprazole (PROTONIX) 40 MG tablet Take 40 mg by mouth daily after supper.     . polyethylene glycol (MIRALAX / GLYCOLAX) packet Take 17 g by mouth daily as needed for mild constipation. 14 each 0  . tamsulosin (FLOMAX) 0.4 MG CAPS capsule Take 0.4 mg by mouth daily.     . traMADol (ULTRAM) 50 MG tablet TAKE 1 TABLET BY MOUTH EVERY 6 HOURS  AS NEEDED FOR SEVERE PAIN 20 tablet 0  . triamcinolone cream (KENALOG) 0.1 % Apply 1 application topically daily as needed (dry skin in the winter).     Marland Kitchen ZYTIGA 250 MG tablet TAKE 4 TABLETS (1,000 MG TOTAL) BY MOUTH DAILY. TAKE ON AN EMPTY STOMACH 1 HOUR BEFORE OR 2 HOURS AFTER A MEAL 120 tablet 0  . amLODipine (NORVASC) 10 MG tablet Take 1 tablet (10 mg total) by mouth daily. (Patient not taking: Reported on 12/14/2018) 30 tablet 0  . dexamethasone (DECADRON) 4 MG tablet Take 1 tablet (4 mg total) by mouth 2 (two) times daily. Talk to Dr Osker Mason in 1 week on tapering the daose (Patient not taking: Reported on 12/14/2018) 60 tablet 0  . ondansetron (ZOFRAN) 4 MG tablet Take 4 mg by mouth every 8 (eight) hours as needed for nausea or vomiting.     No current facility-administered medications for this encounter.     Physical Findings:  weight is 194 lb (88 kg). His temperature is 97.6 F (36.4 C). His blood pressure is 132/79 and his pulse is 63. His respiration is 18 and oxygen saturation is 100%.  Pain Assessment Pain Score: 0-No pain/10 In general this is a  well appearing Caucasian male in no acute distress.  He's alert and oriented x4 and appropriate throughout the examination. Cardiopulmonary assessment is negative for acute distress and he exhibits normal effort.  There is 2+ pittting edema bilaterally in the LEs but no cyanosis, deep calf pain or erythema.  Lab Findings: Lab Results  Component Value Date   WBC 7.1 11/09/2018   HGB 13.4 11/09/2018   HCT 40.5 11/09/2018   MCV 95.3 11/09/2018   PLT 183 11/09/2018     Radiographic Findings: No results found.  Impression/Plan: 52. 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 He appears to have recovered well from the effects of his recent [facility] treatment.  He continues to take Zytiga daily with prednisone 5 mg, under the care and direction of Dr. [name].  He has a scheduled follow-up visit with Dr. [name] on 12/25/2018 and will remain on ADT indefinitely under the care of Dr. [name]- scheduled for his next injection in 01/2019.  He will also continue on Xgeva every 4 to 6 weeks at the direction of Dr. [name].  We will continue to monitor the patient closely for any disease progression or recurrence in the spine with an MRI of the lumbar spine every 3 months.  His case will be presented at the multidisciplinary brain and spine conference prior to a follow-up office visit to review results and recommendations.  He and his wife are comfortable and in agreement with the stated plan.  They know to call with any questions or concerns in the interim.        Nicholos Johns, PA-C

## 2018-12-18 ENCOUNTER — Other Ambulatory Visit: Payer: Self-pay | Admitting: Radiation Therapy

## 2018-12-18 DIAGNOSIS — M48061 Spinal stenosis, lumbar region without neurogenic claudication: Secondary | ICD-10-CM | POA: Diagnosis not present

## 2018-12-18 DIAGNOSIS — C7951 Secondary malignant neoplasm of bone: Secondary | ICD-10-CM

## 2018-12-18 DIAGNOSIS — I10 Essential (primary) hypertension: Secondary | ICD-10-CM | POA: Diagnosis not present

## 2018-12-18 DIAGNOSIS — L89152 Pressure ulcer of sacral region, stage 2: Secondary | ICD-10-CM | POA: Diagnosis not present

## 2018-12-18 DIAGNOSIS — C61 Malignant neoplasm of prostate: Secondary | ICD-10-CM | POA: Diagnosis not present

## 2018-12-18 DIAGNOSIS — M8458XD Pathological fracture in neoplastic disease, other specified site, subsequent encounter for fracture with routine healing: Secondary | ICD-10-CM | POA: Diagnosis not present

## 2018-12-21 ENCOUNTER — Telehealth: Payer: Self-pay

## 2018-12-21 NOTE — Telephone Encounter (Signed)
Oral Oncology Patient Advocate Encounter  I was successful at securing a grant with [employer] for $7,300. This will keep the out of pocket expense for Zytiga at $0. The grant information is as follows and has been shared with [name].  Approval dates: 09/21/18-12/20/19 ID: [id number] Group: [id number] BIN: [id number] PCN: [id number]  Also,  I was successful at securing a grant with [employer] for $7,500. This will keep the out of pocket expense for Zytiga at $0. The grant information is as follows and has been shared with [name].  Approval dates: 12/20/18-12/21/19 ID: [id number] Group: [id number] BIN: [id number] PCN: [id number]  Mr. [name] is aware of the grants   [name] Patient Zachary Phone [phone] Fax [phone]

## 2018-12-25 ENCOUNTER — Inpatient Hospital Stay: Payer: Medicare Other

## 2018-12-25 ENCOUNTER — Telehealth: Payer: Self-pay | Admitting: Oncology

## 2018-12-25 ENCOUNTER — Inpatient Hospital Stay: Payer: Medicare Other | Attending: Oncology | Admitting: Oncology

## 2018-12-25 VITALS — BP 132/80 | HR 80 | Temp 98.1°F | Resp 18 | Ht 71.0 in | Wt 189.9 lb

## 2018-12-25 DIAGNOSIS — C61 Malignant neoplasm of prostate: Secondary | ICD-10-CM | POA: Diagnosis not present

## 2018-12-25 DIAGNOSIS — Z923 Personal history of irradiation: Secondary | ICD-10-CM | POA: Diagnosis not present

## 2018-12-25 DIAGNOSIS — M549 Dorsalgia, unspecified: Secondary | ICD-10-CM | POA: Insufficient documentation

## 2018-12-25 DIAGNOSIS — Z791 Long term (current) use of non-steroidal anti-inflammatories (NSAID): Secondary | ICD-10-CM | POA: Diagnosis not present

## 2018-12-25 DIAGNOSIS — R5383 Other fatigue: Secondary | ICD-10-CM | POA: Diagnosis not present

## 2018-12-25 DIAGNOSIS — Z9221 Personal history of antineoplastic chemotherapy: Secondary | ICD-10-CM

## 2018-12-25 DIAGNOSIS — Z79899 Other long term (current) drug therapy: Secondary | ICD-10-CM | POA: Diagnosis not present

## 2018-12-25 DIAGNOSIS — Z79818 Long term (current) use of other agents affecting estrogen receptors and estrogen levels: Secondary | ICD-10-CM | POA: Diagnosis not present

## 2018-12-25 DIAGNOSIS — C7970 Secondary malignant neoplasm of unspecified adrenal gland: Secondary | ICD-10-CM

## 2018-12-25 DIAGNOSIS — C7951 Secondary malignant neoplasm of bone: Secondary | ICD-10-CM | POA: Insufficient documentation

## 2018-12-25 LAB — CMP (CANCER CENTER ONLY)
ALT: 15 U/L (ref 0–44)
AST: 16 U/L (ref 15–41)
Albumin: 3.2 g/dL — ABNORMAL LOW (ref 3.5–5.0)
Alkaline Phosphatase: 96 U/L (ref 38–126)
Anion gap: 7 (ref 5–15)
BUN: 9 mg/dL (ref 8–23)
CO2: 25 mmol/L (ref 22–32)
Calcium: 8.9 mg/dL (ref 8.9–10.3)
Chloride: 107 mmol/L (ref 98–111)
Creatinine: 0.75 mg/dL (ref 0.61–1.24)
GFR, Estimated: >60 mL/min (ref >60)
Glucose, Bld: 91 mg/dL (ref 70–99)
Potassium: 4.1 mmol/L (ref 3.5–5.1)
SODIUM: 139 mmol/L (ref 135–145)
Total Bilirubin: 0.6 mg/dL (ref 0.3–1.2)
Total Protein: 5.8 g/dL — ABNORMAL LOW (ref 6.5–8.1)

## 2018-12-25 LAB — CBC WITH DIFFERENTIAL (CANCER CENTER ONLY)
Abs Immature Granulocytes: 0.03 10*3/uL (ref 0.00–0.07)
Basophils Absolute: 0.1 10*3/uL (ref 0.0–0.1)
Basophils Relative: 1 %
Eosinophils Absolute: 0.1 10*3/uL (ref 0.0–0.5)
Eosinophils Relative: 2 %
HCT: 37.9 % — ABNORMAL LOW (ref 39.0–52.0)
Hemoglobin: 12.3 g/dL — ABNORMAL LOW (ref 13.0–17.0)
Immature Granulocytes: 1 %
Lymphocytes Relative: 8 %
Lymphs Abs: 0.5 10*3/uL — ABNORMAL LOW (ref 0.7–4.0)
MCH: 31.8 pg (ref 26.0–34.0)
MCHC: 32.5 g/dL (ref 30.0–36.0)
MCV: 97.9 fL (ref 80.0–100.0)
Monocytes Absolute: 0.7 10*3/uL (ref 0.1–1.0)
Monocytes Relative: 12 %
NRBC: 0 % (ref 0.0–0.2)
Neutro Abs: 4.8 10*3/uL (ref 1.7–7.7)
Neutrophils Relative %: 76 %
Platelet Count: 276 10*3/uL (ref 150–400)
RBC: 3.87 MIL/uL — ABNORMAL LOW (ref 4.22–5.81)
RDW: 14 % (ref 11.5–15.5)
WBC Count: 6.3 10*3/uL (ref 4.0–10.5)

## 2018-12-25 MED ORDER — DENOSUMAB 120 MG/1.7ML ~~LOC~~ SOLN
120.0000 mg | Freq: Once | SUBCUTANEOUS | Status: AC
  Administered 2018-12-25: 120 mg via SUBCUTANEOUS

## 2018-12-25 MED ORDER — DENOSUMAB 120 MG/1.7ML ~~LOC~~ SOLN
SUBCUTANEOUS | Status: AC
  Filled 2018-12-25: qty 1.7

## 2018-12-25 NOTE — Progress Notes (Signed)
Hematology and Oncology Follow Up Visit  [name] [id number] May 13, 1941 78 y.o. 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  He presented with Gleason score of 5+4 = 9 and a PSA of 13.56.  Prior Therapy: He was treated there with external beam radiation and brachii therapy in January 2017 and completed 2 years of androgen deprivation.  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  Metastatic work-up at that time showed bone involvement as well as lymphadenopathy.    He was treated under the care of [name] at [facility] initially with enzalutamide started in May 2018 until January 2019 where he presented with progression of disease with bony metastasis, adrenal nodule as well as worsening adenopathy.    Taxotere chemotherapy initially at 60 mg/m and that was reduced to 45 mg/m after cycle 6.  His last treatment was in July 2019 given at [facility].  Repeat imaging studies showed his lymphadenopathy has improved but he did develop worsening bony metastasis based on a lumbar spine obtained on June 24, 2018.  The MRI showed a complete replacement of L3 vertebral body with tumor the tumor in the ventral epidural space behind L3 greater to the left.  Bulky retroperitoneal adenopathy was slightly improved as mentioned.    He is status post decompressive laminectomy and removal of L3 tumor completed on 08/06/2018.  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  Current therapy:  Zytiga 1000 mg daily started in September 2019.  Androgen deprivation under the care of [name] last injection given in August 2019.  Xgeva 120 mg every 2 months.  His next injection  scheduled for November 2019.  Interim History: [name] returns today for a follow-up.  Since the last visit, he reports no major changes in his health.  He continues to ambulate with the help of a cane and a walker without any recent falls.  He continues to have intermittent back pain that has improved over the last few weeks.  He does take Ultram and ibuprofen close to once a day at most.  He still reports some fatigue and increased pain if he stands for an extended period of time.  He denies any complications related to Zytiga.  He denies any worsening edema, excessive fatigue or worsening bone pain.  His quality of life and performance status remains excellent.   He does not report any headaches, blurry vision, or seizures.  He denied any alteration in mental status or dizziness.  Does not report any fevers, chills or sweats.  Does not report any cough, wheezing or hemoptysis.  Does not report any chest pain, palpitation, orthopnea or leg edema.  Does not report any nausea, vomiting or abdominal pain.  Denies any changes in bowel habits. Does not report any arthralgias or myalgias.  Does not report frequency, urgency or hematuria.  Does not report any ecchymosis or petechiae.  Does not report any skin rashes or lymphadenopathy.  Does not report any mood changes.  Remaining review of systems is negative.    Medications: I have reviewed the patient's current medications.  Current Outpatient Medications  Medication Sig Dispense Refill  . amLODipine (NORVASC) 10 MG tablet Take 1 tablet (10 mg total) by mouth daily. (Patient  not taking: Reported on 12/14/2018) 30 tablet 0  . Besifloxacin HCl (BESIVANCE) 0.6 % SUSP Apply 1 drop to eye 3 (three) times daily.    . Bromfenac Sodium (PROLENSA) 0.07 % SOLN Apply 1 drop to eye once. At bedtime    . Calcium Carb-Cholecalciferol (CALCIUM 1000 + D PO) Take 30 mLs by mouth daily. Calcium 1000 mg / Vit D 1000 iu    . cycloSPORINE (RESTASIS) 0.05 % ophthalmic  emulsion Place 1 drop into both eyes 2 (two) times daily.    Marland Kitchen dexamethasone (DECADRON) 4 MG tablet Take 1 tablet (4 mg total) by mouth 2 (two) times daily. Talk to [name] in 1 week on tapering the daose (Patient not taking: Reported on 12/14/2018) 60 tablet 0  . Difluprednate (DUREZOL) 0.05 % EMUL Apply 1 drop to eye 3 (three) times daily.    Marland Kitchen docusate sodium (COLACE) 100 MG capsule Take 100 mg by mouth 2 (two) times daily.    Marland Kitchen ezetimibe (ZETIA) 10 MG tablet Take 10 mg by mouth every morning.     Marland Kitchen ibuprofen (ADVIL,MOTRIN) 200 MG tablet Take 200 mg by mouth every 6 (six) hours as needed for moderate pain.    Marland Kitchen leuprolide (LUPRON) 30 MG injection Inject 45 mg into the muscle every 6 (six) months.    . ondansetron (ZOFRAN) 4 MG tablet Take 4 mg by mouth every 8 (eight) hours as needed for nausea or vomiting.    Marland Kitchen oxyCODONE (OXY IR/ROXICODONE) 5 MG immediate release tablet Take 1-2 tablets (5-10 mg total) by mouth every 4 (four) hours as needed for severe pain. 90 tablet 0  . pantoprazole (PROTONIX) 40 MG tablet Take 40 mg by mouth daily after supper.     . polyethylene glycol (MIRALAX / GLYCOLAX) packet Take 17 g by mouth daily as needed for mild constipation. 14 each 0  . tamsulosin (FLOMAX) 0.4 MG CAPS capsule Take 0.4 mg by mouth daily.     . traMADol (ULTRAM) 50 MG tablet TAKE 1 TABLET BY MOUTH EVERY 6 HOURS AS NEEDED FOR SEVERE PAIN 20 tablet 0  . triamcinolone cream (KENALOG) 0.1 % Apply 1 application topically daily as needed (dry skin in the winter).     Marland Kitchen ZYTIGA 250 MG tablet TAKE 4 TABLETS (1,000 MG TOTAL) BY MOUTH DAILY. TAKE ON AN EMPTY STOMACH 1 HOUR BEFORE OR 2 HOURS AFTER A MEAL 120 tablet 0   No current facility-administered medications for this visit.      Allergies:  Allergies  Allergen Reactions  . Statins Other (See Comments)    Muscle pain    Past Medical History, Surgical history, Social history, and Family History were reviewed and updated.    Physical  Exam:   Blood pressure 132/80, pulse 80, temperature 98.1 F (36.7 C), temperature source Oral, resp. rate 18, height 5\' 11"  (1.803 m), weight 189 lb 14.4 oz (86.1 kg), SpO2 100 %.   ECOG: 1   General appearance: Comfortable appearing without any discomfort Head: Normocephalic without any trauma Oropharynx: Mucous membranes are moist and pink without any thrush or ulcers. Eyes: Pupils are equal and round reactive to light. Lymph nodes: No cervical, supraclavicular, inguinal or axillary lymphadenopathy.   Heart:regular rate and rhythm.  S1 and S2 without leg edema. Lung: Clear without any rhonchi or wheezes.  No dullness to percussion. Abdomin: Soft, nontender, nondistended with good bowel sounds.  No hepatosplenomegaly. Musculoskeletal: No joint deformity or effusion.  Full range of motion noted. Neurological: No deficits  noted on motor, sensory and deep tendon reflex exam. Skin: No petechial rash or dryness.  Appeared moist.         Lab Results: Lab Results  Component Value Date   WBC 6.3 12/25/2018   HGB 12.3 (L) 12/25/2018   HCT 37.9 (L) 12/25/2018   MCV 97.9 12/25/2018   PLT 276 12/25/2018     Chemistry      Component Value Date/Time   NA 136 11/09/2018 1300   K 4.5 11/09/2018 1300   CL 105 11/09/2018 1300   CO2 21 (L) 11/09/2018 1300   BUN 19 11/09/2018 1300   CREATININE 0.76 11/09/2018 1300      Component Value Date/Time   CALCIUM 9.2 11/09/2018 1300   ALKPHOS 131 (H) 11/09/2018 1300   AST 31 11/09/2018 1300   ALT 88 (H) 11/09/2018 1300   BILITOT 0.6 11/09/2018 1300       Results for BLAINE, HARI (MRN [id number]) as of 12/25/2018 11:26  Ref. Range 10/02/2018 08:47 11/09/2018 13:00  Prostate Specific Ag, Serum Latest Ref Range: 0.0 - 4.0 ng/mL 26.7 (H) 24.2 (H)    Impression and Plan:  78 year old man with the following:  1.  Advanced prostate cancer with disease to the bone and lymphadenopathy that is currently Castration-resistant.   He  continues to be on Zytiga without any major complications at this time.  His PSA continues to show modest response and decline currently at 24.2.  Risks and benefits of continuing this therapy long-term was reviewed today and he is agreeable to continue.  Repeat the bone scan and CT scan will be obtained in 2020 special if he develops progression of disease.  He is agreeable with this plan.    2.  Spinal metastasis: Status post radiation therapy.  Repeat MRI of the spine is scheduled for February of this year.  3.  Bone directed therapy: Risks and benefits of continuing Xgeva long-term was discussed today.  Complications including osteonecrosis of the jaw and hypocalcemia were reiterated.  He is agreeable to continue at this time and will receive it every 8 weeks.  4.  Androgen deprivation: I recommended proceeding with this indefinitely.  Is currently receiving under the care of [name].  5.  Pain: Manageable at this time with very little Ultram and ibuprofen.  6.  Prognosis and goals of care: His disease remains incurable is treatable at this time.  His performance status and quality of life remained reasonable and aggressive therapy is warranted.  7.  Follow-up: We will be in March 2020.  25  minutes was spent with the patient face-to-face today.  More than 50% of time was dedicated reviewing his imaging studies, laboratory data, updating his disease status and answer question regarding future plan of care.    Zola Button, MD 1/7/202011:23 AM

## 2018-12-25 NOTE — Patient Instructions (Signed)

## 2018-12-25 NOTE — Telephone Encounter (Signed)
Printed calendar and avs. °

## 2018-12-26 ENCOUNTER — Telehealth: Payer: Self-pay

## 2018-12-26 LAB — PROSTATE-SPECIFIC AG, SERUM (LABCORP): Prostate Specific Ag, Serum: 27.6 ng/mL — ABNORMAL HIGH (ref 0.0–4.0)

## 2018-12-26 NOTE — Telephone Encounter (Signed)
-----   Message from [name], MD sent at 12/26/2018  9:16 AM EST ----- Please let him know his PSA is up slightly. No change is needed.

## 2018-12-26 NOTE — Telephone Encounter (Signed)
Contacted patient and made aware of results. 

## 2018-12-30 ENCOUNTER — Other Ambulatory Visit: Payer: Self-pay | Admitting: Oncology

## 2018-12-31 DIAGNOSIS — H2512 Age-related nuclear cataract, left eye: Secondary | ICD-10-CM | POA: Diagnosis not present

## 2018-12-31 DIAGNOSIS — Z961 Presence of intraocular lens: Secondary | ICD-10-CM | POA: Diagnosis not present

## 2019-01-09 ENCOUNTER — Other Ambulatory Visit: Payer: Self-pay | Admitting: Oncology

## 2019-01-11 ENCOUNTER — Other Ambulatory Visit: Payer: Self-pay | Admitting: Oncology

## 2019-01-11 DIAGNOSIS — C61 Malignant neoplasm of prostate: Secondary | ICD-10-CM

## 2019-01-17 MED FILL — ZYTIGA 250 MG TABLET: 250 | 30 days supply | Qty: 120 | Fill #0

## 2019-01-18 ENCOUNTER — Other Ambulatory Visit: Payer: Self-pay | Admitting: Oncology

## 2019-01-23 ENCOUNTER — Other Ambulatory Visit: Payer: Self-pay | Admitting: Radiation Therapy

## 2019-01-24 ENCOUNTER — Ambulatory Visit (HOSPITAL_COMMUNITY): Admission: RE | Admit: 2019-01-24 | Payer: Medicare Other | Source: Ambulatory Visit

## 2019-01-25 ENCOUNTER — Ambulatory Visit (HOSPITAL_COMMUNITY)
Admission: RE | Admit: 2019-01-25 | Discharge: 2019-01-25 | Disposition: A | Discharge status: Home / Self Care | Payer: Medicare Other | Source: Ambulatory Visit | Attending: Radiation Oncology | Admitting: Radiation Oncology

## 2019-01-25 DIAGNOSIS — C7951 Secondary malignant neoplasm of bone: Secondary | ICD-10-CM | POA: Insufficient documentation

## 2019-01-25 DIAGNOSIS — C801 Malignant (primary) neoplasm, unspecified: Secondary | ICD-10-CM | POA: Diagnosis not present

## 2019-01-25 MED ORDER — GADOBUTROL 1 MMOL/ML IV SOLN
9.0000 mL | Freq: Once | INTRAVENOUS | Status: AC | PRN
  Administered 2019-01-25: 9 mL via INTRAVENOUS

## 2019-01-27 ENCOUNTER — Other Ambulatory Visit: Payer: Self-pay | Admitting: Oncology

## 2019-01-28 ENCOUNTER — Inpatient Hospital Stay: Payer: Medicare Other | Attending: Oncology

## 2019-01-29 ENCOUNTER — Encounter: Payer: Self-pay | Admitting: Urology

## 2019-01-29 ENCOUNTER — Ambulatory Visit
Admission: RE | Admit: 2019-01-29 | Discharge: 2019-01-29 | Disposition: A | Discharge status: Home / Self Care | Payer: Medicare Other | Source: Ambulatory Visit | Attending: Urology | Admitting: Urology

## 2019-01-29 ENCOUNTER — Other Ambulatory Visit: Payer: Self-pay

## 2019-01-29 DIAGNOSIS — C7949 Secondary malignant neoplasm of other parts of nervous system: Secondary | ICD-10-CM

## 2019-01-29 DIAGNOSIS — Z79899 Other long term (current) drug therapy: Secondary | ICD-10-CM | POA: Diagnosis not present

## 2019-01-29 DIAGNOSIS — Z79818 Long term (current) use of other agents affecting estrogen receptors and estrogen levels: Secondary | ICD-10-CM | POA: Diagnosis not present

## 2019-01-29 DIAGNOSIS — Z08 Encounter for follow-up examination after completed treatment for malignant neoplasm: Secondary | ICD-10-CM | POA: Diagnosis not present

## 2019-01-29 DIAGNOSIS — C61 Malignant neoplasm of prostate: Secondary | ICD-10-CM

## 2019-01-29 DIAGNOSIS — C7951 Secondary malignant neoplasm of bone: Secondary | ICD-10-CM | POA: Insufficient documentation

## 2019-01-29 DIAGNOSIS — M48061 Spinal stenosis, lumbar region without neurogenic claudication: Secondary | ICD-10-CM | POA: Insufficient documentation

## 2019-01-29 DIAGNOSIS — Z923 Personal history of irradiation: Secondary | ICD-10-CM | POA: Diagnosis not present

## 2019-01-29 DIAGNOSIS — R9721 Rising PSA following treatment for malignant neoplasm of prostate: Secondary | ICD-10-CM | POA: Diagnosis not present

## 2019-01-30 ENCOUNTER — Ambulatory Visit: Payer: Self-pay | Admitting: Urology

## 2019-01-30 NOTE — Progress Notes (Signed)
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   09/04/2018 - 09/17/2018:The lumbar spine target at L3-L4 was treated to 30 Gy in 10 fractions of 3 Gy.  11/05/2015 - 12/10/2015: 45 Gy in 25 fractions to the pelvis followed by LDR boost, 110 Gy on 12/23/2015 at [facility] c/o Dr. [name].  Narrative:  The patient returns today for routine follow-up and review of recent post-treatment MRI lumbar spine.        In summary, he was initially diagnosed with Gleason 5+4 adenocarcinoma of the prostate with a PSA of 13.66 in July 2016 under the care of Dr. [name].  He elected to proceed with a 5-week course of external beam radiotherapy followed by [facility] with Dr. [name] between 11/05/15 - 12/2015 in combination with 2 years of androgen deprivation therapy.  PSA nadired at 1.0 in 07/2016. 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  Metastatic work-up at that time showed bone involvement as well as lymphadenopathy.  He was treated under the care of Dr. [name] at [facility], initially with enzalutamide, which was started in May 2018 and discontinued in January 2019 when he presented with progression of disease with increased bony  metastasis, adrenal nodule and worsening lymphadenopathy.  At that time, his treatment was changed to Taxotere chemotherapy with his last treatment in July 2019- discontinued due to disease progression noted on repeat systemic imaging which showed a partial response with slight improvement in the bulky retroperitoneal lymphadenopathy but worsening bony metastasis based on a lumbar spine MRI obtained on June 24, 2018.  The MRI showed a complete replacement of L3 vertebral body with tumor in the ventral epidural space behind L3, greater to the left. He elected to transfer his care to Dr. [name] at the [facility] since this is closer to home and has continued on androgen deprivation therapy with Lupron under the care of Dr. [name] with his last 33-month injection given in August 2019.  He has also been receiving denosumab Delton See) every 6 weeks with his next injection scheduled for 10/2018.   Zytiga was added to his treatment regimen on 08/28/2018.          Due to progressive low back pain, he elected to proceed with surgical decompressive laminectomy on the left at L3 with complete removal of the left L3 lamina, microscopic foraminotomies of the left L3 and L4 nerve roots and microscopic removal of epidural tumor on the left under the care of Dr. [name] on 08/06/2018.  Final surgical pathology confirmed high-grade metastatic prostate adenocarcinoma.  He completed postoperative palliative radiotherapy to the lumbar spine at the levels of L3-L4 on 09/17/18.   He did note improvement in his  back pain following treatment but continued to struggle with weakness and limitations in mobility. Over time, his pain became progressively severe requiring him to present to the emergency department on 10/16/2018 due to intractable pain radiating into the lower extremities with progressive weakness.  He had an MRI of the lumbar spine on admission which showed new severe L3 pathologic fracture, new L1 osseous metastasis with  worsening retroperitoneal nodal metastasis and epidural tumor with retropulsed bony fragments at L3 resulting in moderate L2-3 and L3-4 canal stenosis as well as neural foraminal narrowing at L1 to through L4-5, moderate on the left at L3-4 due to tumor.  He was started on IV dexamethasone with significant improvement in his symptoms.    He was not felt to be a good candidate for further surgical intervention and therefore elected to proceed with an additional course of palliative SRS to the progressive/new sites of disease in the lumbar spine which was completed on 10/24/2018. He tolerated the treatment well and reported significant improvement in his pain following treatment.  INTERVAL HISTORY: He presents today to review his most recent posttreatment MRI of the lumbar spine from 01/25/2019 which shows decreased ventral epidural tumor and overall decreased spinal stenosis.  There was mild enlargement of the L1 metastasis which was reviewed at our recent multidisciplinary brain and spine conference and felt most likely treatment related.  There was also noted increased iliac chain lymphadenopathy.  He has continued taking Zytiga 1000 mg daily since September 2019.  He also continues to receive Lupron every 6 months for ADT under the care and direction of Dr. [name], his urologist.  His last Lupron injection was in August 2019.  His PSA was noted to be 26.7 in October 2019, decreased to 24.2 in November 2019 and as of his most recent PSA reading, has slightly increased to 27.6 on 12/25/2018.  He had a recent follow-up visit with his medical oncologist, Dr. [name] on 12/25/2018 and the recommendation was to continue on Zytiga and Lupron.  His next Lupron injection is due this month (Feb. 2020) but he is unsure if he has any scheduled follow-up visit with Dr. [name] at this time.  Overall, he continues to feel well and is currently without complaints.    On review of systems, the patient states that he is doing very well  overall.  He has noted significant improvement in his lower back pain and is now able to ambulate relatively pain-free with the use of a cane.  He will get some pain in the lower back if stands for prolonged periods of time, but overall, is quite pleased with his progress to date. He denies any numbness or tingling into the lower extremities and denies any radicular pain.  His wife adds that he is able to be much more active at this point with minimal if any discomfort.  He denies any significant fatigue/malaise.  He continues with some mild edema in the bilateral lower extremities that is alleviated with elevation of his LEs.  He denies any chest pain, increased shortness of breath, fever, chills or night sweats.  He continues to take the Zytiga daily which he tolerates well.  His last Lupron injection was given in 07/2018.  ALLERGIES:  is allergic to statins.  Meds: Current Outpatient Medications  Medication Sig Dispense Refill  . Calcium Carb-Cholecalciferol (CALCIUM 1000 + D PO) Take 30 mLs by mouth daily. Calcium 1000 mg / Vit D 1000 iu    . cycloSPORINE (RESTASIS) 0.05 % ophthalmic  emulsion Place 1 drop into both eyes 2 (two) times daily.    Marland Kitchen docusate sodium (COLACE) 100 MG capsule Take 100 mg by mouth 2 (two) times daily.    Marland Kitchen ezetimibe (ZETIA) 10 MG tablet Take 10 mg by mouth every morning.     Marland Kitchen ibuprofen (ADVIL,MOTRIN) 200 MG tablet Take 200 mg by mouth every 6 (six) hours as needed for moderate pain.    Marland Kitchen leuprolide (LUPRON) 30 MG injection Inject 45 mg into the muscle every 6 (six) months.    Marland Kitchen oxyCODONE (OXY IR/ROXICODONE) 5 MG immediate release tablet Take 1-2 tablets (5-10 mg total) by mouth every 4 (four) hours as needed for severe pain. 90 tablet 0  . pantoprazole (PROTONIX) 40 MG tablet Take 40 mg by mouth daily after supper.     . polyethylene glycol (MIRALAX / GLYCOLAX) packet Take 17 g by mouth daily as needed for mild constipation. 14 each 0  . predniSONE (DELTASONE) 5 MG tablet  TAKE 1 TABLET BY MOUTH 2 TIMES DAILY WITH A MEAL. 60 tablet 2  . tamsulosin (FLOMAX) 0.4 MG CAPS capsule Take 0.4 mg by mouth daily.     . traMADol (ULTRAM) 50 MG tablet TAKE 1 TABLET BY MOUTH EVERY 6 HOURS AS NEEDED FOR SEVERE PAIN 60 tablet 0  . triamcinolone cream (KENALOG) 0.1 % Apply 1 application topically daily as needed (dry skin in the winter).     Marland Kitchen ZYTIGA 250 MG tablet TAKE 4 TABLETS (1,000 MG TOTAL) BY MOUTH DAILY. TAKE ON AN EMPTY STOMACH 1 HOUR BEFORE OR 2 HOURS AFTER A MEAL 120 tablet 0  . ondansetron (ZOFRAN) 4 MG tablet Take 4 mg by mouth every 8 (eight) hours as needed for nausea or vomiting.     No current facility-administered medications for this encounter.     Physical Findings:  vitals were not taken for this visit.  Pain Assessment Pain Score: 4  Pain Loc: Back(Lower back pain only if he stands for a while)/10 In general this is a well appearing Caucasian male in no acute distress.  He's alert and oriented x4 and appropriate throughout the examination. Cardiopulmonary assessment is negative for acute distress and he exhibits normal effort.  There is 2+ pittting edema bilaterally in the LEs but no cyanosis, deep calf pain or erythema.  His sensation is intact to light touch bilaterally in the lower extremities and strength is 5 out of 5 and equal bilaterally.  He appears grossly neurologically intact.  Lab Findings: Lab Results  Component Value Date   WBC 6.3 12/25/2018   HGB 12.3 (L) 12/25/2018   HCT 37.9 (L) 12/25/2018   MCV 97.9 12/25/2018   PLT 276 12/25/2018     Radiographic Findings: Mr Lumbar Spine W Wo Contrast  Result Date: 01/25/2019 CLINICAL DATA:  Follow-up treated spine metastases. Prostate cancer. EXAM: MRI LUMBAR SPINE WITHOUT AND WITH CONTRAST TECHNIQUE: Multiplanar and multiecho pulse sequences of the lumbar spine were obtained without and with intravenous contrast. CONTRAST:  9 mL Gadavist COMPARISON:  10/16/2018 FINDINGS: Segmentation:   Standard. Alignment: Lumbar spine straightening. Trace anterolisthesis of L3 on L4. Vertebrae: Diffusely T1 hyperintense marrow signal in the included spine and upper sacrum is consistent with radiation therapy. A 2.6 cm enhancing lesion in the posterior L1 vertebral body and right pedicle has mildly enlarged. A pathologic L3 burst fracture is again seen, now with complete height loss centrally and diffusely abnormal bone marrow signal including T1 hypointensity, STIR hyperintensity, and heterogeneous enhancement throughout the  vertebral body with involvement of the posterior elements bilaterally. L3 enhancement is less confluent and intense than on the prior study. L3 vertebral body retropulsion measures 8 mm, mildly increased though with decreased epidural tumor resulting in overall less spinal stenosis. A chronic L5 compression fracture is unchanged. There is new mild edema and enhancement along the L2 inferior endplate on the left without a discrete fracture or interval vertebral body height loss evident, likely degenerative/reactive. Conus medullaris and cauda equina: Conus extends to the L1-2 level. Conus and cauda equina appear normal. The fluid collection in the dorsal thecal sac on the prior study (possibly subdural) has resolved. Paraspinal and other soft tissues: Partially visualized left larger than right renal cysts and partially visualized left adrenal mass, also present on an 07/23/2018 abdominal CT. Distal aortocaval and left periaortic lymph nodes have mildly decreased in size. Iliac chain lymph nodes have enlarged measuring up to 2.0 cm in short axis near the right common iliac bifurcation and 1.5 cm in the right external iliac chain. Persistent bilateral iliopsoas muscle edema. Disc levels: Disc desiccation throughout the lumbar spine. Mild disc space narrowing at L1-2. T12-L1: Mild facet arthrosis without disc herniation or stenosis. L1-2: Mild disc bulging and a small left extraforaminal disc  protrusion result in minimal left neural foraminal narrowing without spinal stenosis, unchanged. L2-3: Prior left laminectomy. Retropulsion and decreased ventral epidural tumor result in mild spinal and right lateral recess stenosis, improved from prior. There is mild right and mild-to-moderate left neural foraminal stenosis. L3-4: Circumferential disc bulging, minimal ventral epidural tumor, and severe facet hypertrophy result in mild spinal stenosis, mild-to-moderate bilateral lateral recess stenosis, and mild left greater than right neural foraminal stenosis. Spinal stenosis has mildly improved. L4-5: Disc bulging and moderate facet and ligamentum flavum hypertrophy result in mild spinal stenosis, mild bilateral lateral recess stenosis, and mild bilateral neural foraminal stenosis, unchanged. L5-S1: Mild-to-moderate facet arthrosis without disc herniation or stenosis, unchanged. IMPRESSION: 1. L3 burst fracture with progressive, complete height loss centrally. Mildly increased retropulsion but decreased ventral epidural tumor with overall decreased spinal stenosis. 2. Mild enlargement of L1 metastasis. 3. Resolution of subdural fluid collection. 4. Worsening iliac chain lymphadenopathy. Electronically Signed   By: Logan Bores M.D.   On: 01/25/2019 14:00    Impression/Plan: 1. 78 yo man with recurrent spinal metastasis at previously treated L3 and new disease at L1. He appears to have recovered well from the effects of his recent [facility] treatment.  He continues to take Zytiga daily with prednisone 5 mg, under the care and direction of Dr. [name].  He has a scheduled follow-up visit with Dr. [name] on 02/19/2019 and will remain on ADT indefinitely under the care of Dr. [name]- due for his next injection in 01/2019.  He will also continue on Xgeva every 4 to 6 weeks at the direction of Dr. [name].  We will continue to monitor the patient closely for any disease progression or recurrence in the spine with an MRI of  the lumbar spine every 3 months.  His case will be presented at the multidisciplinary brain and spine conference prior to a follow-up office visit to review results and recommendations.  He and his wife are comfortable and in agreement with the stated plan.  They know to call with any questions or concerns in the interim.        Nicholos Johns, PA-C

## 2019-02-01 DIAGNOSIS — R590 Localized enlarged lymph nodes: Secondary | ICD-10-CM | POA: Diagnosis not present

## 2019-02-01 DIAGNOSIS — Z8042 Family history of malignant neoplasm of prostate: Secondary | ICD-10-CM | POA: Diagnosis not present

## 2019-02-01 DIAGNOSIS — C7951 Secondary malignant neoplasm of bone: Secondary | ICD-10-CM | POA: Diagnosis not present

## 2019-02-01 DIAGNOSIS — R972 Elevated prostate specific antigen [PSA]: Secondary | ICD-10-CM | POA: Diagnosis not present

## 2019-02-01 DIAGNOSIS — C61 Malignant neoplasm of prostate: Secondary | ICD-10-CM | POA: Diagnosis not present

## 2019-02-07 ENCOUNTER — Other Ambulatory Visit: Payer: Self-pay | Admitting: Oncology

## 2019-02-07 DIAGNOSIS — C61 Malignant neoplasm of prostate: Secondary | ICD-10-CM

## 2019-02-18 MED FILL — ZYTIGA 250 MG TABLET: 250 | 30 days supply | Qty: 120 | Fill #0

## 2019-02-19 ENCOUNTER — Inpatient Hospital Stay (HOSPITAL_BASED_OUTPATIENT_CLINIC_OR_DEPARTMENT_OTHER): Payer: Medicare Other | Admitting: Oncology

## 2019-02-19 ENCOUNTER — Inpatient Hospital Stay: Payer: Medicare Other | Attending: Oncology

## 2019-02-19 ENCOUNTER — Inpatient Hospital Stay: Payer: Medicare Other

## 2019-02-19 ENCOUNTER — Telehealth: Payer: Self-pay | Admitting: Oncology

## 2019-02-19 VITALS — BP 139/85 | HR 98 | Temp 98.4°F | Resp 18 | Ht 71.0 in | Wt 192.4 lb

## 2019-02-19 DIAGNOSIS — C61 Malignant neoplasm of prostate: Secondary | ICD-10-CM

## 2019-02-19 DIAGNOSIS — Z9221 Personal history of antineoplastic chemotherapy: Secondary | ICD-10-CM | POA: Insufficient documentation

## 2019-02-19 DIAGNOSIS — C7951 Secondary malignant neoplasm of bone: Secondary | ICD-10-CM

## 2019-02-19 DIAGNOSIS — C7970 Secondary malignant neoplasm of unspecified adrenal gland: Secondary | ICD-10-CM

## 2019-02-19 DIAGNOSIS — G893 Neoplasm related pain (acute) (chronic): Secondary | ICD-10-CM

## 2019-02-19 DIAGNOSIS — Z923 Personal history of irradiation: Secondary | ICD-10-CM

## 2019-02-19 DIAGNOSIS — Z79899 Other long term (current) drug therapy: Secondary | ICD-10-CM | POA: Insufficient documentation

## 2019-02-19 LAB — CBC WITH DIFFERENTIAL (CANCER CENTER ONLY)
Abs Immature Granulocytes: 0.04 10*3/uL (ref 0.00–0.07)
BASOS ABS: 0 10*3/uL (ref 0.0–0.1)
Basophils Relative: 1 %
Eosinophils Absolute: 0.1 10*3/uL (ref 0.0–0.5)
Eosinophils Relative: 2 %
HCT: 37.4 % — ABNORMAL LOW (ref 39.0–52.0)
Hemoglobin: 12.3 g/dL — ABNORMAL LOW (ref 13.0–17.0)
IMMATURE GRANULOCYTES: 1 %
Lymphocytes Relative: 15 %
Lymphs Abs: 0.9 10*3/uL (ref 0.7–4.0)
MCH: 30.8 pg (ref 26.0–34.0)
MCHC: 32.9 g/dL (ref 30.0–36.0)
MCV: 93.5 fL (ref 80.0–100.0)
Monocytes Absolute: 0.5 10*3/uL (ref 0.1–1.0)
Monocytes Relative: 9 %
NRBC: 0 % (ref 0.0–0.2)
Neutro Abs: 4.2 10*3/uL (ref 1.7–7.7)
Neutrophils Relative %: 72 %
Platelet Count: 300 10*3/uL (ref 150–400)
RBC: 4 MIL/uL — ABNORMAL LOW (ref 4.22–5.81)
RDW: 13.2 % (ref 11.5–15.5)
WBC: 5.8 10*3/uL (ref 4.0–10.5)

## 2019-02-19 LAB — CMP (CANCER CENTER ONLY)
ALT: 8 U/L (ref 0–44)
AST: 12 U/L — AB (ref 15–41)
Albumin: 3.1 g/dL — ABNORMAL LOW (ref 3.5–5.0)
Alkaline Phosphatase: 105 U/L (ref 38–126)
Anion gap: 10 (ref 5–15)
BUN: 14 mg/dL (ref 8–23)
CO2: 21 mmol/L — ABNORMAL LOW (ref 22–32)
Calcium: 8.8 mg/dL — ABNORMAL LOW (ref 8.9–10.3)
Chloride: 108 mmol/L (ref 98–111)
Creatinine: 0.75 mg/dL (ref 0.61–1.24)
GFR, Est AFR Am: >60 mL/min (ref >60)
GFR, Estimated: >60 mL/min (ref >60)
GLUCOSE: 117 mg/dL — AB (ref 70–99)
Potassium: 3.8 mmol/L (ref 3.5–5.1)
Sodium: 139 mmol/L (ref 135–145)
Total Bilirubin: 0.4 mg/dL (ref 0.3–1.2)
Total Protein: 6.1 g/dL — ABNORMAL LOW (ref 6.5–8.1)

## 2019-02-19 MED ORDER — DENOSUMAB 120 MG/1.7ML ~~LOC~~ SOLN
SUBCUTANEOUS | Status: AC
  Filled 2019-02-19: qty 1.7

## 2019-02-19 MED ORDER — DENOSUMAB 120 MG/1.7ML ~~LOC~~ SOLN
120.0000 mg | Freq: Once | SUBCUTANEOUS | Status: AC
  Administered 2019-02-19: 120 mg via SUBCUTANEOUS

## 2019-02-19 NOTE — Patient Instructions (Signed)

## 2019-02-19 NOTE — Progress Notes (Signed)
Hematology and Oncology Follow Up Visit  [name] [id number] 1941-12-08 78 y.o. 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  He initially presented with Gleason score of 5+4 = 9 and a PSA of 13.56 in 2017.  Prior Therapy: He was treated there with external beam radiation and brachii therapy in January 2017 and completed 2 years of androgen deprivation.  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  Metastatic work-up at that time showed bone involvement as well as lymphadenopathy.    He was treated under the care of Dr. [name] at [facility] initially with enzalutamide started in May 2018 until January 2019 where he presented with progression of disease with bony metastasis, adrenal nodule as well as worsening adenopathy.    Taxotere chemotherapy initially at 60 mg/m and that was reduced to 45 mg/m after cycle 6.  His last treatment was in July 2019 given at [facility].  Repeat imaging studies showed his lymphadenopathy has improved but he did develop worsening bony metastasis based on a lumbar spine obtained on June 24, 2018.  The MRI showed a complete replacement of L3 vertebral body with tumor the tumor in the ventral epidural space behind L3 greater to the left.  Bulky retroperitoneal adenopathy was slightly improved as mentioned.    He is status post decompressive laminectomy and removal of L3 tumor completed on 08/06/2018.  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  Current therapy:  Zytiga 1000 mg daily started in September 2019.  Androgen deprivation under the care of Dr. [name] last injection given in August 2019.  Xgeva 120 mg every 2 months.  His next  injection scheduled for November 2019.  Interim History: [name] is here for a follow-up visit.  Since the last visit, he reports no major changes in his health.  He continues to ambulate with the help of a cane without any major difficulties.  He does report some back discomfort associated with increased activity.  He does take tramadol and ibuprofen which has been able to control his pain.  Very infrequently he takes oxycodone.  He denies any recent hospitalizations or illnesses.  He continues to tolerate Zytiga without any recent issues.  He denies any worsening edema, fatigue or nausea.  His quality of life remains unchanged.  Patient denied any alteration mental status, neuropathy, confusion or dizziness.  Denies any headaches or lethargy.  Denies any night sweats, weight loss or changes in appetite.  Denied orthopnea, dyspnea on exertion or chest discomfort.  Denies shortness of breath, difficulty breathing hemoptysis or cough.  Denies any abdominal distention, nausea, early satiety or dyspepsia.  Denies any hematuria, frequency, dysuria or nocturia.  Denies any skin irritation, dryness or rash.  Denies any ecchymosis or petechiae.  Denies any lymphadenopathy or clotting.  Denies any heat or cold intolerance.  Denies any anxiety or depression.  Remaining review of system is negative.      Medications: I have reviewed the patient's current medications.  Current Outpatient Medications  Medication Sig Dispense Refill  . Calcium Carb-Cholecalciferol (CALCIUM 1000 + D PO) Take 30 mLs by mouth daily. Calcium 1000 mg / Vit D 1000 iu    . cycloSPORINE (RESTASIS) 0.05 % ophthalmic emulsion Place 1  drop into both eyes 2 (two) times daily.    Marland Kitchen docusate sodium (COLACE) 100 MG capsule Take 100 mg by mouth 2 (two) times daily.    Marland Kitchen ezetimibe (ZETIA) 10 MG tablet Take 10 mg by mouth every morning.     Marland Kitchen ibuprofen (ADVIL,MOTRIN) 200 MG tablet Take 200 mg by mouth every 6 (six) hours as needed for  moderate pain.    Marland Kitchen leuprolide (LUPRON) 30 MG injection Inject 45 mg into the muscle every 6 (six) months.    . ondansetron (ZOFRAN) 4 MG tablet Take 4 mg by mouth every 8 (eight) hours as needed for nausea or vomiting.    Marland Kitchen oxyCODONE (OXY IR/ROXICODONE) 5 MG immediate release tablet Take 1-2 tablets (5-10 mg total) by mouth every 4 (four) hours as needed for severe pain. 90 tablet 0  . pantoprazole (PROTONIX) 40 MG tablet Take 40 mg by mouth daily after supper.     . polyethylene glycol (MIRALAX / GLYCOLAX) packet Take 17 g by mouth daily as needed for mild constipation. 14 each 0  . predniSONE (DELTASONE) 5 MG tablet TAKE 1 TABLET BY MOUTH 2 TIMES DAILY WITH A MEAL. 60 tablet 2  . tamsulosin (FLOMAX) 0.4 MG CAPS capsule Take 0.4 mg by mouth daily.     . traMADol (ULTRAM) 50 MG tablet TAKE 1 TABLET BY MOUTH EVERY 6 HOURS AS NEEDED FOR SEVERE PAIN 60 tablet 0  . triamcinolone cream (KENALOG) 0.1 % Apply 1 application topically daily as needed (dry skin in the winter).     Marland Kitchen ZYTIGA 250 MG tablet TAKE 4 TABLETS (1,000 MG TOTAL) BY MOUTH DAILY. TAKE ON AN EMPTY STOMACH 1 HOUR BEFORE OR 2 HOURS AFTER A MEAL 120 tablet 0   No current facility-administered medications for this visit.      Allergies:  Allergies  Allergen Reactions  . Statins Other (See Comments)    Muscle pain    Past Medical History, Surgical history, Social history, and Family History were reviewed and updated.    Physical Exam:  Blood pressure 139/85, pulse 98, temperature 98.4 F (36.9 C), temperature source Oral, resp. rate 18, height 5\' 11"  (1.803 m), weight 192 lb 6.4 oz (87.3 kg), SpO2 99 %.     ECOG: 1   General appearance: Alert, awake without any distress. Head: Atraumatic without abnormalities Oropharynx: Without any thrush or ulcers. Eyes: No scleral icterus. Lymph nodes: No lymphadenopathy noted in the cervical, supraclavicular, or axillary nodes Heart:regular rate and rhythm, without any murmurs or  gallops.   Lung: Clear to auscultation without any rhonchi, wheezes or dullness to percussion. Abdomin: Soft, nontender without any shifting dullness or ascites. Musculoskeletal: No clubbing or cyanosis. Neurological: No motor or sensory deficits. Skin: No rashes or lesions.         Lab Results: Lab Results  Component Value Date   WBC 6.3 12/25/2018   HGB 12.3 (L) 12/25/2018   HCT 37.9 (L) 12/25/2018   MCV 97.9 12/25/2018   PLT 276 12/25/2018     Chemistry      Component Value Date/Time   NA 139 12/25/2018 1112   K 4.1 12/25/2018 1112   CL 107 12/25/2018 1112   CO2 25 12/25/2018 1112   BUN 9 12/25/2018 1112   CREATININE 0.75 12/25/2018 1112      Component Value Date/Time   CALCIUM 8.9 12/25/2018 1112   ALKPHOS 96 12/25/2018 1112   AST 16 12/25/2018 1112   ALT 15 12/25/2018 1112   BILITOT  0.6 12/25/2018 1112        Results for [name], [name] (MRN [id number]) as of 02/19/2019 09:38  Ref. Range 11/09/2018 13:00 12/25/2018 11:12  Prostate Specific Ag, Serum Latest Ref Range: 0.0 - 4.0 ng/mL 24.2 (H) 27.6 (H)   IMPRESSION: 1. L3 burst fracture with progressive, complete height loss centrally. Mildly increased retropulsion but decreased ventral epidural tumor with overall decreased spinal stenosis. 2. Mild enlargement of L1 metastasis. 3. Resolution of subdural fluid collection. 4. Worsening iliac chain lymphadenopathy. Impression and Plan:  78 year old man with the following:  1.  Castration-resistant prostate cancer with disease to the bone and lymphadenopathy diagnosed in 2018.  He remains on Zytiga with stable PSA response without any major complications.  The natural course of his disease was discussed today and future treatment options were reiterated.  His PSA starts to rise rapidly, systemic chemotherapy may be needed in the form of [name].  Trudi Ida could be a possibility if he develops painful bony metastasis.  Risks and benefits of continuing therapy  was discussed today and is agreeable to continue.  We will restage him with repeat imaging studies before the next visit.    2.  Spinal metastasis: Radiation therapy completed.  MRI of the spine on January 25, 2019 showed reduction of his epidural tumor.  He remains on active surveillance at this time with a repeat MRI in 3 months.  3.  Bone directed therapy: He continues to receive Xgeva every 2 months without any major complications.  Risks and benefits of continuing this therapy and long-term complications were reiterated.  These would include osteonecrosis of the jaw and hypocalcemia.  He is agreeable to continue.  4.  Androgen deprivation: He continues to receive that under the care of Dr. [name].  I recommended continuing this indefinitely.  5.  Pain: Manageable at this time with Ultram and ibuprofen.  6.  Prognosis and goals of care: Therapy remains palliative and his disease remains incurable.  His performance status remains adequate and aggressive therapy is warranted.  7.  Follow-up: We will be in 2 months for repeat evaluation.  25  minutes was spent with the patient face-to-face today.  More than 50% of time was dedicated reviewing his imaging studies, laboratory data, updating his disease status and answer question regarding future plan of care.    Zola Button, MD 3/3/20209:41 AM

## 2019-02-19 NOTE — Telephone Encounter (Signed)
Gave avs and calendar ° °

## 2019-02-20 ENCOUNTER — Telehealth: Payer: Self-pay

## 2019-02-20 LAB — PROSTATE-SPECIFIC AG, SERUM (LABCORP): Prostate Specific Ag, Serum: 51 ng/mL — ABNORMAL HIGH (ref 0.0–4.0)

## 2019-02-20 NOTE — Telephone Encounter (Signed)
Contacted patient and made aware of PSA results. 

## 2019-02-20 NOTE — Telephone Encounter (Signed)
-----   Message from [name], MD sent at 02/20/2019  8:09 AM EST ----- Please let him know his PSA is up. No changes for now.

## 2019-02-21 DIAGNOSIS — M5440 Lumbago with sciatica, unspecified side: Secondary | ICD-10-CM | POA: Diagnosis not present

## 2019-02-21 DIAGNOSIS — C7951 Secondary malignant neoplasm of bone: Secondary | ICD-10-CM | POA: Diagnosis not present

## 2019-02-21 DIAGNOSIS — R030 Elevated blood-pressure reading, without diagnosis of hypertension: Secondary | ICD-10-CM | POA: Diagnosis not present

## 2019-02-21 DIAGNOSIS — C61 Malignant neoplasm of prostate: Secondary | ICD-10-CM | POA: Diagnosis not present

## 2019-02-21 DIAGNOSIS — Z6826 Body mass index (BMI) 26.0-26.9, adult: Secondary | ICD-10-CM | POA: Diagnosis not present

## 2019-02-28 ENCOUNTER — Other Ambulatory Visit: Payer: Self-pay | Admitting: Oncology

## 2019-03-04 MED FILL — TAMSULOSIN HCL 0.4 MG CAP: 0.4 | 90 days supply | Qty: 90 | Fill #3

## 2019-03-08 ENCOUNTER — Other Ambulatory Visit: Payer: Self-pay | Admitting: Oncology

## 2019-03-08 DIAGNOSIS — C61 Malignant neoplasm of prostate: Secondary | ICD-10-CM

## 2019-03-18 ENCOUNTER — Other Ambulatory Visit: Payer: Self-pay | Admitting: Radiation Therapy

## 2019-03-18 DIAGNOSIS — C7951 Secondary malignant neoplasm of bone: Secondary | ICD-10-CM

## 2019-03-18 MED FILL — ZYTIGA 250 MG TABLET: 250 | 30 days supply | Qty: 120 | Fill #0

## 2019-03-20 DIAGNOSIS — E1122 Type 2 diabetes mellitus with diabetic chronic kidney disease: Secondary | ICD-10-CM | POA: Diagnosis not present

## 2019-03-20 DIAGNOSIS — I129 Hypertensive chronic kidney disease with stage 1 through stage 4 chronic kidney disease, or unspecified chronic kidney disease: Secondary | ICD-10-CM | POA: Diagnosis not present

## 2019-03-20 DIAGNOSIS — I2510 Atherosclerotic heart disease of native coronary artery without angina pectoris: Secondary | ICD-10-CM | POA: Diagnosis not present

## 2019-03-20 DIAGNOSIS — Z48812 Encounter for surgical aftercare following surgery on the circulatory system: Secondary | ICD-10-CM | POA: Diagnosis not present

## 2019-03-29 ENCOUNTER — Other Ambulatory Visit: Payer: Self-pay | Admitting: Oncology

## 2019-04-06 ENCOUNTER — Other Ambulatory Visit: Payer: Self-pay | Admitting: Oncology

## 2019-04-06 DIAGNOSIS — C61 Malignant neoplasm of prostate: Secondary | ICD-10-CM

## 2019-04-18 MED FILL — ZYTIGA 250 MG TABLET: 250 | 30 days supply | Qty: 120 | Fill #0

## 2019-04-21 ENCOUNTER — Other Ambulatory Visit: Payer: Self-pay | Admitting: Oncology

## 2019-04-22 ENCOUNTER — Other Ambulatory Visit: Payer: Self-pay | Admitting: Radiation Therapy

## 2019-04-23 ENCOUNTER — Other Ambulatory Visit: Payer: Self-pay

## 2019-04-23 ENCOUNTER — Ambulatory Visit (HOSPITAL_COMMUNITY): Payer: Medicare Other

## 2019-04-23 ENCOUNTER — Ambulatory Visit (HOSPITAL_COMMUNITY)
Admission: RE | Admit: 2019-04-23 | Discharge: 2019-04-23 | Disposition: A | Discharge status: Home / Self Care | Payer: Medicare Other | Source: Ambulatory Visit | Attending: Oncology | Admitting: Oncology

## 2019-04-23 ENCOUNTER — Inpatient Hospital Stay: Payer: Medicare Other | Attending: Oncology

## 2019-04-23 ENCOUNTER — Ambulatory Visit (HOSPITAL_COMMUNITY): Admission: RE | Admit: 2019-04-23 | Payer: Medicare Other | Source: Ambulatory Visit

## 2019-04-23 DIAGNOSIS — C61 Malignant neoplasm of prostate: Secondary | ICD-10-CM | POA: Insufficient documentation

## 2019-04-23 DIAGNOSIS — Z9221 Personal history of antineoplastic chemotherapy: Secondary | ICD-10-CM | POA: Insufficient documentation

## 2019-04-23 DIAGNOSIS — Z7689 Persons encountering health services in other specified circumstances: Secondary | ICD-10-CM | POA: Diagnosis not present

## 2019-04-23 DIAGNOSIS — Z79899 Other long term (current) drug therapy: Secondary | ICD-10-CM | POA: Diagnosis not present

## 2019-04-23 DIAGNOSIS — Z79818 Long term (current) use of other agents affecting estrogen receptors and estrogen levels: Secondary | ICD-10-CM | POA: Insufficient documentation

## 2019-04-23 DIAGNOSIS — C7951 Secondary malignant neoplasm of bone: Secondary | ICD-10-CM | POA: Diagnosis not present

## 2019-04-23 DIAGNOSIS — Z7952 Long term (current) use of systemic steroids: Secondary | ICD-10-CM | POA: Insufficient documentation

## 2019-04-23 DIAGNOSIS — Z923 Personal history of irradiation: Secondary | ICD-10-CM | POA: Insufficient documentation

## 2019-04-23 DIAGNOSIS — Z791 Long term (current) use of non-steroidal anti-inflammatories (NSAID): Secondary | ICD-10-CM | POA: Insufficient documentation

## 2019-04-23 DIAGNOSIS — R590 Localized enlarged lymph nodes: Secondary | ICD-10-CM | POA: Insufficient documentation

## 2019-04-23 DIAGNOSIS — Z5111 Encounter for antineoplastic chemotherapy: Secondary | ICD-10-CM | POA: Diagnosis not present

## 2019-04-23 DIAGNOSIS — M549 Dorsalgia, unspecified: Secondary | ICD-10-CM | POA: Diagnosis not present

## 2019-04-23 DIAGNOSIS — C7970 Secondary malignant neoplasm of unspecified adrenal gland: Secondary | ICD-10-CM | POA: Insufficient documentation

## 2019-04-23 DIAGNOSIS — R918 Other nonspecific abnormal finding of lung field: Secondary | ICD-10-CM | POA: Diagnosis not present

## 2019-04-23 LAB — CMP (CANCER CENTER ONLY)
ALT: 8 U/L (ref 0–44)
AST: 16 U/L (ref 15–41)
Albumin: 3 g/dL — ABNORMAL LOW (ref 3.5–5.0)
Alkaline Phosphatase: 114 U/L (ref 38–126)
Anion gap: 8 (ref 5–15)
BUN: 10 mg/dL (ref 8–23)
CO2: 23 mmol/L (ref 22–32)
Calcium: 8.5 mg/dL — ABNORMAL LOW (ref 8.9–10.3)
Chloride: 107 mmol/L (ref 98–111)
Creatinine: 0.73 mg/dL (ref 0.61–1.24)
GFR, Est AFR Am: >60 mL/min (ref >60)
GFR, Estimated: >60 mL/min (ref >60)
Glucose, Bld: 92 mg/dL (ref 70–99)
Potassium: 4.1 mmol/L (ref 3.5–5.1)
Sodium: 138 mmol/L (ref 135–145)
Total Bilirubin: 0.4 mg/dL (ref 0.3–1.2)
Total Protein: 6.3 g/dL — ABNORMAL LOW (ref 6.5–8.1)

## 2019-04-23 LAB — CBC WITH DIFFERENTIAL (CANCER CENTER ONLY)
Abs Immature Granulocytes: 0.03 10*3/uL (ref 0.00–0.07)
Basophils Absolute: 0 10*3/uL (ref 0.0–0.1)
Basophils Relative: 1 %
Eosinophils Absolute: 0.1 10*3/uL (ref 0.0–0.5)
Eosinophils Relative: 2 %
HCT: 37.1 % — ABNORMAL LOW (ref 39.0–52.0)
Hemoglobin: 11.9 g/dL — ABNORMAL LOW (ref 13.0–17.0)
Immature Granulocytes: 1 %
Lymphocytes Relative: 11 %
Lymphs Abs: 0.6 10*3/uL — ABNORMAL LOW (ref 0.7–4.0)
MCH: 29.8 pg (ref 26.0–34.0)
MCHC: 32.1 g/dL (ref 30.0–36.0)
MCV: 92.8 fL (ref 80.0–100.0)
Monocytes Absolute: 0.6 10*3/uL (ref 0.1–1.0)
Monocytes Relative: 11 %
Neutro Abs: 4.2 10*3/uL (ref 1.7–7.7)
Neutrophils Relative %: 74 %
Platelet Count: 316 10*3/uL (ref 150–400)
RBC: 4 MIL/uL — ABNORMAL LOW (ref 4.22–5.81)
RDW: 14.9 % (ref 11.5–15.5)
WBC Count: 5.5 10*3/uL (ref 4.0–10.5)
nRBC: 0 % (ref 0.0–0.2)

## 2019-04-23 MED ORDER — SODIUM CHLORIDE (PF) 0.9 % IJ SOLN
INTRAMUSCULAR | Status: AC
  Filled 2019-04-23: qty 50

## 2019-04-23 MED ORDER — IOHEXOL 300 MG/ML  SOLN
100.0000 mL | Freq: Once | INTRAMUSCULAR | Status: AC | PRN
  Administered 2019-04-23: 100 mL via INTRAVENOUS

## 2019-04-24 LAB — PROSTATE-SPECIFIC AG, SERUM (LABCORP): Prostate Specific Ag, Serum: 115 ng/mL — ABNORMAL HIGH (ref 0.0–4.0)

## 2019-04-25 ENCOUNTER — Inpatient Hospital Stay (HOSPITAL_BASED_OUTPATIENT_CLINIC_OR_DEPARTMENT_OTHER): Payer: Medicare Other | Admitting: Oncology

## 2019-04-25 ENCOUNTER — Other Ambulatory Visit: Payer: Self-pay

## 2019-04-25 ENCOUNTER — Telehealth: Payer: Self-pay | Admitting: *Deleted

## 2019-04-25 VITALS — BP 135/75 | HR 82 | Temp 98.0°F | Resp 18 | Ht 71.0 in | Wt 190.9 lb

## 2019-04-25 DIAGNOSIS — Z79899 Other long term (current) drug therapy: Secondary | ICD-10-CM

## 2019-04-25 DIAGNOSIS — Z923 Personal history of irradiation: Secondary | ICD-10-CM

## 2019-04-25 DIAGNOSIS — Z79818 Long term (current) use of other agents affecting estrogen receptors and estrogen levels: Secondary | ICD-10-CM

## 2019-04-25 DIAGNOSIS — Z9221 Personal history of antineoplastic chemotherapy: Secondary | ICD-10-CM

## 2019-04-25 DIAGNOSIS — Z791 Long term (current) use of non-steroidal anti-inflammatories (NSAID): Secondary | ICD-10-CM

## 2019-04-25 DIAGNOSIS — C61 Malignant neoplasm of prostate: Secondary | ICD-10-CM | POA: Diagnosis not present

## 2019-04-25 DIAGNOSIS — C7970 Secondary malignant neoplasm of unspecified adrenal gland: Secondary | ICD-10-CM | POA: Diagnosis not present

## 2019-04-25 DIAGNOSIS — Z7189 Other specified counseling: Secondary | ICD-10-CM | POA: Insufficient documentation

## 2019-04-25 DIAGNOSIS — C7951 Secondary malignant neoplasm of bone: Secondary | ICD-10-CM

## 2019-04-25 DIAGNOSIS — R590 Localized enlarged lymph nodes: Secondary | ICD-10-CM | POA: Diagnosis not present

## 2019-04-25 DIAGNOSIS — Z5111 Encounter for antineoplastic chemotherapy: Secondary | ICD-10-CM | POA: Diagnosis not present

## 2019-04-25 DIAGNOSIS — M549 Dorsalgia, unspecified: Secondary | ICD-10-CM | POA: Diagnosis not present

## 2019-04-25 DIAGNOSIS — Z7952 Long term (current) use of systemic steroids: Secondary | ICD-10-CM

## 2019-04-25 DIAGNOSIS — Z7689 Persons encountering health services in other specified circumstances: Secondary | ICD-10-CM | POA: Diagnosis not present

## 2019-04-25 MED ORDER — PROCHLORPERAZINE MALEATE 10 MG PO TABS: 10.0000 mg | ORAL_TABLET | Freq: Four times a day (QID) | ORAL | 0 refills | Status: AC | PRN

## 2019-04-25 MED ORDER — OXYCODONE HCL 5 MG PO TABS: 5.0000 mg | ORAL_TABLET | ORAL | 0 refills | Status: DC | PRN

## 2019-04-25 MED ORDER — LIDOCAINE-PRILOCAINE 2.5-2.5 % EX CREA: 1.0000 "application " | TOPICAL_CREAM | CUTANEOUS | 0 refills | Status: AC | PRN

## 2019-04-25 NOTE — Progress Notes (Signed)
START ON PATHWAY REGIMEN - Prostate     A cycle is every 21 days.:     Cabazitaxel      Prednisone   **Always confirm dose/schedule in your pharmacy ordering system**  Patient Characteristics: Adenocarcinoma, Distant Metastases, Castration Resistant, Symptomatic, Prior Docetaxel/Docetaxel Ineligible Histology: Adenocarcinoma Therapeutic Status: Distant Metastases  Intent of Therapy: Non-Curative / Palliative Intent, Discussed with Patient

## 2019-04-25 NOTE — Progress Notes (Signed)
Hematology and Oncology Follow Up Visit  [name] [id number] 1941-03-07 78 y.o. 04/25/2019 8:44 AM [name], MDPharr, Thayer Jew, MD   Principle Diagnosis: 78 year old man with advanced prostate cancer with disease to the bone and lymph nodes diagnosed in 2018.  He has castration-resistant after presenting with Gleason score of 5+4 = 9 and a PSA of 13.56 in 2017.  Prior Therapy: He was treated there with external beam radiation and brachii therapy in January 2017 and completed 2 years of androgen deprivation.  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  Metastatic work-up at that time showed bone involvement as well as lymphadenopathy.    He was treated under the care of Dr. [name] at [facility] initially with enzalutamide started in May 2018 until January 2019 where he presented with progression of disease with bony metastasis, adrenal nodule as well as worsening adenopathy.    Taxotere chemotherapy initially at 60 mg/m and that was reduced to 45 mg/m after cycle 6.  His last treatment was in July 2019 given at [facility].  Repeat imaging studies showed his lymphadenopathy has improved but he did develop worsening bony metastasis based on a lumbar spine obtained on June 24, 2018.  The MRI showed a complete replacement of L3 vertebral body with tumor the tumor in the ventral epidural space behind L3 greater to the left.  Bulky retroperitoneal adenopathy was slightly improved as mentioned.    He is status post decompressive laminectomy and removal of L3 tumor completed on 08/06/2018.  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  Current therapy:  Zytiga 1000 mg daily started in September 2019.  Androgen deprivation under the care of Dr. [name] last injection given in August 2019.  Xgeva 120 mg every 2 months.  Last  injection given in March 2020.  Interim History: [name] returns today for a repeat evaluation.  Since the last visit, he reports no major changes in his health.  He continues to have periodic back pain that is manageable with Ultram mostly and infrequently oxycodone.  His ambulation is limited because of it but does not report any weakness or neurological deficits.  Follow status and activity level remain excellent.  He denies any nausea weight loss.  Denies any constitutional symptoms.  He denied headaches, blurry vision, syncope or seizures.  Denies any fevers, chills or sweats.  Denied chest pain, palpitation, orthopnea or leg edema.  Denied cough, wheezing or hemoptysis.  Denied nausea, vomiting or abdominal pain.  Denies any constipation or diarrhea.  Denies any frequency urgency or hesitancy.  Denies any arthralgias or myalgias.  Denies any skin rashes or lesions.  Denies any bleeding or clotting tendency.  Denies any easy bruising.  Denies any hair or nail changes.  Denies any anxiety or depression.  Remaining review of system is negative.         Medications: I have reviewed the patient's current medications.  Current Outpatient Medications  Medication Sig Dispense Refill  . Calcium Carb-Cholecalciferol (CALCIUM 1000 + D PO) Take 30 mLs by mouth daily. Calcium 1000 mg / Vit D 1000 iu    . cycloSPORINE (RESTASIS) 0.05 % ophthalmic emulsion Place 1 drop into both eyes 2 (two) times daily.    Marland Kitchen docusate sodium (COLACE) 100 MG capsule Take 100 mg by mouth 2 (two) times daily.    Marland Kitchen  ezetimibe (ZETIA) 10 MG tablet Take 10 mg by mouth every morning.     Marland Kitchen ibuprofen (ADVIL,MOTRIN) 200 MG tablet Take 200 mg by mouth every 6 (six) hours as needed for moderate pain.    Marland Kitchen leuprolide (LUPRON) 30 MG injection Inject 45 mg into the muscle every 6 (six) months.    . ondansetron (ZOFRAN) 4 MG tablet Take 4 mg by mouth every 8 (eight) hours as needed for nausea or vomiting.    Marland Kitchen oxyCODONE (OXY  IR/ROXICODONE) 5 MG immediate release tablet Take 1-2 tablets (5-10 mg total) by mouth every 4 (four) hours as needed for severe pain. 90 tablet 0  . pantoprazole (PROTONIX) 40 MG tablet Take 40 mg by mouth daily after supper.     . polyethylene glycol (MIRALAX / GLYCOLAX) packet Take 17 g by mouth daily as needed for mild constipation. 14 each 0  . predniSONE (DELTASONE) 5 MG tablet TAKE 1 TABLET BY MOUTH 2 TIMES DAILY WITH A MEAL. 60 tablet 2  . tamsulosin (FLOMAX) 0.4 MG CAPS capsule Take 0.4 mg by mouth daily.     . traMADol (ULTRAM) 50 MG tablet TAKE 1 TABLET BY MOUTH EVERY 6 HOURS AS NEEDED FOR PAIN 60 tablet 0  . triamcinolone cream (KENALOG) 0.1 % Apply 1 application topically daily as needed (dry skin in the winter).     Marland Kitchen ZYTIGA 250 MG tablet TAKE 4 TABLETS (1,000 MG TOTAL) BY MOUTH DAILY. TAKE ON AN EMPTY STOMACH 1 HOUR BEFORE OR 2 HOURS AFTER A MEAL 120 tablet 0   No current facility-administered medications for this visit.      Allergies:  Allergies  Allergen Reactions  . Statins Other (See Comments)    Muscle pain    Past Medical History, Surgical history, Social history, and Family History were reviewed and updated.    Physical Exam:   Blood pressure 135/75, pulse 82, temperature 98 F (36.7 C), temperature source Oral, resp. rate 18, height 5\' 11"  (1.803 m), weight 190 lb 14.4 oz (86.6 kg), SpO2 98 %.     ECOG: 1    General appearance: Comfortable appearing without any discomfort Head: Normocephalic without any trauma Oropharynx: Mucous membranes are moist and pink without any thrush or ulcers. Eyes: Pupils are equal and round reactive to light. Lymph nodes: No cervical, supraclavicular, inguinal or axillary lymphadenopathy.   Heart:regular rate and rhythm.  S1 and S2 without leg edema. Lung: Clear without any rhonchi or wheezes.  No dullness to percussion. Abdomin: Soft, nontender, nondistended with good bowel sounds.  No  hepatosplenomegaly. Musculoskeletal: No joint deformity or effusion.  Full range of motion noted. Neurological: No deficits noted on motor, sensory and deep tendon reflex exam. Skin: No petechial rash or dryness.  Appeared moist.  Psychiatric: Mood and affect appeared appropriate.         Lab Results: Lab Results  Component Value Date   WBC 5.5 04/23/2019   HGB 11.9 (L) 04/23/2019   HCT 37.1 (L) 04/23/2019   MCV 92.8 04/23/2019   PLT 316 04/23/2019     Chemistry      Component Value Date/Time   NA 138 04/23/2019 0936   K 4.1 04/23/2019 0936   CL 107 04/23/2019 0936   CO2 23 04/23/2019 0936   BUN 10 04/23/2019 0936   CREATININE 0.73 04/23/2019 0936      Component Value Date/Time   CALCIUM 8.5 (L) 04/23/2019 0936   ALKPHOS 114 04/23/2019 0936   AST 16 04/23/2019 0936  ALT 8 04/23/2019 0936   BILITOT 0.4 04/23/2019 0936     Results for NICHOLES, HIBLER (MRN [id number]) as of 04/25/2019 07:23  Ref. Range 02/19/2019 09:33 04/23/2019 09:36  Prostate Specific Ag, Serum Latest Ref Range: 0.0 - 4.0 ng/mL 51.0 (H) 115.0 (H)     EXAM: CT CHEST, ABDOMEN, AND PELVIS WITH CONTRAST  TECHNIQUE: Multidetector CT imaging of the chest, abdomen and pelvis was performed following the standard protocol during bolus administration of intravenous contrast.  CONTRAST:  120mL OMNIPAQUE IOHEXOL 300 MG/ML SOLN, additional oral enteric contrast  COMPARISON:  07/23/2018, MR lumbar spine, 01/25/2019  FINDINGS: CT CHEST FINDINGS  Cardiovascular: Scattered coronary artery calcifications. Normal heart size. No pericardial effusion.  Mediastinum/Nodes: There are newly enlarged mediastinal and right hilar lymph nodes, measuring up to 1.4 cm in short axis (series 3, image 27). Thyroid gland, trachea, and esophagus demonstrate no significant findings.  Lungs/Pleura: Tiny centrilobular nodules, likely sequelae of infection or inflammation such as smoking-related  respiratory bronchiolitis. Scattered ground-glass opacities of the right upper lobe (series 8, image 63). No pleural effusion or pneumothorax.  Musculoskeletal: No chest wall mass or suspicious bone lesions identified.  CT ABDOMEN PELVIS FINDINGS  Hepatobiliary: There is a new hypodense lesion of the right lobe of the liver measuring 6.0 x 5.3 cm (series 3, image 55). No gallstones, gallbladder wall thickening, or biliary dilatation.  Pancreas: Unremarkable. No pancreatic ductal dilatation or surrounding inflammatory changes.  Spleen: Normal in size without focal abnormality.  Adrenals/Urinary Tract: Interval enlargement of a left adrenal mass, measuring 6.0 x 4.4 cm, previously 3.4 cm (series 3, image 56). Large exophytic cyst of the left kidney. No calculi or hydronephrosis. Bladder is unremarkable.  Stomach/Bowel: Stomach is within normal limits. Appendix appears normal. No evidence of bowel wall thickening, distention, or inflammatory changes.  Vascular/Lymphatic: Mixed calcific atherosclerosis. There are numerous new and enlarged bulky retroperitoneal and iliac lymph nodes, the largest matted lymph nodes of the right iliac chain measuring at least 2.5 cm (series 3, image 85).  Reproductive: Evaluation of the pelvis is limited by bilateral hip total arthroplasty and associated streak artifact. Brachytherapy pellets are present prostate.  Other: No abdominal wall hernia or abnormality. No abdominopelvic ascites.  Musculoskeletal: Redemonstrated vertebral body fracture deformities of L3 and L5. Increased sclerosis of an osseous metastasis of the left ischial ramus (series 3, image 127).  IMPRESSION: 1. There are newly enlarged mediastinal and right hilar lymph nodes, a new hypodense lesion of the right lobe of the liver, interval enlargement of a left adrenal mass, and numerous new and enlarged bulky retroperitoneal and iliac lymph nodes, and increased  sclerosis of an osseous metastasis of the left ischial ramus (series 3, image 127). findings consistent with worsening metastatic disease.  2. Scattered nonspecific infectious or inflammatory ground-glass opacities of the right upper lobe (series 8, image 63). Attention on follow-up.  3.  Chronic and incidental findings as detailed above.     78 year old man with the:  1.  Advanced prostate cancer with disease to the bone and lymphadenopathy that is currently castration-resistant since 2018.  He has been on Zytiga although his PSA has been rising.  His PSA increased up to 115 on May 5 of 2020.  Imaging studies including CT scan of the chest abdomen and pelvis obtained on May 5 of 2020 showed progression of disease with increased lymphadenopathy and possible hepatic metastasis.  The treatment options at this time were reviewed and given the visceral metastasis involvement I have recommended  proceeding with systemic chemotherapy.  He has been exposed to Taxotere chemotherapy although he has not received [name] previously.  Complication associated with [name] chemotherapy were reviewed today.  These would include nausea, vomiting, myelosuppression, neutropenia and neutropenic sepsis.  The side effects were also discussed with the patient's wife over the phone and he and his wife are agreeable to proceed.  He will receive 20 mg per metered square every 3 weeks for goal of 10 cycles.   2.  Spinal metastasis: No recent exacerbation noted at this time.  MRI scheduled in the near future for follow-up.  3.  Bone directed therapy: Risks and benefits of continuing Delton See was discussed today.  Complications include osteonecrosis of the jaw and hypocalcemia were reiterated.  This will be resumed with chemotherapy.  4.  Androgen deprivation: I recommend continuing this indefinitely he is currently receiving it under the care of Dr. [name].  5.  Pain: Manageable at this time with oxycodone which  was refilled for him as well as Ultram.  6.  Antiemetics: Prescription for Compazine was made available to him.  7.  IV access: Risks and benefits of Port-A-Cath insertion was discussed today.  Complications include bleeding, thrombosis and infection are noted but rare.  He is agreeable to proceed.  8.  Growth factor support: He will receive Neulasta onpro with each cycle of therapy given his high risk of neutropenia and neutropenic sepsis.  9.  Prognosis and goals of care: His disease is incurable although aggressive therapy is warranted given his good performance status.  10.  Follow-up: We will be in the immediate future to start chemotherapy and subsequently MD follow-up before each cycle of therapy.  25  minutes was spent with the patient face-to-face today.  More than 50% of time was dedicated spent on reviewing his disease status, laboratory data, imaging studies and answering questions regarding future plan of care.    Zola Button, MD 5/7/20208:44 AM

## 2019-04-25 NOTE — Telephone Encounter (Signed)
He can keep taking it till the start of chemotherapy. He is to stop the day before treatment. Thanks.

## 2019-04-25 NOTE — Telephone Encounter (Signed)
Patient called to clarify drug change from todays visit.  He understands that he is going to stop the [place] and will start a new treatment plan.  He already had his dose for today.  New treatment plan has yet to be authorized and scheduled and that start date is TBD.  He wants to clarify should he stop the medication effective today or continue to take up until he has his new treatment?  Routed to MD

## 2019-04-25 NOTE — Telephone Encounter (Signed)
Patient made aware. No further questions.

## 2019-04-26 ENCOUNTER — Ambulatory Visit
Admission: RE | Admit: 2019-04-26 | Discharge: 2019-04-26 | Disposition: A | Discharge status: Home / Self Care | Payer: Medicare Other | Source: Ambulatory Visit | Attending: Radiation Oncology | Admitting: Radiation Oncology

## 2019-04-26 ENCOUNTER — Telehealth: Payer: Self-pay | Admitting: *Deleted

## 2019-04-26 ENCOUNTER — Telehealth: Payer: Self-pay | Admitting: Radiation Oncology

## 2019-04-26 DIAGNOSIS — C7951 Secondary malignant neoplasm of bone: Secondary | ICD-10-CM

## 2019-04-26 MED ORDER — GADOBENATE DIMEGLUMINE 529 MG/ML IV SOLN
18.0000 mL | Freq: Once | INTRAVENOUS | Status: AC | PRN
  Administered 2019-04-26: 18 mL via INTRAVENOUS

## 2019-04-26 NOTE — Telephone Encounter (Signed)
Talked with wife earlier today & she would like to p/u pt ed packet on Monday to have on hand for teaching on tues.  Left at front desk for p/u.

## 2019-04-26 NOTE — Telephone Encounter (Signed)
New Message:     LVM for patient to return call to give me his email address to set up webex for appt on 05/12.

## 2019-04-28 ENCOUNTER — Encounter: Payer: Self-pay | Admitting: Urology

## 2019-04-28 ENCOUNTER — Encounter: Payer: Self-pay | Admitting: Oncology

## 2019-04-29 ENCOUNTER — Telehealth: Payer: Self-pay | Admitting: Oncology

## 2019-04-29 ENCOUNTER — Inpatient Hospital Stay: Payer: Medicare Other

## 2019-04-29 ENCOUNTER — Telehealth: Payer: Self-pay

## 2019-04-29 MED FILL — RESTASIS 0.05% EYE EMULSION: 0.05 | 30 days supply | Qty: 60 | Fill #1

## 2019-04-29 NOTE — Telephone Encounter (Signed)
Called regarding schedule °

## 2019-04-29 NOTE — Telephone Encounter (Signed)
-----   Message from Theodosia Quay sent at 04/29/2019 11:56 AM EDT ----- Regarding: 5/11 Schedule Message Re Chemo 5/21 Hi Kalisha Keadle, Re the schedule message below. Mr. Wanat was on the infusion add on list. He has been scheduled.  Thanks, [employer]  Entered by Tami Lin on 04/29/2019 at 10:04 AM Priority: High  <No visit type provided> Department: [facility] Provider: [name], MD Scheduling Notes: Pt. is scheduled for chemo ED on 5/12 and port placement 5/13. Per Dr. [name], patient should start chemo on 5/21, not 6/11. Please add patient on for a visit with Dr. [name] and first infusion on 5/21. Visits to follow as scheduled on 6/11 for second infusion. Thanks

## 2019-04-30 ENCOUNTER — Other Ambulatory Visit: Payer: Self-pay | Admitting: Radiology

## 2019-04-30 ENCOUNTER — Other Ambulatory Visit: Payer: Self-pay

## 2019-04-30 ENCOUNTER — Inpatient Hospital Stay: Payer: Medicare Other

## 2019-04-30 ENCOUNTER — Telehealth: Payer: Self-pay | Admitting: *Deleted

## 2019-04-30 ENCOUNTER — Ambulatory Visit: Payer: Medicare Other | Admitting: Urology

## 2019-04-30 NOTE — Telephone Encounter (Signed)
CALLED PATIENT TO INFORM THAT HE DOESN'T NEED TO COME IN FOR FU ON 05-01-19, INFORMED THAT [facility] WILL DO WEB-EX APPT. FOR HIM, PATIENT VERIFIED UNDERSTANDING THIS

## 2019-05-01 ENCOUNTER — Encounter (HOSPITAL_COMMUNITY): Payer: Self-pay

## 2019-05-01 ENCOUNTER — Ambulatory Visit (HOSPITAL_COMMUNITY)
Admission: RE | Admit: 2019-05-01 | Discharge: 2019-05-01 | Disposition: A | Discharge status: Home / Self Care | Payer: Medicare Other | Source: Ambulatory Visit | Attending: Oncology | Admitting: Oncology

## 2019-05-01 ENCOUNTER — Ambulatory Visit: Payer: Self-pay | Admitting: Urology

## 2019-05-01 ENCOUNTER — Other Ambulatory Visit: Payer: Self-pay | Admitting: Oncology

## 2019-05-01 ENCOUNTER — Ambulatory Visit
Admission: RE | Admit: 2019-05-01 | Discharge: 2019-05-01 | Disposition: A | Discharge status: Home / Self Care | Payer: Medicare Other | Source: Ambulatory Visit | Attending: Urology | Admitting: Urology

## 2019-05-01 ENCOUNTER — Ambulatory Visit: Payer: Medicare Other | Admitting: Urology

## 2019-05-01 DIAGNOSIS — K219 Gastro-esophageal reflux disease without esophagitis: Secondary | ICD-10-CM | POA: Insufficient documentation

## 2019-05-01 DIAGNOSIS — M1990 Unspecified osteoarthritis, unspecified site: Secondary | ICD-10-CM | POA: Diagnosis not present

## 2019-05-01 DIAGNOSIS — C61 Malignant neoplasm of prostate: Secondary | ICD-10-CM

## 2019-05-01 DIAGNOSIS — H269 Unspecified cataract: Secondary | ICD-10-CM | POA: Insufficient documentation

## 2019-05-01 DIAGNOSIS — Z87891 Personal history of nicotine dependence: Secondary | ICD-10-CM | POA: Insufficient documentation

## 2019-05-01 DIAGNOSIS — Z8042 Family history of malignant neoplasm of prostate: Secondary | ICD-10-CM | POA: Diagnosis not present

## 2019-05-01 DIAGNOSIS — Z808 Family history of malignant neoplasm of other organs or systems: Secondary | ICD-10-CM | POA: Insufficient documentation

## 2019-05-01 DIAGNOSIS — E785 Hyperlipidemia, unspecified: Secondary | ICD-10-CM | POA: Insufficient documentation

## 2019-05-01 DIAGNOSIS — Z79899 Other long term (current) drug therapy: Secondary | ICD-10-CM | POA: Insufficient documentation

## 2019-05-01 DIAGNOSIS — C7951 Secondary malignant neoplasm of bone: Secondary | ICD-10-CM | POA: Diagnosis not present

## 2019-05-01 DIAGNOSIS — K2270 Barrett's esophagus without dysplasia: Secondary | ICD-10-CM | POA: Diagnosis not present

## 2019-05-01 DIAGNOSIS — Z888 Allergy status to other drugs, medicaments and biological substances status: Secondary | ICD-10-CM | POA: Diagnosis not present

## 2019-05-01 DIAGNOSIS — C7949 Secondary malignant neoplasm of other parts of nervous system: Secondary | ICD-10-CM | POA: Diagnosis not present

## 2019-05-01 DIAGNOSIS — Z452 Encounter for adjustment and management of vascular access device: Secondary | ICD-10-CM | POA: Diagnosis not present

## 2019-05-01 HISTORY — PX: IR IMAGING GUIDED PORT INSERTION: IMG5740

## 2019-05-01 LAB — CBC WITH DIFFERENTIAL/PLATELET
Abs Immature Granulocytes: 0.04 10*3/uL (ref 0.00–0.07)
Basophils Absolute: 0.1 10*3/uL (ref 0.0–0.1)
Basophils Relative: 1 %
Eosinophils Absolute: 0.1 10*3/uL (ref 0.0–0.5)
Eosinophils Relative: 1 %
HCT: 39.3 % (ref 39.0–52.0)
Hemoglobin: 12.8 g/dL — ABNORMAL LOW (ref 13.0–17.0)
Immature Granulocytes: 1 %
Lymphocytes Relative: 12 %
Lymphs Abs: 0.8 10*3/uL (ref 0.7–4.0)
MCH: 30.8 pg (ref 26.0–34.0)
MCHC: 32.6 g/dL (ref 30.0–36.0)
MCV: 94.7 fL (ref 80.0–100.0)
Monocytes Absolute: 0.8 10*3/uL (ref 0.1–1.0)
Monocytes Relative: 12 %
Neutro Abs: 4.9 10*3/uL (ref 1.7–7.7)
Neutrophils Relative %: 73 %
Platelets: 316 10*3/uL (ref 150–400)
RBC: 4.15 MIL/uL — ABNORMAL LOW (ref 4.22–5.81)
RDW: 14.8 % (ref 11.5–15.5)
WBC: 6.7 10*3/uL (ref 4.0–10.5)
nRBC: 0 % (ref 0.0–0.2)

## 2019-05-01 LAB — PROTIME-INR
INR: 1 (ref 0.8–1.2)
Prothrombin Time: 13 seconds (ref 11.4–15.2)

## 2019-05-01 MED ORDER — CEFAZOLIN SODIUM-DEXTROSE 2-4 GM/100ML-% IV SOLN
2.0000 g | INTRAVENOUS | Status: AC
  Administered 2019-05-01: 09:00:00 2 g via INTRAVENOUS

## 2019-05-01 MED ORDER — LIDOCAINE HCL (PF) 1 % IJ SOLN
INTRAMUSCULAR | Status: AC | PRN
  Administered 2019-05-01: 10 mL

## 2019-05-01 MED ORDER — SODIUM CHLORIDE 0.9 % IV SOLN
INTRAVENOUS | Status: DC
  Administered 2019-05-01: 08:00:00 via INTRAVENOUS

## 2019-05-01 MED ORDER — CEFAZOLIN SODIUM-DEXTROSE 2-4 GM/100ML-% IV SOLN
INTRAVENOUS | Status: AC
  Administered 2019-05-01: 09:00:00 2 g via INTRAVENOUS
  Filled 2019-05-01: qty 100

## 2019-05-01 MED ORDER — FENTANYL CITRATE (PF) 100 MCG/2ML IJ SOLN
INTRAMUSCULAR | Status: AC | PRN
  Administered 2019-05-01 (×2): 50 ug via INTRAVENOUS

## 2019-05-01 MED ORDER — LIDOCAINE HCL 1 % IJ SOLN
INTRAMUSCULAR | Status: AC
  Filled 2019-05-01: qty 20

## 2019-05-01 MED ORDER — FENTANYL CITRATE (PF) 100 MCG/2ML IJ SOLN
INTRAMUSCULAR | Status: AC
  Filled 2019-05-01: qty 2

## 2019-05-01 MED ORDER — LIDOCAINE-EPINEPHRINE 1 %-1:100000 IJ SOLN
INTRAMUSCULAR | Status: AC
  Filled 2019-05-01: qty 1

## 2019-05-01 MED ORDER — MIDAZOLAM HCL 2 MG/2ML IJ SOLN
INTRAMUSCULAR | Status: AC | PRN
  Administered 2019-05-01 (×2): 1 mg via INTRAVENOUS

## 2019-05-01 MED ORDER — LIDOCAINE-EPINEPHRINE 2 %-1:100000 IJ SOLN
INTRAMUSCULAR | Status: AC | PRN
  Administered 2019-05-01: 10 mL

## 2019-05-01 MED ORDER — HEPARIN SOD (PORK) LOCK FLUSH 100 UNIT/ML IV SOLN
INTRAVENOUS | Status: AC | PRN
  Administered 2019-05-01: 500 [IU] via INTRAVENOUS

## 2019-05-01 MED ORDER — HEPARIN SOD (PORK) LOCK FLUSH 100 UNIT/ML IV SOLN
INTRAVENOUS | Status: AC
  Filled 2019-05-01: qty 5

## 2019-05-01 MED ORDER — MIDAZOLAM HCL 2 MG/2ML IJ SOLN
INTRAMUSCULAR | Status: AC
  Filled 2019-05-01: qty 2

## 2019-05-01 NOTE — Discharge Instructions (Addendum)
Implanted Port Insertion, Care After °This sheet gives you information about how to care for yourself after your procedure. Your health care provider may also give you more specific instructions. If you have problems or questions, contact your health care provider. °What can I expect after the procedure? °After the procedure, it is common to have: °· Discomfort at the port insertion site. °· Bruising on the skin over the port. This should improve over 3-4 days. °Follow these instructions at home: °Port care °· After your port is placed, you will get a manufacturer's information card. The card has information about your port. Keep this card with you at all times. °· Take care of the port as told by your health care provider. Ask your health care provider if you or a family member can get training for taking care of the port at home. A home health care nurse may also take care of the port. °· Make sure to remember what type of port you have. °Incision care ° °  ° °· Follow instructions from your health care provider about how to take care of your port insertion site. Make sure you: °? Wash your hands with soap and water before and after you change your bandage (dressing). If soap and water are not available, use hand sanitizer. °? Change your dressing as told by your health care provider. °? Leave stitches (sutures), skin glue, or adhesive strips in place. These skin closures may need to stay in place for 2 weeks or longer. If adhesive strip edges start to loosen and curl up, you may trim the loose edges. Do not remove adhesive strips completely unless your health care provider tells you to do that. °· Check your port insertion site every day for signs of infection. Check for: °? Redness, swelling, or pain. °? Fluid or blood. °? Warmth. °? Pus or a bad smell. °Activity °· Return to your normal activities as told by your health care provider. Ask your health care provider what activities are safe for you. °· Do not  lift anything that is heavier than 10 lb (4.5 kg), or the limit that you are told, until your health care provider says that it is safe. °General instructions °· Take over-the-counter and prescription medicines only as told by your health care provider. °· Do not take baths, swim, or use a hot tub until your health care provider approves. Ask your health care provider if you may take showers. You may only be allowed to take sponge baths. °· Do not drive for 24 hours if you were given a sedative during your procedure. °· Wear a medical alert bracelet in case of an emergency. This will tell any health care providers that you have a port. °· Keep all follow-up visits as told by your health care provider. This is important. °Contact a health care provider if: °· You cannot flush your port with saline as directed, or you cannot draw blood from the port. °· You have a fever or chills. °· You have redness, swelling, or pain around your port insertion site. °· You have fluid or blood coming from your port insertion site. °· Your port insertion site feels warm to the touch. °· You have pus or a bad smell coming from the port insertion site. °Get help right away if: °· You have chest pain or shortness of breath. °· You have bleeding from your port that you cannot control. °Summary °· Take care of the port as told by your health   care provider. Keep the manufacturer's information card with you at all times. °· Change your dressing as told by your health care provider. °· Contact a health care provider if you have a fever or chills or if you have redness, swelling, or pain around your port insertion site. °· Keep all follow-up visits as told by your health care provider. °This information is not intended to replace advice given to you by your health care provider. Make sure you discuss any questions you have with your health care provider. °Document Released: 09/25/2013 Document Revised: 07/03/2018 Document Reviewed:  07/03/2018 °[employer] Interactive Patient Education © 2019 [employer] ° °Moderate Conscious Sedation, Adult, Care After °These instructions provide you with information about caring for yourself after your procedure. Your health care provider may also give you more specific instructions. Your treatment has been planned according to current medical practices, but problems sometimes occur. Call your health care provider if you have any problems or questions after your procedure. °What can I expect after the procedure? °After your procedure, it is common: °· To feel sleepy for several hours. °· To feel clumsy and have poor balance for several hours. °· To have poor judgment for several hours. °· To vomit if you eat too soon. °Follow these instructions at home: °For at least 24 hours after the procedure: ° °· Do not: °? Participate in activities where you could fall or become injured. °? Drive. °? Use heavy machinery. °? Drink alcohol. °? Take sleeping pills or medicines that cause drowsiness. °? Make important decisions or sign legal documents. °? Take care of children on your own. °· Rest. °Eating and drinking °· Follow the diet recommended by your health care provider. °· If you vomit: °? Drink water, juice, or soup when you can drink without vomiting. °? Make sure you have little or no nausea before eating solid foods. °General instructions °· Have a responsible adult stay with you until you are awake and alert. °· Take over-the-counter and prescription medicines only as told by your health care provider. °· If you smoke, do not smoke without supervision. °· Keep all follow-up visits as told by your health care provider. This is important. °Contact a health care provider if: °· You keep feeling nauseous or you keep vomiting. °· You feel light-headed. °· You develop a rash. °· You have a fever. °Get help right away if: °· You have trouble breathing. °This information is not intended to replace advice given to you  by your health care provider. Make sure you discuss any questions you have with your health care provider. °Document Released: 09/25/2013 Document Revised: 05/09/2016 Document Reviewed: 03/26/2016 °[employer] Interactive Patient Education © 2019 [employer] ° °

## 2019-05-01 NOTE — Consult Note (Signed)
Chief Complaint: Patient was seen in consultation today for port a cath placement  Referring Physician(s): Shadad,Firas N  Supervising Physician: Markus Daft  Patient Status: [place]  History of Present Illness: [name] is a 78 y.o. male with history of prostate cancer, initially diagnosed in 2018, who now presents with progressive disease.  He is scheduled today for Port-A-Cath placement for chemotherapy.  Additional history as below.    Past Medical History:  Diagnosis Date   Arthritis    Barrett's esophagus    Bone cancer ([place])    Cataract    GERD (gastroesophageal reflux disease)    takes Protonix daily   History of colon polyps    Hyperlipidemia    takes Zetia and Welchol daily   Joint pain    Muscle spasm    takes Robaxin daily as needed   Prostate cancer [facility])     Past Surgical History:  Procedure Laterality Date   COLONOSCOPY     ESOPHAGOGASTRODUODENOSCOPY     KNEE ARTHROSCOPY Right 2011   LUMBAR LAMINECTOMY/DECOMPRESSION MICRODISCECTOMY Left 08/06/2018   Procedure: Laminectomy and Foraminotomy - Lumbar Three-Four- left;  Surgeon: Kary Kos, MD;  Location: [place];  Service: Neurosurgery;  Laterality: Left;  left   TOTAL HIP ARTHROPLASTY Left 05/07/2013   Procedure: LEFT TOTAL HIP ARTHROPLASTY ANTERIOR APPROACH;  Surgeon: Mcarthur Rossetti, MD;  Location: [place];  Service: Orthopedics;  Laterality: Left;   TOTAL HIP ARTHROPLASTY Right    TOTAL HIP REVISION Right 02/02/2015   Procedure: TOTAL HIP REVISION;  Surgeon: Kerin Salen, MD;  Location: [place];  Service: Orthopedics;  Laterality: Right;    Allergies: Statins  Medications: Prior to Admission medications   Medication Sig Start Date End Date Taking? Authorizing Provider  Calcium Carb-Cholecalciferol (CALCIUM 1000 + D PO) Take 30 mLs by mouth daily. Calcium 1000 mg / Vit D 1000 iu   Yes [provider]  cycloSPORINE (RESTASIS) 0.05 % ophthalmic emulsion  Place 1 drop into both eyes 2 (two) times daily.   Yes [provider]  docusate sodium (COLACE) 100 MG capsule Take 100 mg by mouth 2 (two) times daily.   Yes [provider]  ezetimibe (ZETIA) 10 MG tablet Take 10 mg by mouth every morning.    Yes [provider]  ibuprofen (ADVIL,MOTRIN) 200 MG tablet Take 200 mg by mouth every 6 (six) hours as needed for moderate pain.   Yes [provider]  oxyCODONE (OXY IR/ROXICODONE) 5 MG immediate release tablet Take 1-2 tablets (5-10 mg total) by mouth every 4 (four) hours as needed for severe pain. 04/25/19  Yes [name], MD  pantoprazole (PROTONIX) 40 MG tablet Take 40 mg by mouth daily after supper.    Yes [provider]  predniSONE (DELTASONE) 5 MG tablet TAKE 1 TABLET BY MOUTH 2 TIMES DAILY WITH A MEAL. 04/22/19  Yes [name], MD  tamsulosin (FLOMAX) 0.4 MG CAPS capsule Take 0.4 mg by mouth daily.  08/10/16  Yes [provider]  traMADol (ULTRAM) 50 MG tablet TAKE 1 TABLET BY MOUTH EVERY 6 HOURS AS NEEDED FOR PAIN 03/29/19  Yes [name], MD  triamcinolone cream (KENALOG) 0.1 % Apply 1 application topically daily as needed (dry skin in the winter).    Yes [provider]  ZYTIGA 250 MG tablet TAKE 4 TABLETS (1,000 MG TOTAL) BY MOUTH DAILY. TAKE ON AN EMPTY STOMACH 1 HOUR BEFORE OR 2 HOURS AFTER A MEAL  04/08/19  Yes [name], MD  leuprolide (LUPRON) 30 MG injection Inject 45 mg into the muscle every 6 (six) months.    [provider]  lidocaine-prilocaine (EMLA) cream Apply 1 application topically as needed. 04/25/19   [name], MD  ondansetron (ZOFRAN) 4 MG tablet Take 4 mg by mouth every 8 (eight) hours as needed for nausea or vomiting.    [provider]  polyethylene glycol (MIRALAX / GLYCOLAX) packet Take 17 g by mouth daily as needed for mild constipation. 08/17/18   Geradine Girt, DO  prochlorperazine (COMPAZINE) 10 MG tablet Take 1 tablet  (10 mg total) by mouth every 6 (six) hours as needed for nausea or vomiting. 04/25/19   [name], MD     Family History  Problem Relation Age of Onset   Colon cancer Mother    Colon cancer Brother    Prostate cancer Brother     Social History   Socioeconomic History   Marital status: Married    Spouse name: Not on file   Number of children: 3   Years of education: Not on file   Highest education level: Not on file  Occupational History   Not on file  Social Needs   Financial resource strain: Not on file   Food insecurity:    Worry: Not on file    Inability: Not on file   Transportation needs:    Medical: Not on file    Non-medical: Not on file  Tobacco Use   Smoking status: Former Smoker    Packs/day: 0.25    Years: 2.00    Pack years: 0.50    Last attempt to quit: 12/19/1968    Years since quitting: 50.3   Smokeless tobacco: Former Systems developer   Tobacco comment: quit smoking 62yrs ago  Substance and Sexual Activity   Alcohol use: No   Drug use: No   Sexual activity: Not Currently  Lifestyle   Physical activity:    Days per week: Not on file    Minutes per session: Not on file   Stress: Not on file  Relationships   Social connections:    Talks on phone: Not on file    Gets together: Not on file    Attends religious service: Not on file    Active member of club or organization: Not on file    Attends meetings of clubs or organizations: Not on file    Relationship status: Not on file  Other Topics Concern   Not on file  Social History Narrative   Not on file      Review of Systems denies fever, headache, chest pain, dyspnea, cough, abdominal pain, nausea, vomiting.  He does have back pain and  some occasional rectal bleeding.  Vital Signs: BP (!) 167/100    Pulse 88    Temp 97.9 F (36.6 C) (Oral)    Resp 18    Ht 5\' 11"  (1.803 m)    Wt 190 lb 14.4 oz (86.6 kg)    SpO2 100%    BMI 26.63 kg/m   Physical Exam awake, alert.  Chest  clear to auscultation bilaterally.  Heart with regular rate and rhythm.  Abdomen soft, positive bowel sounds, nontender.  Trace pretibial edema bilaterally.  Imaging: Ct Chest W Contrast  Result Date: 04/23/2019 CLINICAL DATA:  Follow-up metastatic prostate cancer EXAM: CT CHEST, ABDOMEN, AND PELVIS WITH CONTRAST TECHNIQUE: Multidetector CT imaging of the chest, abdomen and pelvis was performed  following the standard protocol during bolus administration of intravenous contrast. CONTRAST:  167mL OMNIPAQUE IOHEXOL 300 MG/ML SOLN, additional oral enteric contrast COMPARISON:  07/23/2018, MR lumbar spine, 01/25/2019 FINDINGS: CT CHEST FINDINGS Cardiovascular: Scattered coronary artery calcifications. Normal heart size. No pericardial effusion. Mediastinum/Nodes: There are newly enlarged mediastinal and right hilar lymph nodes, measuring up to 1.4 cm in short axis (series 3, image 27). Thyroid gland, trachea, and esophagus demonstrate no significant findings. Lungs/Pleura: Tiny centrilobular nodules, likely sequelae of infection or inflammation such as smoking-related respiratory bronchiolitis. Scattered ground-glass opacities of the right upper lobe (series 8, image 63). No pleural effusion or pneumothorax. Musculoskeletal: No chest wall mass or suspicious bone lesions identified. CT ABDOMEN PELVIS FINDINGS Hepatobiliary: There is a new hypodense lesion of the right lobe of the liver measuring 6.0 x 5.3 cm (series 3, image 55). No gallstones, gallbladder wall thickening, or biliary dilatation. Pancreas: Unremarkable. No pancreatic ductal dilatation or surrounding inflammatory changes. Spleen: Normal in size without focal abnormality. Adrenals/Urinary Tract: Interval enlargement of a left adrenal mass, measuring 6.0 x 4.4 cm, previously 3.4 cm (series 3, image 56). Large exophytic cyst of the left kidney. No calculi or hydronephrosis. Bladder is unremarkable. Stomach/Bowel: Stomach is within normal limits. Appendix  appears normal. No evidence of bowel wall thickening, distention, or inflammatory changes. Vascular/Lymphatic: Mixed calcific atherosclerosis. There are numerous new and enlarged bulky retroperitoneal and iliac lymph nodes, the largest matted lymph nodes of the right iliac chain measuring at least 2.5 cm (series 3, image 85). Reproductive: Evaluation of the pelvis is limited by bilateral hip total arthroplasty and associated streak artifact. Brachytherapy pellets are present prostate. Other: No abdominal wall hernia or abnormality. No abdominopelvic ascites. Musculoskeletal: Redemonstrated vertebral body fracture deformities of L3 and L5. Increased sclerosis of an osseous metastasis of the left ischial ramus (series 3, image 127). IMPRESSION: 1. There are newly enlarged mediastinal and right hilar lymph nodes, a new hypodense lesion of the right lobe of the liver, interval enlargement of a left adrenal mass, and numerous new and enlarged bulky retroperitoneal and iliac lymph nodes, and increased sclerosis of an osseous metastasis of the left ischial ramus (series 3, image 127). findings consistent with worsening metastatic disease. 2. Scattered nonspecific infectious or inflammatory ground-glass opacities of the right upper lobe (series 8, image 63). Attention on follow-up. 3.  Chronic and incidental findings as detailed above. Electronically Signed   By: Eddie Candle M.D.   On: 04/23/2019 16:09   Mr Lumbar Spine W Wo Contrast  Result Date: 04/26/2019 CLINICAL DATA:  Metastatic prostate cancer. Decompressive laminectomy in tumor resection at L3 in August of last year. Stereotactic radio surgery of metastatic disease at L1 and L3 completed in November of last year. Difficulty walking and balance disturbance. EXAM: MRI LUMBAR SPINE WITHOUT AND WITH CONTRAST TECHNIQUE: Multiplanar and multiecho pulse sequences of the lumbar spine were obtained without and with intravenous contrast. CONTRAST:  72mL MULTIHANCE  GADOBENATE DIMEGLUMINE 529 MG/ML IV SOLN COMPARISON:  01/25/2019.  CT 04/23/2019. FINDINGS: Segmentation: 5 lumbar type vertebral bodies as numbered previously. Alignment:  Straightening of the normal lumbar lordosis. Vertebrae:  Inferior T11 and T12 remain negative. Treated metastatic lesion within the right posterior corner of the L1 vertebral body with early extension into the right pedicle measures 1 mm larger than on the previous exam. Tumor shows minimal involvement of the superior foramen on the right without apparent compression of the right L1 nerve. No tumor seen within the L2 vertebral body. Pathologic fracture of  L3 appears the same. Loss of height of 80% centrally. 1 cm of posterior bulging of the posterosuperior margin of the vertebral body. Previous posterior decompression at this level. New regional marrow space lesion in the anterior portion of the L4 vertebral body, measuring up to 2.5 cm in size. This is consistent with a new and rapidly progressive metastatic lesion. L5 shows an old benign superior endplate fracture. No sign of tumor affecting the L5 vertebral body. S1 and S2 are negative for tumor. Conus medullaris and cauda equina: Conus extends to the L1 level. Conus and cauda equina appear normal. Paraspinal and other soft tissues: Progressive retroperitoneal adenopathy. Retroperitoneal lymph node anterior to the IVC at the L4 level now measures 2.9 cm in length compared with 1.8 cm in February. Disc levels: L1-2: Chronic disc bulge without compressive stenosis. L2-3: Previous posterior decompression on the left. 1 cm posterior bulging of the posterosuperior margin of the L3 vertebral body as noted above. This indents the thecal sac. Narrowing of the right lateral recess without distinct neural compression. L3-4: Bulging of the disc. Facet and ligamentous hypertrophy. Mild stenosis of both lateral recesses and foramina. L4-5: Bulging of the disc. Mild facet and ligamentous hypertrophy. Mild  bilateral lateral recess and foraminal narrowing without distinct neural compression. L5-S1: No disc abnormality. Minimal facet hypertrophy. No stenosis. IMPRESSION: Treated lesion in the right posterior L1 vertebral body and pedicle is 1 mm larger, nearly stable. Very early involvement of the superior foramen on the right without gross compression of the exiting right L1 nerve. Treated pathologic fracture at L3 without evidence of progressive disease. Previous posterior decompression on the left. 1 cm posterior bulging of the posterosuperior margin of the L3 vertebral body is unchanged. New metastatic lesion within the anterior L4 vertebral body measuring up to 2.5 cm in diameter. Stable benign superior endplate compression fracture at L5. Considerable progression of retroperitoneal lymphadenopathy since February. Electronically Signed   By: Nelson Chimes M.D.   On: 04/26/2019 11:05   Ct Abdomen Pelvis W Contrast  Result Date: 04/23/2019 CLINICAL DATA:  Follow-up metastatic prostate cancer EXAM: CT CHEST, ABDOMEN, AND PELVIS WITH CONTRAST TECHNIQUE: Multidetector CT imaging of the chest, abdomen and pelvis was performed following the standard protocol during bolus administration of intravenous contrast. CONTRAST:  150mL OMNIPAQUE IOHEXOL 300 MG/ML SOLN, additional oral enteric contrast COMPARISON:  07/23/2018, MR lumbar spine, 01/25/2019 FINDINGS: CT CHEST FINDINGS Cardiovascular: Scattered coronary artery calcifications. Normal heart size. No pericardial effusion. Mediastinum/Nodes: There are newly enlarged mediastinal and right hilar lymph nodes, measuring up to 1.4 cm in short axis (series 3, image 27). Thyroid gland, trachea, and esophagus demonstrate no significant findings. Lungs/Pleura: Tiny centrilobular nodules, likely sequelae of infection or inflammation such as smoking-related respiratory bronchiolitis. Scattered ground-glass opacities of the right upper lobe (series 8, image 63). No pleural effusion  or pneumothorax. Musculoskeletal: No chest wall mass or suspicious bone lesions identified. CT ABDOMEN PELVIS FINDINGS Hepatobiliary: There is a new hypodense lesion of the right lobe of the liver measuring 6.0 x 5.3 cm (series 3, image 55). No gallstones, gallbladder wall thickening, or biliary dilatation. Pancreas: Unremarkable. No pancreatic ductal dilatation or surrounding inflammatory changes. Spleen: Normal in size without focal abnormality. Adrenals/Urinary Tract: Interval enlargement of a left adrenal mass, measuring 6.0 x 4.4 cm, previously 3.4 cm (series 3, image 56). Large exophytic cyst of the left kidney. No calculi or hydronephrosis. Bladder is unremarkable. Stomach/Bowel: Stomach is within normal limits. Appendix appears normal. No evidence of bowel wall thickening,  distention, or inflammatory changes. Vascular/Lymphatic: Mixed calcific atherosclerosis. There are numerous new and enlarged bulky retroperitoneal and iliac lymph nodes, the largest matted lymph nodes of the right iliac chain measuring at least 2.5 cm (series 3, image 85). Reproductive: Evaluation of the pelvis is limited by bilateral hip total arthroplasty and associated streak artifact. Brachytherapy pellets are present prostate. Other: No abdominal wall hernia or abnormality. No abdominopelvic ascites. Musculoskeletal: Redemonstrated vertebral body fracture deformities of L3 and L5. Increased sclerosis of an osseous metastasis of the left ischial ramus (series 3, image 127). IMPRESSION: 1. There are newly enlarged mediastinal and right hilar lymph nodes, a new hypodense lesion of the right lobe of the liver, interval enlargement of a left adrenal mass, and numerous new and enlarged bulky retroperitoneal and iliac lymph nodes, and increased sclerosis of an osseous metastasis of the left ischial ramus (series 3, image 127). findings consistent with worsening metastatic disease. 2. Scattered nonspecific infectious or inflammatory  ground-glass opacities of the right upper lobe (series 8, image 63). Attention on follow-up. 3.  Chronic and incidental findings as detailed above. Electronically Signed   By: Eddie Candle M.D.   On: 04/23/2019 16:09    Labs:  CBC: Recent Labs    12/25/18 1112 02/19/19 0933 04/23/19 0936 05/01/19 0827  WBC 6.3 5.8 5.5 6.7  HGB 12.3* 12.3* 11.9* 12.8*  HCT 37.9* 37.4* 37.1* 39.3  PLT 276 300 316 316    COAGS: Recent Labs    08/16/18 0951  INR 1.02    BMP: Recent Labs    11/09/18 1300 12/25/18 1112 02/19/19 0933 04/23/19 0936  NA 136 139 139 138  K 4.5 4.1 3.8 4.1  CL 105 107 108 107  CO2 21* 25 21* 23  GLUCOSE 110* 91 117* 92  BUN 19 9 14 10   CALCIUM 9.2 8.9 8.8* 8.5*  CREATININE 0.76 0.75 0.75 0.73  GFRNONAA >60 >60 >60 >60  GFRAA >60 >60 >60 >60    LIVER FUNCTION TESTS: Recent Labs    11/09/18 1300 12/25/18 1112 02/19/19 0933 04/23/19 0936  BILITOT 0.6 0.6 0.4 0.4  AST 31 16 12* 16  ALT 88* 15 8 8   ALKPHOS 131* 96 105 114  PROT 5.5* 5.8* 6.1* 6.3*  ALBUMIN 3.0* 3.2* 3.1* 3.0*    TUMOR MARKERS: No results for input(s): AFPTM, CEA, CA199, CHROMGRNA in the last 8760 hours.  Assessment and Plan: 78 y.o. male with history of prostate cancer, initially diagnosed in 2018, who now presents with progressive disease.  He is scheduled today for Port-A-Cath placement for chemotherapy. Risks and benefits of image guided port-a-catheter placement was discussed with the patient including, but not limited to bleeding, infection, pneumothorax, or fibrin sheath development and need for additional procedures.  All of the patient's questions were answered, patient is agreeable to proceed. Consent signed and in chart.     Thank you for this interesting consult.  I greatly enjoyed meeting [name] and look forward to participating in their care.  A copy of this report was sent to the requesting provider on this date.  Electronically Signed: D. Rowe Robert, PA-C 05/01/2019, 8:37 AM   I spent a total of 20 minutes in face to face in clinical consultation, greater than 50% of which was counseling/coordinating care for Port-A-Cath placement

## 2019-05-01 NOTE — Procedures (Signed)
Interventional Radiology Procedure: ° ° °Indications: Prostate cancer ° °Procedure: Port placement ° °Findings: Right jugular port, tip at SVC/RA junction ° °Complications: None °    °EBL: Minimal, less than 10 ml ° °Plan: Discharge in one hour.  Keep port site and incisions dry for at least 24 hours.   ° ° °Eloisa Chokshi R. Sebastien Jackson, MD  °Pager: [phone] ° °  °

## 2019-05-01 NOTE — Progress Notes (Addendum)
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     Interval Since Last Radiation:  6 months s/p Palliative SRS      10/24/18: The targeted metastasis in theL1 and L3vertebral body were eachtreatedto a prescription dose of 18Gy to the gross disease (GTV) seen on imaging, while a secondary lower prescription dose of 14Gy was delivered to the entirety of each directly involved marrow compartment of the gross disease (CTV).   09/04/2018 - 09/17/2018:The lumbar spine target at L3-L4 was treated to 30 Gy in 10 fractions of 3 Gy.  11/05/2015 - 12/10/2015: 45 Gy in 25 fractions to the pelvis followed by LDR boost, 110 Gy on 12/23/2015 at [facility] c/o Dr. Daleen Squibb.  Narrative:  I spoke with the patient to conduct his routine scheduled 3 month follow up visit and review his recent MRI lumbar spine via telephone to spare the patient unnecessary potential exposure in the healthcare setting during the current COVID-19 pandemic.  The patient was notified in advance and gave permission to proceed with this visit format.        In summary, he was initially diagnosed with Gleason 5+4 adenocarcinoma of the prostate with a PSA of 13.66 in July 2016 under the care of Dr. Darvin Neighbours.  He elected to proceed with a 5-week course of external beam radiotherapy followed by brachytherapy boost at [facility] with Dr. Carolin Sicks between 11/05/15 - 12/2015 in combination with 2 years of androgen deprivation therapy.  PSA nadired at 1.0 in 07/2016. 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  Metastatic work-up at that  time showed bone involvement as well as lymphadenopathy.  He was treated under the care of Dr. Renato Gails at [facility], initially with enzalutamide, which was started in May 2018 and discontinued in January 2019 when he presented with progression of disease with increased bony metastasis, adrenal nodule and worsening lymphadenopathy.  At that time, his treatment was changed to Taxotere chemotherapy with his last treatment in July 2019- discontinued due to disease progression noted on repeat systemic imaging which showed a partial response with slight improvement in the bulky retroperitoneal lymphadenopathy but worsening bony metastasis based on a lumbar spine MRI obtained on June 24, 2018.  The MRI showed a complete replacement of L3 vertebral body with tumor in the ventral epidural space behind L3, greater to the left. He elected to transfer his care to Dr. Clelia Croft at the [facility] since this is closer to home and has continued on androgen deprivation therapy with Lupron under the care of Dr. Earlene Plater with his last 62-month injection given in August 2019.  He has also been receiving denosumab Rivka Barbara) every 6 weeks with his next injection scheduled for 10/2018.   Zytiga was added to his treatment regimen on 08/28/2018.         Due to progressive low back pain, he elected to proceed with surgical decompressive laminectomy on the left at L3 with complete removal of the left L3 lamina, microscopic foraminotomies of the left L3 and L4 nerve roots and microscopic removal of epidural tumor on  the left under the care of Dr. Wynetta Emery on 08/06/2018.  Final surgical pathology confirmed high-grade metastatic prostate adenocarcinoma.  He completed postoperative palliative radiotherapy to the lumbar spine at the levels of L3-L4 on 09/17/18.   He did note improvement in his back pain following treatment but continued to struggle with weakness and limitations in mobility. Over time, his pain became progressively severe resulting in  a visit to the emergency department on 10/16/2018 due to intractable pain radiating into the lower extremities with progressive weakness.  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  He was started on IV dexamethasone with significant improvement in his symptoms.  He was not felt to be a good candidate for further surgical intervention and therefore elected to proceed with an additional course of palliative SRS to the progressive/new sites of disease in the lumbar spine at L1 and L3 which was completed on 10/24/2018. He tolerated the treatment well and reported significant improvement in his pain following treatment.  His initial post-treatment MRI on 01/25/19 showed an excellent response to treatment with decreased ventral epidural tumor and overall decreased spinal stenosis. There was mild enlargement of the L1 metastasis which was felt most likely treatment related but also noted increased iliac chain lymphadenopathy. He continued on Lupron ADT q 6 months in combination with daily Zytiga.  INTERVAL HISTORY:   His most recent MRI lumbar spine from 04/26/19 shows stable treated disease at L1 and L3 and a stable benign superior endplate compression fracture at L5 but there is a new metastatic lesion within the anterior L4 vertebral body measuring up to 2.5 cm in diameter and considerable progression of retroperitoneal lymphadenopathy since February 2020.   He reports mildly progressive low back pain over the past 1-2 months but denies any new associated neurologic symptoms such as focal weakness in the lower extremities or paraesthesias.    He has continued taking Zytiga 1000 mg daily  since September 2019 with Dr. Clelia Croft as well as Lupron every 6 months for ADT under the care and direction of Dr. Earlene Plater, his urologist.  Unfortunately, his PSA has continued to increase despite treatment with a PSA of 27.6 on 12/25/2018, 51 in 02/2019 and most recently up to 115 on 04/23/19.  He had a recent follow-up visit with his medical oncologist, Dr. Clelia Croft on 04/25/2019 and the recommendation was to discontinue his Zytiga and switch to systemic chemotherapy with [name] in addition to his Lupron ADT.  He had a port-a-cath placed earlier today and is tentatively scheduled to start his systemic chemotherapy on Friday 05/10/19.  Overall, he continues to feel well and is currently without complaints aside from low back pain when he stands for any period of time.      On review of systems, the patient states that he is doing very well overall.  He initially noted significant improvement in his lower back pain following his SRS treatment in 10/2018 but has noticed a gradual progression in his low back pain with less activity over the past couple of months. He is still able to ambulate with minimal pain with the use of a cane but is unable to stand for prolonged periods of time  due to low back pain. He denies any numbness or tingling into the lower extremities and denies any radicular pain or bowel/bladder dysfunction.  He denies any significant fatigue/malaise.  He continues with some mild edema in the bilateral lower extremities that is alleviated with elevation of his LEs.  He denies any chest pain, increased shortness of breath, fever, chills or night sweats.  He has continued to take the Zytiga daily which he tolerates well, with plans to discontinue prior to start of his new systemic chemotherapy.   ALLERGIES:  is allergic to statins.  Meds: Current Outpatient Medications  Medication Sig Dispense Refill   Calcium Carb-Cholecalciferol (CALCIUM 1000 + D PO) Take 30 mLs by mouth daily. Calcium 1000 mg / Vit D  1000 iu     cycloSPORINE (RESTASIS) 0.05 % ophthalmic emulsion Place 1 drop into both eyes 2 (two) times daily.     docusate sodium (COLACE) 100 MG capsule Take 100 mg by mouth 2 (two) times daily.     ezetimibe (ZETIA) 10 MG tablet Take 10 mg by mouth every morning.      ibuprofen (ADVIL,MOTRIN) 200 MG tablet Take 200 mg by mouth every 6 (six) hours as needed for moderate pain.     leuprolide (LUPRON) 30 MG injection Inject 45 mg into the muscle every 6 (six) months.     lidocaine-prilocaine (EMLA) cream Apply 1 application topically as needed. 30 g 0   ondansetron (ZOFRAN) 4 MG tablet Take 4 mg by mouth every 8 (eight) hours as needed for nausea or vomiting.     oxyCODONE (OXY IR/ROXICODONE) 5 MG immediate release tablet Take 1-2 tablets (5-10 mg total) by mouth every 4 (four) hours as needed for severe pain. 90 tablet 0   pantoprazole (PROTONIX) 40 MG tablet Take 40 mg by mouth daily after supper.      polyethylene glycol (MIRALAX / GLYCOLAX) packet Take 17 g by mouth daily as needed for mild constipation. 14 each 0   predniSONE (DELTASONE) 5 MG tablet TAKE 1 TABLET BY MOUTH 2 TIMES DAILY WITH A MEAL. 60 tablet 2   prochlorperazine (COMPAZINE) 10 MG tablet Take 1 tablet (10 mg total) by mouth every 6 (six) hours as needed for nausea or vomiting. 30 tablet 0   tamsulosin (FLOMAX) 0.4 MG CAPS capsule Take 0.4 mg by mouth daily.      traMADol (ULTRAM) 50 MG tablet TAKE 1 TABLET BY MOUTH EVERY 6 HOURS AS NEEDED FOR PAIN 60 tablet 0   triamcinolone cream (KENALOG) 0.1 % Apply 1 application topically daily as needed (dry skin in the winter).      ZYTIGA 250 MG tablet TAKE 4 TABLETS (1,000 MG TOTAL) BY MOUTH DAILY. TAKE ON AN EMPTY STOMACH 1 HOUR BEFORE OR 2 HOURS AFTER A MEAL 120 tablet 0   No current facility-administered medications for this visit.    Facility-Administered Medications Ordered in Other Visits  Medication Dose Route Frequency Provider Last Rate Last Dose   0.9 %   sodium chloride infusion   Intravenous Continuous Allred, Darrell K, PA-C   Stopped at 05/01/19 1054   lidocaine (XYLOCAINE) 1 % (with pres) injection            lidocaine-EPINEPHrine (XYLOCAINE W/EPI) 1 %-1:100000 (with pres) injection             Physical Findings:  vitals were not taken for this visit.  Unable to assess due to telehealth visit format.  Lab Findings: Lab Results  Component Value Date  WBC 6.7 05/01/2019   HGB 12.8 (L) 05/01/2019   HCT 39.3 05/01/2019   MCV 94.7 05/01/2019   PLT 316 05/01/2019     Radiographic Findings: Ct Chest W Contrast  Result Date: 04/23/2019 CLINICAL DATA:  Follow-up metastatic prostate cancer EXAM: CT CHEST, ABDOMEN, AND PELVIS WITH CONTRAST TECHNIQUE: Multidetector CT imaging of the chest, abdomen and pelvis was performed following the standard protocol during bolus administration of intravenous contrast. CONTRAST:  OMNIPAQUE IOHEXOL 300 MG/ML SOLN, additional oral enteric contrast COMPARISON:  07/23/2018, MR lumbar spine, 01/25/2019 FINDINGS: CT CHEST FINDINGS Cardiovascular: Scattered coronary artery calcifications. Normal heart size. No pericardial effusion. Mediastinum/Nodes: There are newly enlarged mediastinal and right hilar lymph nodes, measuring up to 1.4 cm in short axis (series 3, image 27). Thyroid gland, trachea, and esophagus demonstrate no significant findings. Lungs/Pleura: Tiny centrilobular nodules, likely sequelae of infection or inflammation such as smoking-related respiratory bronchiolitis. Scattered ground-glass opacities of the right upper lobe (series 8, image 63). No pleural effusion or pneumothorax. Musculoskeletal: No chest wall mass or suspicious bone lesions identified. CT ABDOMEN PELVIS FINDINGS Hepatobiliary: There is a new hypodense lesion of the right lobe of the liver measuring 6.0 x 5.3 cm (series 3, image 55). No gallstones, gallbladder wall thickening, or biliary dilatation. Pancreas: Unremarkable. No  pancreatic ductal dilatation or surrounding inflammatory changes. Spleen: Normal in size without focal abnormality. Adrenals/Urinary Tract: Interval enlargement of a left adrenal mass, measuring 6.0 x 4.4 cm, previously 3.4 cm (series 3, image 56). Large exophytic cyst of the left kidney. No calculi or hydronephrosis. Bladder is unremarkable. Stomach/Bowel: Stomach is within normal limits. Appendix appears normal. No evidence of bowel wall thickening, distention, or inflammatory changes. Vascular/Lymphatic: Mixed calcific atherosclerosis. There are numerous new and enlarged bulky retroperitoneal and iliac lymph nodes, the largest matted lymph nodes of the right iliac chain measuring at least 2.5 cm (series 3, image 85). Reproductive: Evaluation of the pelvis is limited by bilateral hip total arthroplasty and associated streak artifact. Brachytherapy pellets are present prostate. Other: No abdominal wall hernia or abnormality. No abdominopelvic ascites. Musculoskeletal: Redemonstrated vertebral body fracture deformities of L3 and L5. Increased sclerosis of an osseous metastasis of the left ischial ramus (series 3, image 127). IMPRESSION: 1. There are newly enlarged mediastinal and right hilar lymph nodes, a new hypodense lesion of the right lobe of the liver, interval enlargement of a left adrenal mass, and numerous new and enlarged bulky retroperitoneal and iliac lymph nodes, and increased sclerosis of an osseous metastasis of the left ischial ramus (series 3, image 127). findings consistent with worsening metastatic disease. 2. Scattered nonspecific infectious or inflammatory ground-glass opacities of the right upper lobe (series 8, image 63). Attention on follow-up. 3.  Chronic and incidental findings as detailed above. Electronically Signed   By: Lauralyn Primes M.D.   On: 04/23/2019 16:09   Mr Lumbar Spine W Wo Contrast  Result Date: 04/26/2019 CLINICAL DATA:  Metastatic prostate cancer. Decompressive  laminectomy in tumor resection at L3 in August of last year. Stereotactic radio surgery of metastatic disease at L1 and L3 completed in November of last year. Difficulty walking and balance disturbance. EXAM: MRI LUMBAR SPINE WITHOUT AND WITH CONTRAST TECHNIQUE: Multiplanar and multiecho pulse sequences of the lumbar spine were obtained without and with intravenous contrast. CONTRAST:  18mL MULTIHANCE GADOBENATE DIMEGLUMINE 529 MG/ML IV SOLN COMPARISON:  01/25/2019.  CT 04/23/2019. FINDINGS: Segmentation: 5 lumbar type vertebral bodies as numbered previously. Alignment:  Straightening of the normal lumbar lordosis. Vertebrae:  Inferior T11 and T12 remain negative. Treated metastatic lesion within the right posterior corner of the L1 vertebral body with early extension into the right pedicle measures 1 mm larger than on the previous exam. Tumor shows minimal involvement of the superior foramen on the right without apparent compression of the right L1 nerve. No tumor seen within the L2 vertebral body. Pathologic fracture of L3 appears the same. Loss of height of 80% centrally. 1 cm of posterior bulging of the posterosuperior margin of the vertebral body. Previous posterior decompression at this level. New regional marrow space lesion in the anterior portion of the L4 vertebral body, measuring up to 2.5 cm in size. This is consistent with a new and rapidly progressive metastatic lesion. L5 shows an old benign superior endplate fracture. No sign of tumor affecting the L5 vertebral body. S1 and S2 are negative for tumor. Conus medullaris and cauda equina: Conus extends to the L1 level. Conus and cauda equina appear normal. Paraspinal and other soft tissues: Progressive retroperitoneal adenopathy. Retroperitoneal lymph node anterior to the IVC at the L4 level now measures 2.9 cm in length compared with 1.8 cm in February. Disc levels: L1-2: Chronic disc bulge without compressive stenosis. L2-3: Previous posterior  decompression on the left. 1 cm posterior bulging of the posterosuperior margin of the L3 vertebral body as noted above. This indents the thecal sac. Narrowing of the right lateral recess without distinct neural compression. L3-4: Bulging of the disc. Facet and ligamentous hypertrophy. Mild stenosis of both lateral recesses and foramina. L4-5: Bulging of the disc. Mild facet and ligamentous hypertrophy. Mild bilateral lateral recess and foraminal narrowing without distinct neural compression. L5-S1: No disc abnormality. Minimal facet hypertrophy. No stenosis. IMPRESSION: Treated lesion in the right posterior L1 vertebral body and pedicle is 1 mm larger, nearly stable. Very early involvement of the superior foramen on the right without gross compression of the exiting right L1 nerve. Treated pathologic fracture at L3 without evidence of progressive disease. Previous posterior decompression on the left. 1 cm posterior bulging of the posterosuperior margin of the L3 vertebral body is unchanged. New metastatic lesion within the anterior L4 vertebral body measuring up to 2.5 cm in diameter. Stable benign superior endplate compression fracture at L5. Considerable progression of retroperitoneal lymphadenopathy since February. Electronically Signed   By: Paulina Fusi M.D.   On: 04/26/2019 11:05   Ct Abdomen Pelvis W Contrast  Result Date: 04/23/2019 CLINICAL DATA:  Follow-up metastatic prostate cancer EXAM: CT CHEST, ABDOMEN, AND PELVIS WITH CONTRAST TECHNIQUE: Multidetector CT imaging of the chest, abdomen and pelvis was performed following the standard protocol during bolus administration of intravenous contrast. CONTRAST:  OMNIPAQUE IOHEXOL 300 MG/ML SOLN, additional oral enteric contrast COMPARISON:  07/23/2018, MR lumbar spine, 01/25/2019 FINDINGS: CT CHEST FINDINGS Cardiovascular: Scattered coronary artery calcifications. Normal heart size. No pericardial effusion. Mediastinum/Nodes: There are newly enlarged  mediastinal and right hilar lymph nodes, measuring up to 1.4 cm in short axis (series 3, image 27). Thyroid gland, trachea, and esophagus demonstrate no significant findings. Lungs/Pleura: Tiny centrilobular nodules, likely sequelae of infection or inflammation such as smoking-related respiratory bronchiolitis. Scattered ground-glass opacities of the right upper lobe (series 8, image 63). No pleural effusion or pneumothorax. Musculoskeletal: No chest wall mass or suspicious bone lesions identified. CT ABDOMEN PELVIS FINDINGS Hepatobiliary: There is a new hypodense lesion of the right lobe of the liver measuring 6.0 x 5.3 cm (series 3, image 55). No gallstones, gallbladder wall thickening, or biliary dilatation. Pancreas: Unremarkable.  No pancreatic ductal dilatation or surrounding inflammatory changes. Spleen: Normal in size without focal abnormality. Adrenals/Urinary Tract: Interval enlargement of a left adrenal mass, measuring 6.0 x 4.4 cm, previously 3.4 cm (series 3, image 56). Large exophytic cyst of the left kidney. No calculi or hydronephrosis. Bladder is unremarkable. Stomach/Bowel: Stomach is within normal limits. Appendix appears normal. No evidence of bowel wall thickening, distention, or inflammatory changes. Vascular/Lymphatic: Mixed calcific atherosclerosis. There are numerous new and enlarged bulky retroperitoneal and iliac lymph nodes, the largest matted lymph nodes of the right iliac chain measuring at least 2.5 cm (series 3, image 85). Reproductive: Evaluation of the pelvis is limited by bilateral hip total arthroplasty and associated streak artifact. Brachytherapy pellets are present prostate. Other: No abdominal wall hernia or abnormality. No abdominopelvic ascites. Musculoskeletal: Redemonstrated vertebral body fracture deformities of L3 and L5. Increased sclerosis of an osseous metastasis of the left ischial ramus (series 3, image 127). IMPRESSION: 1. There are newly enlarged mediastinal and  right hilar lymph nodes, a new hypodense lesion of the right lobe of the liver, interval enlargement of a left adrenal mass, and numerous new and enlarged bulky retroperitoneal and iliac lymph nodes, and increased sclerosis of an osseous metastasis of the left ischial ramus (series 3, image 127). findings consistent with worsening metastatic disease. 2. Scattered nonspecific infectious or inflammatory ground-glass opacities of the right upper lobe (series 8, image 63). Attention on follow-up. 3.  Chronic and incidental findings as detailed above. Electronically Signed   By: Lauralyn Primes M.D.   On: 04/23/2019 16:09   Ir Imaging Guided Port Insertion  Result Date: 05/01/2019 INDICATION: 78 year old with prostate cancer.  Port-A-Cath needed for therapy. EXAM: FLUOROSCOPIC AND ULTRASOUND GUIDED PLACEMENT OF A SUBCUTANEOUS PORT COMPARISON:  None. MEDICATIONS: Ancef 2 g; The antibiotic was administered within an appropriate time interval prior to skin puncture. ANESTHESIA/SEDATION: Versed 2.0 mg IV; Fentanyl 100 mcg IV; Moderate Sedation Time:  26 minutes The patient was continuously monitored during the procedure by the interventional radiology nurse under my direct supervision. FLUOROSCOPY TIME:  18 seconds, 3 mGy COMPLICATIONS: None immediate. PROCEDURE: The procedure, risks, benefits, and alternatives were explained to the patient. Questions regarding the procedure were encouraged and answered. The patient understands and consents to the procedure. Patient was placed supine on the interventional table. Ultrasound confirmed a patent right internal jugular vein. The right chest and neck were cleaned with a skin antiseptic and a sterile drape was placed. Maximal barrier sterile technique was utilized including caps, mask, sterile gowns, sterile gloves, sterile drape, hand hygiene and skin antiseptic. The right neck was anesthetized with 1% lidocaine. Small incision was made in the right neck with a blade.  Micropuncture set was placed in the right internal jugular vein with ultrasound guidance. The micropuncture wire was used for measurement purposes. The right chest was anesthetized with 1% lidocaine with epinephrine. #15 blade was used to make an incision and a subcutaneous port pocket was formed. 8 french Power Port was assembled. Subcutaneous tunnel was formed with a stiff tunneling device. The port catheter was brought through the subcutaneous tunnel. The port was placed in the subcutaneous pocket. The micropuncture set was exchanged for a peel-away sheath. The catheter was placed through the peel-away sheath and the tip was positioned at the SVC and right atrium junction. Catheter placement was confirmed with fluoroscopy. The port was accessed and flushed with heparinized saline. The port pocket was closed using two layers of absorbable sutures and Dermabond. The vein skin site  was closed using a single layer of absorbable suture and Dermabond. Sterile dressings were applied. Patient tolerated the procedure well without an immediate complication. Ultrasound and fluoroscopic images were taken and saved for this procedure. IMPRESSION: Placement of a subcutaneous port device. Catheter tip at the SVC and right atrium junction. Electronically Signed   By: Richarda Overlie M.D.   On: 05/01/2019 10:16    Impression/Plan: 1. 78 yo man with new spinal metastasis in L4 secondary to stage IV metastatic prostate adenocarcinoma.  This visit was conducted via telephone to spare the patient unnecessary potential exposure in the healthcare setting during the current COVID-19 pandemic. He has previously tolerated radiation treatments very well and has had excellent response to treatment with significant improvement in his back pain.  His recent lumbar MRI was reviewed at the multidisciplinary spine conference on 04/29/19 and recommendations are to proceed with a single fraction SRS to the new metastatic lesion at L4 which appears  to be growing rapidly. Today, I talked to the patient and his wife about the findings and workup thus far and discussed the recommendations from tumor board. We discussed the natural history of metastatic prostate carcinoma and general treatment, highlighting the role of palliative radiotherapy in the management. We discussed the available radiation techniques, and focused on the details of logistics and delivery as it pertains to stereotactic radiosurgery [facility]). We reviewed the anticipated acute and late sequelae associated with radiation in this setting. He has tolerated this well previously.  The patient was encouraged to ask questions that were answered to his stated satisfaction.  At the conclusion of our discussion, the patient elects to proceed with a single fraction of SRS to the new metastatic lesion at L4.  He is tentatively scheduled for [facility] on Friday 05/03/19 at 10am for treatment planning.  His treatment will be coordinated with his neurosurgeon, Dr. Wynetta Emery and we will contact him once a treatment date has been confirmed.  He appears to have a good understanding of his disease and our treatment recommendations which are of palliative intent.  He will also continue under the care of Dr. Clelia Croft and Dr. Earlene Plater for management of his systemic disease. I will share this discussion with Dr. Clelia Croft in case the start of systemic chemotherapy needs to be postponed until after his radiation has been completed. The patient understands that any changes to the current treatment plan will be at the discretion of Dr. Clelia Croft.  We will move forward with treatment planning accordingly.     Given current concerns for patient exposure during the COVID-19 pandemic, this encounter was conducted via telephone. The patient was notified in advance and was offered a WebEX meeting to allow for face to face communication but unfortunately reported that he did not have the appropriate resources/technology to support such a  visit and instead preferred to proceed with telephone consult. The patient has given verbal consent for this type of encounter. The time spent during this encounter was 25 minutes. The attendants for this meeting include Briar Witherspoon PA-C,  patient, Brelyn Gorrie and his wife ad daughter. During the encounter, Virna Livengood PA-C was located at [facility] Radiation Oncology Department.  Patient, Oluwaferanmi Schulenburg and family were located at home.    Marguarite Arbour, PA-C

## 2019-05-03 ENCOUNTER — Ambulatory Visit
Admission: RE | Admit: 2019-05-03 | Discharge: 2019-05-03 | Disposition: A | Discharge status: Home / Self Care | Payer: Medicare Other | Source: Ambulatory Visit | Attending: Radiation Oncology | Admitting: Radiation Oncology

## 2019-05-03 ENCOUNTER — Other Ambulatory Visit: Payer: Self-pay

## 2019-05-03 ENCOUNTER — Encounter: Payer: Self-pay | Admitting: Urology

## 2019-05-03 DIAGNOSIS — C7951 Secondary malignant neoplasm of bone: Secondary | ICD-10-CM | POA: Diagnosis not present

## 2019-05-03 DIAGNOSIS — C61 Malignant neoplasm of prostate: Secondary | ICD-10-CM | POA: Diagnosis not present

## 2019-05-03 DIAGNOSIS — Z510 Encounter for antineoplastic radiation therapy: Secondary | ICD-10-CM | POA: Diagnosis not present

## 2019-05-03 DIAGNOSIS — C7949 Secondary malignant neoplasm of other parts of nervous system: Secondary | ICD-10-CM

## 2019-05-03 NOTE — Progress Notes (Signed)
Per communication with Dr. [name], there is no need to postpone start of systemic therapy unless there is a scheduling conflict.  Otherwise, proceed as planned.  Nicholos Johns, MMS, PA-C [facility] [place]: [phone]  Fax: [phone]

## 2019-05-03 NOTE — Progress Notes (Signed)
  Radiation Oncology         [phone] ________________________________  Name: [name] MRN: [id number]  Date: 05/03/2019  DOB: [birth year]  SIMULATION AND TREATMENT PLANNING NOTE    ICD-10-CM   1. 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  NARRATIVE:  The patient was brought to the [facility].  Identity was confirmed.  All relevant records and images related to the planned course of therapy were reviewed.  The patient freely provided informed written consent to proceed with treatment after reviewing the details related to the planned course of therapy. The consent form was witnessed and verified by the simulation staff. Intravenous access was established for contrast administration. Then, the patient was set-up in a stable reproducible supine position for radiation therapy.  A relocatable thermoplastic stereotactic head frame was fabricated for precise immobilization.  CT images were obtained.  Surface markings were placed.  The CT images were loaded into the planning software and fused with the patient's targeting MRI scan.  Then the target and avoidance structures were contoured.  Treatment planning then occurred.  The radiation prescription was entered and confirmed.  I have requested 3D planning  I have requested a DVH of the following structures: Brain stem, brain, left eye, right eye, lenses, optic chiasm, target volumes, uninvolved brain, and normal tissue.    SPECIAL TREATMENT PROCEDURE:  The planned course of therapy using radiation constitutes a special treatment procedure. Special care is required in the management of this patient for the following reasons. This treatment constitutes a Special Treatment Procedure for the following reason: High dose per fraction requiring special monitoring for increased toxicities of treatment including daily imaging.   The special nature of the planned course of radiotherapy will require increased physician supervision and oversight to ensure patient's safety with optimal treatment outcomes.  PLAN:  The patient will receive 18 Gy in 1 fraction.  ________________________________  [name], M.D.  This document serves as a record of services personally performed by Tyler Pita, MD. It was created on his behalf by Wilburn Mylar, a trained medical scribe. The creation of this record is based on the scribe's personal observations and the provider's statements to them. This document has been checked and approved by the attending provider.

## 2019-05-06 DIAGNOSIS — C7951 Secondary malignant neoplasm of bone: Secondary | ICD-10-CM | POA: Diagnosis not present

## 2019-05-06 DIAGNOSIS — Z510 Encounter for antineoplastic radiation therapy: Secondary | ICD-10-CM | POA: Diagnosis not present

## 2019-05-06 DIAGNOSIS — C61 Malignant neoplasm of prostate: Secondary | ICD-10-CM | POA: Diagnosis not present

## 2019-05-07 ENCOUNTER — Other Ambulatory Visit: Payer: Self-pay | Admitting: Oncology

## 2019-05-09 ENCOUNTER — Other Ambulatory Visit: Payer: Medicare Other

## 2019-05-10 ENCOUNTER — Inpatient Hospital Stay: Payer: Medicare Other

## 2019-05-10 ENCOUNTER — Other Ambulatory Visit: Payer: Self-pay

## 2019-05-10 ENCOUNTER — Ambulatory Visit
Admission: RE | Admit: 2019-05-10 | Discharge: 2019-05-10 | Disposition: A | Discharge status: Home / Self Care | Payer: Medicare Other | Source: Ambulatory Visit | Attending: Radiation Oncology | Admitting: Radiation Oncology

## 2019-05-10 VITALS — BP 148/88 | HR 65 | Temp 98.3°F | Resp 17 | Ht 71.0 in | Wt 187.5 lb

## 2019-05-10 DIAGNOSIS — C61 Malignant neoplasm of prostate: Secondary | ICD-10-CM

## 2019-05-10 DIAGNOSIS — C7949 Secondary malignant neoplasm of other parts of nervous system: Secondary | ICD-10-CM

## 2019-05-10 DIAGNOSIS — Z79818 Long term (current) use of other agents affecting estrogen receptors and estrogen levels: Secondary | ICD-10-CM | POA: Diagnosis not present

## 2019-05-10 DIAGNOSIS — Z5111 Encounter for antineoplastic chemotherapy: Secondary | ICD-10-CM | POA: Diagnosis not present

## 2019-05-10 DIAGNOSIS — C7970 Secondary malignant neoplasm of unspecified adrenal gland: Secondary | ICD-10-CM | POA: Diagnosis not present

## 2019-05-10 DIAGNOSIS — Z7689 Persons encountering health services in other specified circumstances: Secondary | ICD-10-CM | POA: Diagnosis not present

## 2019-05-10 DIAGNOSIS — Z95828 Presence of other vascular implants and grafts: Secondary | ICD-10-CM

## 2019-05-10 DIAGNOSIS — Z510 Encounter for antineoplastic radiation therapy: Secondary | ICD-10-CM | POA: Diagnosis not present

## 2019-05-10 DIAGNOSIS — C7951 Secondary malignant neoplasm of bone: Secondary | ICD-10-CM | POA: Diagnosis not present

## 2019-05-10 LAB — CMP (CANCER CENTER ONLY)
ALT: 9 U/L (ref 0–44)
AST: 21 U/L (ref 15–41)
Albumin: 3 g/dL — ABNORMAL LOW (ref 3.5–5.0)
Alkaline Phosphatase: 135 U/L — ABNORMAL HIGH (ref 38–126)
Anion gap: 8 (ref 5–15)
BUN: 15 mg/dL (ref 8–23)
CO2: 23 mmol/L (ref 22–32)
Calcium: 8.6 mg/dL — ABNORMAL LOW (ref 8.9–10.3)
Chloride: 106 mmol/L (ref 98–111)
Creatinine: 0.82 mg/dL (ref 0.61–1.24)
GFR, Est AFR Am: >60 mL/min (ref >60)
GFR, Estimated: >60 mL/min (ref >60)
Glucose, Bld: 97 mg/dL (ref 70–99)
Potassium: 4 mmol/L (ref 3.5–5.1)
Sodium: 137 mmol/L (ref 135–145)
Total Bilirubin: 0.3 mg/dL (ref 0.3–1.2)
Total Protein: 6.1 g/dL — ABNORMAL LOW (ref 6.5–8.1)

## 2019-05-10 LAB — CBC WITH DIFFERENTIAL (CANCER CENTER ONLY)
Abs Immature Granulocytes: 0.03 10*3/uL (ref 0.00–0.07)
Basophils Absolute: 0 10*3/uL (ref 0.0–0.1)
Basophils Relative: 1 %
Eosinophils Absolute: 0.1 10*3/uL (ref 0.0–0.5)
Eosinophils Relative: 1 %
HCT: 35.7 % — ABNORMAL LOW (ref 39.0–52.0)
Hemoglobin: 11.5 g/dL — ABNORMAL LOW (ref 13.0–17.0)
Immature Granulocytes: 1 %
Lymphocytes Relative: 9 %
Lymphs Abs: 0.6 10*3/uL — ABNORMAL LOW (ref 0.7–4.0)
MCH: 30 pg (ref 26.0–34.0)
MCHC: 32.2 g/dL (ref 30.0–36.0)
MCV: 93.2 fL (ref 80.0–100.0)
Monocytes Absolute: 0.7 10*3/uL (ref 0.1–1.0)
Monocytes Relative: 10 %
Neutro Abs: 5.1 10*3/uL (ref 1.7–7.7)
Neutrophils Relative %: 78 %
Platelet Count: 282 10*3/uL (ref 150–400)
RBC: 3.83 MIL/uL — ABNORMAL LOW (ref 4.22–5.81)
RDW: 14.7 % (ref 11.5–15.5)
WBC Count: 6.5 10*3/uL (ref 4.0–10.5)
nRBC: 0 % (ref 0.0–0.2)

## 2019-05-10 MED ORDER — SODIUM CHLORIDE 0.9 % IV SOLN
20.0000 mg/m2 | Freq: Once | INTRAVENOUS | Status: AC
  Administered 2019-05-10: 10:00:00 42 mg via INTRAVENOUS
  Filled 2019-05-10: qty 4.2

## 2019-05-10 MED ORDER — DIPHENHYDRAMINE HCL 50 MG/ML IJ SOLN
25.0000 mg | Freq: Once | INTRAMUSCULAR | Status: AC
  Administered 2019-05-10: 09:00:00 25 mg via INTRAVENOUS

## 2019-05-10 MED ORDER — FAMOTIDINE IN NACL 20-0.9 MG/50ML-% IV SOLN
20.0000 mg | Freq: Once | INTRAVENOUS | Status: AC
  Administered 2019-05-10: 10:00:00 20 mg via INTRAVENOUS

## 2019-05-10 MED ORDER — SODIUM CHLORIDE 0.9% FLUSH
10.0000 mL | INTRAVENOUS | Status: DC | PRN
  Administered 2019-05-10: 12:00:00 10 mL
  Filled 2019-05-10: qty 10

## 2019-05-10 MED ORDER — PEGFILGRASTIM 6 MG/0.6ML ~~LOC~~ PSKT
6.0000 mg | PREFILLED_SYRINGE | Freq: Once | SUBCUTANEOUS | Status: AC
  Administered 2019-05-10: 14:00:00 6 mg via SUBCUTANEOUS

## 2019-05-10 MED ORDER — SODIUM CHLORIDE 0.9 % IV SOLN
Freq: Once | INTRAVENOUS | Status: AC
  Administered 2019-05-10: 09:00:00 via INTRAVENOUS
  Filled 2019-05-10: qty 250

## 2019-05-10 MED ORDER — DIPHENHYDRAMINE HCL 25 MG PO CAPS
ORAL_CAPSULE | ORAL | Status: AC
  Filled 2019-05-10: qty 1

## 2019-05-10 MED ORDER — DIPHENHYDRAMINE HCL 50 MG/ML IJ SOLN
INTRAMUSCULAR | Status: AC
  Filled 2019-05-10: qty 1

## 2019-05-10 MED ORDER — DENOSUMAB 120 MG/1.7ML ~~LOC~~ SOLN
SUBCUTANEOUS | Status: AC
  Filled 2019-05-10: qty 1.7

## 2019-05-10 MED ORDER — FAMOTIDINE IN NACL 20-0.9 MG/50ML-% IV SOLN
INTRAVENOUS | Status: AC
  Filled 2019-05-10: qty 50

## 2019-05-10 MED ORDER — DENOSUMAB 120 MG/1.7ML ~~LOC~~ SOLN
120.0000 mg | Freq: Once | SUBCUTANEOUS | Status: AC
  Administered 2019-05-10: 12:00:00 120 mg via SUBCUTANEOUS

## 2019-05-10 MED ORDER — SODIUM CHLORIDE 0.9% FLUSH
10.0000 mL | INTRAVENOUS | Status: DC | PRN
  Administered 2019-05-10: 10 mL via INTRAVENOUS
  Filled 2019-05-10: qty 10

## 2019-05-10 MED ORDER — PEGFILGRASTIM 6 MG/0.6ML ~~LOC~~ PSKT
PREFILLED_SYRINGE | SUBCUTANEOUS | Status: AC
  Filled 2019-05-10: qty 0.6

## 2019-05-10 MED ORDER — SODIUM CHLORIDE 0.9 % IV SOLN: 10.0000 mg | Freq: Once | INTRAVENOUS | Status: DC

## 2019-05-10 MED ORDER — DEXAMETHASONE SODIUM PHOSPHATE 10 MG/ML IJ SOLN
INTRAMUSCULAR | Status: AC
  Filled 2019-05-10: qty 1

## 2019-05-10 MED ORDER — HEPARIN SOD (PORK) LOCK FLUSH 100 UNIT/ML IV SOLN
500.0000 [IU] | Freq: Once | INTRAVENOUS | Status: AC | PRN
  Administered 2019-05-10: 12:00:00 500 [IU]
  Filled 2019-05-10: qty 5

## 2019-05-10 MED ORDER — DEXAMETHASONE SODIUM PHOSPHATE 10 MG/ML IJ SOLN
10.0000 mg | Freq: Once | INTRAMUSCULAR | Status: AC
  Administered 2019-05-10: 10 mg via INTRAVENOUS

## 2019-05-10 NOTE — Progress Notes (Signed)
Saw pt in infusion room & checked to see if he had any questions regarding education.  He states he is OK & appreciated Ed session on 5/12.

## 2019-05-10 NOTE — Patient Instructions (Addendum)
[facility] Discharge Instructions for Patients Receiving Chemotherapy  Today you received the following chemotherapy agents [name].  To help prevent nausea and vomiting after your treatment, we encourage you to take your nausea medication as directed.  If you develop nausea and vomiting that is not controlled by your nausea medication, call the clinic.   BELOW ARE SYMPTOMS THAT SHOULD BE REPORTED IMMEDIATELY:  *FEVER GREATER THAN 100.5 F  *CHILLS WITH OR WITHOUT FEVER  NAUSEA AND VOMITING THAT IS NOT CONTROLLED WITH YOUR NAUSEA MEDICATION  *UNUSUAL SHORTNESS OF BREATH  *UNUSUAL BRUISING OR BLEEDING  TENDERNESS IN MOUTH AND THROAT WITH OR WITHOUT PRESENCE OF ULCERS  *URINARY PROBLEMS  *BOWEL PROBLEMS  UNUSUAL RASH Items with * indicate a potential emergency and should be followed up as soon as possible.  Feel free to call the clinic should you have any questions or concerns. The clinic phone number is [phone].  Please show the [place] at check-in to the Emergency Department and triage nurse.  Cabazitaxel injection What is this medicine? CABAZITAXEL (ka baz i TAX el) is a chemotherapy drug. It is used to treat prostate cancer. It targets fast dividing cells, like cancer cells, and causes these cells to die. This medicine may be used for other purposes; ask your health care provider or pharmacist if you have questions. COMMON BRAND NAME(S): [name] What should I tell my health care provider before I take this medicine? They need to know if you have any of these conditions: -history of stomach bleeding -kidney disease -liver disease -low blood counts, like low white cell, platelet, or red cell counts -lung or breathing disease, like asthma -recent or ongoing radiation therapy -take medicines that treat or prevent blood clots -an unusual or allergic reaction to cabazitaxel, polysorbate 80, other medicines, foods, dyes, or  preservatives -pregnant or trying to get pregnant -breast-feeding How should I use this medicine? This medicine is for infusion into a vein. It is given by a health care professional in a hospital or clinic setting. Talk to your pediatrician regarding the use of this medicine in children. Special care may be needed. Overdosage: If you think you have taken too much of this medicine contact a poison control center or emergency room at once. NOTE: This medicine is only for you. Do not share this medicine with others. What if I miss a dose? It is important not to miss your dose. Call your doctor or health care professional if you are unable to keep an appointment. What may interact with this medicine? -antiviral medicines for HIV or AIDS -clarithromycin -medicines for fungal infections like ketoconazole, fluconazole, itraconazole, and voriconazole -nefazodone -telithromycin This list may not describe all possible interactions. Give your health care provider a list of all the medicines, herbs, non-prescription drugs, or dietary supplements you use. Also tell them if you smoke, drink alcohol, or use illegal drugs. Some items may interact with your medicine. What should I watch for while using this medicine? Your condition will be monitored carefully while you are receiving this medicine. This drug may make you feel generally unwell. This is not uncommon, as chemotherapy can affect healthy cells as well as cancer cells. Report any side effects. Continue your course of treatment even though you feel ill unless your doctor tells you to stop. Call your doctor or health care professional for advice if you get a fever, chills or sore throat, or other symptoms of a cold or flu. Do not treat yourself. This drug  decreases your body's ability to fight infections. Try to avoid being around people who are sick. This medicine may increase your risk to bruise or bleed. Call your doctor or health care professional  if you notice any unusual bleeding. Be careful brushing and flossing your teeth or using a toothpick because you may get an infection or bleed more easily. If you have any dental work done, tell your dentist you are receiving this medicine. Avoid taking products that contain aspirin, acetaminophen, ibuprofen, naproxen, or ketoprofen unless instructed by your doctor. These medicines may hide a fever. Do not become pregnant while taking this medicine. Women should inform their doctor if they wish to become pregnant or think they might be pregnant. Men should not father a child while taking this medicine and for 3 months after stopping it. There is a potential for serious side effects to an unborn child. Talk to your health care professional or pharmacist for more information. Do not breast-feed an infant while taking this medicine. What side effects may I notice from receiving this medicine? Side effects that you should report to your doctor or health care professional as soon as possible: -allergic reactions like skin rash, itching or hives, swelling of the face, lips, or tongue -blood in the urine -breathing problems -constipation -dark urine -diarrhea -pain in the lower back or side -pain, tingling, numbness in the hands or feet -pain when urinating -severe abdominal pain -signs of infection - fever or chills, cough, sore throat, pain or difficulty passing urine -signs and symptoms of kidney injury like trouble passing urine or change in the amount of urine -signs of decreased platelets or bleeding - bruising, pinpoint red spots on the skin, black, tarry stools, blood in the urine -signs of decreased red blood cells - unusually weak or tired, fainting spells, lightheadedness -vomiting Side effects that usually do not require medical attention (report to your doctor or health care professional if they continue or are bothersome): -back pain -change in taste -hair loss -headache -loss of  appetite -muscle or joint pain -nausea -upset stomach This list may not describe all possible side effects. Call your doctor for medical advice about side effects. You may report side effects to FDA at 1-800-FDA-1088. Where should I keep my medicine? This drug is given in a hospital or clinic and will not be stored at home. NOTE: This sheet is a summary. It may not cover all possible information. If you have questions about this medicine, talk to your doctor, pharmacist, or health care provider.  2019 Elsevier/Gold Standard (2016-12-30 15:38:47) Pegfilgrastim injection What is this medicine? PEGFILGRASTIM (PEG fil gra stim) is a long-acting granulocyte colony-stimulating factor that stimulates the growth of neutrophils, a type of white blood cell important in the body's fight against infection. It is used to reduce the incidence of fever and infection in patients with certain types of cancer who are receiving chemotherapy that affects the bone marrow, and to increase survival after being exposed to high doses of radiation. This medicine may be used for other purposes; ask your health care provider or pharmacist if you have questions. COMMON BRAND NAME(S): Domenic Moras, UDENYCA What should I tell my health care provider before I take this medicine? They need to know if you have any of these conditions: -kidney disease -latex allergy -ongoing radiation therapy -sickle cell disease -skin reactions to acrylic adhesives (On-Body Injector only) -an unusual or allergic reaction to pegfilgrastim, filgrastim, other medicines, foods, dyes, or preservatives -pregnant or trying to get  pregnant -breast-feeding How should I use this medicine? This medicine is for injection under the skin. If you get this medicine at home, you will be taught how to prepare and give the pre-filled syringe or how to use the On-body Injector. Refer to the patient Instructions for Use for detailed instructions. Use  exactly as directed. Tell your healthcare provider immediately if you suspect that the On-body Injector may not have performed as intended or if you suspect the use of the On-body Injector resulted in a missed or partial dose. It is important that you put your used needles and syringes in a special sharps container. Do not put them in a trash can. If you do not have a sharps container, call your pharmacist or healthcare provider to get one. Talk to your pediatrician regarding the use of this medicine in children. While this drug may be prescribed for selected conditions, precautions do apply. Overdosage: If you think you have taken too much of this medicine contact a poison control center or emergency room at once. NOTE: This medicine is only for you. Do not share this medicine with others. What if I miss a dose? It is important not to miss your dose. Call your doctor or health care professional if you miss your dose. If you miss a dose due to an On-body Injector failure or leakage, a new dose should be administered as soon as possible using a single prefilled syringe for manual use. What may interact with this medicine? Interactions have not been studied. Give your health care provider a list of all the medicines, herbs, non-prescription drugs, or dietary supplements you use. Also tell them if you smoke, drink alcohol, or use illegal drugs. Some items may interact with your medicine. This list may not describe all possible interactions. Give your health care provider a list of all the medicines, herbs, non-prescription drugs, or dietary supplements you use. Also tell them if you smoke, drink alcohol, or use illegal drugs. Some items may interact with your medicine. What should I watch for while using this medicine? You may need blood work done while you are taking this medicine. If you are going to need a MRI, CT scan, or other procedure, tell your doctor that you are using this medicine (On-Body  Injector only). What side effects may I notice from receiving this medicine? Side effects that you should report to your doctor or health care professional as soon as possible: -allergic reactions like skin rash, itching or hives, swelling of the face, lips, or tongue -back pain -dizziness -fever -pain, redness, or irritation at site where injected -pinpoint red spots on the skin -red or dark-brown urine -shortness of breath or breathing problems -stomach or side pain, or pain at the shoulder -swelling -tiredness -trouble passing urine or change in the amount of urine Side effects that usually do not require medical attention (report to your doctor or health care professional if they continue or are bothersome): -bone pain -muscle pain This list may not describe all possible side effects. Call your doctor for medical advice about side effects. You may report side effects to FDA at 1-800-FDA-1088. Where should I keep my medicine? Keep out of the reach of children. If you are using this medicine at home, you will be instructed on how to store it. Throw away any unused medicine after the expiration date on the label. NOTE: This sheet is a summary. It may not cover all possible information. If you have questions about this medicine, talk  to your doctor, pharmacist, or health care provider.  2019 Elsevier/Gold Standard (2018-03-12 16:57:08) Denosumab injection What is this medicine? DENOSUMAB (den oh sue mab) slows bone breakdown. Prolia is used to treat osteoporosis in women after menopause and in men, and in people who are taking corticosteroids for 6 months or more. Delton See is used to treat a high calcium level due to cancer and to prevent bone fractures and other bone problems caused by multiple myeloma or cancer bone metastases. Delton See is also used to treat giant cell tumor of the bone. This medicine may be used for other purposes; ask your health care provider or pharmacist if you have  questions. COMMON BRAND NAME(S): Prolia, XGEVA What should I tell my health care provider before I take this medicine? They need to know if you have any of these conditions: -dental disease -having surgery or tooth extraction -infection -kidney disease -low levels of calcium or Vitamin D in the blood -malnutrition -on hemodialysis -skin conditions or sensitivity -thyroid or parathyroid disease -an unusual reaction to denosumab, other medicines, foods, dyes, or preservatives -pregnant or trying to get pregnant -breast-feeding How should I use this medicine? This medicine is for injection under the skin. It is given by a health care professional in a hospital or clinic setting. A special MedGuide will be given to you before each treatment. Be sure to read this information carefully each time. For Prolia, talk to your pediatrician regarding the use of this medicine in children. Special care may be needed. For Delton See, talk to your pediatrician regarding the use of this medicine in children. While this drug may be prescribed for children as young as 13 years for selected conditions, precautions do apply. Overdosage: If you think you have taken too much of this medicine contact a poison control center or emergency room at once. NOTE: This medicine is only for you. Do not share this medicine with others. What if I miss a dose? It is important not to miss your dose. Call your doctor or health care professional if you are unable to keep an appointment. What may interact with this medicine? Do not take this medicine with any of the following medications: -other medicines containing denosumab This medicine may also interact with the following medications: -medicines that lower your chance of fighting infection -steroid medicines like prednisone or cortisone This list may not describe all possible interactions. Give your health care provider a list of all the medicines, herbs, non-prescription  drugs, or dietary supplements you use. Also tell them if you smoke, drink alcohol, or use illegal drugs. Some items may interact with your medicine. What should I watch for while using this medicine? Visit your doctor or health care professional for regular checks on your progress. Your doctor or health care professional may order blood tests and other tests to see how you are doing. Call your doctor or health care professional for advice if you get a fever, chills or sore throat, or other symptoms of a cold or flu. Do not treat yourself. This drug may decrease your body's ability to fight infection. Try to avoid being around people who are sick. You should make sure you get enough calcium and vitamin D while you are taking this medicine, unless your doctor tells you not to. Discuss the foods you eat and the vitamins you take with your health care professional. See your dentist regularly. Brush and floss your teeth as directed. Before you have any dental work done, tell your dentist you are  receiving this medicine. Do not become pregnant while taking this medicine or for 5 months after stopping it. Talk with your doctor or health care professional about your birth control options while taking this medicine. Women should inform their doctor if they wish to become pregnant or think they might be pregnant. There is a potential for serious side effects to an unborn child. Talk to your health care professional or pharmacist for more information. What side effects may I notice from receiving this medicine? Side effects that you should report to your doctor or health care professional as soon as possible: -allergic reactions like skin rash, itching or hives, swelling of the face, lips, or tongue -bone pain -breathing problems -dizziness -jaw pain, especially after dental work -redness, blistering, peeling of the skin -signs and symptoms of infection like fever or chills; cough; sore throat; pain or trouble  passing urine -signs of low calcium like fast heartbeat, muscle cramps or muscle pain; pain, tingling, numbness in the hands or feet; seizures -unusual bleeding or bruising -unusually weak or tired Side effects that usually do not require medical attention (report to your doctor or health care professional if they continue or are bothersome): -constipation -diarrhea -headache -joint pain -loss of appetite -muscle pain -runny nose -tiredness -upset stomach This list may not describe all possible side effects. Call your doctor for medical advice about side effects. You may report side effects to FDA at 1-800-FDA-1088. Where should I keep my medicine? This medicine is only given in a clinic, doctor's office, or other health care setting and will not be stored at home. NOTE: This sheet is a summary. It may not cover all possible information. If you have questions about this medicine, talk to your doctor, pharmacist, or health care provider.  2019 Elsevier/Gold Standard (2018-04-13 16:10:44)

## 2019-05-11 ENCOUNTER — Ambulatory Visit: Payer: Medicare Other

## 2019-05-11 LAB — PROSTATE-SPECIFIC AG, SERUM (LABCORP): Prostate Specific Ag, Serum: 129 ng/mL — ABNORMAL HIGH (ref 0.0–4.0)

## 2019-05-14 NOTE — Progress Notes (Signed)
  Radiation Oncology         [phone] ________________________________  Spinal Stereotactic Radiosurgery Procedure Note  Name: KAYO ZION MRN: [id number]  Date: 05/10/2019  DOB: 1941-10-06  SPECIAL TREATMENT PROCEDURE    ICD-10-CM   1. 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  It showed 3-dimensional radiation distributions overlaid onto the planning CT/MRI image set.  [facility] for the target structures as well as the organs at risk were reviewed. The documentation of the 3D plan and dosimetry are filed in the radiation oncology EMR.  NARRATIVE:  JUANMANUEL MAROHL was brought to the TrueBeam stereotactic radiation treatment machine and placed supine on the CT couch. The patient was precisely re-positioned in their BodyFix immobilization device, and the patient was set up for stereotactic radiosurgery.  Neurosurgery was present for the set-up and delivery  SIMULATION VERIFICATION:  In the couch zero-angle position, the patient underwent Exactrac imaging using the Brainlab system with orthogonal KV images to position the target accounting for translation and rotational factors.  These were carefully aligned and repeated to confirm treatment position.  Then, cone beam CT was performed to help further verify placement and make any final translational shifts.  SPECIAL TREATMENT PROCEDURE: [name] received stereotactic radiosurgery to the following targets:  The targeted metastasis in the L4 vertebral body was treated using 3 Rapid Arc VMAT Beams to a prescription dose of 18 Gy to the gross disease (GTV) seen on imaging, while a secondary lower prescription dose of 14 Gy was delivered to the entirety of each directly involved marrow compartment of the gross disease (CTV).    The beams were delivered with 6 MV X-rays  in the flattening filter free mode.  STEREOTACTIC TREATMENT MANAGEMENT:  Following delivery, the patient was transported to nursing in stable condition and monitored for possible acute effects.  Vital signs were recorded during a concurrent visit to med-onc.  The patient tolerated treatment without significant acute effects, and was discharged to home in stable condition.    PLAN: Follow-up in one month.  ________________________________  Sheral Apley. Tammi Klippel, M.D.

## 2019-05-17 ENCOUNTER — Other Ambulatory Visit: Payer: Self-pay

## 2019-05-17 ENCOUNTER — Telehealth: Payer: Self-pay | Admitting: Urology

## 2019-05-17 ENCOUNTER — Encounter (HOSPITAL_COMMUNITY): Payer: Self-pay | Admitting: *Deleted

## 2019-05-17 ENCOUNTER — Observation Stay (HOSPITAL_COMMUNITY)
Admission: EM | Admit: 2019-05-17 | Discharge: 2019-05-18 | Disposition: A | Discharge status: Home / Self Care | Payer: Medicare Other | Attending: Internal Medicine | Admitting: Internal Medicine

## 2019-05-17 ENCOUNTER — Emergency Department (HOSPITAL_COMMUNITY): Payer: Medicare Other

## 2019-05-17 ENCOUNTER — Telehealth: Payer: Self-pay

## 2019-05-17 DIAGNOSIS — C61 Malignant neoplasm of prostate: Secondary | ICD-10-CM | POA: Insufficient documentation

## 2019-05-17 DIAGNOSIS — R799 Abnormal finding of blood chemistry, unspecified: Secondary | ICD-10-CM | POA: Diagnosis not present

## 2019-05-17 DIAGNOSIS — Z87891 Personal history of nicotine dependence: Secondary | ICD-10-CM | POA: Diagnosis not present

## 2019-05-17 DIAGNOSIS — Z209 Contact with and (suspected) exposure to unspecified communicable disease: Secondary | ICD-10-CM | POA: Diagnosis not present

## 2019-05-17 DIAGNOSIS — R9431 Abnormal electrocardiogram [ECG] [EKG]: Secondary | ICD-10-CM | POA: Diagnosis not present

## 2019-05-17 DIAGNOSIS — Z79899 Other long term (current) drug therapy: Secondary | ICD-10-CM | POA: Insufficient documentation

## 2019-05-17 DIAGNOSIS — Z20828 Contact with and (suspected) exposure to other viral communicable diseases: Secondary | ICD-10-CM | POA: Insufficient documentation

## 2019-05-17 DIAGNOSIS — M7989 Other specified soft tissue disorders: Secondary | ICD-10-CM | POA: Diagnosis not present

## 2019-05-17 DIAGNOSIS — Z96643 Presence of artificial hip joint, bilateral: Secondary | ICD-10-CM | POA: Diagnosis not present

## 2019-05-17 DIAGNOSIS — R509 Fever, unspecified: Secondary | ICD-10-CM

## 2019-05-17 DIAGNOSIS — C7951 Secondary malignant neoplasm of bone: Secondary | ICD-10-CM | POA: Insufficient documentation

## 2019-05-17 DIAGNOSIS — M546 Pain in thoracic spine: Secondary | ICD-10-CM | POA: Diagnosis not present

## 2019-05-17 DIAGNOSIS — R079 Chest pain, unspecified: Secondary | ICD-10-CM

## 2019-05-17 DIAGNOSIS — I2699 Other pulmonary embolism without acute cor pulmonale: Secondary | ICD-10-CM | POA: Diagnosis not present

## 2019-05-17 DIAGNOSIS — M5489 Other dorsalgia: Secondary | ICD-10-CM | POA: Diagnosis not present

## 2019-05-17 DIAGNOSIS — R0602 Shortness of breath: Secondary | ICD-10-CM | POA: Diagnosis not present

## 2019-05-17 DIAGNOSIS — M549 Dorsalgia, unspecified: Secondary | ICD-10-CM

## 2019-05-17 LAB — URINALYSIS, ROUTINE W REFLEX MICROSCOPIC
Bilirubin Urine: NEGATIVE
Glucose, UA: NEGATIVE mg/dL
Hgb urine dipstick: NEGATIVE
Ketones, ur: NEGATIVE mg/dL
Leukocytes,Ua: NEGATIVE
Nitrite: NEGATIVE
Protein, ur: NEGATIVE mg/dL
Specific Gravity, Urine: 1.03 (ref 1.005–1.030)
pH: 7 (ref 5.0–8.0)

## 2019-05-17 LAB — CBC WITH DIFFERENTIAL/PLATELET
Abs Immature Granulocytes: 0.69 10*3/uL — ABNORMAL HIGH (ref 0.00–0.07)
Basophils Absolute: 0.1 10*3/uL (ref 0.0–0.1)
Basophils Relative: 1 %
Eosinophils Absolute: 0 10*3/uL (ref 0.0–0.5)
Eosinophils Relative: 1 %
HCT: 33.2 % — ABNORMAL LOW (ref 39.0–52.0)
Hemoglobin: 10.5 g/dL — ABNORMAL LOW (ref 13.0–17.0)
Immature Granulocytes: 17 %
Lymphocytes Relative: 11 %
Lymphs Abs: 0.4 10*3/uL — ABNORMAL LOW (ref 0.7–4.0)
MCH: 29.6 pg (ref 26.0–34.0)
MCHC: 31.6 g/dL (ref 30.0–36.0)
MCV: 93.5 fL (ref 80.0–100.0)
Monocytes Absolute: 0.6 10*3/uL (ref 0.1–1.0)
Monocytes Relative: 14 %
Neutro Abs: 2.4 10*3/uL (ref 1.7–7.7)
Neutrophils Relative %: 56 %
Platelets: 233 10*3/uL (ref 150–400)
RBC: 3.55 MIL/uL — ABNORMAL LOW (ref 4.22–5.81)
RDW: 14.7 % (ref 11.5–15.5)
WBC: 4.1 10*3/uL (ref 4.0–10.5)
nRBC: 0.5 % — ABNORMAL HIGH (ref 0.0–0.2)

## 2019-05-17 LAB — COMPREHENSIVE METABOLIC PANEL
ALT: 13 U/L (ref 0–44)
AST: 20 U/L (ref 15–41)
Albumin: 2.8 g/dL — ABNORMAL LOW (ref 3.5–5.0)
Alkaline Phosphatase: 82 U/L (ref 38–126)
Anion gap: 10 (ref 5–15)
BUN: 10 mg/dL (ref 8–23)
CO2: 23 mmol/L (ref 22–32)
Calcium: 8.6 mg/dL — ABNORMAL LOW (ref 8.9–10.3)
Chloride: 103 mmol/L (ref 98–111)
Creatinine, Ser: 0.72 mg/dL (ref 0.61–1.24)
GFR calc Af Amer: >60 mL/min (ref >60)
GFR calc non Af Amer: >60 mL/min (ref >60)
Glucose, Bld: 128 mg/dL — ABNORMAL HIGH (ref 70–99)
Potassium: 4.1 mmol/L (ref 3.5–5.1)
Sodium: 136 mmol/L (ref 135–145)
Total Bilirubin: 0.7 mg/dL (ref 0.3–1.2)
Total Protein: 5.2 g/dL — ABNORMAL LOW (ref 6.5–8.1)

## 2019-05-17 LAB — SARS CORONAVIRUS 2 BY RT PCR (HOSPITAL ORDER, PERFORMED IN ~~LOC~~ HOSPITAL LAB): SARS Coronavirus 2: NEGATIVE

## 2019-05-17 LAB — TROPONIN I: Troponin I: <0.03 ng/mL (ref <0.03)

## 2019-05-17 LAB — LACTIC ACID, PLASMA: Lactic Acid, Venous: 3.1 mmol/L (ref 0.5–1.9)

## 2019-05-17 MED ORDER — TAMSULOSIN HCL 0.4 MG PO CAPS
0.4000 mg | ORAL_CAPSULE | Freq: Every day | ORAL | Status: DC
  Administered 2019-05-18: 0.4 mg via ORAL
  Filled 2019-05-17: qty 1

## 2019-05-17 MED ORDER — ACETAMINOPHEN 650 MG RE SUPP: 650.0000 mg | Freq: Four times a day (QID) | RECTAL | Status: DC | PRN

## 2019-05-17 MED ORDER — SODIUM CHLORIDE 0.9 % IV SOLN: 2.0000 g | Freq: Three times a day (TID) | INTRAVENOUS | Status: DC

## 2019-05-17 MED ORDER — TRAMADOL HCL 50 MG PO TABS: 50.0000 mg | ORAL_TABLET | Freq: Four times a day (QID) | ORAL | Status: DC | PRN

## 2019-05-17 MED ORDER — ONDANSETRON HCL 4 MG PO TABS: 4.0000 mg | ORAL_TABLET | Freq: Four times a day (QID) | ORAL | Status: DC | PRN

## 2019-05-17 MED ORDER — ACETAMINOPHEN 325 MG PO TABS: 650.0000 mg | ORAL_TABLET | Freq: Four times a day (QID) | ORAL | Status: DC | PRN

## 2019-05-17 MED ORDER — SODIUM CHLORIDE 0.9 % IV BOLUS
1000.0000 mL | Freq: Once | INTRAVENOUS | Status: AC
  Administered 2019-05-17: 22:00:00 1000 mL via INTRAVENOUS

## 2019-05-17 MED ORDER — SODIUM CHLORIDE 0.9 % IV SOLN
2.0000 g | Freq: Once | INTRAVENOUS | Status: AC
  Administered 2019-05-17: 2 g via INTRAVENOUS
  Filled 2019-05-17: qty 2

## 2019-05-17 MED ORDER — PREDNISONE 5 MG PO TABS
5.0000 mg | ORAL_TABLET | Freq: Two times a day (BID) | ORAL | Status: DC
  Administered 2019-05-18: 5 mg via ORAL
  Filled 2019-05-17: qty 1

## 2019-05-17 MED ORDER — ONDANSETRON HCL 4 MG/2ML IJ SOLN: 4.0000 mg | Freq: Four times a day (QID) | INTRAMUSCULAR | Status: DC | PRN

## 2019-05-17 MED ORDER — HEPARIN BOLUS VIA INFUSION
5000.0000 [IU] | Freq: Once | INTRAVENOUS | Status: AC
  Administered 2019-05-17: 5000 [IU] via INTRAVENOUS
  Filled 2019-05-17: qty 5000

## 2019-05-17 MED ORDER — POLYETHYLENE GLYCOL 3350 17 G PO PACK: 17.0000 g | PACK | Freq: Every day | ORAL | Status: DC | PRN

## 2019-05-17 MED ORDER — VANCOMYCIN HCL IN DEXTROSE 1-5 GM/200ML-% IV SOLN
1000.0000 mg | Freq: Once | INTRAVENOUS | Status: DC
  Filled 2019-05-17: qty 200

## 2019-05-17 MED ORDER — PANTOPRAZOLE SODIUM 40 MG PO TBEC: 40.0000 mg | DELAYED_RELEASE_TABLET | Freq: Every day | ORAL | Status: DC

## 2019-05-17 MED ORDER — HEPARIN (PORCINE) 25000 UT/250ML-% IV SOLN
1500.0000 [IU]/h | INTRAVENOUS | Status: DC
  Administered 2019-05-17: 1500 [IU]/h via INTRAVENOUS
  Filled 2019-05-17: qty 250

## 2019-05-17 MED ORDER — VANCOMYCIN HCL 10 G IV SOLR
1500.0000 mg | Freq: Two times a day (BID) | INTRAVENOUS | Status: DC
  Administered 2019-05-17: 23:00:00 1500 mg via INTRAVENOUS
  Filled 2019-05-17 (×2): qty 1500

## 2019-05-17 MED ORDER — OXYCODONE HCL 5 MG PO TABS: 5.0000 mg | ORAL_TABLET | ORAL | Status: DC | PRN

## 2019-05-17 MED ORDER — IOHEXOL 350 MG/ML SOLN
80.0000 mL | Freq: Once | INTRAVENOUS | Status: AC | PRN
  Administered 2019-05-17: 80 mL via INTRAVENOUS

## 2019-05-17 NOTE — ED Notes (Signed)
Pt taken to CT.

## 2019-05-17 NOTE — ED Triage Notes (Signed)
Pt from home, started having neck and upper back pain, radiating into chest today with fever for the past couple of days. Pain worsens with movement. Hx of prostate and spine CA. On 5/22, pt had radiation and chemo (usually only takes radiation) with portacath placement. Last took oxycodone at 4pm; fever was 100.3. EMS gave 1 nitro with drop of bp; pt reports improvement of pain.

## 2019-05-17 NOTE — Telephone Encounter (Signed)
-----   Message from [name], MD sent at 05/17/2019  3:49 PM EDT ----- Regarding: RE: FYI Noted. Thanks.  ----- Message ----- From: Tami Lin, RN Sent: 05/17/2019   3:34 PM EDT To: [name], MD Subject: FYI                                            Patient wanted to let you know he received first dose of [name] last Friday. Last night he had a "throbbing pain" in his neck. He took two Oxycodone and felt better. Today the pain has moved to his back near the radiation area. He feels fine as long as he is sitting still but the pain gets worse when he moves around. He took two more Oxycodone today around 12 and feels slightly better.  Denies fever, nausea, and vomiting. Instructed patient to call the office if pain level increases and is not controlled with pain med.  Lanelle Bal

## 2019-05-17 NOTE — Telephone Encounter (Signed)
I spoke with the patient and his wife by phone regarding new onset neck/upper back pain which started yesterday.  He reports that initially the pain was in his neck and occasionally radiated into the left shoulderblade with activity and for a brief time after, then spontaneously resolving.  Today, the pain has started to radiate further down into the mid back region, again more noticeable with activity and for a short period of time thereafter before spontaneously resolving.  I reviewed his recent imaging and advised that I could not see any obvious cancer related cause for the pain he was describing.  He denies chest pain, pressure, numbness/tingling in the UEs, shortness of breath, dyspnea, cough, hemoptysis, fever, chills or night sweats. He denies abdominal pain, N/V or bowel issues.  I advised that if his symptoms persist or certainly if they worsen in any way or should he develop new, associated symptoms, he should present to [facility] ED for further urgent evaluation.  If symptoms persist but are manageable and not progressive, he will call medical oncology on Monday to request evaluation in the [facility].  Nicholos Johns, MMS, PA-C [place] [facility] [place]: [phone]  Fax: [phone]

## 2019-05-17 NOTE — Progress Notes (Signed)
Pharmacy Antibiotic Note  [name] is a 78 y.o. male admitted on 05/17/2019 with sepsis.  Pharmacy has been consulted for Vancomycin and Cefepime dosing.  Vancomycin 1500 mg IV Q 12 hrs. Goal AUC 400-550. Expected AUC: 539 SCr used: 0.72  Plan: Cefepime 2 grams IV q8hrs Vancomycin 1500 mg IV q12hr Monitor renal function, C&S and vanc levels as needed   Temp (24hrs), Avg:100 F (37.8 C), Min:100 F (37.8 C), Max:100 F (37.8 C)  Recent Labs  Lab 05/17/19 2043  WBC 4.1  CREATININE 0.72  LATICACIDVEN 3.1*    Estimated Creatinine Clearance: 82.4 mL/min (by C-G formula based on SCr of 0.72 mg/dL).    Allergies  Allergen Reactions  . Statins Other (See Comments)    Muscle pain    Antimicrobials this admission: Vanc 5/29 >>  Cefepime 5/29 >>   Thank you for allowing pharmacy to be a part of this patient's care.  Alanda Slim, PharmD, [facility] Clinical Pharmacist Please see AMION for all Pharmacists' Contact Phone Numbers 05/17/2019, 9:37 PM

## 2019-05-17 NOTE — ED Provider Notes (Addendum)
[facility] EMERGENCY DEPARTMENT Provider Note   CSN: [id number] Arrival date & time: 05/17/19  2008    History   Chief Complaint Chief Complaint  Patient presents with   Chest Pain    HPI [name] is a 78 y.o. male.     Patient c/o left upper back and left chest pain for the past day. Symptoms occurred at rest, acute onset, constant, persistent, at times worse w certain movement or deep breath. Pt w fever 100. +mild sob.  No cough. No known covid + exposure. Hx metastatic prostate ca, s/p chemo tx 1 week ago. Denies chills/sweats. No hx PE. No exertional chest pain.   The history is provided by the patient, the EMS personnel and a relative.  Chest Pain  Associated symptoms: fever and shortness of breath   Associated symptoms: no abdominal pain, no back pain, no cough, no headache and no vomiting     Past Medical History:  Diagnosis Date   Arthritis    Barrett's esophagus    Bone cancer ([place])    Cataract    GERD (gastroesophageal reflux disease)    takes Protonix daily   History of colon polyps    Hyperlipidemia    takes Zetia and Welchol daily   Joint pain    Muscle spasm    takes Robaxin daily as needed   Prostate cancer 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   Family history of malignant neoplasm of prostate  10/27/2014   Elevated prostate specific antigen (PSA) 10/27/2014   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    Past Surgical History:  Procedure Laterality Date   COLONOSCOPY     ESOPHAGOGASTRODUODENOSCOPY     IR IMAGING GUIDED PORT INSERTION  05/01/2019   KNEE ARTHROSCOPY Right 2011   LUMBAR LAMINECTOMY/DECOMPRESSION MICRODISCECTOMY Left 08/06/2018   Procedure: Laminectomy and Foraminotomy - Lumbar Three-Four- left;  Surgeon: Kary Kos, MD;  Location: [place] [place];  Service: Neurosurgery;  Laterality: Left;  left   TOTAL HIP ARTHROPLASTY Left 05/07/2013   Procedure: LEFT TOTAL HIP ARTHROPLASTY ANTERIOR APPROACH;  Surgeon: Mcarthur Rossetti, MD;  Location: [place];  Service: Orthopedics;  Laterality: Left;   TOTAL HIP ARTHROPLASTY Right    TOTAL HIP REVISION Right 02/02/2015   Procedure: TOTAL HIP REVISION;  Surgeon: Kerin Salen, MD;  Location: [place];  Service: Orthopedics;  Laterality: Right;        Home Medications    Prior to Admission medications   Medication Sig Start Date End Date Taking? Authorizing Provider  Calcium Carb-Cholecalciferol (CALCIUM 1000 + D PO) Take 30 mLs by mouth daily. Calcium 1000 mg / Vit D 1000 iu    [provider]  cycloSPORINE (RESTASIS) 0.05 % ophthalmic emulsion Place 1 drop into both eyes 2 (two) times daily.    [provider]  docusate sodium (COLACE) 100 MG capsule Take 100 mg by mouth 2 (two) times daily.    [provider]  ezetimibe (ZETIA) 10 MG tablet Take 10 mg by mouth every morning.     [provider]  ibuprofen (ADVIL,MOTRIN) 200 MG tablet Take 200 mg by mouth every 6 (six) hours as needed for moderate pain.    [provider]  leuprolide (LUPRON) 30 MG injection Inject 45 mg into the muscle every 6 (six) months.    [provider]  lidocaine-prilocaine (EMLA) cream Apply 1 application topically as needed. 04/25/19   [name], MD    ondansetron (ZOFRAN) 4 MG tablet Take 4 mg by mouth every 8 (eight) hours as needed for nausea or vomiting.    [provider]  oxyCODONE (OXY IR/ROXICODONE) 5 MG immediate release tablet Take 1-2 tablets (5-10 mg total) by mouth every 4 (four) hours as needed for severe pain. 04/25/19   [name], MD  pantoprazole (PROTONIX) 40 MG tablet Take 40 mg by mouth daily after supper.     [provider]  polyethylene glycol (MIRALAX / GLYCOLAX) packet Take 17 g by mouth daily as needed for mild constipation. 08/17/18   Geradine Girt, DO  predniSONE (DELTASONE) 5 MG tablet TAKE 1 TABLET BY MOUTH 2 TIMES DAILY WITH A MEAL. 04/22/19   [name], MD  prochlorperazine (COMPAZINE) 10 MG tablet Take 1 tablet (10 mg total) by mouth every 6 (six) hours as needed for nausea or vomiting. 04/25/19   [name], MD  tamsulosin (FLOMAX) 0.4 MG CAPS capsule Take 0.4 mg by mouth daily.  08/10/16   [provider]  traMADol (ULTRAM) 50 MG tablet TAKE 1 TABLET BY MOUTH EVERY 6 HOURS AS NEEDED FOR PAIN 05/07/19   [name], MD  triamcinolone cream (KENALOG) 0.1 % Apply 1 application topically daily as needed (dry skin in the winter).     [provider]  ZYTIGA 250 MG tablet TAKE 4 TABLETS (1,000 MG TOTAL) BY MOUTH DAILY. TAKE ON AN EMPTY STOMACH 1 HOUR BEFORE OR 2 HOURS AFTER A MEAL 04/08/19   [name], MD    Family History Family History  Problem Relation Age of Onset   Colon cancer Mother    Colon cancer Brother    Prostate cancer Brother     Social History Social History   Tobacco Use   Smoking status: Former Smoker    Packs/day: 0.25    Years: 2.00    Pack years: 0.50    Last attempt to quit: 12/19/1968    Years since quitting: 50.4   Smokeless tobacco: Former Systems developer   Tobacco comment: quit smoking 67yrs ago  Substance Use Topics   Alcohol use: No   Drug use: No     Allergies   Statins   Review of Systems Review of Systems   Constitutional: Positive for fever.  HENT: Negative for sore throat.   Eyes: Negative for redness.  Respiratory: Positive for shortness of breath. Negative for cough.   Cardiovascular: Positive for chest pain.  Gastrointestinal: Negative for abdominal pain and vomiting.  Endocrine: Negative for polyuria.  Genitourinary: Negative for dysuria and flank pain.  Musculoskeletal: Negative for back pain and neck pain.  Skin: Negative for rash.  Neurological: Negative for headaches.  Hematological: Does not bruise/bleed easily.  Psychiatric/Behavioral: Negative for confusion.     Physical Exam Updated Vital Signs BP 111/75    Pulse (!) 122  Temp 100 F (37.8 C)    Resp 18    SpO2 96%   Physical Exam Vitals signs and nursing note reviewed.  Constitutional:      Appearance: Normal appearance. He is well-developed.  HENT:     Head: Atraumatic.     Nose: Nose normal.     Mouth/Throat:     Mouth: Mucous membranes are moist.     Pharynx: Oropharynx is clear.  Eyes:     General: No scleral icterus.    Conjunctiva/sclera: Conjunctivae normal.     Pupils: Pupils are equal, round, and reactive to light.  Neck:     Musculoskeletal: Normal range of motion and neck supple. No neck rigidity.     Trachea: No tracheal deviation.     Comments: No stiffness or rigidity.  Cardiovascular:     Rate and Rhythm: Regular rhythm. Tachycardia present.     Pulses: Normal pulses.     Heart sounds: Normal heart sounds. No murmur. No friction rub. No gallop.   Pulmonary:     Effort: Pulmonary effort is normal. No accessory muscle usage or respiratory distress.     Breath sounds: Normal breath sounds.     Comments: Right chest port, without pain, sts, erythema or sign of infection.  Chest:     Chest wall: No tenderness.  Abdominal:     General: Bowel sounds are normal. There is no distension.     Palpations: Abdomen is soft.     Tenderness: There is no abdominal tenderness. There is no guarding.   Genitourinary:    Comments: No cva tenderness. Musculoskeletal:        General: No swelling.     Right lower leg: Edema present.     Comments: T/L spine non tender, aligned. RLE swollen as compared to left. No cellulitis. Distal pulses palp bil.   Skin:    General: Skin is warm and dry.     Findings: No rash.  Neurological:     Mental Status: He is alert.     Comments: Alert, speech clear. Motor/sens grossly intact bil.   Psychiatric:        Mood and Affect: Mood normal.      ED Treatments / Results  Labs (all labs ordered are listed, but only abnormal results are displayed) Results for orders placed or performed during the hospital encounter of 05/17/19  SARS Coronavirus 2 (CEPHEID - Performed in [facility] hospital lab), [facility] Order  Result Value Ref Range   SARS Coronavirus 2 NEGATIVE NEGATIVE  Lactic acid, plasma  Result Value Ref Range   Lactic Acid, Venous 3.1 (HH) 0.5 - 1.9 mmol/L  Comprehensive metabolic panel  Result Value Ref Range   Sodium 136 135 - 145 mmol/L   Potassium 4.1 3.5 - 5.1 mmol/L   Chloride 103 98 - 111 mmol/L   CO2 23 22 - 32 mmol/L   Glucose, Bld 128 (H) 70 - 99 mg/dL   BUN 10 8 - 23 mg/dL   Creatinine, Ser 0.72 0.61 - 1.24 mg/dL   Calcium 8.6 (L) 8.9 - 10.3 mg/dL   Total Protein 5.2 (L) 6.5 - 8.1 g/dL   Albumin 2.8 (L) 3.5 - 5.0 g/dL   AST 20 15 - 41 U/L   ALT 13 0 - 44 U/L   Alkaline Phosphatase 82 38 - 126 U/L   Total Bilirubin 0.7 0.3 - 1.2 mg/dL   GFR calc non Af Amer >60 >60 mL/min   GFR calc Af Amer >  60 >60 mL/min   Anion gap 10 5 - 15  CBC WITH DIFFERENTIAL  Result Value Ref Range   WBC 4.1 4.0 - 10.5 K/uL   RBC 3.55 (L) 4.22 - 5.81 MIL/uL   Hemoglobin 10.5 (L) 13.0 - 17.0 g/dL   HCT 33.2 (L) 39.0 - 52.0 %   MCV 93.5 80.0 - 100.0 fL   MCH 29.6 26.0 - 34.0 pg   MCHC 31.6 30.0 - 36.0 g/dL   RDW 14.7 11.5 - 15.5 %   Platelets 233 150 - 400 K/uL   nRBC 0.5 (H) 0.0 - 0.2 %   Neutrophils Relative % 56 %   Neutro Abs 2.4 1.7 -  7.7 K/uL   Lymphocytes Relative 11 %   Lymphs Abs 0.4 (L) 0.7 - 4.0 K/uL   Monocytes Relative 14 %   Monocytes Absolute 0.6 0.1 - 1.0 K/uL   Eosinophils Relative 1 %   Eosinophils Absolute 0.0 0.0 - 0.5 K/uL   Basophils Relative 1 %   Basophils Absolute 0.1 0.0 - 0.1 K/uL   WBC Morphology DOHLE BODIES    Immature Granulocytes 17 %   Abs Immature Granulocytes 0.69 (H) 0.00 - 0.07 K/uL   Ct Chest W Contrast  Result Date: 04/23/2019 CLINICAL DATA:  Follow-up metastatic prostate cancer EXAM: CT CHEST, ABDOMEN, AND PELVIS WITH CONTRAST TECHNIQUE: Multidetector CT imaging of the chest, abdomen and pelvis was performed following the standard protocol during bolus administration of intravenous contrast. CONTRAST:  173mL OMNIPAQUE IOHEXOL 300 MG/ML SOLN, additional oral enteric contrast COMPARISON:  07/23/2018, MR lumbar spine, 01/25/2019 FINDINGS: CT CHEST FINDINGS Cardiovascular: Scattered coronary artery calcifications. Normal heart size. No pericardial effusion. Mediastinum/Nodes: There are newly enlarged mediastinal and right hilar lymph nodes, measuring up to 1.4 cm in short axis (series 3, image 27). Thyroid gland, trachea, and esophagus demonstrate no significant findings. Lungs/Pleura: Tiny centrilobular nodules, likely sequelae of infection or inflammation such as smoking-related respiratory bronchiolitis. Scattered ground-glass opacities of the right upper lobe (series 8, image 63). No pleural effusion or pneumothorax. Musculoskeletal: No chest wall mass or suspicious bone lesions identified. CT ABDOMEN PELVIS FINDINGS Hepatobiliary: There is a new hypodense lesion of the right lobe of the liver measuring 6.0 x 5.3 cm (series 3, image 55). No gallstones, gallbladder wall thickening, or biliary dilatation. Pancreas: Unremarkable. No pancreatic ductal dilatation or surrounding inflammatory changes. Spleen: Normal in size without focal abnormality. Adrenals/Urinary Tract: Interval enlargement of a left  adrenal mass, measuring 6.0 x 4.4 cm, previously 3.4 cm (series 3, image 56). Large exophytic cyst of the left kidney. No calculi or hydronephrosis. Bladder is unremarkable. Stomach/Bowel: Stomach is within normal limits. Appendix appears normal. No evidence of bowel wall thickening, distention, or inflammatory changes. Vascular/Lymphatic: Mixed calcific atherosclerosis. There are numerous new and enlarged bulky retroperitoneal and iliac lymph nodes, the largest matted lymph nodes of the right iliac chain measuring at least 2.5 cm (series 3, image 85). Reproductive: Evaluation of the pelvis is limited by bilateral hip total arthroplasty and associated streak artifact. Brachytherapy pellets are present prostate. Other: No abdominal wall hernia or abnormality. No abdominopelvic ascites. Musculoskeletal: Redemonstrated vertebral body fracture deformities of L3 and L5. Increased sclerosis of an osseous metastasis of the left ischial ramus (series 3, image 127). IMPRESSION: 1. There are newly enlarged mediastinal and right hilar lymph nodes, a new hypodense lesion of the right lobe of the liver, interval enlargement of a left adrenal mass, and numerous new and enlarged bulky retroperitoneal and iliac lymph nodes,  and increased sclerosis of an osseous metastasis of the left ischial ramus (series 3, image 127). findings consistent with worsening metastatic disease. 2. Scattered nonspecific infectious or inflammatory ground-glass opacities of the right upper lobe (series 8, image 63). Attention on follow-up. 3.  Chronic and incidental findings as detailed above. Electronically Signed   By: Eddie Candle M.D.   On: 04/23/2019 16:09   Ct Angio Chest Pe W/cm &/or Wo Cm  Result Date: 05/17/2019 CLINICAL DATA:  Pleuritic chest pain beginning today. Currently undergoing treatment for prostate carcinoma. High pretest probability for pulmonary embolism. EXAM: CT ANGIOGRAPHY CHEST WITH CONTRAST TECHNIQUE: Multidetector CT imaging  of the chest was performed using the standard protocol during bolus administration of intravenous contrast. Multiplanar CT image reconstructions and MIPs were obtained to evaluate the vascular anatomy. CONTRAST:  78mL OMNIPAQUE IOHEXOL 350 MG/ML SOLN COMPARISON:  04/23/2019 FINDINGS: Cardiovascular: Small volume pulmonary embolism is seen in the right lower lobar and segmental pulmonary arteries. No evidence of thoracic aortic dissection or aneurysm. Mediastinum/Nodes: Mild mediastinal and right hilar lymphadenopathy remains stable. Lungs/Pleura: No evidence of pulmonary infiltrate or mass. There has been near complete resolution of ground-glass opacities in the right upper lobe since prior study. Tiny right pleural effusion is noted. Upper abdomen: Large mass measuring approximately 6 cm is again seen in the posterior right hepatic lobe. Large left adrenal mass measuring approximately 5 mm is again demonstrated. These findings are consistent with metastatic disease. Musculoskeletal: No suspicious bone lesions identified. Review of the MIP images confirms the above findings. IMPRESSION: Small volume pulmonary embolism in right lower lobar and segmental pulmonary arteries. Stable mild mediastinal and right hilar lymphadenopathy, suspicious for metastatic disease. Stable left adrenal and right hepatic lobe masses, consistent with metastatic disease. Critical Value/emergent results were called by telephone at the time of interpretation on 05/17/2019 at 10:38 pm to Dr. Lajean Saver , who verbally acknowledged these results. Electronically Signed   By: Earle Gell M.D.   On: 05/17/2019 22:41   Mr Lumbar Spine W Wo Contrast  Result Date: 04/26/2019 CLINICAL DATA:  Metastatic prostate cancer. Decompressive laminectomy in tumor resection at L3 in August of last year. Stereotactic radio surgery of metastatic disease at L1 and L3 completed in November of last year. Difficulty walking and balance disturbance. EXAM: MRI  LUMBAR SPINE WITHOUT AND WITH CONTRAST TECHNIQUE: Multiplanar and multiecho pulse sequences of the lumbar spine were obtained without and with intravenous contrast. CONTRAST:  90mL MULTIHANCE GADOBENATE DIMEGLUMINE 529 MG/ML IV SOLN COMPARISON:  01/25/2019.  CT 04/23/2019. FINDINGS: Segmentation: 5 lumbar type vertebral bodies as numbered previously. Alignment:  Straightening of the normal lumbar lordosis. Vertebrae:  Inferior T11 and T12 remain negative. Treated metastatic lesion within the right posterior corner of the L1 vertebral body with early extension into the right pedicle measures 1 mm larger than on the previous exam. Tumor shows minimal involvement of the superior foramen on the right without apparent compression of the right L1 nerve. No tumor seen within the L2 vertebral body. Pathologic fracture of L3 appears the same. Loss of height of 80% centrally. 1 cm of posterior bulging of the posterosuperior margin of the vertebral body. Previous posterior decompression at this level. New regional marrow space lesion in the anterior portion of the L4 vertebral body, measuring up to 2.5 cm in size. This is consistent with a new and rapidly progressive metastatic lesion. L5 shows an old benign superior endplate fracture. No sign of tumor affecting the L5 vertebral body. S1 and S2  are negative for tumor. Conus medullaris and cauda equina: Conus extends to the L1 level. Conus and cauda equina appear normal. Paraspinal and other soft tissues: Progressive retroperitoneal adenopathy. Retroperitoneal lymph node anterior to the IVC at the L4 level now measures 2.9 cm in length compared with 1.8 cm in February. Disc levels: L1-2: Chronic disc bulge without compressive stenosis. L2-3: Previous posterior decompression on the left. 1 cm posterior bulging of the posterosuperior margin of the L3 vertebral body as noted above. This indents the thecal sac. Narrowing of the right lateral recess without distinct neural  compression. L3-4: Bulging of the disc. Facet and ligamentous hypertrophy. Mild stenosis of both lateral recesses and foramina. L4-5: Bulging of the disc. Mild facet and ligamentous hypertrophy. Mild bilateral lateral recess and foraminal narrowing without distinct neural compression. L5-S1: No disc abnormality. Minimal facet hypertrophy. No stenosis. IMPRESSION: Treated lesion in the right posterior L1 vertebral body and pedicle is 1 mm larger, nearly stable. Very early involvement of the superior foramen on the right without gross compression of the exiting right L1 nerve. Treated pathologic fracture at L3 without evidence of progressive disease. Previous posterior decompression on the left. 1 cm posterior bulging of the posterosuperior margin of the L3 vertebral body is unchanged. New metastatic lesion within the anterior L4 vertebral body measuring up to 2.5 cm in diameter. Stable benign superior endplate compression fracture at L5. Considerable progression of retroperitoneal lymphadenopathy since February. Electronically Signed   By: Nelson Chimes M.D.   On: 04/26/2019 11:05   Ct Abdomen Pelvis W Contrast  Result Date: 04/23/2019 CLINICAL DATA:  Follow-up metastatic prostate cancer EXAM: CT CHEST, ABDOMEN, AND PELVIS WITH CONTRAST TECHNIQUE: Multidetector CT imaging of the chest, abdomen and pelvis was performed following the standard protocol during bolus administration of intravenous contrast. CONTRAST:  171mL OMNIPAQUE IOHEXOL 300 MG/ML SOLN, additional oral enteric contrast COMPARISON:  07/23/2018, MR lumbar spine, 01/25/2019 FINDINGS: CT CHEST FINDINGS Cardiovascular: Scattered coronary artery calcifications. Normal heart size. No pericardial effusion. Mediastinum/Nodes: There are newly enlarged mediastinal and right hilar lymph nodes, measuring up to 1.4 cm in short axis (series 3, image 27). Thyroid gland, trachea, and esophagus demonstrate no significant findings. Lungs/Pleura: Tiny centrilobular  nodules, likely sequelae of infection or inflammation such as smoking-related respiratory bronchiolitis. Scattered ground-glass opacities of the right upper lobe (series 8, image 63). No pleural effusion or pneumothorax. Musculoskeletal: No chest wall mass or suspicious bone lesions identified. CT ABDOMEN PELVIS FINDINGS Hepatobiliary: There is a new hypodense lesion of the right lobe of the liver measuring 6.0 x 5.3 cm (series 3, image 55). No gallstones, gallbladder wall thickening, or biliary dilatation. Pancreas: Unremarkable. No pancreatic ductal dilatation or surrounding inflammatory changes. Spleen: Normal in size without focal abnormality. Adrenals/Urinary Tract: Interval enlargement of a left adrenal mass, measuring 6.0 x 4.4 cm, previously 3.4 cm (series 3, image 56). Large exophytic cyst of the left kidney. No calculi or hydronephrosis. Bladder is unremarkable. Stomach/Bowel: Stomach is within normal limits. Appendix appears normal. No evidence of bowel wall thickening, distention, or inflammatory changes. Vascular/Lymphatic: Mixed calcific atherosclerosis. There are numerous new and enlarged bulky retroperitoneal and iliac lymph nodes, the largest matted lymph nodes of the right iliac chain measuring at least 2.5 cm (series 3, image 85). Reproductive: Evaluation of the pelvis is limited by bilateral hip total arthroplasty and associated streak artifact. Brachytherapy pellets are present prostate. Other: No abdominal wall hernia or abnormality. No abdominopelvic ascites. Musculoskeletal: Redemonstrated vertebral body fracture deformities of L3 and L5. Increased  sclerosis of an osseous metastasis of the left ischial ramus (series 3, image 127). IMPRESSION: 1. There are newly enlarged mediastinal and right hilar lymph nodes, a new hypodense lesion of the right lobe of the liver, interval enlargement of a left adrenal mass, and numerous new and enlarged bulky retroperitoneal and iliac lymph nodes, and  increased sclerosis of an osseous metastasis of the left ischial ramus (series 3, image 127). findings consistent with worsening metastatic disease. 2. Scattered nonspecific infectious or inflammatory ground-glass opacities of the right upper lobe (series 8, image 63). Attention on follow-up. 3.  Chronic and incidental findings as detailed above. Electronically Signed   By: Eddie Candle M.D.   On: 04/23/2019 16:09   Dg Chest Port 1 View  Result Date: 05/17/2019 CLINICAL DATA:  Fever EXAM: PORTABLE CHEST 1 VIEW COMPARISON:  None. FINDINGS: The heart size and mediastinal contours are within normal limits. Both lungs are clear. The visualized skeletal structures are unremarkable. There is a right chest wall power-injectable Port-A-Cath with tip in the proximal right atrium via a right internal jugular vein approach. IMPRESSION: No active disease. Electronically Signed   By: Ulyses Jarred M.D.   On: 05/17/2019 21:24   Ir Imaging Guided Port Insertion  Result Date: 05/01/2019 INDICATION: 78 year old with prostate cancer.  Port-A-Cath needed for therapy. EXAM: FLUOROSCOPIC AND ULTRASOUND GUIDED PLACEMENT OF A SUBCUTANEOUS PORT COMPARISON:  None. MEDICATIONS: Ancef 2 g; The antibiotic was administered within an appropriate time interval prior to skin puncture. ANESTHESIA/SEDATION: Versed 2.0 mg IV; Fentanyl 100 mcg IV; Moderate Sedation Time:  26 minutes The patient was continuously monitored during the procedure by the interventional radiology nurse under my direct supervision. FLUOROSCOPY TIME:  18 seconds, 3 mGy COMPLICATIONS: None immediate. PROCEDURE: The procedure, risks, benefits, and alternatives were explained to the patient. Questions regarding the procedure were encouraged and answered. The patient understands and consents to the procedure. Patient was placed supine on the interventional table. Ultrasound confirmed a patent right internal jugular vein. The right chest and neck were cleaned with a skin  antiseptic and a sterile drape was placed. Maximal barrier sterile technique was utilized including caps, mask, sterile gowns, sterile gloves, sterile drape, hand hygiene and skin antiseptic. The right neck was anesthetized with 1% lidocaine. Small incision was made in the right neck with a blade. Micropuncture set was placed in the right internal jugular vein with ultrasound guidance. The micropuncture wire was used for measurement purposes. The right chest was anesthetized with 1% lidocaine with epinephrine. #15 blade was used to make an incision and a subcutaneous port pocket was formed. Ingram was assembled. Subcutaneous tunnel was formed with a stiff tunneling device. The port catheter was brought through the subcutaneous tunnel. The port was placed in the subcutaneous pocket. The micropuncture set was exchanged for a peel-away sheath. The catheter was placed through the peel-away sheath and the tip was positioned at the SVC and right atrium junction. Catheter placement was confirmed with fluoroscopy. The port was accessed and flushed with heparinized saline. The port pocket was closed using two layers of absorbable sutures and Dermabond. The vein skin site was closed using a single layer of absorbable suture and Dermabond. Sterile dressings were applied. Patient tolerated the procedure well without an immediate complication. Ultrasound and fluoroscopic images were taken and saved for this procedure. IMPRESSION: Placement of a subcutaneous port device. Catheter tip at the SVC and right atrium junction. Electronically Signed   By: Scherrie Gerlach.D.  On: 05/01/2019 10:16    EKG (note: ecg not crossing over into muse) ED ECG REPORT   Date: 05/17/2019  Rate: 122  Rhythm: sinus tachycardia  QRS Axis: left  Intervals: normal  ST/T Wave abnormalities: normal  Conduction Disutrbances:none  Narrative Interpretation:   Old EKG Reviewed: changes noted  I have personally reviewed the EKG tracing     Radiology Ct Angio Chest Pe W/cm &/or Wo Cm  Result Date: 05/17/2019 CLINICAL DATA:  Pleuritic chest pain beginning today. Currently undergoing treatment for prostate carcinoma. High pretest probability for pulmonary embolism. EXAM: CT ANGIOGRAPHY CHEST WITH CONTRAST TECHNIQUE: Multidetector CT imaging of the chest was performed using the standard protocol during bolus administration of intravenous contrast. Multiplanar CT image reconstructions and MIPs were obtained to evaluate the vascular anatomy. CONTRAST:  43mL OMNIPAQUE IOHEXOL 350 MG/ML SOLN COMPARISON:  04/23/2019 FINDINGS: Cardiovascular: Small volume pulmonary embolism is seen in the right lower lobar and segmental pulmonary arteries. No evidence of thoracic aortic dissection or aneurysm. Mediastinum/Nodes: Mild mediastinal and right hilar lymphadenopathy remains stable. Lungs/Pleura: No evidence of pulmonary infiltrate or mass. There has been near complete resolution of ground-glass opacities in the right upper lobe since prior study. Tiny right pleural effusion is noted. Upper abdomen: Large mass measuring approximately 6 cm is again seen in the posterior right hepatic lobe. Large left adrenal mass measuring approximately 5 mm is again demonstrated. These findings are consistent with metastatic disease. Musculoskeletal: No suspicious bone lesions identified. Review of the MIP images confirms the above findings. IMPRESSION: Small volume pulmonary embolism in right lower lobar and segmental pulmonary arteries. Stable mild mediastinal and right hilar lymphadenopathy, suspicious for metastatic disease. Stable left adrenal and right hepatic lobe masses, consistent with metastatic disease. Critical Value/emergent results were called by telephone at the time of interpretation on 05/17/2019 at 10:38 pm to Dr. Lajean Saver , who verbally acknowledged these results. Electronically Signed   By: Earle Gell M.D.   On: 05/17/2019 22:41   Dg Chest Port 1  View  Result Date: 05/17/2019 CLINICAL DATA:  Fever EXAM: PORTABLE CHEST 1 VIEW COMPARISON:  None. FINDINGS: The heart size and mediastinal contours are within normal limits. Both lungs are clear. The visualized skeletal structures are unremarkable. There is a right chest wall power-injectable Port-A-Cath with tip in the proximal right atrium via a right internal jugular vein approach. IMPRESSION: No active disease. Electronically Signed   By: Ulyses Jarred M.D.   On: 05/17/2019 21:24    Procedures Procedures (including critical care time)  Medications Ordered in ED Medications - No data to display   Initial Impression / Assessment and Plan / ED Course  I have reviewed the triage vital signs and the nursing notes.  Pertinent labs & imaging results that were available during my care of the patient were reviewed by me and considered in my medical decision making (see chart for details).  Stat labs and cultures sent. Iv antibiotics given.   Iv ns. Continuous pulse ox and monitor. Pcxr. Ecg.   Ct chest.  Reviewed nursing notes and prior charts for additional history.   Pt currently declines pain med.   Labs reviewed by me - wbc 4, not neutropenic. Lactate high, iv ns bolus.  covid neg.   [name] was evaluated in Emergency Department on 05/17/2019 for the symptoms described in the history of present illness. He was evaluated in the context of the global COVID-19 pandemic, which necessitated consideration that the patient might be at risk  for infection with the SARS-CoV-2 virus that causes COVID-19. Institutional protocols and algorithms that pertain to the evaluation of patients at risk for COVID-19 are in a state of rapid change based on information released by regulatory bodies including the CDC and federal and state organizations. These policies and algorithms were followed during the patient's care in the ED.  CXR reviewed by me - no pna.  CT reviewed by me, and discussed w  radiologist - right PE.   Heparin iv per pharmacy.   Admit team can obtain dvt study RLE in AM.   ?source fever. abd soft nt. No new pain x left upper back/chest. Spine nt. No obvious cellulitis. No pna on xr. ua remains pending.   Trop is pending ( poc trop initially ordered, but mini lab sent blood to main) -  Trop added to labs. ecg not crossing over to muse, but sinus tach without acute st/t changes.   Recheck pt comfortable, vitals/tachycardia improved.   Medicine service consulted for admission.  CRITICAL CARE RE: acute chest/back pain, pulmonary embolus, metastatic  Ca, febrile illness, elevated lactate.  Performed by: Mirna Mires Total critical care time: 35 minutes Critical care time was exclusive of separately billable procedures and treating other patients. Critical care was necessary to treat or prevent imminent or life-threatening deterioration. Critical care was time spent personally by me on the following activities: development of treatment plan with patient and/or surrogate as well as nursing, discussions with consultants, evaluation of patient's response to treatment, examination of patient, obtaining history from patient or surrogate, ordering and performing treatments and interventions, ordering and review of laboratory studies, ordering and review of radiographic studies, pulse oximetry and re-evaluation of patient's condition.     Final Clinical Impressions(s) / ED Diagnoses   Final diagnoses:  None    ED Discharge Orders    None           Lajean Saver, MD 05/17/19 2311

## 2019-05-17 NOTE — Progress Notes (Signed)
ANTICOAGULATION CONSULT NOTE - Initial Consult  Pharmacy Consult for heparin Indication: pulmonary embolus  Allergies  Allergen Reactions  . Statins Other (See Comments)    Muscle pain    Patient Measurements:   Heparin Dosing Weight: 85 kg  Vital Signs: Temp: 100 F (37.8 C) (05/29 2020) BP: 127/73 (05/29 2230) Pulse Rate: 98 (05/29 2230)  Labs: Recent Labs    05/17/19 2043  HGB 10.5*  HCT 33.2*  PLT 233  CREATININE 0.72    Estimated Creatinine Clearance: 82.4 mL/min (by C-G formula based on SCr of 0.72 mg/dL).   Medical History: Past Medical History:  Diagnosis Date  . Arthritis   . Barrett's esophagus   . Bone cancer ([place])   . Cataract   . GERD (gastroesophageal reflux disease)    takes Protonix daily  . History of colon polyps   . Hyperlipidemia    takes Zetia and Welchol daily  . Joint pain   . Muscle spasm    takes Robaxin daily as needed  . Prostate cancer ([place])     Medications:  See medication history  Assessment: 78 yo man with h/o metastatic prostate cancer to start heparin for PE.  Baseline Hg 10.5, PTLC 233.  He was not on anticoagulation PTA Goal of Therapy:  Heparin level 0.3-0.7 units/ml Monitor platelets by anticoagulation protocol: Yes   Plan:  Heparin 5000 unit bolus and drip at 1500 units/hr Check heparin level 6-8 hours after start Daily HL and CBC while on heparin Monitor for bleeding complications  Thanks for allowing pharmacy to be a part of this patient's care.  Excell Seltzer, PharmD Clinical Pharmacist

## 2019-05-18 ENCOUNTER — Observation Stay (HOSPITAL_BASED_OUTPATIENT_CLINIC_OR_DEPARTMENT_OTHER): Payer: Medicare Other

## 2019-05-18 DIAGNOSIS — I2699 Other pulmonary embolism without acute cor pulmonale: Secondary | ICD-10-CM | POA: Diagnosis not present

## 2019-05-18 DIAGNOSIS — M7989 Other specified soft tissue disorders: Secondary | ICD-10-CM | POA: Diagnosis not present

## 2019-05-18 DIAGNOSIS — I361 Nonrheumatic tricuspid (valve) insufficiency: Secondary | ICD-10-CM | POA: Diagnosis not present

## 2019-05-18 DIAGNOSIS — C61 Malignant neoplasm of prostate: Secondary | ICD-10-CM

## 2019-05-18 LAB — CBC
HCT: 29.8 % — ABNORMAL LOW (ref 39.0–52.0)
Hemoglobin: 9.6 g/dL — ABNORMAL LOW (ref 13.0–17.0)
MCH: 29.8 pg (ref 26.0–34.0)
MCHC: 32.2 g/dL (ref 30.0–36.0)
MCV: 92.5 fL (ref 80.0–100.0)
Platelets: 218 10*3/uL (ref 150–400)
RBC: 3.22 MIL/uL — ABNORMAL LOW (ref 4.22–5.81)
RDW: 15.2 % (ref 11.5–15.5)
WBC: 6.5 10*3/uL (ref 4.0–10.5)
nRBC: 0.3 % — ABNORMAL HIGH (ref 0.0–0.2)

## 2019-05-18 LAB — BASIC METABOLIC PANEL
Anion gap: 9 (ref 5–15)
BUN: 7 mg/dL — ABNORMAL LOW (ref 8–23)
CO2: 23 mmol/L (ref 22–32)
Calcium: 8 mg/dL — ABNORMAL LOW (ref 8.9–10.3)
Chloride: 104 mmol/L (ref 98–111)
Creatinine, Ser: 0.69 mg/dL (ref 0.61–1.24)
GFR calc Af Amer: >60 mL/min (ref >60)
GFR calc non Af Amer: >60 mL/min (ref >60)
Glucose, Bld: 104 mg/dL — ABNORMAL HIGH (ref 70–99)
Potassium: 4.2 mmol/L (ref 3.5–5.1)
Sodium: 136 mmol/L (ref 135–145)

## 2019-05-18 LAB — HEPARIN LEVEL (UNFRACTIONATED): Heparin Unfractionated: 0.67 IU/mL (ref 0.30–0.70)

## 2019-05-18 LAB — ECHOCARDIOGRAM COMPLETE: Weight: 2982.4 oz

## 2019-05-18 LAB — LACTIC ACID, PLASMA: Lactic Acid, Venous: 1.7 mmol/L (ref 0.5–1.9)

## 2019-05-18 MED ORDER — ENOXAPARIN SODIUM 80 MG/0.8ML ~~LOC~~ SOLN
80.0000 mg | Freq: Two times a day (BID) | SUBCUTANEOUS | Status: DC
  Administered 2019-05-18: 11:00:00 80 mg via SUBCUTANEOUS
  Filled 2019-05-18: qty 0.8

## 2019-05-18 MED ORDER — HEPARIN SOD (PORK) LOCK FLUSH 100 UNIT/ML IV SOLN
500.0000 [IU] | INTRAVENOUS | Status: DC | PRN
  Filled 2019-05-18: qty 5

## 2019-05-18 MED ORDER — HEPARIN SOD (PORK) LOCK FLUSH 100 UNIT/ML IV SOLN
500.0000 [IU] | INTRAVENOUS | Status: DC
  Filled 2019-05-18: qty 5

## 2019-05-18 MED ORDER — ENOXAPARIN SODIUM 80 MG/0.8ML ~~LOC~~ SOLN: 80.0000 mg | Freq: Two times a day (BID) | SUBCUTANEOUS | 1 refills | Status: DC

## 2019-05-18 MED ORDER — TRAZODONE HCL 50 MG PO TABS
50.0000 mg | ORAL_TABLET | Freq: Once | ORAL | Status: AC
  Administered 2019-05-18: 50 mg via ORAL
  Filled 2019-05-18: qty 1

## 2019-05-18 NOTE — Progress Notes (Signed)
ANTICOAGULATION CONSULT NOTE Pharmacy Consult for heparin > enoxaparin Indication: pulmonary embolus  Allergies  Allergen Reactions  . Statins Other (See Comments)    Muscle pain    Patient Measurements: Weight: 186 lb 6.4 oz (84.6 kg) Heparin Dosing Weight: 85 kg  Vital Signs: Temp: 98.8 F (37.1 C) (05/30 0437) Temp Source: Oral (05/30 0437) BP: 131/65 (05/30 0437) Pulse Rate: 91 (05/30 0437)  Labs: Recent Labs    05/17/19 2043 05/17/19 2257 05/18/19 0433  HGB 10.5*  --  9.6*  HCT 33.2*  --  29.8*  PLT 233  --  218  HEPARINUNFRC  --   --  0.67  CREATININE 0.72  --  0.69  TROPONINI  --  <0.03  --     Estimated Creatinine Clearance: 82.4 mL/min (by C-G formula based on SCr of 0.69 mg/dL).   Assessment: 78 yo man with h/o metastatic prostate cancer to start heparin for PE.  Baseline Hg 10.5, PTLC 233.  He was not on anticoagulation PTA  Initial heparin level therapeutic CBC stable  Plan to change from heparin drip to enoxaparin 1mg /kg q12hr for ongoing AC insetting of CA.  Cr 0.7 CrCl > 56ml/min  Goal of Therapy:  Heparin level 0.3-0.7 units/ml Monitor platelets by anticoagulation protocol: Yes   Plan:  Stop heparin drip  Enoxaparin 1mg /kg = 80mg  q12h Monitor s/s bleeding and CBC   Bonnita Nasuti Pharm.D. CPP, BCPS Clinical Pharmacist [phone] 05/18/2019 10:53 AM

## 2019-05-18 NOTE — H&P (Signed)
History and Physical    [name] UJW:[id number] DOB: 28-Nov-1941 DOA: 05/17/2019  PCP: [name], MD  Patient coming from: Home  I have personally briefly reviewed patient's old medical records in [place]  Chief Complaint: CP  HPI: [name] is a 78 y.o. male with medical history significant of metastatic prostate cancer.  Was recently started on IV chemotherapy this month due to progressive disease despite treatment with Zytiga.  Patient had chemo on 5/22.  Patient presents to the ED with c/o L upper back pain and L chest pain for past 1 day.  Symptoms occurred at rest, acute onset, persistent.  Worse with deep breaths.  Mild SOB, no known COVID exposures.  EMS tried NTG for CP but this just dropped his pressure.   ED Course: Tm 100.0 WBC 4.1k.  Lactate 3.1, trop neg.  HR 122 initially (now down to 100)  CXR neg.  Got doses of rocephin / vanc in ED as well as 1L bolus.  CTA is positive for acute PE.  Small clot burden, located in RLL and segmental pulmonary arteries.   Review of Systems: As per HPI otherwise 10 point review of systems negative. Past Medical History:  Diagnosis Date  . Arthritis   . Barrett's esophagus   . Bone cancer ([place])   . Cataract   . GERD (gastroesophageal reflux disease)    takes Protonix daily  . History of colon polyps   . Hyperlipidemia    takes Zetia and Welchol daily  . Joint pain   . Muscle spasm    takes Robaxin daily as needed  . Prostate cancer [facility])     Past Surgical History:  Procedure Laterality Date  . COLONOSCOPY    . ESOPHAGOGASTRODUODENOSCOPY    . IR IMAGING GUIDED PORT INSERTION  05/01/2019  . KNEE ARTHROSCOPY Right 2011  . LUMBAR LAMINECTOMY/DECOMPRESSION MICRODISCECTOMY Left 08/06/2018   Procedure: Laminectomy and Foraminotomy - Lumbar Three-Four- left;  Surgeon: Kary Kos, MD;  Location: [place];  Service: Neurosurgery;  Laterality: Left;  left  . TOTAL HIP ARTHROPLASTY Left 05/07/2013    Procedure: LEFT TOTAL HIP ARTHROPLASTY ANTERIOR APPROACH;  Surgeon: Mcarthur Rossetti, MD;  Location: [place];  Service: Orthopedics;  Laterality: Left;  . TOTAL HIP ARTHROPLASTY Right   . TOTAL HIP REVISION Right 02/02/2015   Procedure: TOTAL HIP REVISION;  Surgeon: Kerin Salen, MD;  Location: [place];  Service: Orthopedics;  Laterality: Right;     reports that he quit smoking about 50 years ago. He has a 0.50 pack-year smoking history. He has quit using smokeless tobacco. He reports that he does not drink alcohol or use drugs.  Allergies  Allergen Reactions  . Statins Other (See Comments)    Muscle pain    Family History  Problem Relation Age of Onset  . Colon cancer Mother   . Colon cancer Brother   . Prostate cancer Brother      Prior to Admission medications   Medication Sig Start Date End Date Taking? Authorizing Provider  Calcium Carb-Cholecalciferol (CALCIUM 1000 + D PO) Take 30 mLs by mouth daily. Calcium 1000 mg / Vit D 1000 iu    [provider]  cycloSPORINE (RESTASIS) 0.05 % ophthalmic emulsion Place 1 drop into both eyes 2 (two) times daily.    [provider]  docusate sodium (COLACE) 100 MG capsule Take 100 mg by mouth 2 (two) times daily.    [provider]  ezetimibe (ZETIA) 10  MG tablet Take 10 mg by mouth every morning.     [provider]  ibuprofen (ADVIL,MOTRIN) 200 MG tablet Take 200 mg by mouth every 6 (six) hours as needed for moderate pain.    [provider]  leuprolide (LUPRON) 30 MG injection Inject 45 mg into the muscle every 6 (six) months.    [provider]  lidocaine-prilocaine (EMLA) cream Apply 1 application topically as needed. 04/25/19   [name], MD  ondansetron (ZOFRAN) 4 MG tablet Take 4 mg by mouth every 8 (eight) hours as needed for nausea or vomiting.    [provider]  oxyCODONE (OXY IR/ROXICODONE) 5 MG immediate release tablet Take 1-2 tablets (5-10 mg total) by mouth  every 4 (four) hours as needed for severe pain. 04/25/19   [name], MD  pantoprazole (PROTONIX) 40 MG tablet Take 40 mg by mouth daily after supper.     [provider]  polyethylene glycol (MIRALAX / GLYCOLAX) packet Take 17 g by mouth daily as needed for mild constipation. 08/17/18   Geradine Girt, DO  predniSONE (DELTASONE) 5 MG tablet TAKE 1 TABLET BY MOUTH 2 TIMES DAILY WITH A MEAL. 04/22/19   [name], MD  prochlorperazine (COMPAZINE) 10 MG tablet Take 1 tablet (10 mg total) by mouth every 6 (six) hours as needed for nausea or vomiting. 04/25/19   [name], MD  tamsulosin (FLOMAX) 0.4 MG CAPS capsule Take 0.4 mg by mouth daily.  08/10/16   [provider]  traMADol (ULTRAM) 50 MG tablet TAKE 1 TABLET BY MOUTH EVERY 6 HOURS AS NEEDED FOR PAIN 05/07/19   [name], MD  triamcinolone cream (KENALOG) 0.1 % Apply 1 application topically daily as needed (dry skin in the winter).     [provider]  ZYTIGA 250 MG tablet TAKE 4 TABLETS (1,000 MG TOTAL) BY MOUTH DAILY. TAKE ON AN EMPTY STOMACH 1 HOUR BEFORE OR 2 HOURS AFTER A MEAL 04/08/19   [name], MD    Physical Exam: Vitals:   05/17/19 2115 05/17/19 2230 05/17/19 2326 05/17/19 2345  BP: 116/74 127/73  129/74  Pulse: (!) 102 98  97  Resp: (!) 23 20  18   Temp:   99.5 F (37.5 C)   TempSrc:   Oral   SpO2: 94% 95%  95%    Constitutional: NAD, calm, comfortable Eyes: PERRL, lids and conjunctivae normal ENMT: Mucous membranes are moist. Posterior pharynx clear of any exudate or lesions.Normal dentition.  Neck: normal, supple, no masses, no thyromegaly Respiratory: clear to auscultation bilaterally, no wheezing, no crackles. Normal respiratory effort. No accessory muscle use.  Cardiovascular: Regular rate and rhythm, no murmurs / rubs / gallops. No extremity edema. 2+ pedal pulses. No carotid bruits.  Abdomen: no tenderness, no masses palpated. No hepatosplenomegaly. Bowel sounds  positive.  Musculoskeletal: RLE swollen as compared to left. No cellulitis.  Skin: Right chest port, without pain, sts, erythema or sign of infection.  Neurologic: CN 2-12 grossly intact. Sensation intact, DTR normal. Strength 5/5 in all 4.  Psychiatric: Normal judgment and insight. Alert and oriented x 3. Normal mood.    Labs on Admission: I have personally reviewed following labs and imaging studies  CBC: Recent Labs  Lab 05/17/19 2043  WBC 4.1  NEUTROABS 2.4  HGB 10.5*  HCT 33.2*  MCV 93.5  PLT 527   Basic Metabolic Panel: Recent Labs  Lab 05/17/19 2043  NA 136  K 4.1  CL 103  CO2 23  GLUCOSE 128*  BUN 10  CREATININE 0.72  CALCIUM 8.6*   GFR: Estimated Creatinine Clearance: 82.4 mL/min (by C-G formula based on SCr of 0.72 mg/dL). Liver Function Tests: Recent Labs  Lab 05/17/19 2043  AST 20  ALT 13  ALKPHOS 82  BILITOT 0.7  PROT 5.2*  ALBUMIN 2.8*   No results for input(s): LIPASE, AMYLASE in the last 168 hours. No results for input(s): AMMONIA in the last 168 hours. Coagulation Profile: No results for input(s): INR, PROTIME in the last 168 hours. Cardiac Enzymes: Recent Labs  Lab 05/17/19 2257  TROPONINI <0.03   BNP (last 3 results) No results for input(s): PROBNP in the last 8760 hours. HbA1C: No results for input(s): HGBA1C in the last 72 hours. CBG: No results for input(s): GLUCAP in the last 168 hours. Lipid Profile: No results for input(s): CHOL, HDL, LDLCALC, TRIG, CHOLHDL, LDLDIRECT in the last 72 hours. Thyroid Function Tests: No results for input(s): TSH, T4TOTAL, FREET4, T3FREE, THYROIDAB in the last 72 hours. Anemia Panel: No results for input(s): VITAMINB12, FOLATE, FERRITIN, TIBC, IRON, RETICCTPCT in the last 72 hours. Urine analysis:    Component Value Date/Time   COLORURINE YELLOW 05/17/2019 2247   [place] 05/17/2019 2247   LABSPEC 1.030 05/17/2019 2247   PHURINE 7.0 05/17/2019 2247   [place] 05/17/2019  2247   [place] 05/17/2019 2247   [place] 05/17/2019 2247   KETONESUR NEGATIVE 05/17/2019 2247   PROTEINUR NEGATIVE 05/17/2019 2247   UROBILINOGEN 0.2 01/22/2015 0939   NITRITE NEGATIVE 05/17/2019 2247   LEUKOCYTESUR NEGATIVE 05/17/2019 2247    Radiological Exams on Admission: Ct Angio Chest Pe W/cm &/or Wo Cm  Result Date: 05/17/2019 CLINICAL DATA:  Pleuritic chest pain beginning today. Currently undergoing treatment for prostate carcinoma. High pretest probability for pulmonary embolism. EXAM: CT ANGIOGRAPHY CHEST WITH CONTRAST TECHNIQUE: Multidetector CT imaging of the chest was performed using the standard protocol during bolus administration of intravenous contrast. Multiplanar CT image reconstructions and MIPs were obtained to evaluate the vascular anatomy. CONTRAST:  42mL OMNIPAQUE IOHEXOL 350 MG/ML SOLN COMPARISON:  04/23/2019 FINDINGS: Cardiovascular: Small volume pulmonary embolism is seen in the right lower lobar and segmental pulmonary arteries. No evidence of thoracic aortic dissection or aneurysm. Mediastinum/Nodes: Mild mediastinal and right hilar lymphadenopathy remains stable. Lungs/Pleura: No evidence of pulmonary infiltrate or mass. There has been near complete resolution of ground-glass opacities in the right upper lobe since prior study. Tiny right pleural effusion is noted. Upper abdomen: Large mass measuring approximately 6 cm is again seen in the posterior right hepatic lobe. Large left adrenal mass measuring approximately 5 mm is again demonstrated. These findings are consistent with metastatic disease. Musculoskeletal: No suspicious bone lesions identified. Review of the MIP images confirms the above findings. IMPRESSION: Small volume pulmonary embolism in right lower lobar and segmental pulmonary arteries. Stable mild mediastinal and right hilar lymphadenopathy, suspicious for metastatic disease. Stable left adrenal and right hepatic lobe masses, consistent  with metastatic disease. Critical Value/emergent results were called by telephone at the time of interpretation on 05/17/2019 at 10:38 pm to Dr. Lajean Saver , who verbally acknowledged these results. Electronically Signed   By: Earle Gell M.D.   On: 05/17/2019 22:41   Dg Chest Port 1 View  Result Date: 05/17/2019 CLINICAL DATA:  Fever EXAM: PORTABLE CHEST 1 VIEW COMPARISON:  None. FINDINGS: The heart size and mediastinal contours are within normal limits. Both lungs are clear. The visualized skeletal  structures are unremarkable. There is a right chest wall power-injectable Port-A-Cath with tip in the proximal right atrium via a right internal jugular vein approach. IMPRESSION: No active disease. Electronically Signed   By: Ulyses Jarred M.D.   On: 05/17/2019 21:24    EKG: Independently reviewed.  Assessment/Plan Principal Problem:   Acute pulmonary embolism (HCC) Active Problems:   Prostate cancer metastatic to multiple sites ([place])    1. PE - 1. Heparin gtt 2. [place] BLE venous to look for source DVT (suspect occlusive DVT in RLE based on swelling) 3. 2d echo 4. Tele monitor 2. Metastatic prostate CA - 1. Continue prednisone 2. Last chemo on 5/22, not neutropenic 3. Looks like he was on zytiga previously, looks like they just started him on prednisone and cabazitaxel this month 4. Will put in for IP consult to oncology so he shows up on their list in AM.    DVT prophylaxis: Heparin gtt Code Status: Full Family Communication: No family in room Disposition Plan: Home after admit Consults called: None Admission status: Place in 8     , [place] Hospitalists  How to contact the [facility] Attending or Consulting provider [place] or covering provider during after hours [place], for this patient?  1. Check the care team in [facility] and look for a) attending/consulting TR[facility] provider listed and b) the [facility] team listed 2. Log into [email or web address]  Amion Physician Scheduling and  messaging for groups and whole hospitals  On call and physician scheduling software for group practices, residents, hospitalists and other medical providers for call, clinic, rotation and shift schedules. On[employer] is a hospital-wide system for scheduling doctors and paging doctors on call. EasyPlot is for scientific plotting and data analysis.  [email or web address]  and use [place]'s universal password to access. If you do not have the password, please contact the hospital operator.  3. Locate the [facility] provider you are looking for under [facility] and page to a number that you can be directly reached. 4. If you still have difficulty reaching the provider, please page the [facility] (Director on Call) for the Hospitalists listed on amion for assistance.  05/18/2019, 12:02 AM

## 2019-05-18 NOTE — Progress Notes (Signed)
Lower extremity venous has been completed.   Preliminary results in CV Proc.   Abram Sander 05/18/2019 9:17 AM

## 2019-05-18 NOTE — Progress Notes (Signed)
[place] for heparin Indication: pulmonary embolus  Allergies  Allergen Reactions  . Statins Other (See Comments)    Muscle pain    Patient Measurements: Weight: 186 lb 6.4 oz (84.6 kg) Heparin Dosing Weight: 85 kg  Vital Signs: Temp: 98.8 F (37.1 C) (05/30 0437) Temp Source: Oral (05/30 0437) BP: 131/65 (05/30 0437) Pulse Rate: 91 (05/30 0437)  Labs: Recent Labs    05/17/19 2043 05/17/19 2257 05/18/19 0433  HGB 10.5*  --  9.6*  HCT 33.2*  --  29.8*  PLT 233  --  218  HEPARINUNFRC  --   --  0.67  CREATININE 0.72  --  0.69  TROPONINI  --  <0.03  --     Estimated Creatinine Clearance: 82.4 mL/min (by C-G formula based on SCr of 0.69 mg/dL).   Assessment: 78 yo man with h/o metastatic prostate cancer to start heparin for PE.  Baseline Hg 10.5, PTLC 233.  He was not on anticoagulation PTA  Initial heparin level therapeutic CBC stable  Goal of Therapy:  Heparin level 0.3-0.7 units/ml Monitor platelets by anticoagulation protocol: Yes   Plan:  Continue heparin at 1500 units / hr Confirm therapeutic at 1400 pm Daily HL and CBC while on heparin Monitor for bleeding complications  Thanks for allowing pharmacy to be a part of this patient's care.  Anette Guarneri, PharmD [phone]

## 2019-05-18 NOTE — Care Management (Signed)
Pt's pharmacy reports patient's copay for Lovenox is $33.  It is in stock.  No further CM needs.

## 2019-05-18 NOTE — Discharge Summary (Signed)
Physician Discharge Summary  [name] HGD:[id number] DOB: 1941/01/11 DOA: 05/17/2019  PCP: [name], MD  Admit date: 05/17/2019 Discharge date: 05/18/2019  Admitted From: Observation Disposition: home  Recommendations for Outpatient Follow-up:  1. Follow up with PCP in 1-2 weeks 2. Please obtain BMP/CBC in one week 3. Please follow up with oncology as discussed:  Home Health:No Equipment/Devices:none  Discharge Condition:Stable CODE STATUS:Full code Diet recommendation: Regular healthy diet  Brief/Interim Summary: This is a 78 year old male with a medical history significant for metastatic prostate cancer with most recent chemotherapy May 22 secondary to progressive disease despite treatment with Zytiga.  Patient presented to the ED with complaints of upper back pain left chest pain for approximately 1 day with shortness of breath.  He reported it was worse with inspiration.  He was subsequently seen by the hospital service after evaluation the ER identified multiple PEs without evidence of right heart strain.  Patient was admitted and placed on heparin drip.  After assuming care discussed the case with oncology on-call, no acute intervention necessary.  Will transition to Lovenox 1 mg per kg twice daily with further evaluation by oncology for transition to an oral anticoagulant.  Patient has remained stable received education guarding injections.  Patient is stable will be discharged home today with outpatient follow-up as above  Discharge Diagnoses:  Principal Problem:   Acute pulmonary embolism ([place]) Active Problems:   Prostate cancer metastatic to multiple sites [facility])    Discharge Instructions  Discharge Instructions    Call MD for:   Complete by:  As directed    For any acute change in medical condition   Diet - low sodium heart healthy   Complete by:  As directed    Increase activity slowly   Complete by:  As directed      Allergies as of 05/18/2019       Reactions   Statins Other (See Comments)   Muscle pain      Medication List    TAKE these medications   acetaminophen 500 MG tablet Commonly known as:  TYLENOL Take 500 mg by mouth daily. Pt takes 5 days after chemo   CALCIUM 1000 + D PO Take 30 mLs by mouth daily. Calcium 1000 mg / Vit D 1000 iu   Claritin 10 MG Caps Generic drug:  Loratadine Take 10 mg by mouth daily. Patient takes every day for 5 days after chemo treatment.   cycloSPORINE 0.05 % ophthalmic emulsion Commonly known as:  RESTASIS Place 1 drop into both eyes 2 (two) times daily.   docusate sodium 100 MG capsule Commonly known as:  COLACE Take 100 mg by mouth 2 (two) times daily.   enoxaparin 80 MG/0.8ML injection Commonly known as:  LOVENOX Inject 0.8 mLs (80 mg total) into the skin 2 (two) times daily for 30 days.   ezetimibe 10 MG tablet Commonly known as:  ZETIA Take 10 mg by mouth every morning.   ibuprofen 200 MG tablet Commonly known as:  ADVIL Take 200 mg by mouth every 6 (six) hours as needed for moderate pain.   leuprolide 30 MG injection Commonly known as:  LUPRON Inject 45 mg into the muscle every 6 (six) months.   lidocaine-prilocaine cream Commonly known as:  EMLA Apply 1 application topically as needed.   ondansetron 4 MG tablet Commonly known as:  ZOFRAN Take 4 mg by mouth every 8 (eight) hours as needed for nausea or vomiting.   oxyCODONE 5 MG immediate release tablet  Commonly known as:  Oxy IR/ROXICODONE Take 1-2 tablets (5-10 mg total) by mouth every 4 (four) hours as needed for severe pain.   pantoprazole 40 MG tablet Commonly known as:  PROTONIX Take 40 mg by mouth daily after supper.   polyethylene glycol 17 g packet Commonly known as:  MIRALAX / GLYCOLAX Take 17 g by mouth daily as needed for mild constipation.   predniSONE 5 MG tablet Commonly known as:  DELTASONE TAKE 1 TABLET BY MOUTH 2 TIMES DAILY WITH A MEAL.   prochlorperazine 10 MG tablet Commonly known  as:  COMPAZINE Take 1 tablet (10 mg total) by mouth every 6 (six) hours as needed for nausea or vomiting. What changed:    how much to take  when to take this   tamsulosin 0.4 MG Caps capsule Commonly known as:  FLOMAX Take 0.4 mg by mouth daily.   traMADol 50 MG tablet Commonly known as:  ULTRAM TAKE 1 TABLET BY MOUTH EVERY 6 HOURS AS NEEDED FOR PAIN   triamcinolone cream 0.1 % Commonly known as:  KENALOG Apply 1 application topically daily as needed (dry skin in the winter).   Zytiga 250 MG tablet Generic drug:  abiraterone acetate TAKE 4 TABLETS (1,000 MG TOTAL) BY MOUTH DAILY. TAKE ON AN EMPTY STOMACH 1 HOUR BEFORE OR 2 HOURS AFTER A MEAL       Allergies  Allergen Reactions  . Statins Other (See Comments)    Muscle pain    Consultations:  case reviewed and discussed with Dr Irene Limbo   Procedures/Studies: Ct Chest W Contrast  Result Date: 04/23/2019 CLINICAL DATA:  Follow-up metastatic prostate cancer EXAM: CT CHEST, ABDOMEN, AND PELVIS WITH CONTRAST TECHNIQUE: Multidetector CT imaging of the chest, abdomen and pelvis was performed following the standard protocol during bolus administration of intravenous contrast. CONTRAST:  182mL OMNIPAQUE IOHEXOL 300 MG/ML SOLN, additional oral enteric contrast COMPARISON:  07/23/2018, MR lumbar spine, 01/25/2019 FINDINGS: CT CHEST FINDINGS Cardiovascular: Scattered coronary artery calcifications. Normal heart size. No pericardial effusion. Mediastinum/Nodes: There are newly enlarged mediastinal and right hilar lymph nodes, measuring up to 1.4 cm in short axis (series 3, image 27). Thyroid gland, trachea, and esophagus demonstrate no significant findings. Lungs/Pleura: Tiny centrilobular nodules, likely sequelae of infection or inflammation such as smoking-related respiratory bronchiolitis. Scattered ground-glass opacities of the right upper lobe (series 8, image 63). No pleural effusion or pneumothorax. Musculoskeletal: No chest wall mass  or suspicious bone lesions identified. CT ABDOMEN PELVIS FINDINGS Hepatobiliary: There is a new hypodense lesion of the right lobe of the liver measuring 6.0 x 5.3 cm (series 3, image 55). No gallstones, gallbladder wall thickening, or biliary dilatation. Pancreas: Unremarkable. No pancreatic ductal dilatation or surrounding inflammatory changes. Spleen: Normal in size without focal abnormality. Adrenals/Urinary Tract: Interval enlargement of a left adrenal mass, measuring 6.0 x 4.4 cm, previously 3.4 cm (series 3, image 56). Large exophytic cyst of the left kidney. No calculi or hydronephrosis. Bladder is unremarkable. Stomach/Bowel: Stomach is within normal limits. Appendix appears normal. No evidence of bowel wall thickening, distention, or inflammatory changes. Vascular/Lymphatic: Mixed calcific atherosclerosis. There are numerous new and enlarged bulky retroperitoneal and iliac lymph nodes, the largest matted lymph nodes of the right iliac chain measuring at least 2.5 cm (series 3, image 85). Reproductive: Evaluation of the pelvis is limited by bilateral hip total arthroplasty and associated streak artifact. Brachytherapy pellets are present prostate. Other: No abdominal wall hernia or abnormality. No abdominopelvic ascites. Musculoskeletal: Redemonstrated vertebral body fracture deformities of  L3 and L5. Increased sclerosis of an osseous metastasis of the left ischial ramus (series 3, image 127). IMPRESSION: 1. There are newly enlarged mediastinal and right hilar lymph nodes, a new hypodense lesion of the right lobe of the liver, interval enlargement of a left adrenal mass, and numerous new and enlarged bulky retroperitoneal and iliac lymph nodes, and increased sclerosis of an osseous metastasis of the left ischial ramus (series 3, image 127). findings consistent with worsening metastatic disease. 2. Scattered nonspecific infectious or inflammatory ground-glass opacities of the right upper lobe (series 8,  image 63). Attention on follow-up. 3.  Chronic and incidental findings as detailed above. Electronically Signed   By: Eddie Candle M.D.   On: 04/23/2019 16:09   Ct Angio Chest Pe W/cm &/or Wo Cm  Result Date: 05/17/2019 CLINICAL DATA:  Pleuritic chest pain beginning today. Currently undergoing treatment for prostate carcinoma. High pretest probability for pulmonary embolism. EXAM: CT ANGIOGRAPHY CHEST WITH CONTRAST TECHNIQUE: Multidetector CT imaging of the chest was performed using the standard protocol during bolus administration of intravenous contrast. Multiplanar CT image reconstructions and MIPs were obtained to evaluate the vascular anatomy. CONTRAST:  73mL OMNIPAQUE IOHEXOL 350 MG/ML SOLN COMPARISON:  04/23/2019 FINDINGS: Cardiovascular: Small volume pulmonary embolism is seen in the right lower lobar and segmental pulmonary arteries. No evidence of thoracic aortic dissection or aneurysm. Mediastinum/Nodes: Mild mediastinal and right hilar lymphadenopathy remains stable. Lungs/Pleura: No evidence of pulmonary infiltrate or mass. There has been near complete resolution of ground-glass opacities in the right upper lobe since prior study. Tiny right pleural effusion is noted. Upper abdomen: Large mass measuring approximately 6 cm is again seen in the posterior right hepatic lobe. Large left adrenal mass measuring approximately 5 mm is again demonstrated. These findings are consistent with metastatic disease. Musculoskeletal: No suspicious bone lesions identified. Review of the MIP images confirms the above findings. IMPRESSION: Small volume pulmonary embolism in right lower lobar and segmental pulmonary arteries. Stable mild mediastinal and right hilar lymphadenopathy, suspicious for metastatic disease. Stable left adrenal and right hepatic lobe masses, consistent with metastatic disease. Critical Value/emergent results were called by telephone at the time of interpretation on 05/17/2019 at 10:38 pm to Dr.  Lajean Saver , who verbally acknowledged these results. Electronically Signed   By: Earle Gell M.D.   On: 05/17/2019 22:41   Mr Lumbar Spine W Wo Contrast  Result Date: 04/26/2019 CLINICAL DATA:  Metastatic prostate cancer. Decompressive laminectomy in tumor resection at L3 in August of last year. Stereotactic radio surgery of metastatic disease at L1 and L3 completed in November of last year. Difficulty walking and balance disturbance. EXAM: MRI LUMBAR SPINE WITHOUT AND WITH CONTRAST TECHNIQUE: Multiplanar and multiecho pulse sequences of the lumbar spine were obtained without and with intravenous contrast. CONTRAST:  41mL MULTIHANCE GADOBENATE DIMEGLUMINE 529 MG/ML IV SOLN COMPARISON:  01/25/2019.  CT 04/23/2019. FINDINGS: Segmentation: 5 lumbar type vertebral bodies as numbered previously. Alignment:  Straightening of the normal lumbar lordosis. Vertebrae:  Inferior T11 and T12 remain negative. Treated metastatic lesion within the right posterior corner of the L1 vertebral body with early extension into the right pedicle measures 1 mm larger than on the previous exam. Tumor shows minimal involvement of the superior foramen on the right without apparent compression of the right L1 nerve. No tumor seen within the L2 vertebral body. Pathologic fracture of L3 appears the same. Loss of height of 80% centrally. 1 cm of posterior bulging of the posterosuperior margin of the vertebral  body. Previous posterior decompression at this level. New regional marrow space lesion in the anterior portion of the L4 vertebral body, measuring up to 2.5 cm in size. This is consistent with a new and rapidly progressive metastatic lesion. L5 shows an old benign superior endplate fracture. No sign of tumor affecting the L5 vertebral body. S1 and S2 are negative for tumor. Conus medullaris and cauda equina: Conus extends to the L1 level. Conus and cauda equina appear normal. Paraspinal and other soft tissues: Progressive  retroperitoneal adenopathy. Retroperitoneal lymph node anterior to the IVC at the L4 level now measures 2.9 cm in length compared with 1.8 cm in February. Disc levels: L1-2: Chronic disc bulge without compressive stenosis. L2-3: Previous posterior decompression on the left. 1 cm posterior bulging of the posterosuperior margin of the L3 vertebral body as noted above. This indents the thecal sac. Narrowing of the right lateral recess without distinct neural compression. L3-4: Bulging of the disc. Facet and ligamentous hypertrophy. Mild stenosis of both lateral recesses and foramina. L4-5: Bulging of the disc. Mild facet and ligamentous hypertrophy. Mild bilateral lateral recess and foraminal narrowing without distinct neural compression. L5-S1: No disc abnormality. Minimal facet hypertrophy. No stenosis. IMPRESSION: Treated lesion in the right posterior L1 vertebral body and pedicle is 1 mm larger, nearly stable. Very early involvement of the superior foramen on the right without gross compression of the exiting right L1 nerve. Treated pathologic fracture at L3 without evidence of progressive disease. Previous posterior decompression on the left. 1 cm posterior bulging of the posterosuperior margin of the L3 vertebral body is unchanged. New metastatic lesion within the anterior L4 vertebral body measuring up to 2.5 cm in diameter. Stable benign superior endplate compression fracture at L5. Considerable progression of retroperitoneal lymphadenopathy since February. Electronically Signed   By: Nelson Chimes M.D.   On: 04/26/2019 11:05   Ct Abdomen Pelvis W Contrast  Result Date: 04/23/2019 CLINICAL DATA:  Follow-up metastatic prostate cancer EXAM: CT CHEST, ABDOMEN, AND PELVIS WITH CONTRAST TECHNIQUE: Multidetector CT imaging of the chest, abdomen and pelvis was performed following the standard protocol during bolus administration of intravenous contrast. CONTRAST:  157mL OMNIPAQUE IOHEXOL 300 MG/ML SOLN, additional  oral enteric contrast COMPARISON:  07/23/2018, MR lumbar spine, 01/25/2019 FINDINGS: CT CHEST FINDINGS Cardiovascular: Scattered coronary artery calcifications. Normal heart size. No pericardial effusion. Mediastinum/Nodes: There are newly enlarged mediastinal and right hilar lymph nodes, measuring up to 1.4 cm in short axis (series 3, image 27). Thyroid gland, trachea, and esophagus demonstrate no significant findings. Lungs/Pleura: Tiny centrilobular nodules, likely sequelae of infection or inflammation such as smoking-related respiratory bronchiolitis. Scattered ground-glass opacities of the right upper lobe (series 8, image 63). No pleural effusion or pneumothorax. Musculoskeletal: No chest wall mass or suspicious bone lesions identified. CT ABDOMEN PELVIS FINDINGS Hepatobiliary: There is a new hypodense lesion of the right lobe of the liver measuring 6.0 x 5.3 cm (series 3, image 55). No gallstones, gallbladder wall thickening, or biliary dilatation. Pancreas: Unremarkable. No pancreatic ductal dilatation or surrounding inflammatory changes. Spleen: Normal in size without focal abnormality. Adrenals/Urinary Tract: Interval enlargement of a left adrenal mass, measuring 6.0 x 4.4 cm, previously 3.4 cm (series 3, image 56). Large exophytic cyst of the left kidney. No calculi or hydronephrosis. Bladder is unremarkable. Stomach/Bowel: Stomach is within normal limits. Appendix appears normal. No evidence of bowel wall thickening, distention, or inflammatory changes. Vascular/Lymphatic: Mixed calcific atherosclerosis. There are numerous new and enlarged bulky retroperitoneal and iliac lymph nodes, the largest  matted lymph nodes of the right iliac chain measuring at least 2.5 cm (series 3, image 85). Reproductive: Evaluation of the pelvis is limited by bilateral hip total arthroplasty and associated streak artifact. Brachytherapy pellets are present prostate. Other: No abdominal wall hernia or abnormality. No  abdominopelvic ascites. Musculoskeletal: Redemonstrated vertebral body fracture deformities of L3 and L5. Increased sclerosis of an osseous metastasis of the left ischial ramus (series 3, image 127). IMPRESSION: 1. There are newly enlarged mediastinal and right hilar lymph nodes, a new hypodense lesion of the right lobe of the liver, interval enlargement of a left adrenal mass, and numerous new and enlarged bulky retroperitoneal and iliac lymph nodes, and increased sclerosis of an osseous metastasis of the left ischial ramus (series 3, image 127). findings consistent with worsening metastatic disease. 2. Scattered nonspecific infectious or inflammatory ground-glass opacities of the right upper lobe (series 8, image 63). Attention on follow-up. 3.  Chronic and incidental findings as detailed above. Electronically Signed   By: Eddie Candle M.D.   On: 04/23/2019 16:09   Dg Chest Port 1 View  Result Date: 05/17/2019 CLINICAL DATA:  Fever EXAM: PORTABLE CHEST 1 VIEW COMPARISON:  None. FINDINGS: The heart size and mediastinal contours are within normal limits. Both lungs are clear. The visualized skeletal structures are unremarkable. There is a right chest wall power-injectable Port-A-Cath with tip in the proximal right atrium via a right internal jugular vein approach. IMPRESSION: No active disease. Electronically Signed   By: Ulyses Jarred M.D.   On: 05/17/2019 21:24   Ir Imaging Guided Port Insertion  Result Date: 05/01/2019 INDICATION: 78 year old with prostate cancer.  Port-A-Cath needed for therapy. EXAM: FLUOROSCOPIC AND ULTRASOUND GUIDED PLACEMENT OF A SUBCUTANEOUS PORT COMPARISON:  None. MEDICATIONS: Ancef 2 g; The antibiotic was administered within an appropriate time interval prior to skin puncture. ANESTHESIA/SEDATION: Versed 2.0 mg IV; Fentanyl 100 mcg IV; Moderate Sedation Time:  26 minutes The patient was continuously monitored during the procedure by the interventional radiology nurse under my  direct supervision. FLUOROSCOPY TIME:  18 seconds, 3 mGy COMPLICATIONS: None immediate. PROCEDURE: The procedure, risks, benefits, and alternatives were explained to the patient. Questions regarding the procedure were encouraged and answered. The patient understands and consents to the procedure. Patient was placed supine on the interventional table. Ultrasound confirmed a patent right internal jugular vein. The right chest and neck were cleaned with a skin antiseptic and a sterile drape was placed. Maximal barrier sterile technique was utilized including caps, mask, sterile gowns, sterile gloves, sterile drape, hand hygiene and skin antiseptic. The right neck was anesthetized with 1% lidocaine. Small incision was made in the right neck with a blade. Micropuncture set was placed in the right internal jugular vein with ultrasound guidance. The micropuncture wire was used for measurement purposes. The right chest was anesthetized with 1% lidocaine with epinephrine. #15 blade was used to make an incision and a subcutaneous port pocket was formed. Bentley was assembled. Subcutaneous tunnel was formed with a stiff tunneling device. The port catheter was brought through the subcutaneous tunnel. The port was placed in the subcutaneous pocket. The micropuncture set was exchanged for a peel-away sheath. The catheter was placed through the peel-away sheath and the tip was positioned at the SVC and right atrium junction. Catheter placement was confirmed with fluoroscopy. The port was accessed and flushed with heparinized saline. The port pocket was closed using two layers of absorbable sutures and Dermabond. The vein skin site was closed using  a single layer of absorbable suture and Dermabond. Sterile dressings were applied. Patient tolerated the procedure well without an immediate complication. Ultrasound and fluoroscopic images were taken and saved for this procedure. IMPRESSION: Placement of a subcutaneous port  device. Catheter tip at the SVC and right atrium junction. Electronically Signed   By: Markus Daft M.D.   On: 05/01/2019 10:16   Vas [place] Lower Extremity Venous (dvt)  Result Date: 05/18/2019  Lower Venous Study Indications: Pulmonary embolism.  Performing Technologist: Abram Sander RVS  Examination Guidelines: A complete evaluation includes B-mode imaging, spectral Doppler, color Doppler, and power Doppler as needed of all accessible portions of each vessel. Bilateral testing is considered an integral part of a complete examination. Limited examinations for reoccurring indications may be performed as noted.  +---------+---------------+---------+-----------+---------------+--------------+ RIGHT    CompressibilityPhasicitySpontaneityProperties     Summary        +---------+---------------+---------+-----------+---------------+--------------+ CFV      Partial        Yes      Yes        partially      Acute                                                      re-cannalized                 +---------+---------------+---------+-----------+---------------+--------------+ SFJ      Full                                                             +---------+---------------+---------+-----------+---------------+--------------+ FV Prox  None                                                             +---------+---------------+---------+-----------+---------------+--------------+ FV Mid   Full                                                             +---------+---------------+---------+-----------+---------------+--------------+ FV DistalFull                                                             +---------+---------------+---------+-----------+---------------+--------------+ PFV      Full                                                             +---------+---------------+---------+-----------+---------------+--------------+ POP      Full  Yes       Yes                                      +---------+---------------+---------+-----------+---------------+--------------+ PTV      Full                                                             +---------+---------------+---------+-----------+---------------+--------------+ PERO                                                       Not visualized +---------+---------------+---------+-----------+---------------+--------------+   +---------+---------------+---------+-----------+----------+--------------+ LEFT     CompressibilityPhasicitySpontaneityPropertiesSummary        +---------+---------------+---------+-----------+----------+--------------+ CFV      Full           Yes      Yes                                 +---------+---------------+---------+-----------+----------+--------------+ SFJ      Full                                                        +---------+---------------+---------+-----------+----------+--------------+ FV Prox  Full                                                        +---------+---------------+---------+-----------+----------+--------------+ FV Mid   Full                                                        +---------+---------------+---------+-----------+----------+--------------+ FV DistalFull                                                        +---------+---------------+---------+-----------+----------+--------------+ PFV      Full                                                        +---------+---------------+---------+-----------+----------+--------------+ POP      Full           Yes      Yes                                 +---------+---------------+---------+-----------+----------+--------------+  PTV      Full                                                        +---------+---------------+---------+-----------+----------+--------------+ PERO                                                   Not visualized +---------+---------------+---------+-----------+----------+--------------+     Summary: Right: Findings consistent with acute deep vein thrombosis involving the right common femoral vein, and right femoral vein. No cystic structure found in the popliteal fossa. Left: There is no evidence of deep vein thrombosis in the lower extremity. No cystic structure found in the popliteal fossa.  *See table(s) above for measurements and observations.    Preliminary        Subjective: Patient reports he feels fine no shortness of breath resting comfortably in bed.  Discharge Exam: Vitals:   05/18/19 0026 05/18/19 0437  BP: (!) 137/93 131/65  Pulse: (!) 117 91  Resp: 14 18  Temp: 99 F (37.2 C) 98.8 F (37.1 C)  SpO2: 97% 95%   Vitals:   05/17/19 2326 05/17/19 2345 05/18/19 0026 05/18/19 0437  BP:  129/74 (!) 137/93 131/65  Pulse:  97 (!) 117 91  Resp:  18 14 18   Temp: 99.5 F (37.5 C)  99 F (37.2 C) 98.8 F (37.1 C)  TempSrc: Oral  Oral Oral  SpO2:  95% 97% 95%  Weight:    84.6 kg    General: Pt is alert, awake, not in acute distress Cardiovascular: RRR, S1/S2 +, no rubs, no gallops Respiratory: CTA bilaterally, no wheezing, no rhonchi Abdominal: Soft, NT, ND, bowel sounds + Extremities: no edema, no cyanosis    The results of significant diagnostics from this hospitalization (including imaging, microbiology, ancillary and laboratory) are listed below for reference.     Microbiology: Recent Results (from the past 240 hour(s))  SARS Coronavirus 2 (CEPHEID - Performed in [place] hospital lab), Hosp Order     Status: None   Collection Time: 05/17/19  8:44 PM  Result Value Ref Range Status   SARS Coronavirus 2 NEGATIVE NEGATIVE Final    Comment: (NOTE) If result is NEGATIVE SARS-CoV-2 target nucleic acids are NOT DETECTED. The SARS-CoV-2 RNA is generally detectable in upper and lower  respiratory specimens during the acute phase of infection. The  lowest  concentration of SARS-CoV-2 viral copies this assay can detect is 250  copies / mL. A negative result does not preclude SARS-CoV-2 infection  and should not be used as the sole basis for treatment or other  patient management decisions.  A negative result may occur with  improper specimen collection / handling, submission of specimen other  than nasopharyngeal swab, presence of viral mutation(s) within the  areas targeted by this assay, and inadequate number of viral copies  (<250 copies / mL). A negative result must be combined with clinical  observations, patient history, and epidemiological information. If result is POSITIVE SARS-CoV-2 target nucleic acids are DETECTED. The SARS-CoV-2 RNA is generally detectable in upper and lower  respiratory specimens dur ing the acute phase of infection.  Positive  results are indicative of active infection  with SARS-CoV-2.  Clinical  correlation with patient history and other diagnostic information is  necessary to determine patient infection status.  Positive results do  not rule out bacterial infection or co-infection with other viruses. If result is PRESUMPTIVE POSTIVE SARS-CoV-2 nucleic acids MAY BE PRESENT.   A presumptive positive result was obtained on the submitted specimen  and confirmed on repeat testing.  While 2019 novel coronavirus  (SARS-CoV-2) nucleic acids may be present in the submitted sample  additional confirmatory testing may be necessary for epidemiological  and / or clinical management purposes  to differentiate between  SARS-CoV-2 and other Sarbecovirus currently known to infect humans.  If clinically indicated additional testing with an alternate test  methodology [phone]) is advised. The SARS-CoV-2 RNA is generally  detectable in upper and lower respiratory sp ecimens during the acute  phase of infection. The expected result is Negative. Fact Sheet for Patients:   StrictlyIdeas.no Fact Sheet for Healthcare Providers: BankingDealers.co.za This test is not yet approved or cleared by the [place] FDA and has been authorized for detection and/or diagnosis of SARS-CoV-2 by FDA under an Emergency Use Authorization (EUA).  This EUA will remain in effect (meaning this test can be used) for the duration of the COVID-19 declaration under Section 564(b)(1) of the Act, 21 U.S.C. section 360bbb-3(b)(1), unless the authorization is terminated or revoked sooner. Performed at [facility], [place] [address] [place], [place] [address]      Labs: BNP (last 3 results) Recent Labs    08/16/18 0951  BNP 163.8*   Basic Metabolic Panel: Recent Labs  Lab 05/17/19 2043 05/18/19 0433  NA 136 136  K 4.1 4.2  CL 103 104  CO2 23 23  GLUCOSE 128* 104*  BUN 10 7*  CREATININE 0.72 0.69  CALCIUM 8.6* 8.0*   Liver Function Tests: Recent Labs  Lab 05/17/19 2043  AST 20  ALT 13  ALKPHOS 82  BILITOT 0.7  PROT 5.2*  ALBUMIN 2.8*   No results for input(s): LIPASE, AMYLASE in the last 168 hours. No results for input(s): AMMONIA in the last 168 hours. CBC: Recent Labs  Lab 05/17/19 2043 05/18/19 0433  WBC 4.1 6.5  NEUTROABS 2.4  --   HGB 10.5* 9.6*  HCT 33.2* 29.8*  MCV 93.5 92.5  PLT 233 218   Cardiac Enzymes: Recent Labs  Lab 05/17/19 2257  TROPONINI <0.03   BNP: Invalid input(s): POCBNP CBG: No results for input(s): GLUCAP in the last 168 hours. D-Dimer No results for input(s): DDIMER in the last 72 hours. Hgb A1c No results for input(s): HGBA1C in the last 72 hours. Lipid Profile No results for input(s): CHOL, HDL, LDLCALC, TRIG, CHOLHDL, LDLDIRECT in the last 72 hours. Thyroid function studies No results for input(s): TSH, T4TOTAL, T3FREE, THYROIDAB in the last 72 hours.  Invalid input(s): FREET3 Anemia work up No results for input(s): VITAMINB12, FOLATE, FERRITIN, TIBC, IRON,  RETICCTPCT in the last 72 hours. Urinalysis    Component Value Date/Time   COLORURINE YELLOW 05/17/2019 2247   [place] 05/17/2019 2247   LABSPEC 1.030 05/17/2019 2247   PHURINE 7.0 05/17/2019 2247   [place] 05/17/2019 2247   [place] 05/17/2019 2247   [place] 05/17/2019 [place] 05/17/2019 2247   PROTEINUR NEGATIVE 05/17/2019 2247   UROBILINOGEN 0.2 01/22/2015 0939   NITRITE NEGATIVE 05/17/2019 2247   [place] 05/17/2019 2247   Sepsis Labs Invalid input(s): PROCALCITONIN,  WBC,  LACTICIDVEN Microbiology Recent  Results (from the past 240 hour(s))  SARS Coronavirus 2 (CEPHEID - Performed in [place] hospital lab), Hosp Order     Status: None   Collection Time: 05/17/19  8:44 PM  Result Value Ref Range Status   SARS Coronavirus 2 NEGATIVE NEGATIVE Final    Comment: (NOTE) If result is NEGATIVE SARS-CoV-2 target nucleic acids are NOT DETECTED. The SARS-CoV-2 RNA is generally detectable in upper and lower  respiratory specimens during the acute phase of infection. The lowest  concentration of SARS-CoV-2 viral copies this assay can detect is 250  copies / mL. A negative result does not preclude SARS-CoV-2 infection  and should not be used as the sole basis for treatment or other  patient management decisions.  A negative result may occur with  improper specimen collection / handling, submission of specimen other  than nasopharyngeal swab, presence of viral mutation(s) within the  areas targeted by this assay, and inadequate number of viral copies  (<250 copies / mL). A negative result must be combined with clinical  observations, patient history, and epidemiological information. If result is POSITIVE SARS-CoV-2 target nucleic acids are DETECTED. The SARS-CoV-2 RNA is generally detectable in upper and lower  respiratory specimens dur ing the acute phase of infection.  Positive  results are indicative of  active infection with SARS-CoV-2.  Clinical  correlation with patient history and other diagnostic information is  necessary to determine patient infection status.  Positive results do  not rule out bacterial infection or co-infection with other viruses. If result is PRESUMPTIVE POSTIVE SARS-CoV-2 nucleic acids MAY BE PRESENT.   A presumptive positive result was obtained on the submitted specimen  and confirmed on repeat testing.  While 2019 novel coronavirus  (SARS-CoV-2) nucleic acids may be present in the submitted sample  additional confirmatory testing may be necessary for epidemiological  and / or clinical management purposes  to differentiate between  SARS-CoV-2 and other Sarbecovirus currently known to infect humans.  If clinically indicated additional testing with an alternate test  methodology [phone]) is advised. The SARS-CoV-2 RNA is generally  detectable in upper and lower respiratory sp ecimens during the acute  phase of infection. The expected result is Negative. Fact Sheet for Patients:  StrictlyIdeas.no Fact Sheet for Healthcare Providers: BankingDealers.co.za This test is not yet approved or cleared by the [place] FDA and has been authorized for detection and/or diagnosis of SARS-CoV-2 by FDA under an Emergency Use Authorization (EUA).  This EUA will remain in effect (meaning this test can be used) for the duration of the COVID-19 declaration under Section 564(b)(1) of the Act, 21 U.S.C. section 360bbb-3(b)(1), unless the authorization is terminated or revoked sooner. Performed at [facility], [place] [address] [place], [place] [address]      Time coordinating discharge: 35 minutes  SIGNED:   Nicolette Bang, MD  [facility] 05/18/2019, 12:33 PM Pager   If 7PM-7AM, please contact night-coverage [email or web address] Password [email or web address]

## 2019-05-18 NOTE — Progress Notes (Signed)
  Echocardiogram 2D Echocardiogram has been performed.  [name] G [name] 05/18/2019, 1:14 PM

## 2019-05-20 LAB — PATHOLOGIST SMEAR REVIEW

## 2019-05-21 NOTE — Addendum Note (Signed)
Encounter addended by: Kary Kos, MD on: 05/21/2019 2:42 PM  Actions taken: Clinical Note Signed

## 2019-05-21 NOTE — Procedures (Signed)
Name: [name]  MRN: [id number]  Date: 05/10/2019   DOB: Jun 01, 1941  Stereotactic Radiosurgery Operative Note  PRE-OPERATIVE DIAGNOSIS:  Spinal Metastasis  POST-OPERATIVE DIAGNOSIS:  Spinal Metastasis  PROCEDURE:  Stereotactic Radiosurgery  SURGEON:  Jerrid Forgette P, MD  NARRATIVE: The patient underwent a radiation treatment planning session in the radiation oncology simulation suite under the care of the radiation oncology physician and physicist.  I participated closely in the radiation treatment planning afterwards. The patient underwent planning CT myelogram which was fused to the MRI.  These images were fused on the planning system.  Radiation oncology contoured the gross target volume and subsequently expanded this to yield the Planning Target Volume. I actively participated in the planning process.  I helped to define and review the target contours and also the contours of the spinal cord, and selected nearby organs at risk.  All the dose constraints for critical structures were reviewed and compared to AAPM Task Group 101.  The prescription dose conformity was reviewed.  I approved the plan electronically.    Accordingly, [name] was brought to the TrueBeam stereotactic radiation treatment linac and placed in the custom immobilization device.  The patient was aligned according to the IR fiducial markers with BrainLab Exactrac, then orthogonal x-rays were used in ExacTrac with the 6DOF robotic table and the shifts were made to align the patient.  Then conebeam CT was performed to verify precision.  [name] received stereotactic radiosurgery uneventfully.  The detailed description of the procedure is recorded in the radiation oncology procedure note.  I was present for the duration of the procedure.  DISPOSITION:  Following delivery, the patient was transported to nursing in stable condition and monitored for possible acute effects to be discharged to home in stable  condition with follow-up in one month.  Adena Sima P, MD 05/21/2019 2:26 PM

## 2019-05-22 ENCOUNTER — Telehealth: Payer: Self-pay

## 2019-05-22 LAB — CULTURE, BLOOD (ROUTINE X 2)
Culture: NO GROWTH
Culture: NO GROWTH

## 2019-05-22 NOTE — Telephone Encounter (Signed)
Received call from patient spouse stating that the patient was treated for PE and sent home with Lovenox injections for the next 30 days and she wanted to make sure that Dr. [name] was aware. Explained that will make sure he is aware and that he will address with the patient on his next visit on 6/11. She verbalized understanding and had no other questions or concerns.

## 2019-05-30 ENCOUNTER — Other Ambulatory Visit: Payer: Self-pay | Admitting: Pharmacist

## 2019-05-30 ENCOUNTER — Inpatient Hospital Stay: Payer: Medicare Other | Attending: Oncology

## 2019-05-30 ENCOUNTER — Inpatient Hospital Stay: Payer: Medicare Other

## 2019-05-30 ENCOUNTER — Inpatient Hospital Stay (HOSPITAL_BASED_OUTPATIENT_CLINIC_OR_DEPARTMENT_OTHER): Payer: Medicare Other | Admitting: Oncology

## 2019-05-30 ENCOUNTER — Encounter: Payer: Self-pay | Admitting: Oncology

## 2019-05-30 ENCOUNTER — Other Ambulatory Visit: Payer: Self-pay

## 2019-05-30 VITALS — BP 129/72 | HR 81 | Temp 98.3°F | Resp 18 | Ht 71.0 in | Wt 184.7 lb

## 2019-05-30 DIAGNOSIS — Z791 Long term (current) use of non-steroidal anti-inflammatories (NSAID): Secondary | ICD-10-CM | POA: Insufficient documentation

## 2019-05-30 DIAGNOSIS — C61 Malignant neoplasm of prostate: Secondary | ICD-10-CM | POA: Insufficient documentation

## 2019-05-30 DIAGNOSIS — I2699 Other pulmonary embolism without acute cor pulmonale: Secondary | ICD-10-CM

## 2019-05-30 DIAGNOSIS — Z79899 Other long term (current) drug therapy: Secondary | ICD-10-CM | POA: Insufficient documentation

## 2019-05-30 DIAGNOSIS — C7951 Secondary malignant neoplasm of bone: Secondary | ICD-10-CM

## 2019-05-30 DIAGNOSIS — C787 Secondary malignant neoplasm of liver and intrahepatic bile duct: Secondary | ICD-10-CM | POA: Diagnosis not present

## 2019-05-30 DIAGNOSIS — Z5111 Encounter for antineoplastic chemotherapy: Secondary | ICD-10-CM | POA: Diagnosis not present

## 2019-05-30 DIAGNOSIS — Z7901 Long term (current) use of anticoagulants: Secondary | ICD-10-CM | POA: Insufficient documentation

## 2019-05-30 LAB — CBC WITH DIFFERENTIAL (CANCER CENTER ONLY)
Abs Immature Granulocytes: 0.05 10*3/uL (ref 0.00–0.07)
Basophils Absolute: 0.1 10*3/uL (ref 0.0–0.1)
Basophils Relative: 2 %
Eosinophils Absolute: 0 10*3/uL (ref 0.0–0.5)
Eosinophils Relative: 0 %
HCT: 32.5 % — ABNORMAL LOW (ref 39.0–52.0)
Hemoglobin: 10.4 g/dL — ABNORMAL LOW (ref 13.0–17.0)
Immature Granulocytes: 1 %
Lymphocytes Relative: 7 %
Lymphs Abs: 0.5 10*3/uL — ABNORMAL LOW (ref 0.7–4.0)
MCH: 30.1 pg (ref 26.0–34.0)
MCHC: 32 g/dL (ref 30.0–36.0)
MCV: 94.2 fL (ref 80.0–100.0)
Monocytes Absolute: 0.9 10*3/uL (ref 0.1–1.0)
Monocytes Relative: 11 %
Neutro Abs: 6.4 10*3/uL (ref 1.7–7.7)
Neutrophils Relative %: 79 %
Platelet Count: 390 10*3/uL (ref 150–400)
RBC: 3.45 MIL/uL — ABNORMAL LOW (ref 4.22–5.81)
RDW: 16.8 % — ABNORMAL HIGH (ref 11.5–15.5)
WBC Count: 8 10*3/uL (ref 4.0–10.5)
nRBC: 0 % (ref 0.0–0.2)

## 2019-05-30 LAB — CMP (CANCER CENTER ONLY)
ALT: 9 U/L (ref 0–44)
AST: 14 U/L — ABNORMAL LOW (ref 15–41)
Albumin: 2.9 g/dL — ABNORMAL LOW (ref 3.5–5.0)
Alkaline Phosphatase: 88 U/L (ref 38–126)
Anion gap: 8 (ref 5–15)
BUN: 13 mg/dL (ref 8–23)
CO2: 22 mmol/L (ref 22–32)
Calcium: 8.6 mg/dL — ABNORMAL LOW (ref 8.9–10.3)
Chloride: 107 mmol/L (ref 98–111)
Creatinine: 0.69 mg/dL (ref 0.61–1.24)
GFR, Est AFR Am: >60 mL/min (ref >60)
GFR, Estimated: >60 mL/min (ref >60)
Glucose, Bld: 114 mg/dL — ABNORMAL HIGH (ref 70–99)
Potassium: 3.7 mmol/L (ref 3.5–5.1)
Sodium: 137 mmol/L (ref 135–145)
Total Bilirubin: 0.2 mg/dL — ABNORMAL LOW (ref 0.3–1.2)
Total Protein: 5.8 g/dL — ABNORMAL LOW (ref 6.5–8.1)

## 2019-05-30 MED ORDER — SODIUM CHLORIDE 0.9% FLUSH
10.0000 mL | INTRAVENOUS | Status: DC | PRN
  Administered 2019-05-30: 10 mL
  Filled 2019-05-30: qty 10

## 2019-05-30 MED ORDER — PEGFILGRASTIM 6 MG/0.6ML ~~LOC~~ PSKT
PREFILLED_SYRINGE | SUBCUTANEOUS | Status: AC
  Filled 2019-05-30: qty 0.6

## 2019-05-30 MED ORDER — DEXAMETHASONE SODIUM PHOSPHATE 10 MG/ML IJ SOLN
10.0000 mg | Freq: Once | INTRAMUSCULAR | Status: AC
  Administered 2019-05-30: 10:00:00 10 mg via INTRAVENOUS

## 2019-05-30 MED ORDER — FAMOTIDINE IN NACL 20-0.9 MG/50ML-% IV SOLN
INTRAVENOUS | Status: AC
  Filled 2019-05-30: qty 50

## 2019-05-30 MED ORDER — SODIUM CHLORIDE 0.9 % IV SOLN
20.0000 mg/m2 | Freq: Once | INTRAVENOUS | Status: AC
  Administered 2019-05-30: 11:00:00 42 mg via INTRAVENOUS
  Filled 2019-05-30: qty 4.2

## 2019-05-30 MED ORDER — DEXTROSE 5 % IV SOLN: 20.0000 mg/m2 | Freq: Once | INTRAVENOUS | Status: DC

## 2019-05-30 MED ORDER — SODIUM CHLORIDE 0.9 % IV SOLN
Freq: Once | INTRAVENOUS | Status: AC
  Administered 2019-05-30: 10:00:00 via INTRAVENOUS
  Filled 2019-05-30: qty 250

## 2019-05-30 MED ORDER — DEXAMETHASONE SODIUM PHOSPHATE 10 MG/ML IJ SOLN
INTRAMUSCULAR | Status: AC
  Filled 2019-05-30: qty 1

## 2019-05-30 MED ORDER — HEPARIN SOD (PORK) LOCK FLUSH 100 UNIT/ML IV SOLN
500.0000 [IU] | Freq: Once | INTRAVENOUS | Status: AC | PRN
  Administered 2019-05-30: 500 [IU]
  Filled 2019-05-30: qty 5

## 2019-05-30 MED ORDER — PEGFILGRASTIM 6 MG/0.6ML ~~LOC~~ PSKT
6.0000 mg | PREFILLED_SYRINGE | Freq: Once | SUBCUTANEOUS | Status: AC
  Administered 2019-05-30: 12:00:00 6 mg via SUBCUTANEOUS

## 2019-05-30 MED ORDER — DIPHENHYDRAMINE HCL 50 MG/ML IJ SOLN
25.0000 mg | Freq: Once | INTRAMUSCULAR | Status: AC
  Administered 2019-05-30: 25 mg via INTRAVENOUS

## 2019-05-30 MED ORDER — FAMOTIDINE IN NACL 20-0.9 MG/50ML-% IV SOLN
20.0000 mg | Freq: Once | INTRAVENOUS | Status: AC
  Administered 2019-05-30: 20 mg via INTRAVENOUS

## 2019-05-30 MED ORDER — DIPHENHYDRAMINE HCL 50 MG/ML IJ SOLN
INTRAMUSCULAR | Status: AC
  Filled 2019-05-30: qty 1

## 2019-05-30 NOTE — Patient Instructions (Signed)
[facility] Discharge Instructions for Patients Receiving Chemotherapy  Today you received the following chemotherapy agents: [name]  To help prevent nausea and vomiting after your treatment, we encourage you to take your nausea medication as directed.   If you develop nausea and vomiting that is not controlled by your nausea medication, call the clinic.   BELOW ARE SYMPTOMS THAT SHOULD BE REPORTED IMMEDIATELY:  *FEVER GREATER THAN 100.5 F  *CHILLS WITH OR WITHOUT FEVER  NAUSEA AND VOMITING THAT IS NOT CONTROLLED WITH YOUR NAUSEA MEDICATION  *UNUSUAL SHORTNESS OF BREATH  *UNUSUAL BRUISING OR BLEEDING  TENDERNESS IN MOUTH AND THROAT WITH OR WITHOUT PRESENCE OF ULCERS  *URINARY PROBLEMS  *BOWEL PROBLEMS  UNUSUAL RASH Items with * indicate a potential emergency and should be followed up as soon as possible.  Feel free to call the clinic should you have any questions or concerns. The clinic phone number is [phone].  Please show the CHEMO ALERT CARD at check-in to the Emergency Department and triage nurse.   

## 2019-05-30 NOTE — Progress Notes (Signed)
Hematology and Oncology Follow Up Visit  [name] [id number] [birth year] 78 y.o. 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  He was diagnosed in 2017 with Gleason score of 5+4 = 9 and a PSA of 13.56.   Prior Therapy: He was treated there with external beam radiation and brachii therapy in January 2017 and completed 2 years of androgen deprivation.  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  Metastatic work-up at that time showed bone involvement as well as lymphadenopathy.    He was treated under the care of Dr. [name] at [facility] initially with enzalutamide started in May 2018 until January 2019 where he presented with progression of disease with bony metastasis, adrenal nodule as well as worsening adenopathy.    Taxotere chemotherapy initially at 60 mg/m and that was reduced to 45 mg/m after cycle 6.  His last treatment was in July 2019 given at [facility].  Repeat imaging studies showed his lymphadenopathy has improved but he did develop worsening bony metastasis based on a lumbar spine obtained on June 24, 2018.  The MRI showed a complete replacement of L3 vertebral body with tumor the tumor in the ventral epidural space behind L3 greater to the left.  Bulky retroperitoneal adenopathy was slightly improved as mentioned.    He is status post decompressive laminectomy and removal of L3 tumor completed on 08/06/2018.  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   Zytiga 1000 mg daily started in September 2019.  Therapy discontinued in May 2020 for progression of disease.  He is status post stereotactic radiosurgery to the L4 vertebral body completed on  May 10, 2019.  Current therapy:  [name] chemotherapy 20 mg per metered square started on May 10, 2019.  He is here for cycle 2 of therapy.  Androgen deprivation under the care of Dr. [name].   Xgeva 120 mg every 6 weeks.  Last injection given on May 10, 2019.  Interim History: Mr. Jablonsky is here for a repeat evaluation.  Since the last visit, he received the first cycle of chemotherapy required hospitalization on May 29 of 2020 after he was found to have a pulmonary embolism and presented with chest pain or shortness of breath.  He was found to have a small volume PE in the right lower lobe and was started on Lovenox.  Since his discharge, he reports feeling reasonably well without any major complaints.  He is no longer reporting any chest pain or shortness of breath.  He continues to have persistent right lower extremity swelling in his calf predominantly where he had a DVT.  He denies any complications related to chemotherapy including nausea, vomiting or diarrhea.  He denied any alteration mental status, neuropathy, confusion or dizziness.  Denies any headaches or lethargy.  Denies any night sweats, weight loss or changes in appetite.  Denied orthopnea, dyspnea on exertion or chest discomfort.  Denies shortness of breath, difficulty breathing hemoptysis or cough.  Denies any abdominal distention, nausea, early satiety or dyspepsia.  Denies any hematuria, frequency, dysuria or nocturia.  Denies any skin irritation, dryness or rash.  Denies any ecchymosis or petechiae.  Denies any lymphadenopathy or clotting.  Denies any heat or cold  intolerance.  Denies any anxiety or depression.  Remaining review of system is negative.           Medications: I have reviewed the patient's current medications.  Current Outpatient Medications  Medication Sig Dispense Refill  . acetaminophen (TYLENOL) 500 MG tablet Take 500 mg by mouth daily. Pt takes 5 days after chemo    . Calcium Carb-Cholecalciferol  (CALCIUM 1000 + D PO) Take 30 mLs by mouth daily. Calcium 1000 mg / Vit D 1000 iu    . cycloSPORINE (RESTASIS) 0.05 % ophthalmic emulsion Place 1 drop into both eyes 2 (two) times daily.    Marland Kitchen docusate sodium (COLACE) 100 MG capsule Take 100 mg by mouth 2 (two) times daily.    Marland Kitchen enoxaparin (LOVENOX) 80 MG/0.8ML injection Inject 0.8 mLs (80 mg total) into the skin 2 (two) times daily for 30 days. 60 Syringe 1  . ezetimibe (ZETIA) 10 MG tablet Take 10 mg by mouth every morning.     Marland Kitchen ibuprofen (ADVIL,MOTRIN) 200 MG tablet Take 200 mg by mouth every 6 (six) hours as needed for moderate pain.    Marland Kitchen leuprolide (LUPRON) 30 MG injection Inject 45 mg into the muscle every 6 (six) months.    . lidocaine-prilocaine (EMLA) cream Apply 1 application topically as needed. 30 g 0  . Loratadine (CLARITIN) 10 MG CAPS Take 10 mg by mouth daily. Patient takes every day for 5 days after chemo treatment.    . ondansetron (ZOFRAN) 4 MG tablet Take 4 mg by mouth every 8 (eight) hours as needed for nausea or vomiting.    Marland Kitchen oxyCODONE (OXY IR/ROXICODONE) 5 MG immediate release tablet Take 1-2 tablets (5-10 mg total) by mouth every 4 (four) hours as needed for severe pain. 90 tablet 0  . pantoprazole (PROTONIX) 40 MG tablet Take 40 mg by mouth daily after supper.     . polyethylene glycol (MIRALAX / GLYCOLAX) packet Take 17 g by mouth daily as needed for mild constipation. 14 each 0  . predniSONE (DELTASONE) 5 MG tablet TAKE 1 TABLET BY MOUTH 2 TIMES DAILY WITH A MEAL. 60 tablet 2  . prochlorperazine (COMPAZINE) 10 MG tablet Take 1 tablet (10 mg total) by mouth every 6 (six) hours as needed for nausea or vomiting. (Patient taking differently: Take 5 mg by mouth 2 (two) times a day. ) 30 tablet 0  . tamsulosin (FLOMAX) 0.4 MG CAPS capsule Take 0.4 mg by mouth daily.     . traMADol (ULTRAM) 50 MG tablet TAKE 1 TABLET BY MOUTH EVERY 6 HOURS AS NEEDED FOR PAIN 60 tablet 0  . triamcinolone cream (KENALOG) 0.1 % Apply 1 application  topically daily as needed (dry skin in the winter).     Marland Kitchen ZYTIGA 250 MG tablet TAKE 4 TABLETS (1,000 MG TOTAL) BY MOUTH DAILY. TAKE ON AN EMPTY STOMACH 1 HOUR BEFORE OR 2 HOURS AFTER A MEAL (Patient not taking: Reported on 05/18/2019) 120 tablet 0   No current facility-administered medications for this visit.      Allergies:  Allergies  Allergen Reactions  . Statins Other (See Comments)    Muscle pain    Past Medical History, Surgical history, Social history, and Family History were reviewed and updated.    Physical Exam:   Blood pressure 129/72, pulse 81, temperature 98.3 F (36.8 C), temperature source Oral, resp. rate 18, height 5\' 11"  (1.803 m), weight 184 lb 11.2 oz (83.8 kg), SpO2 98 %.     ECOG: 1  General appearance: Alert, awake without any distress. Head: Atraumatic without abnormalities Oropharynx: Without any thrush or ulcers. Eyes: No scleral icterus. Lymph nodes: No lymphadenopathy noted in the cervical, supraclavicular, or axillary nodes Heart:regular rate and rhythm, without any murmurs or gallops.    Right calf swelling noted. Lung: Clear to auscultation without any rhonchi, wheezes or dullness to percussion. Abdomin: Soft, nontender without any shifting dullness or ascites. Musculoskeletal: No clubbing or cyanosis. Neurological: No motor or sensory deficits. Skin: No rashes or lesions.          Lab Results: Lab Results  Component Value Date   WBC 6.5 05/18/2019   HGB 9.6 (L) 05/18/2019   HCT 29.8 (L) 05/18/2019   MCV 92.5 05/18/2019   PLT 218 05/18/2019     Chemistry      Component Value Date/Time   NA 136 05/18/2019 0433   K 4.2 05/18/2019 0433   CL 104 05/18/2019 0433   CO2 23 05/18/2019 0433   BUN 7 (L) 05/18/2019 0433   CREATININE 0.69 05/18/2019 0433   CREATININE 0.82 05/10/2019 0817      Component Value Date/Time   CALCIUM 8.0 (L) 05/18/2019 0433   ALKPHOS 82 05/17/2019 2043   AST 20 05/17/2019 2043   AST 21  05/10/2019 0817   ALT 13 05/17/2019 2043   ALT 9 05/10/2019 0817   BILITOT 0.7 05/17/2019 2043   BILITOT 0.3 05/10/2019 0817     Results for DONALD, MEMOLI (MRN [id number]) as of 05/30/2019 08:13  Ref. Range 04/23/2019 09:36 05/10/2019 08:17  Prostate Specific Ag, Serum Latest Ref Range: 0.0 - 4.0 ng/mL 115.0 (H) 129.0 (H)     IMPRESSION: Small volume pulmonary embolism in right lower lobar and segmental pulmonary arteries.  S7able mild mediastinal and right hilar lymphadenopathy, suspicious for metastatic disease.  Stable left adrenal and right hepatic lobe masses, consistent with metastatic disease.  Critical Value/emergent results were called by telephone at the time of interpretation on 05/17/2019 at 10:38 pm to Dr. Lajean Saver , who verbally acknowledged these results.    78 year old man with the:  1.  Castration-resistant prostate cancer with disease to the bone, lymphadenopathy and hepatic metastasis.  He is currently on salvage chemotherapy utilizing [name] chemotherapy and completed the first cycle without any complications.  Risks and benefits of continuing chemotherapy at this time was reviewed.  Potential complications including nausea, diarrhea and myelosuppression.  I do not think his venous thromboembolism is related to chemotherapy at this time.  He is agreeable to proceed with the second cycle of chemotherapy.   2.  Spinal metastasis: He is status post repeat stereotactic radiosurgery in May 2020.  3.  Bone directed therapy: He is currently on Xgeva which will be repeated in 3 weeks from now.  Potential complications including osteonecrosis of the jaw and hypocalcemia were reiterated.  4.  Androgen deprivation: He continues to receive that under the care of Dr. [name] which I recommended continuing indefinitely.  5.  Pain: No recent exacerbation on oxycodone and Ultram.  6.  Antiemetics: No nausea or vomiting reported currently on Compazine.  7.   IV access: Port-A-Cath inserted and remains in use without any issues.  8.  Growth factor support: He is at risk of developing neutropenic sepsis and will receive Neulasta onpro with each cycle of therapy.   9.  Venous thromboembolism: Likely related to his immobility and malignancy.  He is currently on Lovenox which she will complete 80 mg twice a day for a  month.  After that I will transition him to oral anticoagulation in the form of Xarelto.  He does have lower extremity swelling which I recommended using compression stocking in addition to full dose anticoagulation.  10.  Prognosis and goals of care: Therapy remains palliative although his performance status and activity level remain adequate and aggressive therapy is warranted.   11.  Follow-up: In 3 weeks for the next cycle of chemotherapy.  40  minutes was spent with the patient face-to-face today.  More than 50% of time was spent on reviewing his disease status, reviewing imaging studies, addressing his cancer treatments, complications related to his cancer and therapeutic interventions.    Zola Button, MD 6/11/20208:48 AM

## 2019-05-31 ENCOUNTER — Telehealth: Payer: Self-pay

## 2019-05-31 LAB — PROSTATE-SPECIFIC AG, SERUM (LABCORP): Prostate Specific Ag, Serum: 99.2 ng/mL — ABNORMAL HIGH (ref 0.0–4.0)

## 2019-05-31 NOTE — Telephone Encounter (Signed)
-----   Message from [name], MD sent at 05/31/2019  9:03 AM EDT ----- Please let him know his PSA is down.

## 2019-05-31 NOTE — Telephone Encounter (Signed)
Called patient and let him know that per Dr. Shadad PSA is down. Patient verbalized understanding.  

## 2019-06-01 ENCOUNTER — Ambulatory Visit: Payer: Medicare Other

## 2019-06-03 MED FILL — TAMSULOSIN HCL 0.4 MG CAP: 0.4 | 90 days supply | Qty: 90 | Fill #0

## 2019-06-11 ENCOUNTER — Other Ambulatory Visit: Payer: Self-pay

## 2019-06-11 ENCOUNTER — Telehealth: Payer: Self-pay

## 2019-06-11 MED ORDER — RIVAROXABAN 20 MG PO TABS: 20.0000 mg | ORAL_TABLET | Freq: Every day | ORAL | 3 refills | Status: DC

## 2019-06-11 NOTE — Telephone Encounter (Signed)
Prescription sent to [facility] and patient made aware.

## 2019-06-11 NOTE — Telephone Encounter (Signed)
-----   Message from [name], MD sent at 06/11/2019  9:12 AM EDT ----- Please send an Rx of Xarelto 20 mg daily for 30 days with 3 refills. Thanks.  ----- Message ----- From: Tami Lin, RN Sent: 06/11/2019   9:03 AM EDT To: [name], MD  Mr. Tal said he was instructed to call you when he has 5 days of Lovenox shots remaining so that you can send in a prescription for a PO anticoagulant. He thinks you mentioned Xarelto. [facility].  Lanelle Bal

## 2019-06-13 ENCOUNTER — Ambulatory Visit
Admission: RE | Admit: 2019-06-13 | Discharge: 2019-06-13 | Disposition: A | Discharge status: Home / Self Care | Payer: Medicare Other | Source: Ambulatory Visit | Attending: Urology | Admitting: Urology

## 2019-06-13 DIAGNOSIS — C7949 Secondary malignant neoplasm of other parts of nervous system: Secondary | ICD-10-CM

## 2019-06-13 DIAGNOSIS — C61 Malignant neoplasm of prostate: Secondary | ICD-10-CM

## 2019-06-13 DIAGNOSIS — C7951 Secondary malignant neoplasm of bone: Secondary | ICD-10-CM

## 2019-06-13 NOTE — Progress Notes (Signed)
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  Interval Since Last Radiation:  4 weeks  05/10/19//SRS: The targeted metastasis in the L4 vertebral body was treated to a prescription dose of 18 Gy in 1 fraction.  10/24/18: The targeted metastasis in theL1 and L3vertebral body were eachtreatedto a prescription dose of 18Gy in 1 fraction.   09/04/2018 - 09/17/2018:The lumbar spinetarget at L3-L4was treated to 30 Gy in 10 fractions of 3 Gy.  11/05/2015 - 12/10/2015: 45 Gy in 25 fractions to the pelvis followed by LDR boost, 110 Gy on 12/23/2015 at [facility] c/o Dr. Tobey [name].  Narrative:  I spoke with the patient to conduct his routine scheduled 1 month follow up visit via telephone to spare the patient unnecessary potential exposure in the healthcare setting during the current COVID-19 pandemic.  The patient was notified in advance and gave permission to proceed with this visit format.   He tolerated SRS treatment well and was discharged home In stable condition without any acute ill side effects.                In summary, he was initially diagnosed with Gleason 5+4 adenocarcinoma of the prostate with a PSA of 13.66 in July 2016 under the care of Dr. Lawerance [name]. He elected to proceed with a 5-week course of external beam radiotherapy followed by brachytherapy boost at [facility] with Dr. Tharon [name] between 11/05/15 - 12/2015 in combination with 2 years of androgen deprivation therapy. PSA nadired at 1.0 in 07/2016. 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 Metastatic work-up at that  time showed bone involvement as well as lymphadenopathy. He was treated under the care of Dr. Thera [name] at [facility], initially with enzalutamide, which was started in May 2018 and discontinued in January 2019 when he presented with progression of disease with increased bony metastasis, adrenal nodule and worsening lymphadenopathy. At that time, his treatment was changed to Taxotere chemotherapy with his last treatment in July 2019- discontinued due to disease progression noted on repeat systemic imaging which showed a partial response with slight improvement in the bulky retroperitoneal lymphadenopathy but worsening bony metastasis based on a lumbar spine MRI obtained on June 24, 2018. The MRI showed a complete replacement of L3 vertebral body with tumor in the ventral epidural space behind L3, greater to the left. He elected to transfer his care to Dr. Alen [name] at the [facility] since this is closer to home and has continued on androgen deprivation therapy with Lupron under the care of Dr. Rosana [name] with his last 65-month injection given inAugust2019. He has also been receiving denosumab (Xgeva)every 6 weeks with his next injection scheduled for 10/2018.   Zytigawas added to his treatment regimen on 08/28/2018.         Due to progressive low back pain, he elected to proceed with surgical decompressive laminectomy on the left at L3 with complete removal of the left L3 lamina, microscopic foraminotomies of the left L3 and L4 nerve roots and microscopic removal of epidural tumor on the left under the care of Dr. Saintclair [name] on 08/06/2018.  Final surgical pathology confirmed high-grade metastatic prostate adenocarcinoma. He completed postoperative palliative radiotherapy to the lumbar spine at the levels of L3-L4on 09/17/18.   He did note improvement in his back pain following treatment but continued to struggle with weakness and limitations in mobility. Over time, his pain became progressively severe resulting in  a visit to the emergency department on 10/16/2018 due to intractable pain radiating into the lower extremities with progressive weakness.  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 He was started on IV dexamethasone with significant improvement in his symptoms. He was not felt to be a good candidate for further surgical intervention and therefore elected to proceed with an additional course of palliative SRS to the progressive/new sites of disease in the lumbar spine at L1 and L3 which was completed on 10/24/2018. He tolerated the treatment well and reported significant improvement in his pain following treatment.  His initial post-treatment MRI on 01/25/19 showed an excellent response to treatment with decreased ventral epidural tumor and overall decreased spinal stenosis. There was mild enlargement of the L1 metastasis which was felt most likely treatment related but also noted increased iliac chain lymphadenopathy. He continued on Lupron ADT q 6 months in combination with daily Zytiga. A follow up MRI lumbar spine from 04/26/19 showed stable treated disease at L1 and L3 and a stable benign superior endplate compression fracture at L5 but there was a new metastatic lesion within the anterior L4 vertebral body measuring up to 2.5 cm in diameter and considerable progression of retroperitoneal lymphadenopathy since prior scan in February 2020.   He reported mildly progressive low back pain over the past 1-2 months but denied any new associated neurologic symptoms such as focal weakness in the lower extremities or paraesthesias.    He continued taking Zytiga 1000 mg daily since  September 2019 with Dr. Alen [name] as well as Lupron every 6 months for ADT under the care and direction of Dr. Rosana [name] (last injection 01/2019), his urologist.  Unfortunately, his PSA continued to increase despite treatment with a PSA of 27.6 on 12/25/2018, 51 in 02/2019, and 115 on 04/23/19.  He had a follow-up visit with his medical oncologist, Dr. Alen [name] on 04/25/2019 and the recommendation was to discontinue his Zytiga and switch to systemic chemotherapy with [name] in addition to his Lupron ADT.  He had a port-a-cath placed and started his systemic chemotherapy with [name] on Friday 05/10/19.  Overall, he continues to feel well and is currently without complaints aside from low back pain when he stands for any period of time.  His most recent PSA was 99.2 on 05/30/19, decreased from 129 on 05/10/19 which is encouraging.  Unfortunately, since we saw him last, he requeired hospital admission on 05/17/19 for left sided chest/back pain and was found to have a small volume PE.  He will transition from Lovenox injections to oral Xarelto next week.            On review of systems, the patient states that he is doing very well overall.  His back pain has significantly improved.  He developed some lower extremity swelling associated with his recent DVT and this has significantly improved since  starting blood thinners.  He denies any new paresthesias or focal weakness in the upper or lower extremities.  He does continue with generalized weakness, particularly notable approximately 3 days after his systemic chemo treatments.  Otherwise, he feels well in general and feels that he is tolerating the chemo treatments fairly well.  He reports a healthy appetite and is maintaining his weight.  He denies abdominal pain, nausea, vomiting, diarrhea or constipation.  Overall, he is pleased with his progress to date.  ALLERGIES:  is allergic to statins.  Meds: Current Outpatient Medications  Medication Sig Dispense Refill   acetaminophen  (TYLENOL) 500 MG tablet Take 500 mg by mouth daily. Pt takes 5 days after chemo     Calcium Carb-Cholecalciferol (CALCIUM 1000 + D PO) Take 30 mLs by mouth daily. Calcium 1000 mg / Vit D 1000 iu     cycloSPORINE (RESTASIS) 0.05 % ophthalmic emulsion Place 1 drop into both eyes 2 (two) times daily.     docusate sodium (COLACE) 100 MG capsule Take 100 mg by mouth 2 (two) times daily.     enoxaparin (LOVENOX) 80 MG/0.8ML injection Inject 0.8 mLs (80 mg total) into the skin 2 (two) times daily for 30 days. 60 Syringe 1   ezetimibe (ZETIA) 10 MG tablet Take 10 mg by mouth every morning.      ibuprofen (ADVIL,MOTRIN) 200 MG tablet Take 200 mg by mouth every 6 (six) hours as needed for moderate pain.     leuprolide (LUPRON) 30 MG injection Inject 45 mg into the muscle every 6 (six) months.     lidocaine-prilocaine (EMLA) cream Apply 1 application topically as needed. 30 g 0   Loratadine (CLARITIN) 10 MG CAPS Take 10 mg by mouth daily. Patient takes every day for 5 days after chemo treatment.     ondansetron (ZOFRAN) 4 MG tablet Take 4 mg by mouth every 8 (eight) hours as needed for nausea or vomiting.     oxyCODONE (OXY IR/ROXICODONE) 5 MG immediate release tablet Take 1-2 tablets (5-10 mg total) by mouth every 4 (four) hours as needed for severe pain. 90 tablet 0   pantoprazole (PROTONIX) 40 MG tablet Take 40 mg by mouth daily after supper.      polyethylene glycol (MIRALAX / GLYCOLAX) packet Take 17 g by mouth daily as needed for mild constipation. 14 each 0   predniSONE (DELTASONE) 5 MG tablet TAKE 1 TABLET BY MOUTH 2 TIMES DAILY WITH A MEAL. 60 tablet 2   prochlorperazine (COMPAZINE) 10 MG tablet Take 1 tablet (10 mg total) by mouth every 6 (six) hours as needed for nausea or vomiting. (Patient taking differently: Take 5 mg by mouth 2 (two) times a day. ) 30 tablet 0   rivaroxaban (XARELTO) 20 MG TABS tablet Take 1 tablet (20 mg total) by mouth daily with supper for 30 days. 30 tablet 3     tamsulosin (FLOMAX) 0.4 MG CAPS capsule Take 0.4 mg by mouth daily.      traMADol (ULTRAM) 50 MG tablet TAKE 1 TABLET BY MOUTH EVERY 6 HOURS AS NEEDED FOR PAIN 60 tablet 0   triamcinolone cream (KENALOG) 0.1 % Apply 1 application topically daily as needed (dry skin in the winter).      ZYTIGA 250 MG tablet TAKE 4 TABLETS (1,000 MG TOTAL) BY MOUTH DAILY. TAKE ON AN EMPTY STOMACH 1 HOUR BEFORE OR 2 HOURS AFTER A MEAL (Patient not taking: Reported on 05/18/2019) 120 tablet 0   No current facility-administered medications  for this visit.     Physical Findings:  vitals were not taken for this visit.   /Unable to assess due to telephone follow-up visit format.  Lab Findings: Lab Results  Component Value Date   WBC 8.0 05/30/2019   HGB 10.4 (L) 05/30/2019   HCT 32.5 (L) 05/30/2019   MCV 94.2 05/30/2019   PLT 390 05/30/2019     Radiographic Findings: Ct Angio Chest Pe W/cm &/or Wo Cm  Result Date: 05/17/2019 CLINICAL DATA:  Pleuritic chest pain beginning today. Currently undergoing treatment for prostate carcinoma. High pretest probability for pulmonary embolism. EXAM: CT ANGIOGRAPHY CHEST WITH CONTRAST TECHNIQUE: Multidetector CT imaging of the chest was performed using the standard protocol during bolus administration of intravenous contrast. Multiplanar CT image reconstructions and MIPs were obtained to evaluate the vascular anatomy. CONTRAST:  48mL OMNIPAQUE IOHEXOL 350 MG/ML SOLN COMPARISON:  04/23/2019 FINDINGS: Cardiovascular: Small volume pulmonary embolism is seen in the right lower lobar and segmental pulmonary arteries. No evidence of thoracic aortic dissection or aneurysm. Mediastinum/Nodes: Mild mediastinal and right hilar lymphadenopathy remains stable. Lungs/Pleura: No evidence of pulmonary infiltrate or mass. There has been near complete resolution of ground-glass opacities in the right upper lobe since prior study. Tiny right pleural effusion is noted. Upper abdomen: Large  mass measuring approximately 6 cm is again seen in the posterior right hepatic lobe. Large left adrenal mass measuring approximately 5 mm is again demonstrated. These findings are consistent with metastatic disease. Musculoskeletal: No suspicious bone lesions identified. Review of the MIP images confirms the above findings. IMPRESSION: Small volume pulmonary embolism in right lower lobar and segmental pulmonary arteries. Stable mild mediastinal and right hilar lymphadenopathy, suspicious for metastatic disease. Stable left adrenal and right hepatic lobe masses, consistent with metastatic disease. Critical Value/emergent results were called by telephone at the time of interpretation on 05/17/2019 at 10:38 pm to Dr. Lajean Saver , who verbally acknowledged these results. Electronically Signed   By: Earle Gell M.D.   On: 05/17/2019 22:41   Dg Chest Port 1 View  Result Date: 05/17/2019 CLINICAL DATA:  Fever EXAM: PORTABLE CHEST 1 VIEW COMPARISON:  None. FINDINGS: The heart size and mediastinal contours are within normal limits. Both lungs are clear. The visualized skeletal structures are unremarkable. There is a right chest wall power-injectable Port-A-Cath with tip in the proximal right atrium via a right internal jugular vein approach. IMPRESSION: No active disease. Electronically Signed   By: Ulyses Jarred M.D.   On: 05/17/2019 21:24   Vas [place] Lower Extremity Venous (dvt)  Result Date: 05/18/2019  Lower Venous Study Indications: Pulmonary embolism.  Performing Technologist: Abram Sander RVS  Examination Guidelines: A complete evaluation includes B-mode imaging, spectral Doppler, color Doppler, and power Doppler as needed of all accessible portions of each vessel. Bilateral testing is considered an integral part of a complete examination. Limited examinations for reoccurring indications may be performed as noted.  +---------+---------------+---------+-----------+---------------+--------------+  RIGHT      Compressibility Phasicity Spontaneity Properties      Summary         +---------+---------------+---------+-----------+---------------+--------------+  CFV       Partial         Yes       Yes         partially       Acute  re-cannalized                   +---------+---------------+---------+-----------+---------------+--------------+  SFJ       Full                                                                  +---------+---------------+---------+-----------+---------------+--------------+  FV Prox   None                                                                  +---------+---------------+---------+-----------+---------------+--------------+  FV Mid    Full                                                                  +---------+---------------+---------+-----------+---------------+--------------+  FV Distal Full                                                                  +---------+---------------+---------+-----------+---------------+--------------+  PFV       Full                                                                  +---------+---------------+---------+-----------+---------------+--------------+  POP       Full            Yes       Yes                                         +---------+---------------+---------+-----------+---------------+--------------+  PTV       Full                                                                  +---------+---------------+---------+-----------+---------------+--------------+  PERO                                                            Not visualized  +---------+---------------+---------+-----------+---------------+--------------+   +---------+---------------+---------+-----------+----------+--------------+  LEFT  Compressibility Phasicity Spontaneity Properties Summary         +---------+---------------+---------+-----------+----------+--------------+  CFV       Full             Yes       Yes                                    +---------+---------------+---------+-----------+----------+--------------+  SFJ       Full                                                             +---------+---------------+---------+-----------+----------+--------------+  FV Prox   Full                                                             +---------+---------------+---------+-----------+----------+--------------+  FV Mid    Full                                                             +---------+---------------+---------+-----------+----------+--------------+  FV Distal Full                                                             +---------+---------------+---------+-----------+----------+--------------+  PFV       Full                                                             +---------+---------------+---------+-----------+----------+--------------+  POP       Full            Yes       Yes                                    +---------+---------------+---------+-----------+----------+--------------+  PTV       Full                                                             +---------+---------------+---------+-----------+----------+--------------+  PERO  Not visualized  +---------+---------------+---------+-----------+----------+--------------+     Summary: Right: Findings consistent with acute deep vein thrombosis involving the right common femoral vein, and right femoral vein. No cystic structure found in the popliteal fossa. Left: There is no evidence of deep vein thrombosis in the lower extremity. No cystic structure found in the popliteal fossa.  *See table(s) above for measurements and observations. Electronically signed by Monica Martinez MD on 05/18/2019 at 4:34:11 PM.    Final     Impression/Plan: 15. 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 He has recovered  well from his recent SRS spine treatment and is currently without complaints.  We will plan to follow-up with an MRI of the lumbar spine in approximately 2 months to assess his response to treatment and continue to monitor for any evidence of disease progression or recurrence.  We will see him back thereafter to discuss results and recommendations from multidisciplinary spine conference.  He will also continue in routine follow-up under the care and direction of Dr. Alen [name] and Dr. Rosana [name] for continued management of his systemic disease.  He thinks that he is due for his next Lupron injection in August 2020 with Dr. Rosana [name] and is scheduled for his third cycle of [name] systemic therapy next week.  He appears to have a good understanding of these recommendations and is comfortable and in agreement with the stated plan.  He knows to call at anytime with any questions or concerns in the interim.     Nicholos Johns, PA-C

## 2019-06-20 ENCOUNTER — Inpatient Hospital Stay: Payer: Medicare Other | Attending: Oncology

## 2019-06-20 ENCOUNTER — Inpatient Hospital Stay (HOSPITAL_BASED_OUTPATIENT_CLINIC_OR_DEPARTMENT_OTHER): Payer: Medicare Other | Admitting: Oncology

## 2019-06-20 ENCOUNTER — Other Ambulatory Visit: Payer: Self-pay

## 2019-06-20 ENCOUNTER — Inpatient Hospital Stay: Payer: Medicare Other

## 2019-06-20 ENCOUNTER — Telehealth: Payer: Self-pay | Admitting: Oncology

## 2019-06-20 VITALS — BP 140/70 | HR 100 | Temp 98.7°F | Resp 18 | Ht 71.0 in | Wt 181.8 lb

## 2019-06-20 DIAGNOSIS — Z5189 Encounter for other specified aftercare: Secondary | ICD-10-CM

## 2019-06-20 DIAGNOSIS — C61 Malignant neoplasm of prostate: Secondary | ICD-10-CM | POA: Diagnosis not present

## 2019-06-20 DIAGNOSIS — Z5111 Encounter for antineoplastic chemotherapy: Secondary | ICD-10-CM | POA: Diagnosis not present

## 2019-06-20 DIAGNOSIS — Z79899 Other long term (current) drug therapy: Secondary | ICD-10-CM

## 2019-06-20 DIAGNOSIS — C7951 Secondary malignant neoplasm of bone: Secondary | ICD-10-CM | POA: Diagnosis not present

## 2019-06-20 DIAGNOSIS — Z923 Personal history of irradiation: Secondary | ICD-10-CM

## 2019-06-20 DIAGNOSIS — Z95828 Presence of other vascular implants and grafts: Secondary | ICD-10-CM

## 2019-06-20 DIAGNOSIS — Z7901 Long term (current) use of anticoagulants: Secondary | ICD-10-CM | POA: Insufficient documentation

## 2019-06-20 DIAGNOSIS — C787 Secondary malignant neoplasm of liver and intrahepatic bile duct: Secondary | ICD-10-CM | POA: Diagnosis not present

## 2019-06-20 DIAGNOSIS — I749 Embolism and thrombosis of unspecified artery: Secondary | ICD-10-CM | POA: Insufficient documentation

## 2019-06-20 LAB — CBC WITH DIFFERENTIAL (CANCER CENTER ONLY)
Abs Immature Granulocytes: 0.05 10*3/uL (ref 0.00–0.07)
Basophils Absolute: 0.1 10*3/uL (ref 0.0–0.1)
Basophils Relative: 1 %
Eosinophils Absolute: 0 10*3/uL (ref 0.0–0.5)
Eosinophils Relative: 0 %
HCT: 31.9 % — ABNORMAL LOW (ref 39.0–52.0)
Hemoglobin: 10 g/dL — ABNORMAL LOW (ref 13.0–17.0)
Immature Granulocytes: 1 %
Lymphocytes Relative: 5 %
Lymphs Abs: 0.4 10*3/uL — ABNORMAL LOW (ref 0.7–4.0)
MCH: 30.2 pg (ref 26.0–34.0)
MCHC: 31.3 g/dL (ref 30.0–36.0)
MCV: 96.4 fL (ref 80.0–100.0)
Monocytes Absolute: 1.2 10*3/uL — ABNORMAL HIGH (ref 0.1–1.0)
Monocytes Relative: 13 %
Neutro Abs: 7.2 10*3/uL (ref 1.7–7.7)
Neutrophils Relative %: 80 %
Platelet Count: 362 10*3/uL (ref 150–400)
RBC: 3.31 MIL/uL — ABNORMAL LOW (ref 4.22–5.81)
RDW: 18.2 % — ABNORMAL HIGH (ref 11.5–15.5)
WBC Count: 9 10*3/uL (ref 4.0–10.5)
nRBC: 0 % (ref 0.0–0.2)

## 2019-06-20 LAB — CMP (CANCER CENTER ONLY)
ALT: 9 U/L (ref 0–44)
AST: 14 U/L — ABNORMAL LOW (ref 15–41)
Albumin: 2.9 g/dL — ABNORMAL LOW (ref 3.5–5.0)
Alkaline Phosphatase: 70 U/L (ref 38–126)
Anion gap: 9 (ref 5–15)
BUN: 15 mg/dL (ref 8–23)
CO2: 22 mmol/L (ref 22–32)
Calcium: 8.4 mg/dL — ABNORMAL LOW (ref 8.9–10.3)
Chloride: 107 mmol/L (ref 98–111)
Creatinine: 0.97 mg/dL (ref 0.61–1.24)
GFR, Est AFR Am: >60 mL/min (ref >60)
GFR, Estimated: >60 mL/min (ref >60)
Glucose, Bld: 94 mg/dL (ref 70–99)
Potassium: 3.6 mmol/L (ref 3.5–5.1)
Sodium: 138 mmol/L (ref 135–145)
Total Bilirubin: 0.3 mg/dL (ref 0.3–1.2)
Total Protein: 5.7 g/dL — ABNORMAL LOW (ref 6.5–8.1)

## 2019-06-20 MED ORDER — DEXAMETHASONE SODIUM PHOSPHATE 10 MG/ML IJ SOLN
10.0000 mg | Freq: Once | INTRAMUSCULAR | Status: AC
  Administered 2019-06-20: 10 mg via INTRAVENOUS

## 2019-06-20 MED ORDER — SODIUM CHLORIDE 0.9% FLUSH
10.0000 mL | Freq: Once | INTRAVENOUS | Status: AC
  Administered 2019-06-20: 10 mL
  Filled 2019-06-20: qty 10

## 2019-06-20 MED ORDER — PEGFILGRASTIM 6 MG/0.6ML ~~LOC~~ PSKT
6.0000 mg | PREFILLED_SYRINGE | Freq: Once | SUBCUTANEOUS | Status: AC
  Administered 2019-06-20: 6 mg via SUBCUTANEOUS

## 2019-06-20 MED ORDER — DENOSUMAB 120 MG/1.7ML ~~LOC~~ SOLN
SUBCUTANEOUS | Status: AC
  Filled 2019-06-20: qty 1.7

## 2019-06-20 MED ORDER — SODIUM CHLORIDE 0.9% FLUSH
10.0000 mL | INTRAVENOUS | Status: DC | PRN
  Administered 2019-06-20: 10 mL
  Filled 2019-06-20: qty 10

## 2019-06-20 MED ORDER — SODIUM CHLORIDE 0.9 % IV SOLN
20.0000 mg/m2 | Freq: Once | INTRAVENOUS | Status: AC
  Administered 2019-06-20: 42 mg via INTRAVENOUS
  Filled 2019-06-20: qty 4.2

## 2019-06-20 MED ORDER — DENOSUMAB 120 MG/1.7ML ~~LOC~~ SOLN
120.0000 mg | Freq: Once | SUBCUTANEOUS | Status: AC
  Administered 2019-06-20: 120 mg via SUBCUTANEOUS

## 2019-06-20 MED ORDER — FAMOTIDINE IN NACL 20-0.9 MG/50ML-% IV SOLN
INTRAVENOUS | Status: AC
  Filled 2019-06-20: qty 50

## 2019-06-20 MED ORDER — PEGFILGRASTIM 6 MG/0.6ML ~~LOC~~ PSKT
PREFILLED_SYRINGE | SUBCUTANEOUS | Status: AC
  Filled 2019-06-20: qty 0.6

## 2019-06-20 MED ORDER — DIPHENHYDRAMINE HCL 50 MG/ML IJ SOLN
25.0000 mg | Freq: Once | INTRAMUSCULAR | Status: AC
  Administered 2019-06-20: 25 mg via INTRAVENOUS

## 2019-06-20 MED ORDER — DEXAMETHASONE SODIUM PHOSPHATE 10 MG/ML IJ SOLN
INTRAMUSCULAR | Status: AC
  Filled 2019-06-20: qty 1

## 2019-06-20 MED ORDER — FAMOTIDINE IN NACL 20-0.9 MG/50ML-% IV SOLN
20.0000 mg | Freq: Once | INTRAVENOUS | Status: AC
  Administered 2019-06-20: 20 mg via INTRAVENOUS

## 2019-06-20 MED ORDER — HEPARIN SOD (PORK) LOCK FLUSH 100 UNIT/ML IV SOLN
500.0000 [IU] | Freq: Once | INTRAVENOUS | Status: AC | PRN
  Administered 2019-06-20: 500 [IU]
  Filled 2019-06-20: qty 5

## 2019-06-20 MED ORDER — DIPHENHYDRAMINE HCL 50 MG/ML IJ SOLN
INTRAMUSCULAR | Status: AC
  Filled 2019-06-20: qty 1

## 2019-06-20 MED ORDER — SODIUM CHLORIDE 0.9 % IV SOLN
Freq: Once | INTRAVENOUS | Status: AC
  Administered 2019-06-20: 10:00:00 via INTRAVENOUS
  Filled 2019-06-20: qty 250

## 2019-06-20 NOTE — Telephone Encounter (Signed)
Scheduled appt per 7/2 los - pt to get an updated schedule next visit.  

## 2019-06-20 NOTE — Patient Instructions (Signed)
Cancer Center Discharge Instructions for Patients Receiving Chemotherapy  Today you received the following chemotherapy agents: [name]  To help prevent nausea and vomiting after your treatment, we encourage you to take your nausea medication as directed.   If you develop nausea and vomiting that is not controlled by your nausea medication, call the clinic.   BELOW ARE SYMPTOMS THAT SHOULD BE REPORTED IMMEDIATELY:  *FEVER GREATER THAN 100.5 F  *CHILLS WITH OR WITHOUT FEVER  NAUSEA AND VOMITING THAT IS NOT CONTROLLED WITH YOUR NAUSEA MEDICATION  *UNUSUAL SHORTNESS OF BREATH  *UNUSUAL BRUISING OR BLEEDING  TENDERNESS IN MOUTH AND THROAT WITH OR WITHOUT PRESENCE OF ULCERS  *URINARY PROBLEMS  *BOWEL PROBLEMS  UNUSUAL RASH Items with * indicate a potential emergency and should be followed up as soon as possible.  Feel free to call the clinic should you have any questions or concerns. The clinic phone number is [phone].  Please show the CHEMO ALERT CARD at check-in to the Emergency Department and triage nurse.   

## 2019-06-20 NOTE — Progress Notes (Signed)
Hematology and Oncology Follow Up Visit  [name] [id number] 10-01-1941 78 y.o. 06/20/2019 9:31 AM [name], MDPharr, Thayer Jew, MD   Principle Diagnosis: 78 year old man with advanced prostate cancer with disease to the bone diagnosed in 2018.  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   Prior Therapy: He was treated there with external beam radiation and brachii therapy in January 2017 and completed 2 years of androgen deprivation.  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  Metastatic work-up at that time showed bone involvement as well as lymphadenopathy.    He was treated under the care of Dr. [name] at [facility] initially with enzalutamide started in May 2018 until January 2019 where he presented with progression of disease with bony metastasis, adrenal nodule as well as worsening adenopathy.    Taxotere chemotherapy initially at 60 mg/m and that was reduced to 45 mg/m after cycle 6.  His last treatment was in July 2019 given at [facility], [place].  Repeat imaging studies showed his lymphadenopathy has improved but he did develop worsening bony metastasis based on a lumbar spine obtained on June 24, 2018.  The MRI showed a complete replacement of L3 vertebral body with tumor the tumor in the ventral epidural space behind L3 greater to the left.  Bulky retroperitoneal adenopathy was slightly improved as mentioned.    He is status post decompressive laminectomy and removal of L3 tumor completed on 08/06/2018.  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   Zytiga 1000 mg daily started in September 2019.  Therapy discontinued in May 2020 for progression of disease.  He is status post stereotactic radiosurgery to the L4 vertebral body completed on May 10, 2019.  Current therapy:  [name] chemotherapy 20 mg per metered square started on May 10, 2019.  He is here for cycle 3 of therapy.  Androgen deprivation under the care of Dr. [name].   Xgeva 120 mg every 6 weeks.  Last injection given on May 10, 2019.  Interim History: [name] returns today for a follow-up.  Since the last visit, he tolerated the last cycle of chemotherapy without any major complaints.  He is reporting no nausea, vomiting or peripheral neuropathy.  He does report some mild fatigue but has been manageable at this time.  Denies any recent falls or syncope.  He denies any bleeding complications with Xarelto.  Patient denied headaches, blurry vision, syncope or seizures.  Denies any fevers, chills or sweats.  Denied chest pain, palpitation, orthopnea or leg edema.  Denied cough, wheezing or hemoptysis.  Denied nausea, vomiting or abdominal pain.  Denies any constipation or diarrhea.  Denies any frequency urgency or hesitancy.  Denies any arthralgias or myalgias.  Denies any skin rashes or lesions.  Denies any bleeding or clotting tendency.  Denies any easy bruising.  Denies any hair or nail changes.  Denies any anxiety or depression.  Remaining review of system is negative.           Medications: I have reviewed the patient's current medications.  Unchanged by my review. Current Outpatient Medications  Medication Sig Dispense Refill  . acetaminophen (TYLENOL) 500 MG tablet Take 500 mg by mouth daily. Pt takes 5 days after chemo    . Calcium Carb-Cholecalciferol (CALCIUM 1000 + D PO)  Take 30 mLs by mouth daily. Calcium 1000 mg / Vit D 1000 iu    . cycloSPORINE (RESTASIS) 0.05 % ophthalmic emulsion Place 1 drop into both eyes 2 (two) times daily.    Marland Kitchen docusate sodium (COLACE) 100 MG capsule Take 100 mg by mouth 2 (two) times daily.    Marland Kitchen enoxaparin (LOVENOX) 80 MG/0.8ML injection Inject 0.8 mLs (80 mg total) into the skin 2 (two) times daily for 30 days. 60 Syringe 1  .  ezetimibe (ZETIA) 10 MG tablet Take 10 mg by mouth every morning.     Marland Kitchen ibuprofen (ADVIL,MOTRIN) 200 MG tablet Take 200 mg by mouth every 6 (six) hours as needed for moderate pain.    Marland Kitchen leuprolide (LUPRON) 30 MG injection Inject 45 mg into the muscle every 6 (six) months.    . lidocaine-prilocaine (EMLA) cream Apply 1 application topically as needed. 30 g 0  . Loratadine (CLARITIN) 10 MG CAPS Take 10 mg by mouth daily. Patient takes every day for 5 days after chemo treatment.    . ondansetron (ZOFRAN) 4 MG tablet Take 4 mg by mouth every 8 (eight) hours as needed for nausea or vomiting.    Marland Kitchen oxyCODONE (OXY IR/ROXICODONE) 5 MG immediate release tablet Take 1-2 tablets (5-10 mg total) by mouth every 4 (four) hours as needed for severe pain. 90 tablet 0  . pantoprazole (PROTONIX) 40 MG tablet Take 40 mg by mouth daily after supper.     . polyethylene glycol (MIRALAX / GLYCOLAX) packet Take 17 g by mouth daily as needed for mild constipation. 14 each 0  . predniSONE (DELTASONE) 5 MG tablet TAKE 1 TABLET BY MOUTH 2 TIMES DAILY WITH A MEAL. 60 tablet 2  . prochlorperazine (COMPAZINE) 10 MG tablet Take 1 tablet (10 mg total) by mouth every 6 (six) hours as needed for nausea or vomiting. (Patient taking differently: Take 5 mg by mouth 2 (two) times a day. ) 30 tablet 0  . rivaroxaban (XARELTO) 20 MG TABS tablet Take 1 tablet (20 mg total) by mouth daily with supper for 30 days. 30 tablet 3  . tamsulosin (FLOMAX) 0.4 MG CAPS capsule Take 0.4 mg by mouth daily.     . traMADol (ULTRAM) 50 MG tablet TAKE 1 TABLET BY MOUTH EVERY 6 HOURS AS NEEDED FOR PAIN 60 tablet 0  . triamcinolone cream (KENALOG) 0.1 % Apply 1 application topically daily as needed (dry skin in the winter).     Marland Kitchen ZYTIGA 250 MG tablet TAKE 4 TABLETS (1,000 MG TOTAL) BY MOUTH DAILY. TAKE ON AN EMPTY STOMACH 1 HOUR BEFORE OR 2 HOURS AFTER A MEAL (Patient not taking: Reported on 05/18/2019) 120 tablet 0   No current facility-administered  medications for this visit.      Allergies:  Allergies  Allergen Reactions  . Statins Other (See Comments)    Muscle pain    Past Medical History, Surgical history, Social history, and Family History were reviewed and updated.    Physical Exam:   Blood pressure 140/70, pulse 100, temperature 98.7 F (37.1 C), temperature source Oral, resp. rate 18, height 5\' 11"  (1.803 m), weight 181 lb 12.8 oz (82.5 kg), SpO2 100 %.     ECOG: 1   General appearance: Comfortable appearing without any discomfort Head: Normocephalic without any trauma Oropharynx: Mucous membranes are moist and pink without any thrush or ulcers. Eyes: Pupils are equal and round reactive to light. Lymph nodes: No cervical, supraclavicular, inguinal or axillary lymphadenopathy.  Heart:regular rate and rhythm.  S1 and S2 without leg edema. Lung: Clear without any rhonchi or wheezes.  No dullness to percussion. Abdomin: Soft, nontender, nondistended with good bowel sounds.  No hepatosplenomegaly. Musculoskeletal: No joint deformity or effusion.  Full range of motion noted. Neurological: No deficits noted on motor, sensory and deep tendon reflex exam. Skin: No petechial rash or dryness.  Appeared moist.            Lab Results: Lab Results  Component Value Date   WBC 9.0 06/20/2019   HGB 10.0 (L) 06/20/2019   HCT 31.9 (L) 06/20/2019   MCV 96.4 06/20/2019   PLT 362 06/20/2019     Chemistry      Component Value Date/Time   NA 137 05/30/2019 0823   K 3.7 05/30/2019 0823   CL 107 05/30/2019 0823   CO2 22 05/30/2019 0823   BUN 13 05/30/2019 0823   CREATININE 0.69 05/30/2019 0823      Component Value Date/Time   CALCIUM 8.6 (L) 05/30/2019 0823   ALKPHOS 88 05/30/2019 0823   AST 14 (L) 05/30/2019 0823   ALT 9 05/30/2019 0823   BILITOT 0.2 (L) 05/30/2019 0823     Results for LEVIS, NAZIR (MRN [id number]) as of 06/20/2019 09:17  Ref. Range 05/10/2019 08:17 05/30/2019 08:23  Prostate  Specific Ag, Serum Latest Ref Range: 0.0 - 4.0 ng/mL 129.0 (H) 99.59 (H)     78 year old man with the:  1.  Advanced prostate cancer that is currently castration-resistant with noted hepatic metastasis.  He remains on [name] chemotherapy without any major complaints.  Risks and benefits of continuing this therapy was discussed.  Complication associated with this treatment include nausea, vomiting, myelosuppression, diarrhea and worsening neuropathy.  His PSA continues to show reasonable response and is agreeable to continue.   2.  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  He is status post radiation in May 2020.  3.  Bone directed therapy: Risks and benefits of continuing Delton See was reviewed today.  Potential complications including osteonecrosis of the jaw and hypocalcemia.  He is agreeable to proceed and will have injection today and repeated in 6 weeks.   4.  Androgen deprivation: He continues to receive that under the care of Dr. [name].  I recommended continuing that indefinitely.  5.  Pain: Very little pain noted since last visit.  Oxycodone and Ultram used intermittently.  6.  Antiemetics: Compazine is available to him at this time.  No recent nausea or vomiting.  7.  IV access: Port-A-Cath remains in use and accessed without any issues.  8.  Growth factor support: He will continue to receive growth factor support after each cycle of therapy.  He is high risk of developing neutropenic sepsis.  9.  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  10.  Prognosis and goals of care: His disease is incurable but aggressive therapy is warranted given his reasonable performance status.  This was reviewed today again with him.   11.  Follow-up: We will be in 3 weeks for repeat evaluation.  25  minutes was spent with the patient face-to-face today.  More than 50% of time was dedicated to  discussing his laboratory data, disease status update, complications related therapy answering questions regarding future plan of care.    Zola Button, MD 7/2/20209:31 AM

## 2019-06-21 LAB — PROSTATE-SPECIFIC AG, SERUM (LABCORP): Prostate Specific Ag, Serum: 90.8 ng/mL — ABNORMAL HIGH (ref 0.0–4.0)

## 2019-06-22 ENCOUNTER — Ambulatory Visit: Payer: Medicare Other

## 2019-06-28 ENCOUNTER — Telehealth: Payer: Self-pay

## 2019-06-28 NOTE — Telephone Encounter (Signed)
Contacted patient and made aware of PSA results. 

## 2019-06-28 NOTE — Telephone Encounter (Signed)
-----   Message from [name], MD sent at 06/28/2019 10:55 AM EDT ----- Please let him know his PSA is declining.

## 2019-07-04 ENCOUNTER — Other Ambulatory Visit: Payer: Self-pay | Admitting: Oncology

## 2019-07-04 MED ORDER — OXYCODONE HCL 5 MG PO TABS: 5.0000 mg | ORAL_TABLET | ORAL | 0 refills | Status: DC | PRN

## 2019-07-10 ENCOUNTER — Encounter: Payer: Self-pay | Admitting: Radiation Oncology

## 2019-07-10 NOTE — Progress Notes (Signed)
  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     Beams/energy:  3 Rapid Arc VMAT Beams with 6 MV X-rays in the flattening filter free mode  Narrative: The patient tolerated treatment without significant acute effects, and was discharged to home in stable condition.    Plan: The patient has completed radiation treatment. The patient will return to radiation oncology clinic for routine followup in one month. I advised him to call or return sooner if he has any questions or concerns related to his recovery or treatment. ________________________________  Sheral Apley. Tammi Klippel, M.D.   This document serves as a record of services personally performed by Tyler Pita, MD. It was created on his behalf by Wilburn Mylar, a trained medical scribe. The creation of this record is based on the scribe's personal observations and the provider's statements to them. This document has been checked and approved by the attending provider.

## 2019-07-11 ENCOUNTER — Telehealth: Payer: Self-pay | Admitting: Oncology

## 2019-07-11 ENCOUNTER — Inpatient Hospital Stay (HOSPITAL_BASED_OUTPATIENT_CLINIC_OR_DEPARTMENT_OTHER): Payer: Medicare Other | Admitting: Oncology

## 2019-07-11 ENCOUNTER — Inpatient Hospital Stay: Payer: Medicare Other

## 2019-07-11 ENCOUNTER — Encounter: Payer: Self-pay | Admitting: Oncology

## 2019-07-11 ENCOUNTER — Other Ambulatory Visit: Payer: Self-pay

## 2019-07-11 VITALS — BP 129/67 | HR 95 | Temp 98.0°F | Resp 18 | Ht 71.0 in | Wt 186.0 lb

## 2019-07-11 DIAGNOSIS — C61 Malignant neoplasm of prostate: Secondary | ICD-10-CM

## 2019-07-11 DIAGNOSIS — Z923 Personal history of irradiation: Secondary | ICD-10-CM

## 2019-07-11 DIAGNOSIS — I749 Embolism and thrombosis of unspecified artery: Secondary | ICD-10-CM

## 2019-07-11 DIAGNOSIS — Z95828 Presence of other vascular implants and grafts: Secondary | ICD-10-CM

## 2019-07-11 DIAGNOSIS — Z79899 Other long term (current) drug therapy: Secondary | ICD-10-CM

## 2019-07-11 DIAGNOSIS — Z5111 Encounter for antineoplastic chemotherapy: Secondary | ICD-10-CM | POA: Diagnosis not present

## 2019-07-11 DIAGNOSIS — Z7901 Long term (current) use of anticoagulants: Secondary | ICD-10-CM

## 2019-07-11 DIAGNOSIS — C787 Secondary malignant neoplasm of liver and intrahepatic bile duct: Secondary | ICD-10-CM | POA: Diagnosis not present

## 2019-07-11 DIAGNOSIS — C7951 Secondary malignant neoplasm of bone: Secondary | ICD-10-CM | POA: Diagnosis not present

## 2019-07-11 DIAGNOSIS — Z5189 Encounter for other specified aftercare: Secondary | ICD-10-CM | POA: Diagnosis not present

## 2019-07-11 LAB — CBC WITH DIFFERENTIAL (CANCER CENTER ONLY)
Abs Immature Granulocytes: 0.04 10*3/uL (ref 0.00–0.07)
Basophils Absolute: 0.1 10*3/uL (ref 0.0–0.1)
Basophils Relative: 1 %
Eosinophils Absolute: 0 10*3/uL (ref 0.0–0.5)
Eosinophils Relative: 0 %
HCT: 30.9 % — ABNORMAL LOW (ref 39.0–52.0)
Hemoglobin: 9.7 g/dL — ABNORMAL LOW (ref 13.0–17.0)
Immature Granulocytes: 1 %
Lymphocytes Relative: 6 %
Lymphs Abs: 0.4 10*3/uL — ABNORMAL LOW (ref 0.7–4.0)
MCH: 30.6 pg (ref 26.0–34.0)
MCHC: 31.4 g/dL (ref 30.0–36.0)
MCV: 97.5 fL (ref 80.0–100.0)
Monocytes Absolute: 0.9 10*3/uL (ref 0.1–1.0)
Monocytes Relative: 12 %
Neutro Abs: 5.8 10*3/uL (ref 1.7–7.7)
Neutrophils Relative %: 80 %
Platelet Count: 311 10*3/uL (ref 150–400)
RBC: 3.17 MIL/uL — ABNORMAL LOW (ref 4.22–5.81)
RDW: 18.2 % — ABNORMAL HIGH (ref 11.5–15.5)
WBC Count: 7.2 10*3/uL (ref 4.0–10.5)
nRBC: 0 % (ref 0.0–0.2)

## 2019-07-11 LAB — CMP (CANCER CENTER ONLY)
ALT: 8 U/L (ref 0–44)
AST: 17 U/L (ref 15–41)
Albumin: 2.8 g/dL — ABNORMAL LOW (ref 3.5–5.0)
Alkaline Phosphatase: 71 U/L (ref 38–126)
Anion gap: 9 (ref 5–15)
BUN: 14 mg/dL (ref 8–23)
CO2: 22 mmol/L (ref 22–32)
Calcium: 8.5 mg/dL — ABNORMAL LOW (ref 8.9–10.3)
Chloride: 107 mmol/L (ref 98–111)
Creatinine: 0.72 mg/dL (ref 0.61–1.24)
GFR, Est AFR Am: >60 mL/min (ref >60)
GFR, Estimated: >60 mL/min (ref >60)
Glucose, Bld: 103 mg/dL — ABNORMAL HIGH (ref 70–99)
Potassium: 3.9 mmol/L (ref 3.5–5.1)
Sodium: 138 mmol/L (ref 135–145)
Total Bilirubin: 0.4 mg/dL (ref 0.3–1.2)
Total Protein: 5.6 g/dL — ABNORMAL LOW (ref 6.5–8.1)

## 2019-07-11 MED ORDER — PEGFILGRASTIM 6 MG/0.6ML ~~LOC~~ PSKT
PREFILLED_SYRINGE | SUBCUTANEOUS | Status: AC
  Filled 2019-07-11: qty 0.6

## 2019-07-11 MED ORDER — DEXAMETHASONE SODIUM PHOSPHATE 10 MG/ML IJ SOLN
INTRAMUSCULAR | Status: AC
  Filled 2019-07-11: qty 1

## 2019-07-11 MED ORDER — FAMOTIDINE IN NACL 20-0.9 MG/50ML-% IV SOLN
INTRAVENOUS | Status: AC
  Filled 2019-07-11: qty 50

## 2019-07-11 MED ORDER — HEPARIN SOD (PORK) LOCK FLUSH 100 UNIT/ML IV SOLN
500.0000 [IU] | Freq: Once | INTRAVENOUS | Status: AC | PRN
  Administered 2019-07-11: 500 [IU]
  Filled 2019-07-11: qty 5

## 2019-07-11 MED ORDER — DEXAMETHASONE SODIUM PHOSPHATE 10 MG/ML IJ SOLN
10.0000 mg | Freq: Once | INTRAMUSCULAR | Status: AC
  Administered 2019-07-11: 10 mg via INTRAVENOUS

## 2019-07-11 MED ORDER — SODIUM CHLORIDE 0.9% FLUSH
10.0000 mL | Freq: Once | INTRAVENOUS | Status: AC
  Administered 2019-07-11: 10 mL
  Filled 2019-07-11: qty 10

## 2019-07-11 MED ORDER — PALONOSETRON HCL INJECTION 0.25 MG/5ML
INTRAVENOUS | Status: AC
  Filled 2019-07-11: qty 5

## 2019-07-11 MED ORDER — SODIUM CHLORIDE 0.9 % IV SOLN
Freq: Once | INTRAVENOUS | Status: AC
  Administered 2019-07-11: 09:00:00 via INTRAVENOUS
  Filled 2019-07-11: qty 250

## 2019-07-11 MED ORDER — DIPHENHYDRAMINE HCL 50 MG/ML IJ SOLN
INTRAMUSCULAR | Status: AC
  Filled 2019-07-11: qty 1

## 2019-07-11 MED ORDER — FAMOTIDINE IN NACL 20-0.9 MG/50ML-% IV SOLN
20.0000 mg | Freq: Once | INTRAVENOUS | Status: AC
  Administered 2019-07-11: 20 mg via INTRAVENOUS

## 2019-07-11 MED ORDER — SODIUM CHLORIDE 0.9% FLUSH
10.0000 mL | INTRAVENOUS | Status: DC | PRN
  Administered 2019-07-11: 10 mL
  Filled 2019-07-11: qty 10

## 2019-07-11 MED ORDER — SODIUM CHLORIDE 0.9 % IV SOLN
20.0000 mg/m2 | Freq: Once | INTRAVENOUS | Status: AC
  Administered 2019-07-11: 10:00:00 42 mg via INTRAVENOUS
  Filled 2019-07-11: qty 4.2

## 2019-07-11 MED ORDER — DIPHENHYDRAMINE HCL 50 MG/ML IJ SOLN
25.0000 mg | Freq: Once | INTRAMUSCULAR | Status: AC
  Administered 2019-07-11: 25 mg via INTRAVENOUS

## 2019-07-11 MED ORDER — PEGFILGRASTIM 6 MG/0.6ML ~~LOC~~ PSKT
6.0000 mg | PREFILLED_SYRINGE | Freq: Once | SUBCUTANEOUS | Status: AC
  Administered 2019-07-11: 6 mg via SUBCUTANEOUS

## 2019-07-11 NOTE — Telephone Encounter (Signed)
Scheduled appt per 7/23 los.  Scheduled for 9/4 because patient wanted a morning appt and was able to add a lab for 9/3 due to their meeting time.  Patient was okay with the appt being on 9/4.  Printed calendar and avs.

## 2019-07-11 NOTE — Patient Instructions (Signed)
[facility] Discharge Instructions for Patients Receiving Chemotherapy  Today you received the following chemotherapy agents: [name]  To help prevent nausea and vomiting after your treatment, we encourage you to take your nausea medication as prescribed.   If you develop nausea and vomiting that is not controlled by your nausea medication, call the clinic.   BELOW ARE SYMPTOMS THAT SHOULD BE REPORTED IMMEDIATELY:  *FEVER GREATER THAN 100.5 F  *CHILLS WITH OR WITHOUT FEVER  NAUSEA AND VOMITING THAT IS NOT CONTROLLED WITH YOUR NAUSEA MEDICATION  *UNUSUAL SHORTNESS OF BREATH  *UNUSUAL BRUISING OR BLEEDING  TENDERNESS IN MOUTH AND THROAT WITH OR WITHOUT PRESENCE OF ULCERS  *URINARY PROBLEMS  *BOWEL PROBLEMS  UNUSUAL RASH Items with * indicate a potential emergency and should be followed up as soon as possible.  Feel free to call the clinic should you have any questions or concerns. The clinic phone number is [phone].  Please show the [employer] at check-in to the Emergency Department and triage nurse.

## 2019-07-11 NOTE — Progress Notes (Signed)
Hematology and Oncology Follow Up Visit  [name] [id number] Apr 24, 1941 78 y.o. 07/11/2019 8:09 AM [name], MDPharr, Thayer Jew, MD   Principle Diagnosis: 78 year old man with castration-resistant prostate cancer diagnosed in 2018.he presented with Gleason score of 5+4 = 9 and a PSA of 13.56 and subsequently developed a bone disease.  Prior Therapy: He was treated there with external beam radiation and brachii therapy in January 2017 and completed 2 years of androgen deprivation.  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  Metastatic work-up at that time showed bone involvement as well as lymphadenopathy.    He was treated under the care of Dr. [name] at [facility] initially with enzalutamide started in May 2018 until January 2019 where he presented with progression of disease with bony metastasis, adrenal nodule as well as worsening adenopathy.    Taxotere chemotherapy initially at 60 mg/m and that was reduced to 45 mg/m after cycle 6.  His last treatment was in July 2019 given at [facility].  Repeat imaging studies showed his lymphadenopathy has improved but he did develop worsening bony metastasis based on a lumbar spine obtained on June 24, 2018.  The MRI showed a complete replacement of L3 vertebral body with tumor the tumor in the ventral epidural space behind L3 greater to the left.  Bulky retroperitoneal adenopathy was slightly improved as mentioned.    He is status post decompressive laminectomy and removal of L3 tumor completed on 08/06/2018.  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   Zytiga 1000 mg daily started in September 2019.  Therapy discontinued in May 2020 for progression of disease.  He is status post stereotactic radiosurgery to the L4 vertebral body completed on May 10, 2019.  Current  therapy:  [name] chemotherapy 20 mg per metered square cycle 1 given on May 10, 2019.  He completed 3 cycles of therapy.  Androgen deprivation under the care of Dr. [name].   Xgeva 120 mg every 6 weeks.   Interim History: [name] is here for a repeat evaluation.  Since last visit, he reports no major changes in his health.  He continues to tolerate chemotherapy without any major concerns.  He does report some fatigue and tiredness but no nausea or vomiting.  His appetite remained excellent and his weight is up.  He is ambulating with the help of a cane without any recent falls or syncope.  He is stamina still limited at this time.   He denied any alteration mental status, neuropathy, confusion or dizziness.  Denies any headaches or lethargy.  Denies any night sweats, weight loss or changes in appetite.  Denied orthopnea, dyspnea on exertion or chest discomfort.  Denies shortness of breath, difficulty breathing hemoptysis or cough.  Denies any abdominal distention, nausea, early satiety or dyspepsia.  Denies any hematuria, frequency, dysuria or nocturia.  Denies any skin irritation, dryness or rash.  Denies any ecchymosis or petechiae.  Denies any lymphadenopathy or clotting.  Denies any heat or cold intolerance.  Denies any anxiety or depression.  Remaining review of system is negative.                 Medications: No changes noted today and medication updated. Current Outpatient Medications  Medication Sig Dispense Refill  . acetaminophen (TYLENOL) 500 MG tablet Take 500 mg by mouth daily. Pt takes 5  days after chemo    . Calcium Carb-Cholecalciferol (CALCIUM 1000 + D PO) Take 30 mLs by mouth daily. Calcium 1000 mg / Vit D 1000 iu    . cycloSPORINE (RESTASIS) 0.05 % ophthalmic emulsion Place 1 drop into both eyes 2 (two) times daily.    Marland Kitchen docusate sodium (COLACE) 100 MG capsule Take 100 mg by mouth 2 (two) times daily.    Marland Kitchen enoxaparin (LOVENOX) 80 MG/0.8ML injection Inject 0.8  mLs (80 mg total) into the skin 2 (two) times daily for 30 days. 60 Syringe 1  . ezetimibe (ZETIA) 10 MG tablet Take 10 mg by mouth every morning.     Marland Kitchen ibuprofen (ADVIL,MOTRIN) 200 MG tablet Take 200 mg by mouth every 6 (six) hours as needed for moderate pain.    Marland Kitchen leuprolide (LUPRON) 30 MG injection Inject 45 mg into the muscle every 6 (six) months.    . lidocaine-prilocaine (EMLA) cream Apply 1 application topically as needed. 30 g 0  . Loratadine (CLARITIN) 10 MG CAPS Take 10 mg by mouth daily. Patient takes every day for 5 days after chemo treatment.    . ondansetron (ZOFRAN) 4 MG tablet Take 4 mg by mouth every 8 (eight) hours as needed for nausea or vomiting.    Marland Kitchen oxyCODONE (OXY IR/ROXICODONE) 5 MG immediate release tablet Take 1-2 tablets (5-10 mg total) by mouth every 4 (four) hours as needed for severe pain. 90 tablet 0  . pantoprazole (PROTONIX) 40 MG tablet Take 40 mg by mouth daily after supper.     . polyethylene glycol (MIRALAX / GLYCOLAX) packet Take 17 g by mouth daily as needed for mild constipation. 14 each 0  . predniSONE (DELTASONE) 5 MG tablet TAKE 1 TABLET BY MOUTH 2 TIMES DAILY WITH A MEAL. 60 tablet 2  . prochlorperazine (COMPAZINE) 10 MG tablet Take 1 tablet (10 mg total) by mouth every 6 (six) hours as needed for nausea or vomiting. (Patient taking differently: Take 5 mg by mouth 2 (two) times a day. ) 30 tablet 0  . rivaroxaban (XARELTO) 20 MG TABS tablet Take 1 tablet (20 mg total) by mouth daily with supper for 30 days. 30 tablet 3  . tamsulosin (FLOMAX) 0.4 MG CAPS capsule Take 0.4 mg by mouth daily.     . traMADol (ULTRAM) 50 MG tablet TAKE 1 TABLET BY MOUTH EVERY 6 HOURS AS NEEDED FOR PAIN 60 tablet 0  . triamcinolone cream (KENALOG) 0.1 % Apply 1 application topically daily as needed (dry skin in the winter).     Marland Kitchen ZYTIGA 250 MG tablet TAKE 4 TABLETS (1,000 MG TOTAL) BY MOUTH DAILY. TAKE ON AN EMPTY STOMACH 1 HOUR BEFORE OR 2 HOURS AFTER A MEAL (Patient not taking:  Reported on 05/18/2019) 120 tablet 0   No current facility-administered medications for this visit.      Allergies:  Allergies  Allergen Reactions  . Statins Other (See Comments)    Muscle pain    Past Medical History, Surgical history, Social history, and Family History remain without any change on my review.    Physical Exam:     Blood pressure 129/67, pulse 95, temperature 98 F (36.7 C), temperature source Oral, resp. rate 18, height 5\' 11"  (1.803 m), weight 186 lb (84.4 kg), SpO2 100 %.     ECOG: 1    General appearance: Alert, awake without any distress. Head: Atraumatic without abnormalities Oropharynx: Without any thrush or ulcers. Eyes: No scleral icterus. Lymph nodes: No lymphadenopathy noted  in the cervical, supraclavicular, or axillary nodes Heart:regular rate and rhythm, without any murmurs or gallops.   Lung: Clear to auscultation without any rhonchi, wheezes or dullness to percussion. Abdomin: Soft, nontender without any shifting dullness or ascites. Musculoskeletal: No clubbing or cyanosis. Neurological: No motor or sensory deficits. Skin: No rashes or lesions. Psychiatric: Mood and affect appeared normal.            Lab Results: Lab Results  Component Value Date   WBC 9.0 06/20/2019   HGB 10.0 (L) 06/20/2019   HCT 31.9 (L) 06/20/2019   MCV 96.4 06/20/2019   PLT 362 06/20/2019     Chemistry      Component Value Date/Time   NA 138 06/20/2019 0852   K 3.6 06/20/2019 0852   CL 107 06/20/2019 0852   CO2 22 06/20/2019 0852   BUN 15 06/20/2019 0852   CREATININE 0.97 06/20/2019 0852      Component Value Date/Time   CALCIUM 8.4 (L) 06/20/2019 0852   ALKPHOS 70 06/20/2019 0852   AST 14 (L) 06/20/2019 0852   ALT 9 06/20/2019 0852   BILITOT 0.3 06/20/2019 0852      Results for [name], [name] (MRN [id number]) as of 07/11/2019 08:12  Ref. Range 05/10/2019 08:17 05/30/2019 08:23 06/20/2019 08:52  Prostate Specific Ag, Serum Latest Ref  Range: 0.0 - 4.0 ng/mL 129.0 (H) 99.2 (H) 90.72 (H)     78 year old man with the:  1.  Castration-resistant prostate cancer with disease to the bone in addition to visceral metastasis including the liver diagnosed in 2018.    He completed 3 cycles of [name] chemotherapy with reasonable tolerance and no major complications.  His PSA continues to show reasonable decline at this time without any clinical signs of progression.  Risks and benefits of continuing this therapy was reviewed today.  Potential complications no nausea, myelosuppression, diarrhea and worsening neuropathy were reiterated.  After discussion he is agreeable to continue at this time.  Plan is to complete 10 cycles of therapy.  We will repeat imaging studies after cycle 5.  2.  Spinal metastasis: He completed radiation in May 2020 without any recent issues.  3.  Bone directed therapy: He is currently on Xgeva which I recommended continuing every 6 weeks.  Potential complications including hypocalcemia and osteonecrosis of the jaw were reiterated.   4.  Androgen deprivation: I recommended continuing androgen deprivation indefinitely at this time.  He is currently receiving it with his urologist.  5.  Pain: Manageable at this time and for the most part uses Ultram.  He does use oxycodone for severe pain which is made available to him.  He only uses oxycodone at nighttime.  6.  Antiemetics: No issues with nausea or vomiting at this time.  Antiemetics are available to him.  7.  IV access: Port-A-Cath remains in use without any issues.  8.  Growth factor support: He is at risk of developing neutropenia and neutropenic sepsis.  We will continue growth factor support after each cycle of therapy.  9.  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  No bleeding or thrombosis noted.  10.  Prognosis and goals of care: Therapy remains palliative although his performance status  remains adequate and aggressive measures are warranted.   11.  Follow-up: In 3 weeks for the next cycle of therapy.  25  minutes was spent with the patient face-to-face today.  More than 50% of time was spent  on reviewing his disease status, reviewing laboratory data, discussing treatment options and complications related therapy.    Zola Button, MD 7/23/20208:09 AM

## 2019-07-12 ENCOUNTER — Telehealth: Payer: Self-pay

## 2019-07-12 LAB — PROSTATE-SPECIFIC AG, SERUM (LABCORP): Prostate Specific Ag, Serum: 103 ng/mL — ABNORMAL HIGH (ref 0.0–4.0)

## 2019-07-12 NOTE — Telephone Encounter (Signed)
-----   Message from [name], MD sent at 07/12/2019  8:23 AM EDT ----- Please let him know his PSA is slightly up but no changes for now.

## 2019-07-12 NOTE — Telephone Encounter (Signed)
Called patient and let him know that per Dr. [name], PSA is slightly up but no changes for now. Verbalized understanding.

## 2019-07-14 ENCOUNTER — Other Ambulatory Visit: Payer: Self-pay | Admitting: Oncology

## 2019-07-26 ENCOUNTER — Other Ambulatory Visit: Payer: Self-pay | Admitting: Radiation Therapy

## 2019-07-26 ENCOUNTER — Other Ambulatory Visit: Payer: Self-pay | Admitting: Neurosurgery

## 2019-07-26 DIAGNOSIS — C7951 Secondary malignant neoplasm of bone: Secondary | ICD-10-CM

## 2019-08-01 ENCOUNTER — Inpatient Hospital Stay: Payer: Medicare Other | Attending: Oncology

## 2019-08-01 ENCOUNTER — Inpatient Hospital Stay: Payer: Medicare Other

## 2019-08-01 ENCOUNTER — Inpatient Hospital Stay (HOSPITAL_BASED_OUTPATIENT_CLINIC_OR_DEPARTMENT_OTHER): Payer: Medicare Other | Admitting: Oncology

## 2019-08-01 ENCOUNTER — Other Ambulatory Visit: Payer: Self-pay

## 2019-08-01 VITALS — HR 78

## 2019-08-01 VITALS — BP 115/71 | HR 109 | Temp 98.5°F | Resp 17 | Ht 71.0 in | Wt 184.3 lb

## 2019-08-01 DIAGNOSIS — C7951 Secondary malignant neoplasm of bone: Secondary | ICD-10-CM | POA: Insufficient documentation

## 2019-08-01 DIAGNOSIS — Z5189 Encounter for other specified aftercare: Secondary | ICD-10-CM | POA: Insufficient documentation

## 2019-08-01 DIAGNOSIS — C61 Malignant neoplasm of prostate: Secondary | ICD-10-CM

## 2019-08-01 DIAGNOSIS — Z79899 Other long term (current) drug therapy: Secondary | ICD-10-CM | POA: Insufficient documentation

## 2019-08-01 DIAGNOSIS — Z7901 Long term (current) use of anticoagulants: Secondary | ICD-10-CM | POA: Diagnosis not present

## 2019-08-01 DIAGNOSIS — Z791 Long term (current) use of non-steroidal anti-inflammatories (NSAID): Secondary | ICD-10-CM | POA: Diagnosis not present

## 2019-08-01 DIAGNOSIS — Z5111 Encounter for antineoplastic chemotherapy: Secondary | ICD-10-CM | POA: Diagnosis not present

## 2019-08-01 DIAGNOSIS — Z95828 Presence of other vascular implants and grafts: Secondary | ICD-10-CM

## 2019-08-01 LAB — CBC WITH DIFFERENTIAL (CANCER CENTER ONLY)
Abs Immature Granulocytes: 0.07 10*3/uL (ref 0.00–0.07)
Basophils Absolute: 0.1 10*3/uL (ref 0.0–0.1)
Basophils Relative: 1 %
Eosinophils Absolute: 0 10*3/uL (ref 0.0–0.5)
Eosinophils Relative: 0 %
HCT: 28.7 % — ABNORMAL LOW (ref 39.0–52.0)
Hemoglobin: 9.2 g/dL — ABNORMAL LOW (ref 13.0–17.0)
Immature Granulocytes: 1 %
Lymphocytes Relative: 4 %
Lymphs Abs: 0.4 10*3/uL — ABNORMAL LOW (ref 0.7–4.0)
MCH: 30.7 pg (ref 26.0–34.0)
MCHC: 32.1 g/dL (ref 30.0–36.0)
MCV: 95.7 fL (ref 80.0–100.0)
Monocytes Absolute: 1 10*3/uL (ref 0.1–1.0)
Monocytes Relative: 11 %
Neutro Abs: 7.6 10*3/uL (ref 1.7–7.7)
Neutrophils Relative %: 83 %
Platelet Count: 323 10*3/uL (ref 150–400)
RBC: 3 MIL/uL — ABNORMAL LOW (ref 4.22–5.81)
RDW: 18.1 % — ABNORMAL HIGH (ref 11.5–15.5)
WBC Count: 9.1 10*3/uL (ref 4.0–10.5)
nRBC: 0 % (ref 0.0–0.2)

## 2019-08-01 LAB — CMP (CANCER CENTER ONLY)
ALT: 10 U/L (ref 0–44)
AST: 19 U/L (ref 15–41)
Albumin: 2.9 g/dL — ABNORMAL LOW (ref 3.5–5.0)
Alkaline Phosphatase: 72 U/L (ref 38–126)
Anion gap: 10 (ref 5–15)
BUN: 21 mg/dL (ref 8–23)
CO2: 20 mmol/L — ABNORMAL LOW (ref 22–32)
Calcium: 8.8 mg/dL — ABNORMAL LOW (ref 8.9–10.3)
Chloride: 105 mmol/L (ref 98–111)
Creatinine: 0.8 mg/dL (ref 0.61–1.24)
GFR, Est AFR Am: >60 mL/min (ref >60)
GFR, Estimated: >60 mL/min (ref >60)
Glucose, Bld: 133 mg/dL — ABNORMAL HIGH (ref 70–99)
Potassium: 3.5 mmol/L (ref 3.5–5.1)
Sodium: 135 mmol/L (ref 135–145)
Total Bilirubin: 0.4 mg/dL (ref 0.3–1.2)
Total Protein: 5.7 g/dL — ABNORMAL LOW (ref 6.5–8.1)

## 2019-08-01 MED ORDER — FAMOTIDINE IN NACL 20-0.9 MG/50ML-% IV SOLN
20.0000 mg | Freq: Once | INTRAVENOUS | Status: AC
  Administered 2019-08-01: 10:00:00 20 mg via INTRAVENOUS

## 2019-08-01 MED ORDER — FAMOTIDINE IN NACL 20-0.9 MG/50ML-% IV SOLN
INTRAVENOUS | Status: AC
  Filled 2019-08-01: qty 50

## 2019-08-01 MED ORDER — DIPHENHYDRAMINE HCL 50 MG/ML IJ SOLN
INTRAMUSCULAR | Status: AC
  Filled 2019-08-01: qty 1

## 2019-08-01 MED ORDER — DIPHENHYDRAMINE HCL 50 MG/ML IJ SOLN
25.0000 mg | Freq: Once | INTRAMUSCULAR | Status: AC
  Administered 2019-08-01: 25 mg via INTRAVENOUS

## 2019-08-01 MED ORDER — SODIUM CHLORIDE 0.9 % IV SOLN
Freq: Once | INTRAVENOUS | Status: AC
  Administered 2019-08-01: 10:00:00 via INTRAVENOUS
  Filled 2019-08-01: qty 250

## 2019-08-01 MED ORDER — SODIUM CHLORIDE 0.9% FLUSH
10.0000 mL | Freq: Once | INTRAVENOUS | Status: AC
  Administered 2019-08-01: 10 mL
  Filled 2019-08-01: qty 10

## 2019-08-01 MED ORDER — PEGFILGRASTIM 6 MG/0.6ML ~~LOC~~ PSKT
PREFILLED_SYRINGE | SUBCUTANEOUS | Status: AC
  Filled 2019-08-01: qty 0.6

## 2019-08-01 MED ORDER — DEXAMETHASONE SODIUM PHOSPHATE 10 MG/ML IJ SOLN
10.0000 mg | Freq: Once | INTRAMUSCULAR | Status: AC
  Administered 2019-08-01: 10:00:00 10 mg via INTRAVENOUS

## 2019-08-01 MED ORDER — PEGFILGRASTIM 6 MG/0.6ML ~~LOC~~ PSKT
6.0000 mg | PREFILLED_SYRINGE | Freq: Once | SUBCUTANEOUS | Status: AC
  Administered 2019-08-01: 12:00:00 6 mg via SUBCUTANEOUS

## 2019-08-01 MED ORDER — HEPARIN SOD (PORK) LOCK FLUSH 100 UNIT/ML IV SOLN
500.0000 [IU] | Freq: Once | INTRAVENOUS | Status: AC | PRN
  Administered 2019-08-01: 500 [IU]
  Filled 2019-08-01: qty 5

## 2019-08-01 MED ORDER — SODIUM CHLORIDE 0.9% FLUSH
10.0000 mL | INTRAVENOUS | Status: DC | PRN
  Administered 2019-08-01: 12:00:00 10 mL
  Filled 2019-08-01: qty 10

## 2019-08-01 MED ORDER — DEXAMETHASONE SODIUM PHOSPHATE 10 MG/ML IJ SOLN
INTRAMUSCULAR | Status: AC
  Filled 2019-08-01: qty 1

## 2019-08-01 MED ORDER — SODIUM CHLORIDE 0.9 % IV SOLN
20.0000 mg/m2 | Freq: Once | INTRAVENOUS | Status: AC
  Administered 2019-08-01: 42 mg via INTRAVENOUS
  Filled 2019-08-01: qty 4.2

## 2019-08-01 NOTE — Progress Notes (Signed)
Hematology and Oncology Follow Up Visit  [name] [id number] 12-30-1940 78 y.o. 08/01/2019 9:03 AM [name], MDPharr, Thayer Jew, MD   Principle Diagnosis: 78 year old man with advanced prostate cancer with disease to the bone diagnosed in 2018.  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   Prior Therapy: He was treated there with external beam radiation and brachii therapy in January 2017 and completed 2 years of androgen deprivation.  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  Metastatic work-up at that time showed bone involvement as well as lymphadenopathy.    He was treated under the care of Dr. [name] at [facility] initially with enzalutamide started in May 2018 until January 2019 where he presented with progression of disease with bony metastasis, adrenal nodule as well as worsening adenopathy.    Taxotere chemotherapy initially at 60 mg/m and that was reduced to 45 mg/m after cycle 6.  His last treatment was in July 2019 given at [facility].  Repeat imaging studies showed his lymphadenopathy has improved but he did develop worsening bony metastasis based on a lumbar spine obtained on June 24, 2018.  The MRI showed a complete replacement of L3 vertebral body with tumor the tumor in the ventral epidural space behind L3 greater to the left.  Bulky retroperitoneal adenopathy was slightly improved as mentioned.    He is status post decompressive laminectomy and removal of L3 tumor completed on 08/06/2018.  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   Zytiga 1000 mg daily started in September 2019.  Therapy discontinued in May 2020 for progression of disease.  He is status post stereotactic radiosurgery to the L4 vertebral body completed on May 10, 2019.  Current  therapy:  [name] chemotherapy 20 mg per metered square cycle 1 given on May 10, 2019.  He is here for cycle 5 of therapy.  Androgen deprivation under the care of Dr. [name].   Xgeva 120 mg every 6 weeks.  Last injection was given on June 20, 2019.  Interim History: [name] returns today for a repeat evaluation.  Since the last visit, he reports no major changes in his health.  He continues to have some weakness and pain in his back and legs.  He denies any changes in the quality as well as the severity of his discomfort.  He uses tramadol during the day and oxycodone at nighttime.  He ambulates short distances although his stamina is limited.  He did report some hoarseness today but without any fevers, cough or arthralgias.  He denies any shortness of breath or difficulty breathing.  He continues to tolerate chemotherapy without any major complications.  Denies any worsening neuropathy or diarrhea.   Patient denied headaches, blurry vision, syncope or seizures.  Denies any fevers, chills or sweats.  Denied chest pain, palpitation, orthopnea or leg edema.  Denied cough, wheezing or hemoptysis.  Denied nausea, vomiting or abdominal pain.  Denies any constipation or diarrhea.  Denies any frequency urgency or hesitancy.  Denies any arthralgias or myalgias.  Denies any skin rashes or lesions.  Denies any bleeding or clotting tendency.  Denies any easy bruising.  Denies any hair or nail changes.  Denies any anxiety or depression.  Remaining review of system is negative.  Medications: Updated on review. Current Outpatient Medications  Medication Sig Dispense Refill  . acetaminophen (TYLENOL) 500 MG tablet Take 500 mg by mouth daily. Pt takes 5 days after chemo    . Calcium Carb-Cholecalciferol (CALCIUM 1000 + D PO) Take 30 mLs by mouth daily. Calcium 1000 mg / Vit D 1000 iu    . cycloSPORINE (RESTASIS) 0.05 % ophthalmic emulsion Place 1 drop into both eyes 2 (two)  times daily.    Marland Kitchen docusate sodium (COLACE) 100 MG capsule Take 100 mg by mouth 2 (two) times daily.    Marland Kitchen enoxaparin (LOVENOX) 80 MG/0.8ML injection Inject 0.8 mLs (80 mg total) into the skin 2 (two) times daily for 30 days. 60 Syringe 1  . ezetimibe (ZETIA) 10 MG tablet Take 10 mg by mouth every morning.     Marland Kitchen ibuprofen (ADVIL,MOTRIN) 200 MG tablet Take 200 mg by mouth every 6 (six) hours as needed for moderate pain.    Marland Kitchen leuprolide (LUPRON) 30 MG injection Inject 45 mg into the muscle every 6 (six) months.    . lidocaine-prilocaine (EMLA) cream Apply 1 application topically as needed. 30 g 0  . Loratadine (CLARITIN) 10 MG CAPS Take 10 mg by mouth daily. Patient takes every day for 5 days after chemo treatment.    . ondansetron (ZOFRAN) 4 MG tablet Take 4 mg by mouth every 8 (eight) hours as needed for nausea or vomiting.    Marland Kitchen oxyCODONE (OXY IR/ROXICODONE) 5 MG immediate release tablet Take 1-2 tablets (5-10 mg total) by mouth every 4 (four) hours as needed for severe pain. 90 tablet 0  . pantoprazole (PROTONIX) 40 MG tablet Take 40 mg by mouth daily after supper.     . polyethylene glycol (MIRALAX / GLYCOLAX) packet Take 17 g by mouth daily as needed for mild constipation. 14 each 0  . predniSONE (DELTASONE) 5 MG tablet TAKE 1 TABLET BY MOUTH 2 TIMES DAILY WITH A MEAL. 60 tablet 2  . prochlorperazine (COMPAZINE) 10 MG tablet Take 1 tablet (10 mg total) by mouth every 6 (six) hours as needed for nausea or vomiting. (Patient taking differently: Take 5 mg by mouth 2 (two) times a day. ) 30 tablet 0  . rivaroxaban (XARELTO) 20 MG TABS tablet Take 1 tablet (20 mg total) by mouth daily with supper for 30 days. 30 tablet 3  . tamsulosin (FLOMAX) 0.4 MG CAPS capsule Take 0.4 mg by mouth daily.     . traMADol (ULTRAM) 50 MG tablet TAKE 1 TABLET BY MOUTH EVERY 6 HOURS AS NEEDED FOR PAIN 60 tablet 0  . triamcinolone cream (KENALOG) 0.1 % Apply 1 application topically daily as needed (dry skin in the winter).      Marland Kitchen ZYTIGA 250 MG tablet TAKE 4 TABLETS (1,000 MG TOTAL) BY MOUTH DAILY. TAKE ON AN EMPTY STOMACH 1 HOUR BEFORE OR 2 HOURS AFTER A MEAL (Patient not taking: Reported on 05/18/2019) 120 tablet 0   No current facility-administered medications for this visit.      Allergies:  Allergies  Allergen Reactions  . Statins Other (See Comments)    Muscle pain    Past Medical History, Surgical history, Social history, and Family History updated without any changes.    Physical Exam:  Blood pressure 115/71, pulse (!) 109, temperature 98.5 F (36.9 C), temperature source Temporal, resp. rate 17, height 5\' 11"  (1.803 m), weight 184 lb 5 oz (83.6 kg), SpO2 100 %.  ECOG: 1   General appearance: Comfortable appearing without  any discomfort Head: Normocephalic without any trauma Oropharynx: Mucous membranes are moist and pink without any thrush or ulcers. Eyes: Pupils are equal and round reactive to light. Lymph nodes: No cervical, supraclavicular, inguinal or axillary lymphadenopathy.   Heart:regular rate and rhythm.  S1 and S2 without leg edema. Lung: Clear without any rhonchi or wheezes.  No dullness to percussion. Abdomin: Soft, nontender, nondistended with good bowel sounds.  No hepatosplenomegaly. Musculoskeletal: No joint deformity or effusion.  Full range of motion noted. Neurological: No deficits noted on motor, sensory and deep tendon reflex exam. Skin: No petechial rash or dryness.  Appeared moist.          Results for ELIAZER, HEMPHILL (MRN [id number]) as of 08/01/2019 09:05  Ref. Range 05/10/2019 08:17 05/30/2019 08:23 06/20/2019 08:52 07/11/2019 08:07  Prostate Specific Ag, Serum Latest Ref Range: 0.0 - 4.0 ng/mL 129.0 (H) 99.2 (H) 90.8 (H) 103.0 (H)      Lab Results: Lab Results  Component Value Date   WBC 7.2 07/11/2019   HGB 9.7 (L) 07/11/2019   HCT 30.9 (L) 07/11/2019   MCV 97.5 07/11/2019   PLT 311 07/11/2019     Chemistry      Component Value Date/Time   NA  138 07/11/2019 0807   K 3.9 07/11/2019 0807   CL 107 07/11/2019 0807   CO2 22 07/11/2019 0807   BUN 14 07/11/2019 0807   CREATININE 0.72 07/11/2019 0807      Component Value Date/Time   CALCIUM 8.5 (L) 07/11/2019 0807   ALKPHOS 71 07/11/2019 0807   AST 17 07/11/2019 0807   ALT 8 07/11/2019 0807   BILITOT 0.4 07/11/2019 0807          78 year old man with:   1.  Advanced prostate cancer that is currently castration-resistant with hepatic involvement as well.  He remains on palliative chemotherapy utilizing [name] without any major complications.  He completed 4 cycles of therapy with stabilization of his PSA and slight decline.  Risks and benefits of continuing this therapy long-term was discussed and additional options of therapy were reviewed.  These would include platinum based chemotherapy in addition to [facility] as well as other oral targeted therapy after obtaining tissue biopsy depending on his disease status.  He is agreeable to proceed at this time we will obtain a CT scan prior to the next cycle to evaluate response.    2.  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  Status post radiation therapy.  Repeat MRI scheduled for September 2020.  3.  Bone directed therapy: Long-term complication associated with Delton See was discussed today.  Needs include osteonecrosis of the jaw and hypocalcemia.  Risks and benefits of continuing this medication was reviewed today and he is agreeable to continue.   4.  Androgen deprivation: He is currently receiving that under the care of Dr. [name].  I recommended continuing this indefinitely.  5.  Pain: Related to cancer in the bone.  He does use Ultram and occasionally oxycodone at nighttime.  6.  Nausea related to chemotherapy: Manageable at this time with the current antiemetics.  7.  IV access: Port-A-Cath will be in use and flushed periodically.  8.  Growth factor support: He will receive growth factor support  after each cycle of therapy given his high risk of neutropenia.  9.  Venous thromboembolism: I recommended continuing Xarelto at this time given his risk of recurrent thrombosis.  No bleeding complications noted at this time.  10.  Prognosis  and goals of care: His disease remains incurable and any therapy is palliative.  Performance status is adequate and aggressive measures are warranted.   11.  Follow-up: He will return in 3 weeks for repeat evaluation prior to his next cycle of therapy.  25  minutes was spent with the patient face-to-face today.  More than 50% of time was dedicated to updating his disease status, treatment options, complication related therapy and answering questions regarding plan of care.    Zola Button, MD 8/13/20209:03 AM

## 2019-08-01 NOTE — Patient Instructions (Signed)
[facility] Discharge Instructions for Patients Receiving Chemotherapy  Today you received the following chemotherapy agents: [name]  To help prevent nausea and vomiting after your treatment, we encourage you to take your nausea medication as prescribed.   If you develop nausea and vomiting that is not controlled by your nausea medication, call the clinic.   BELOW ARE SYMPTOMS THAT SHOULD BE REPORTED IMMEDIATELY:  *FEVER GREATER THAN 100.5 F  *CHILLS WITH OR WITHOUT FEVER  NAUSEA AND VOMITING THAT IS NOT CONTROLLED WITH YOUR NAUSEA MEDICATION  *UNUSUAL SHORTNESS OF BREATH  *UNUSUAL BRUISING OR BLEEDING  TENDERNESS IN MOUTH AND THROAT WITH OR WITHOUT PRESENCE OF ULCERS  *URINARY PROBLEMS  *BOWEL PROBLEMS  UNUSUAL RASH Items with * indicate a potential emergency and should be followed up as soon as possible.  Feel free to call the clinic should you have any questions or concerns. The clinic phone number is [phone].  Please show the [facility] at check-in to the Emergency Department and triage nurse.

## 2019-08-02 ENCOUNTER — Telehealth: Payer: Self-pay | Admitting: Oncology

## 2019-08-02 ENCOUNTER — Telehealth: Payer: Self-pay

## 2019-08-02 LAB — PROSTATE-SPECIFIC AG, SERUM (LABCORP): Prostate Specific Ag, Serum: 113 ng/mL — ABNORMAL HIGH (ref 0.0–4.0)

## 2019-08-02 NOTE — Telephone Encounter (Signed)
-----   Message from [name], MD sent at 08/02/2019  8:20 AM EDT ----- Please let him know his PSA is slightly up but no changes for now.

## 2019-08-02 NOTE — Telephone Encounter (Signed)
Contacted patient and made aware of PSA results. 

## 2019-08-02 NOTE — Telephone Encounter (Signed)
Called and spoke with patient. Confirmed 9/24 appt  

## 2019-08-18 ENCOUNTER — Other Ambulatory Visit: Payer: Self-pay | Admitting: Oncology

## 2019-08-20 ENCOUNTER — Ambulatory Visit (HOSPITAL_COMMUNITY)
Admission: RE | Admit: 2019-08-20 | Discharge: 2019-08-20 | Disposition: A | Discharge status: Home / Self Care | Payer: Medicare Other | Source: Ambulatory Visit | Attending: Oncology | Admitting: Oncology

## 2019-08-20 ENCOUNTER — Other Ambulatory Visit: Payer: Self-pay | Admitting: Oncology

## 2019-08-20 ENCOUNTER — Other Ambulatory Visit: Payer: Self-pay

## 2019-08-20 ENCOUNTER — Encounter (HOSPITAL_COMMUNITY): Payer: Self-pay

## 2019-08-20 DIAGNOSIS — C7951 Secondary malignant neoplasm of bone: Secondary | ICD-10-CM | POA: Diagnosis not present

## 2019-08-20 DIAGNOSIS — C61 Malignant neoplasm of prostate: Secondary | ICD-10-CM | POA: Diagnosis not present

## 2019-08-20 MED ORDER — SODIUM CHLORIDE (PF) 0.9 % IJ SOLN
INTRAMUSCULAR | Status: AC
  Filled 2019-08-20: qty 50

## 2019-08-20 MED ORDER — IOHEXOL 300 MG/ML  SOLN
100.0000 mL | Freq: Once | INTRAMUSCULAR | Status: AC | PRN
  Administered 2019-08-20: 14:00:00 100 mL via INTRAVENOUS

## 2019-08-20 MED ORDER — HEPARIN SOD (PORK) LOCK FLUSH 100 UNIT/ML IV SOLN
500.0000 [IU] | Freq: Once | INTRAVENOUS | Status: AC
  Administered 2019-08-20: 14:00:00 500 [IU] via INTRAVENOUS

## 2019-08-20 MED ORDER — HEPARIN SOD (PORK) LOCK FLUSH 100 UNIT/ML IV SOLN
INTRAVENOUS | Status: AC
  Filled 2019-08-20: qty 5

## 2019-08-23 ENCOUNTER — Inpatient Hospital Stay: Payer: Medicare Other

## 2019-08-23 ENCOUNTER — Other Ambulatory Visit: Payer: Self-pay

## 2019-08-23 ENCOUNTER — Inpatient Hospital Stay: Payer: Medicare Other | Attending: Oncology

## 2019-08-23 ENCOUNTER — Inpatient Hospital Stay (HOSPITAL_BASED_OUTPATIENT_CLINIC_OR_DEPARTMENT_OTHER): Payer: Medicare Other | Admitting: Oncology

## 2019-08-23 VITALS — BP 152/88 | HR 102 | Temp 98.2°F | Resp 18 | Wt 177.0 lb

## 2019-08-23 VITALS — HR 96 | Resp 19

## 2019-08-23 DIAGNOSIS — Z9221 Personal history of antineoplastic chemotherapy: Secondary | ICD-10-CM | POA: Insufficient documentation

## 2019-08-23 DIAGNOSIS — C61 Malignant neoplasm of prostate: Secondary | ICD-10-CM

## 2019-08-23 DIAGNOSIS — R490 Dysphonia: Secondary | ICD-10-CM | POA: Insufficient documentation

## 2019-08-23 DIAGNOSIS — Z7901 Long term (current) use of anticoagulants: Secondary | ICD-10-CM | POA: Diagnosis not present

## 2019-08-23 DIAGNOSIS — Z5189 Encounter for other specified aftercare: Secondary | ICD-10-CM | POA: Diagnosis not present

## 2019-08-23 DIAGNOSIS — Z79891 Long term (current) use of opiate analgesic: Secondary | ICD-10-CM | POA: Diagnosis not present

## 2019-08-23 DIAGNOSIS — C7951 Secondary malignant neoplasm of bone: Secondary | ICD-10-CM | POA: Insufficient documentation

## 2019-08-23 DIAGNOSIS — C787 Secondary malignant neoplasm of liver and intrahepatic bile duct: Secondary | ICD-10-CM | POA: Diagnosis not present

## 2019-08-23 DIAGNOSIS — Z86718 Personal history of other venous thrombosis and embolism: Secondary | ICD-10-CM | POA: Diagnosis not present

## 2019-08-23 DIAGNOSIS — R630 Anorexia: Secondary | ICD-10-CM | POA: Diagnosis not present

## 2019-08-23 DIAGNOSIS — G893 Neoplasm related pain (acute) (chronic): Secondary | ICD-10-CM | POA: Insufficient documentation

## 2019-08-23 DIAGNOSIS — C778 Secondary and unspecified malignant neoplasm of lymph nodes of multiple regions: Secondary | ICD-10-CM | POA: Diagnosis not present

## 2019-08-23 DIAGNOSIS — Z923 Personal history of irradiation: Secondary | ICD-10-CM | POA: Diagnosis not present

## 2019-08-23 DIAGNOSIS — Z95828 Presence of other vascular implants and grafts: Secondary | ICD-10-CM

## 2019-08-23 DIAGNOSIS — Z79899 Other long term (current) drug therapy: Secondary | ICD-10-CM | POA: Diagnosis not present

## 2019-08-23 DIAGNOSIS — Z5111 Encounter for antineoplastic chemotherapy: Secondary | ICD-10-CM | POA: Diagnosis not present

## 2019-08-23 LAB — CBC WITH DIFFERENTIAL (CANCER CENTER ONLY)
Abs Immature Granulocytes: 0.04 10*3/uL (ref 0.00–0.07)
Basophils Absolute: 0 10*3/uL (ref 0.0–0.1)
Basophils Relative: 0 %
Eosinophils Absolute: 0 10*3/uL (ref 0.0–0.5)
Eosinophils Relative: 0 %
HCT: 28.4 % — ABNORMAL LOW (ref 39.0–52.0)
Hemoglobin: 8.9 g/dL — ABNORMAL LOW (ref 13.0–17.0)
Immature Granulocytes: 0 %
Lymphocytes Relative: 4 %
Lymphs Abs: 0.4 10*3/uL — ABNORMAL LOW (ref 0.7–4.0)
MCH: 29.9 pg (ref 26.0–34.0)
MCHC: 31.3 g/dL (ref 30.0–36.0)
MCV: 95.3 fL (ref 80.0–100.0)
Monocytes Absolute: 1.1 10*3/uL — ABNORMAL HIGH (ref 0.1–1.0)
Monocytes Relative: 10 %
Neutro Abs: 9.4 10*3/uL — ABNORMAL HIGH (ref 1.7–7.7)
Neutrophils Relative %: 86 %
Platelet Count: 322 10*3/uL (ref 150–400)
RBC: 2.98 MIL/uL — ABNORMAL LOW (ref 4.22–5.81)
RDW: 17.9 % — ABNORMAL HIGH (ref 11.5–15.5)
WBC Count: 11 10*3/uL — ABNORMAL HIGH (ref 4.0–10.5)
nRBC: 0 % (ref 0.0–0.2)

## 2019-08-23 LAB — CMP (CANCER CENTER ONLY)
ALT: 11 U/L (ref 0–44)
AST: 22 U/L (ref 15–41)
Albumin: 3 g/dL — ABNORMAL LOW (ref 3.5–5.0)
Alkaline Phosphatase: 74 U/L (ref 38–126)
Anion gap: 11 (ref 5–15)
BUN: 16 mg/dL (ref 8–23)
CO2: 20 mmol/L — ABNORMAL LOW (ref 22–32)
Calcium: 8.9 mg/dL (ref 8.9–10.3)
Chloride: 102 mmol/L (ref 98–111)
Creatinine: 0.75 mg/dL (ref 0.61–1.24)
GFR, Est AFR Am: >60 mL/min (ref >60)
GFR, Estimated: >60 mL/min (ref >60)
Glucose, Bld: 114 mg/dL — ABNORMAL HIGH (ref 70–99)
Potassium: 4.2 mmol/L (ref 3.5–5.1)
Sodium: 133 mmol/L — ABNORMAL LOW (ref 135–145)
Total Bilirubin: 0.4 mg/dL (ref 0.3–1.2)
Total Protein: 5.8 g/dL — ABNORMAL LOW (ref 6.5–8.1)

## 2019-08-23 MED ORDER — MEGESTROL ACETATE 400 MG/10ML PO SUSP: 400.0000 mg | Freq: Two times a day (BID) | ORAL | 0 refills | Status: AC

## 2019-08-23 MED ORDER — DENOSUMAB 120 MG/1.7ML ~~LOC~~ SOLN
120.0000 mg | Freq: Once | SUBCUTANEOUS | Status: AC
  Administered 2019-08-23: 120 mg via SUBCUTANEOUS

## 2019-08-23 MED ORDER — SODIUM CHLORIDE 0.9 % IV SOLN
20.0000 mg/m2 | Freq: Once | INTRAVENOUS | Status: AC
  Administered 2019-08-23: 42 mg via INTRAVENOUS
  Filled 2019-08-23: qty 4.2

## 2019-08-23 MED ORDER — PEGFILGRASTIM 6 MG/0.6ML ~~LOC~~ PSKT
PREFILLED_SYRINGE | SUBCUTANEOUS | Status: AC
  Filled 2019-08-23: qty 0.6

## 2019-08-23 MED ORDER — HEPARIN SOD (PORK) LOCK FLUSH 100 UNIT/ML IV SOLN
500.0000 [IU] | Freq: Once | INTRAVENOUS | Status: AC | PRN
  Administered 2019-08-23: 500 [IU]
  Filled 2019-08-23: qty 5

## 2019-08-23 MED ORDER — FAMOTIDINE IN NACL 20-0.9 MG/50ML-% IV SOLN
INTRAVENOUS | Status: AC
  Filled 2019-08-23: qty 50

## 2019-08-23 MED ORDER — FAMOTIDINE IN NACL 20-0.9 MG/50ML-% IV SOLN
20.0000 mg | Freq: Once | INTRAVENOUS | Status: AC
  Administered 2019-08-23: 20 mg via INTRAVENOUS

## 2019-08-23 MED ORDER — PEGFILGRASTIM 6 MG/0.6ML ~~LOC~~ PSKT
6.0000 mg | PREFILLED_SYRINGE | Freq: Once | SUBCUTANEOUS | Status: AC
  Administered 2019-08-23: 6 mg via SUBCUTANEOUS

## 2019-08-23 MED ORDER — SODIUM CHLORIDE 0.9% FLUSH
10.0000 mL | INTRAVENOUS | Status: DC | PRN
  Administered 2019-08-23: 10 mL
  Filled 2019-08-23: qty 10

## 2019-08-23 MED ORDER — SODIUM CHLORIDE 0.9% FLUSH
10.0000 mL | Freq: Once | INTRAVENOUS | Status: AC
  Administered 2019-08-23: 08:00:00 10 mL
  Filled 2019-08-23: qty 10

## 2019-08-23 MED ORDER — DIPHENHYDRAMINE HCL 50 MG/ML IJ SOLN
INTRAMUSCULAR | Status: AC
  Filled 2019-08-23: qty 1

## 2019-08-23 MED ORDER — DIPHENHYDRAMINE HCL 50 MG/ML IJ SOLN
25.0000 mg | Freq: Once | INTRAMUSCULAR | Status: AC
  Administered 2019-08-23: 25 mg via INTRAVENOUS

## 2019-08-23 MED ORDER — DENOSUMAB 120 MG/1.7ML ~~LOC~~ SOLN
SUBCUTANEOUS | Status: AC
  Filled 2019-08-23: qty 1.7

## 2019-08-23 MED ORDER — SODIUM CHLORIDE 0.9 % IV SOLN
Freq: Once | INTRAVENOUS | Status: AC
  Administered 2019-08-23: 09:00:00 via INTRAVENOUS
  Filled 2019-08-23: qty 250

## 2019-08-23 MED ORDER — DEXAMETHASONE SODIUM PHOSPHATE 10 MG/ML IJ SOLN
INTRAMUSCULAR | Status: AC
  Filled 2019-08-23: qty 1

## 2019-08-23 MED ORDER — OXYCODONE HCL 5 MG PO TABS: 5.0000 mg | ORAL_TABLET | ORAL | 0 refills | Status: AC | PRN

## 2019-08-23 MED ORDER — DEXAMETHASONE SODIUM PHOSPHATE 10 MG/ML IJ SOLN
10.0000 mg | Freq: Once | INTRAMUSCULAR | Status: AC
  Administered 2019-08-23: 10 mg via INTRAVENOUS

## 2019-08-23 NOTE — Patient Instructions (Signed)
[facility] Discharge Instructions for Patients Receiving Chemotherapy  Today you received the following chemotherapy agents: Cabazitaxel ([name])  To help prevent nausea and vomiting after your treatment, we encourage you to take your nausea medication as directed.   If you develop nausea and vomiting that is not controlled by your nausea medication, call the clinic.   BELOW ARE SYMPTOMS THAT SHOULD BE REPORTED IMMEDIATELY:  *FEVER GREATER THAN 100.5 F  *CHILLS WITH OR WITHOUT FEVER  NAUSEA AND VOMITING THAT IS NOT CONTROLLED WITH YOUR NAUSEA MEDICATION  *UNUSUAL SHORTNESS OF BREATH  *UNUSUAL BRUISING OR BLEEDING  TENDERNESS IN MOUTH AND THROAT WITH OR WITHOUT PRESENCE OF ULCERS  *URINARY PROBLEMS  *BOWEL PROBLEMS  UNUSUAL RASH Items with * indicate a potential emergency and should be followed up as soon as possible.  Feel free to call the clinic should you have any questions or concerns. The clinic phone number is [phone].  Please show the [facility] at check-in to the Emergency Department and triage nurse.  Denosumab injection What is this medicine? DENOSUMAB (den oh sue mab) slows bone breakdown. Prolia is used to treat osteoporosis in women after menopause and in men, and in people who are taking corticosteroids for 6 months or more. Delton See is used to treat a high calcium level due to cancer and to prevent bone fractures and other bone problems caused by multiple myeloma or cancer bone metastases. Delton See is also used to treat giant cell tumor of the bone. This medicine may be used for other purposes; ask your health care provider or pharmacist if you have questions. COMMON BRAND NAME(S): Prolia, XGEVA What should I tell my health care provider before I take this medicine? They need to know if you have any of these conditions:  dental disease  having surgery or tooth extraction  infection  kidney disease  low levels of calcium or Vitamin  D in the blood  malnutrition  on hemodialysis  skin conditions or sensitivity  thyroid or parathyroid disease  an unusual reaction to denosumab, other medicines, foods, dyes, or preservatives  pregnant or trying to get pregnant  breast-feeding How should I use this medicine? This medicine is for injection under the skin. It is given by a health care professional in a hospital or clinic setting. A special MedGuide will be given to you before each treatment. Be sure to read this information carefully each time. For Prolia, talk to your pediatrician regarding the use of this medicine in children. Special care may be needed. For Delton See, talk to your pediatrician regarding the use of this medicine in children. While this drug may be prescribed for children as young as 13 years for selected conditions, precautions do apply. Overdosage: If you think you have taken too much of this medicine contact a poison control center or emergency room at once. NOTE: This medicine is only for you. Do not share this medicine with others. What if I miss a dose? It is important not to miss your dose. Call your doctor or health care professional if you are unable to keep an appointment. What may interact with this medicine? Do not take this medicine with any of the following medications:  other medicines containing denosumab This medicine may also interact with the following medications:  medicines that lower your chance of fighting infection  steroid medicines like prednisone or cortisone This list may not describe all possible interactions. Give your health care provider a list of all the medicines, herbs,  non-prescription drugs, or dietary supplements you use. Also tell them if you smoke, drink alcohol, or use illegal drugs. Some items may interact with your medicine. What should I watch for while using this medicine? Visit your doctor or health care professional for regular checks on your progress. Your  doctor or health care professional may order blood tests and other tests to see how you are doing. Call your doctor or health care professional for advice if you get a fever, chills or sore throat, or other symptoms of a cold or flu. Do not treat yourself. This drug may decrease your body's ability to fight infection. Try to avoid being around people who are sick. You should make sure you get enough calcium and vitamin D while you are taking this medicine, unless your doctor tells you not to. Discuss the foods you eat and the vitamins you take with your health care professional. See your dentist regularly. Brush and floss your teeth as directed. Before you have any dental work done, tell your dentist you are receiving this medicine. Do not become pregnant while taking this medicine or for 5 months after stopping it. Talk with your doctor or health care professional about your birth control options while taking this medicine. Women should inform their doctor if they wish to become pregnant or think they might be pregnant. There is a potential for serious side effects to an unborn child. Talk to your health care professional or pharmacist for more information. What side effects may I notice from receiving this medicine? Side effects that you should report to your doctor or health care professional as soon as possible:  allergic reactions like skin rash, itching or hives, swelling of the face, lips, or tongue  bone pain  breathing problems  dizziness  jaw pain, especially after dental work  redness, blistering, peeling of the skin  signs and symptoms of infection like fever or chills; cough; sore throat; pain or trouble passing urine  signs of low calcium like fast heartbeat, muscle cramps or muscle pain; pain, tingling, numbness in the hands or feet; seizures  unusual bleeding or bruising  unusually weak or tired Side effects that usually do not require medical attention (report to your  doctor or health care professional if they continue or are bothersome):  constipation  diarrhea  headache  joint pain  loss of appetite  muscle pain  runny nose  tiredness  upset stomach This list may not describe all possible side effects. Call your doctor for medical advice about side effects. You may report side effects to FDA at 1-800-FDA-1088. Where should I keep my medicine? This medicine is only given in a clinic, doctor's office, or other health care setting and will not be stored at home. NOTE: This sheet is a summary. It may not cover all possible information. If you have questions about this medicine, talk to your doctor, pharmacist, or health care provider.  2020 Elsevier/Gold Standard (2018-04-13 16:10:44)  Pegfilgrastim injection (Onpro Device) What is this medicine? PEGFILGRASTIM (PEG fil gra stim) is a long-acting granulocyte colony-stimulating factor that stimulates the growth of neutrophils, a type of white blood cell important in the body's fight against infection. It is used to reduce the incidence of fever and infection in patients with certain types of cancer who are receiving chemotherapy that affects the bone marrow, and to increase survival after being exposed to high doses of radiation. This medicine may be used for other purposes; ask your health care provider or pharmacist  if you have questions. COMMON BRAND NAME(S): Steve Rattler, Ziextenzo What should I tell my health care provider before I take this medicine? They need to know if you have any of these conditions:  kidney disease  latex allergy  ongoing radiation therapy  sickle cell disease  skin reactions to acrylic adhesives (On-Body Injector only)  an unusual or allergic reaction to pegfilgrastim, filgrastim, other medicines, foods, dyes, or preservatives  pregnant or trying to get pregnant  breast-feeding How should I use this medicine? This medicine is for injection  under the skin. If you get this medicine at home, you will be taught how to prepare and give the pre-filled syringe or how to use the On-body Injector. Refer to the patient Instructions for Use for detailed instructions. Use exactly as directed. Tell your healthcare provider immediately if you suspect that the On-body Injector may not have performed as intended or if you suspect the use of the On-body Injector resulted in a missed or partial dose. It is important that you put your used needles and syringes in a special sharps container. Do not put them in a trash can. If you do not have a sharps container, call your pharmacist or healthcare provider to get one. Talk to your pediatrician regarding the use of this medicine in children. While this drug may be prescribed for selected conditions, precautions do apply. Overdosage: If you think you have taken too much of this medicine contact a poison control center or emergency room at once. NOTE: This medicine is only for you. Do not share this medicine with others. What if I miss a dose? It is important not to miss your dose. Call your doctor or health care professional if you miss your dose. If you miss a dose due to an On-body Injector failure or leakage, a new dose should be administered as soon as possible using a single prefilled syringe for manual use. What may interact with this medicine? Interactions have not been studied. Give your health care provider a list of all the medicines, herbs, non-prescription drugs, or dietary supplements you use. Also tell them if you smoke, drink alcohol, or use illegal drugs. Some items may interact with your medicine. This list may not describe all possible interactions. Give your health care provider a list of all the medicines, herbs, non-prescription drugs, or dietary supplements you use. Also tell them if you smoke, drink alcohol, or use illegal drugs. Some items may interact with your medicine. What should I  watch for while using this medicine? You may need blood work done while you are taking this medicine. If you are going to need a MRI, CT scan, or other procedure, tell your doctor that you are using this medicine (On-Body Injector only). What side effects may I notice from receiving this medicine? Side effects that you should report to your doctor or health care professional as soon as possible:  allergic reactions like skin rash, itching or hives, swelling of the face, lips, or tongue  back pain  dizziness  fever  pain, redness, or irritation at site where injected  pinpoint red spots on the skin  red or dark-brown urine  shortness of breath or breathing problems  stomach or side pain, or pain at the shoulder  swelling  tiredness  trouble passing urine or change in the amount of urine Side effects that usually do not require medical attention (report to your doctor or health care professional if they continue or are bothersome):  bone  pain  muscle pain This list may not describe all possible side effects. Call your doctor for medical advice about side effects. You may report side effects to FDA at 1-800-FDA-1088. Where should I keep my medicine? Keep out of the reach of children. If you are using this medicine at home, you will be instructed on how to store it. Throw away any unused medicine after the expiration date on the label. NOTE: This sheet is a summary. It may not cover all possible information. If you have questions about this medicine, talk to your doctor, pharmacist, or health care provider.  2020 Elsevier/Gold Standard (2018-03-12 16:57:08)  Coronavirus (COVID-19) Are you at risk?  Are you at risk for the Coronavirus (COVID-19)?  To be considered HIGH RISK for Coronavirus (COVID-19), you have to meet the following criteria:  . Traveled to [place], [place], [place], [place] or [place]; or in the [place] to [place], [place], [place], or [place];  and have fever, cough, and shortness of breath within the last 2 weeks of travel OR . Been in close contact with a person diagnosed with COVID-19 within the last 2 weeks and have fever, cough, and shortness of breath . IF YOU DO NOT MEET THESE CRITERIA, YOU ARE CONSIDERED LOW RISK FOR COVID-19.  What to do if you are HIGH RISK for COVID-19?  Marland Kitchen If you are having a medical emergency, call 911. . Seek medical care right away. Before you go to a doctor's office, urgent care or emergency department, call ahead and tell them about your recent travel, contact with someone diagnosed with COVID-19, and your symptoms. You should receive instructions from your physician's office regarding next steps of care.  . When you arrive at healthcare provider, tell the healthcare staff immediately you have returned from visiting [place], [place], [place], [place] or [place]; or traveled in the [place] to [place], [place], [place], or [place]; in the last two weeks or you have been in close contact with a person diagnosed with COVID-19 in the last 2 weeks.   . Tell the health care staff about your symptoms: fever, cough and shortness of breath. . After you have been seen by a medical provider, you will be either: o Tested for (COVID-19) and discharged home on quarantine except to seek medical care if symptoms worsen, and asked to  - Stay home and avoid contact with others until you get your results (4-5 days)  - Avoid travel on public transportation if possible (such as bus, train, or airplane) or o Sent to the Emergency Department by EMS for evaluation, COVID-19 testing, and possible admission depending on your condition and test results.  What to do if you are LOW RISK for COVID-19?  Reduce your risk of any infection by using the same precautions used for avoiding the common cold or flu:  Marland Kitchen Wash your hands often with soap and warm water for at least 20 seconds.  If soap and water are not readily  available, use an alcohol-based hand sanitizer with at least 60% alcohol.  . If coughing or sneezing, cover your mouth and nose by coughing or sneezing into the elbow areas of your shirt or coat, into a tissue or into your sleeve (not your hands). . Avoid shaking hands with others and consider head nods or verbal greetings only. . Avoid touching your eyes, nose, or mouth with unwashed hands.  . Avoid close contact with people who are sick. . Avoid places or  events with large numbers of people in one location, like concerts or sporting events. . Carefully consider travel plans you have or are making. . If you are planning any travel outside or inside the [place], visit the CDC's Travelers' Health webpage for the latest health notices. . If you have some symptoms but not all symptoms, continue to monitor at home and seek medical attention if your symptoms worsen. . If you are having a medical emergency, call 911.   [facility] / e-Visit: [email or web address]         [facility]: [facility]: [phone]                   [facility]: [phone]

## 2019-08-23 NOTE — Progress Notes (Signed)
Hematology and Oncology Follow Up Visit  [name] [id number] 1941-03-23 77 y.o. 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  He initially presented in 2017 with Gleason score of 5+4 = 9 and a PSA of 13.56.   Prior Therapy: He was treated there with external beam radiation and brachii therapy in January 2017 and completed 2 years of androgen deprivation.  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  Metastatic work-up at that time showed bone involvement as well as lymphadenopathy.    He was treated under the care of Dr. [name] at [facility] initially with enzalutamide started in May 2018 until January 2019 where he presented with progression of disease with bony metastasis, adrenal nodule as well as worsening adenopathy.    Taxotere chemotherapy initially at 60 mg/m and that was reduced to 45 mg/m after cycle 6.  His last treatment was in July 2019 given at [facility].  Repeat imaging studies showed his lymphadenopathy has improved but he did develop worsening bony metastasis based on a lumbar spine obtained on June 24, 2018.  The MRI showed a complete replacement of L3 vertebral body with tumor the tumor in the ventral epidural space behind L3 greater to the left.  Bulky retroperitoneal adenopathy was slightly improved as mentioned.    He is status post decompressive laminectomy and removal of L3 tumor completed on 08/06/2018.  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   Zytiga 1000 mg daily started in September 2019.  Therapy discontinued in May 2020 for progression of disease.  He is status post stereotactic radiosurgery to the L4 vertebral body  completed on May 10, 2019.  Current therapy:  [name] chemotherapy 20 mg per metered square cycle 1 given on May 10, 2019.  He status post 5 cycles of therapy.  Androgen deprivation under the care of Dr. [name].   Xgeva 120 mg every 6 weeks.  Last injection was given on June 20, 2019.  Interim History: [name] is here for a repeat follow-up.  Since the last visit, he reports no major changes in his health.  He continues to have hoarseness but without any sore throat or cough.  He is using throat lozenges which helps at nighttime.  He denies any nausea, vomiting but does report more fatigue and weakness as well as weight loss.  His pain is manageable at this time with oxycodone at nighttime.  His mobility is limited predominantly with a walker and a cane at home.  Denies any recent falls or syncope.  He denied any alteration mental status, neuropathy, confusion or dizziness.  Denies any headaches or lethargy.  Denies any night sweats, weight loss or changes in appetite.  Denied orthopnea, dyspnea on exertion or chest discomfort.  Denies shortness of breath, difficulty breathing hemoptysis or cough.  Denies any abdominal distention, nausea, early satiety or dyspepsia.  Denies any hematuria, frequency, dysuria or nocturia.  Denies any skin irritation, dryness or rash.  Denies any ecchymosis or petechiae.  Denies any lymphadenopathy or clotting.  Denies any heat or cold intolerance.  Denies any anxiety or depression.  Remaining review of system is negative.  Medications: Without any changes on review Current Outpatient Medications  Medication Sig Dispense Refill  . acetaminophen (TYLENOL) 500 MG tablet Take 500 mg by mouth daily. Pt takes 5 days after chemo    . Calcium Carb-Cholecalciferol (CALCIUM 1000 + D PO) Take 30 mLs by mouth daily. Calcium 1000 mg / Vit D 1000 iu    . cycloSPORINE (RESTASIS) 0.05 % ophthalmic emulsion Place 1 drop into both eyes 2  (two) times daily.    Marland Kitchen docusate sodium (COLACE) 100 MG capsule Take 100 mg by mouth 2 (two) times daily.    Marland Kitchen enoxaparin (LOVENOX) 80 MG/0.8ML injection Inject 0.8 mLs (80 mg total) into the skin 2 (two) times daily for 30 days. 60 Syringe 1  . ezetimibe (ZETIA) 10 MG tablet Take 10 mg by mouth every morning.     Marland Kitchen ibuprofen (ADVIL,MOTRIN) 200 MG tablet Take 200 mg by mouth every 6 (six) hours as needed for moderate pain.    Marland Kitchen leuprolide (LUPRON) 30 MG injection Inject 45 mg into the muscle every 6 (six) months.    . lidocaine-prilocaine (EMLA) cream Apply 1 application topically as needed. 30 g 0  . Loratadine (CLARITIN) 10 MG CAPS Take 10 mg by mouth daily. Patient takes every day for 5 days after chemo treatment.    . ondansetron (ZOFRAN) 4 MG tablet Take 4 mg by mouth every 8 (eight) hours as needed for nausea or vomiting.    Marland Kitchen oxyCODONE (OXY IR/ROXICODONE) 5 MG immediate release tablet Take 1-2 tablets (5-10 mg total) by mouth every 4 (four) hours as needed for severe pain. 90 tablet 0  . pantoprazole (PROTONIX) 40 MG tablet Take 40 mg by mouth daily after supper.     . polyethylene glycol (MIRALAX / GLYCOLAX) packet Take 17 g by mouth daily as needed for mild constipation. 14 each 0  . predniSONE (DELTASONE) 5 MG tablet TAKE 1 TABLET BY MOUTH 2 TIMES DAILY WITH A MEAL. 60 tablet 2  . prochlorperazine (COMPAZINE) 10 MG tablet Take 1 tablet (10 mg total) by mouth every 6 (six) hours as needed for nausea or vomiting. (Patient taking differently: Take 5 mg by mouth 2 (two) times a day. ) 30 tablet 0  . tamsulosin (FLOMAX) 0.4 MG CAPS capsule Take 0.4 mg by mouth daily.     . traMADol (ULTRAM) 50 MG tablet TAKE 1 TABLET BY MOUTH EVERY 6 HOURS AS NEEDED FOR PAIN 60 tablet 0  . triamcinolone cream (KENALOG) 0.1 % Apply 1 application topically daily as needed (dry skin in the winter).     Alveda Reasons 20 MG TABS tablet TAKE 1 TABLET (20 MG TOTAL) BY MOUTH DAILY WITH SUPPER FOR 30 DAYS. 90 tablet 1  .  ZYTIGA 250 MG tablet TAKE 4 TABLETS (1,000 MG TOTAL) BY MOUTH DAILY. TAKE ON AN EMPTY STOMACH 1 HOUR BEFORE OR 2 HOURS AFTER A MEAL (Patient not taking: Reported on 05/18/2019) 120 tablet 0   No current facility-administered medications for this visit.      Allergies:  Allergies  Allergen Reactions  . Statins Other (See Comments)    Muscle pain    Past Medical History, Surgical history, Social history, and Family History reviewed without changes    Physical Exam:  Blood pressure (!) 152/88, pulse (!) 102, temperature 98.2 F (36.8 C), temperature source Temporal, resp. rate 18, weight 177 lb (80.3 kg), SpO2 98 %.   ECOG: 1    General appearance: Alert, awake without any distress. Head: Atraumatic  without abnormalities Oropharynx: Without any thrush or ulcers. Eyes: No scleral icterus. Lymph nodes: No lymphadenopathy noted in the cervical, supraclavicular, or axillary nodes Heart:regular rate and rhythm, without any murmurs or gallops.   Lung: Clear to auscultation without any rhonchi, wheezes or dullness to percussion. Abdomin: Soft, nontender without any shifting dullness or ascites. Musculoskeletal: No clubbing or cyanosis. Neurological: No motor or sensory deficits. Skin: No rashes or lesions.         Results for [name], [name] (MRN [id number]) as of 08/23/2019 07:40  Ref. Range 06/20/2019 08:52 07/11/2019 08:07 08/01/2019 08:49  Prostate Specific Ag, Serum Latest Ref Range: 0.0 - 4.0 ng/mL 90.8 (H) 103.0 (H) 113.0 (H)       Lab Results: Lab Results  Component Value Date   WBC 9.1 08/01/2019   HGB 9.2 (L) 08/01/2019   HCT 28.7 (L) 08/01/2019   MCV 95.7 08/01/2019   PLT 323 08/01/2019     Chemistry      Component Value Date/Time   NA 135 08/01/2019 0849   K 3.5 08/01/2019 0849   CL 105 08/01/2019 0849   CO2 20 (L) 08/01/2019 0849   BUN 21 08/01/2019 0849   CREATININE 0.80 08/01/2019 0849      Component Value Date/Time   CALCIUM 8.8 (L) 08/01/2019  0849   ALKPHOS 72 08/01/2019 0849   AST 19 08/01/2019 0849   ALT 10 08/01/2019 0849   BILITOT 0.4 08/01/2019 0849      IMPRESSION: 1. Interval progression of disease. Increase in size of left supraclavicular, mediastinal and right hilar nodal metastasis. 2. 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 This is a new finding when compared with 07/23/2018. Compared with 04/23/2019 the left supraclavicular nodal mass has increased in size with progressive narrowing of the vessel. 3. Interval progression of multifocal liver metastasis. 4. Increase in nodal metastasis within the abdomen and pelvis. 5. Similar appearance of sclerotic metastasis involving the left ischial ramus. 6. Stable appearance of L3 and L5 compression fractures.     78 year old man with:   1.  Prostate cancer with advanced disease and noted to the liver and lymphadenopathy that is currently castration-resistant.   He is status post 5 cycles of chemotherapy with modest PSA response initially but his PSA currently rising.  CT scan obtained on 08/20/2019 was personally reviewed and discussed with the patient which showed progression of disease including his hepatic metastasis as well as diffuse lymphadenopathy.  The differential diagnosis of these findings include prostate adenocarcinoma that is refractory to [name] versus a poorly differentiated tumor with small cell features that could be resistant altogether to single agent taxanes.  My recommendation is to obtain tissue biopsy before proceeding with any further chemotherapy.  If we are dealing with a poorly differentiated tumor platinum based chemotherapy would be the next option to attempt to palliate his disease.  After discussion today he is agreeable to proceed.  He received [name] today but potentially chemotherapy will be changed based on the results of the biopsy.    2.  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  3.  Bone directed therapy: He continues to be on Xgeva without any complications.  Osteonecrosis of the jaw and hypocalcemia are potential complications.   4.  Androgen deprivation: No issues reported related to that and I recommended continuing this indefinitely.  He is currently receiving it under the care of Dr. [name].  5.  Pain: Predominantly in the  back related to his spinal metastasis.  Manageable at this time with oxycodone which will be refilled for him.  6.  Antiemetics: No Majors nausea or vomiting reported at this time.  7.  IV access: Port-A-Cath currently in use without any issues.  8.  Growth factor support: He is at risk of developing neutropenia and sepsis.  He will need growth factor support after each cycle of therapy.  9.  Venous thromboembolism: No recurrent thrombosis noted.  He continues to be on full dose anticoagulation.  10.  Prognosis and goals of care: Therapy remains palliative at this time aggressive measures are still warranted and desired by patient.  He is experiencing some decline in his performance status and we might be approaching a point where no additional chemotherapy can be given.  If he reaches that point, hospice will be his next option.  11.  Anorexia: Prescription for Megace will be available to him at this time.   12.  Follow-up: We will be in the near future to restart different salvage therapy pending the results of biopsy.  25  minutes was spent with the patient face-to-face today.  More than 50% of time was spent on reviewing his disease status, discussing treatment options, reviewing imaging studies as well as the natural course of his disease.    Zola Button, MD 9/4/20208:00 AM

## 2019-08-24 LAB — PROSTATE-SPECIFIC AG, SERUM (LABCORP): Prostate Specific Ag, Serum: 153 ng/mL — ABNORMAL HIGH (ref 0.0–4.0)

## 2019-08-27 ENCOUNTER — Telehealth: Payer: Self-pay | Admitting: Oncology

## 2019-08-27 ENCOUNTER — Other Ambulatory Visit: Payer: Self-pay

## 2019-08-27 ENCOUNTER — Ambulatory Visit
Admission: RE | Admit: 2019-08-27 | Discharge: 2019-08-27 | Disposition: A | Discharge status: Home / Self Care | Payer: Medicare Other | Source: Ambulatory Visit | Attending: Neurosurgery | Admitting: Neurosurgery

## 2019-08-27 DIAGNOSIS — M5126 Other intervertebral disc displacement, lumbar region: Secondary | ICD-10-CM | POA: Diagnosis not present

## 2019-08-27 DIAGNOSIS — M47816 Spondylosis without myelopathy or radiculopathy, lumbar region: Secondary | ICD-10-CM | POA: Diagnosis not present

## 2019-08-27 DIAGNOSIS — D492 Neoplasm of unspecified behavior of bone, soft tissue, and skin: Secondary | ICD-10-CM | POA: Diagnosis not present

## 2019-08-27 DIAGNOSIS — C7952 Secondary malignant neoplasm of bone marrow: Secondary | ICD-10-CM

## 2019-08-27 DIAGNOSIS — C61 Malignant neoplasm of prostate: Secondary | ICD-10-CM | POA: Diagnosis not present

## 2019-08-27 DIAGNOSIS — M5136 Other intervertebral disc degeneration, lumbar region: Secondary | ICD-10-CM | POA: Diagnosis not present

## 2019-08-27 DIAGNOSIS — C7951 Secondary malignant neoplasm of bone: Secondary | ICD-10-CM | POA: Diagnosis not present

## 2019-08-27 DIAGNOSIS — M4856XA Collapsed vertebra, not elsewhere classified, lumbar region, initial encounter for fracture: Secondary | ICD-10-CM | POA: Diagnosis not present

## 2019-08-27 MED ORDER — GADOBENATE DIMEGLUMINE 529 MG/ML IV SOLN
17.0000 mL | Freq: Once | INTRAVENOUS | Status: AC | PRN
  Administered 2019-08-27: 17 mL via INTRAVENOUS

## 2019-08-27 NOTE — Telephone Encounter (Signed)
Called and spoke with patient. Confirmed 9/23 - 9/24 appts

## 2019-08-28 ENCOUNTER — Other Ambulatory Visit: Payer: Self-pay | Admitting: Radiology

## 2019-08-29 ENCOUNTER — Other Ambulatory Visit: Payer: Self-pay

## 2019-08-29 ENCOUNTER — Ambulatory Visit (HOSPITAL_COMMUNITY)
Admission: RE | Admit: 2019-08-29 | Discharge: 2019-08-29 | Disposition: A | Discharge status: Home / Self Care | Payer: Medicare Other | Source: Ambulatory Visit | Attending: Oncology | Admitting: Oncology

## 2019-08-29 DIAGNOSIS — K2270 Barrett's esophagus without dysplasia: Secondary | ICD-10-CM | POA: Diagnosis not present

## 2019-08-29 DIAGNOSIS — C61 Malignant neoplasm of prostate: Secondary | ICD-10-CM | POA: Diagnosis not present

## 2019-08-29 DIAGNOSIS — H269 Unspecified cataract: Secondary | ICD-10-CM | POA: Diagnosis not present

## 2019-08-29 DIAGNOSIS — C787 Secondary malignant neoplasm of liver and intrahepatic bile duct: Secondary | ICD-10-CM | POA: Diagnosis not present

## 2019-08-29 DIAGNOSIS — M2550 Pain in unspecified joint: Secondary | ICD-10-CM | POA: Insufficient documentation

## 2019-08-29 DIAGNOSIS — M1990 Unspecified osteoarthritis, unspecified site: Secondary | ICD-10-CM | POA: Insufficient documentation

## 2019-08-29 DIAGNOSIS — E785 Hyperlipidemia, unspecified: Secondary | ICD-10-CM | POA: Diagnosis not present

## 2019-08-29 DIAGNOSIS — R160 Hepatomegaly, not elsewhere classified: Secondary | ICD-10-CM | POA: Diagnosis not present

## 2019-08-29 DIAGNOSIS — K219 Gastro-esophageal reflux disease without esophagitis: Secondary | ICD-10-CM | POA: Diagnosis not present

## 2019-08-29 DIAGNOSIS — Z79899 Other long term (current) drug therapy: Secondary | ICD-10-CM | POA: Diagnosis not present

## 2019-08-29 DIAGNOSIS — Z8546 Personal history of malignant neoplasm of prostate: Secondary | ICD-10-CM | POA: Diagnosis not present

## 2019-08-29 LAB — CBC
HCT: 30.5 % — ABNORMAL LOW (ref 39.0–52.0)
Hemoglobin: 9.4 g/dL — ABNORMAL LOW (ref 13.0–17.0)
MCH: 29.9 pg (ref 26.0–34.0)
MCHC: 30.8 g/dL (ref 30.0–36.0)
MCV: 97.1 fL (ref 80.0–100.0)
Platelets: 334 10*3/uL (ref 150–400)
RBC: 3.14 MIL/uL — ABNORMAL LOW (ref 4.22–5.81)
RDW: 18 % — ABNORMAL HIGH (ref 11.5–15.5)
WBC: 6.1 10*3/uL (ref 4.0–10.5)
nRBC: 0 % (ref 0.0–0.2)

## 2019-08-29 LAB — PROTIME-INR
INR: 1.1 (ref 0.8–1.2)
Prothrombin Time: 14.3 seconds (ref 11.4–15.2)

## 2019-08-29 MED ORDER — LIDOCAINE HCL (PF) 1 % IJ SOLN
INTRAMUSCULAR | Status: AC
  Filled 2019-08-29: qty 30

## 2019-08-29 MED ORDER — FENTANYL CITRATE (PF) 100 MCG/2ML IJ SOLN
INTRAMUSCULAR | Status: AC | PRN
  Administered 2019-08-29: 50 ug via INTRAVENOUS

## 2019-08-29 MED ORDER — MIDAZOLAM HCL 2 MG/2ML IJ SOLN
INTRAMUSCULAR | Status: AC | PRN
  Administered 2019-08-29: 1 mg via INTRAVENOUS

## 2019-08-29 MED ORDER — FENTANYL CITRATE (PF) 100 MCG/2ML IJ SOLN
INTRAMUSCULAR | Status: AC
  Filled 2019-08-29: qty 2

## 2019-08-29 MED ORDER — MIDAZOLAM HCL 2 MG/2ML IJ SOLN
INTRAMUSCULAR | Status: AC
  Filled 2019-08-29: qty 2

## 2019-08-29 MED ORDER — GELATIN ABSORBABLE 12-7 MM EX MISC
CUTANEOUS | Status: AC
  Filled 2019-08-29: qty 1

## 2019-08-29 MED ORDER — SODIUM CHLORIDE 0.9 % IV SOLN: INTRAVENOUS | Status: DC

## 2019-08-29 NOTE — Discharge Instructions (Signed)
Moderate Conscious Sedation, Adult, Care After These instructions provide you with information about caring for yourself after your procedure. Your health care provider may also give you more specific instructions. Your treatment has been planned according to current medical practices, but problems sometimes occur. Call your health care provider if you have any problems or questions after your procedure. What can I expect after the procedure? After your procedure, it is common:  To feel sleepy for several hours.  To feel clumsy and have poor balance for several hours.  To have poor judgment for several hours.  To vomit if you eat too soon. Follow these instructions at home: For at least 24 hours after the procedure:   Do not: ? Participate in activities where you could fall or become injured. ? Drive. ? Use heavy machinery. ? Drink alcohol. ? Take sleeping pills or medicines that cause drowsiness. ? Make important decisions or sign legal documents. ? Take care of children on your own.  Rest. Eating and drinking  Follow the diet recommended by your health care provider.  If you vomit: ? Drink water, juice, or soup when you can drink without vomiting. ? Make sure you have little or no nausea before eating solid foods. General instructions  Have a responsible adult stay with you until you are awake and alert.  Take over-the-counter and prescription medicines only as told by your health care provider.  If you smoke, do not smoke without supervision.  Keep all follow-up visits as told by your health care provider. This is important. Contact a health care provider if:  You keep feeling nauseous or you keep vomiting.  You feel light-headed.  You develop a rash.  You have a fever. Get help right away if:  You have trouble breathing. This information is not intended to replace advice given to you by your health care provider. Make sure you discuss any questions you have  with your health care provider. Document Released: 09/25/2013 Document Revised: 11/17/2017 Document Reviewed: 03/26/2016 Elsevier Patient Education  2020 [employer].   Liver Biopsy, Care After These instructions give you information on caring for yourself after your procedure. Your doctor may also give you more specific instructions. Call your doctor if you have any problems or questions after your procedure. What can I expect after the procedure? After the procedure, it is common to have:  Pain and soreness where the biopsy was done.  Bruising around the area where the biopsy was done.  Sleepiness and be tired for a few days. Follow these instructions at home: Medicines  Take over-the-counter and prescription medicines only as told by your doctor.  If you were prescribed an antibiotic medicine, take it as told by your doctor. Do not stop taking the antibiotic even if you start to feel better.  Do not take medicines such as aspirin and ibuprofen. These medicines can thin your blood. Do not take these medicines unless your doctor tells you to take them.  If you are taking prescription pain medicine, take actions to prevent or treat constipation. Your doctor may recommend that you: ? Drink enough fluid to keep your pee (urine) clear or pale yellow. ? Take over-the-counter or prescription medicines. ? Eat foods that are high in fiber, such as fresh fruits and vegetables, whole grains, and beans. ? Limit foods that are high in fat and processed sugars, such as fried and sweet foods. Caring for your cut  Follow instructions from your doctor about how to take care of  from surgery (incisions). Make sure you: °? Wash your hands with soap and water before you change your bandage (dressing). If you cannot use soap and water, use hand sanitizer. °? Change your bandage as told by your doctor. °? Leave stitches (sutures), skin glue, or skin tape (adhesive) strips in place. They may need to  stay in place for 2 weeks or longer. If tape strips get loose and curl up, you may trim the loose edges. Do not remove tape strips completely unless your doctor says it is okay. °· Check your cuts every day for signs of infection. Check for: °? Redness, swelling, or more pain. °? Fluid or blood. °? Pus or a bad smell. °? Warmth. °· Do not take baths, swim, or use a hot tub until your doctor says it is okay to do so. °Activity ° °· Rest at home for 1-2 days or as told by your doctor. °? Avoid sitting for a long time without moving. Get up to take short walks every 1-2 hours. °· Return to your normal activities as told by your doctor. Ask what activities are safe for you. °· Do not do these things in the first 24 hours: °? Drive. °? Use machinery. °? Take a bath or shower. °· Do not lift more than 10 pounds (4.5 kg) or play contact sports for the first 2 weeks. °General instructions ° °· Do not drink alcohol in the first week after the procedure. °· Have someone stay with you for at least 24 hours after the procedure. °· Get your test results. Ask your doctor or the department that is doing the test: °? When will my results be ready? °? How will I get my results? °? What are my treatment options? °? What other tests do I need? °? What are my next steps? °· Keep all follow-up visits as told by your doctor. This is important. °Contact a doctor if: °· A cut bleeds and leaves more than just a small spot of blood. °· A cut is red, puffs up (swells), or hurts more than before. °· Fluid or something else comes from a cut. °· A cut smells bad. °· You have a fever or chills. °Get help right away if: °· You have swelling, bloating, or pain in your belly (abdomen). °· You get dizzy or faint. °· You have a rash. °· You feel sick to your stomach (nauseous) or throw up (vomit). °· You have trouble breathing, feel short of breath, or feel faint. °· Your chest hurts. °· You have problems talking or seeing. °· You have trouble with  your balance or moving your arms or legs. °Summary °· After the procedure, it is common to have pain, soreness, bruising, and tiredness. °· Your doctor will tell you how to take care of yourself at home. Change your bandage, take your medicines, and limit your activities as told by your doctor. °· Call your doctor if you have symptoms of infection. Get help right away if your belly swells, your cut bleeds a lot, or you have trouble talking or breathing. °This information is not intended to replace advice given to you by your health care provider. Make sure you discuss any questions you have with your health care provider. °Document Released: 09/13/2008 Document Revised: 12/15/2017 Document Reviewed: 12/15/2017 °Elsevier Patient Education © 2020 [employer] ° °

## 2019-08-29 NOTE — H&P (Signed)
Referring Physician(s): [name]  Supervising Physician: Corrie Mckusick  Patient Status:  [facility] OP  Chief Complaint:  "I'm having a biopsy"  Subjective: Patient familiar to IR service from prior Port-A-Cath placement on 05/01/2019.  He has a history of  metastatic prostate cancer with prior radiation/currently on chemotherapy and follow-up imaging revealing interval progression of disease with adenopathy and multifocal liver metastases.  He is scheduled today for image guided liver lesion biopsy for further evaluation. He currently denies fever,HA,CP, abd pain,N/V. He does have dyspnea with exertion, cough , back pain and right lower extremity swelling. 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    Past Medical History:  Diagnosis Date   Arthritis    Barrett's esophagus    Bone cancer ([place])    Cataract    GERD (gastroesophageal reflux disease)    takes Protonix daily   History of colon polyps    Hyperlipidemia    takes Zetia and Welchol daily   Joint pain    Muscle spasm    takes Robaxin daily as needed   Prostate cancer ([place]) dx'd 10/27/2014   multiple recurrences with bone mets 07/13/15 and 06/24/18   Past Surgical History:  Procedure Laterality Date   COLONOSCOPY     ESOPHAGOGASTRODUODENOSCOPY     IR IMAGING GUIDED PORT INSERTION  05/01/2019   KNEE ARTHROSCOPY Right 2011   LUMBAR LAMINECTOMY/DECOMPRESSION MICRODISCECTOMY Left 08/06/2018   Procedure: Laminectomy and Foraminotomy - Lumbar Three-Four- left;  Surgeon: Kary Kos, MD;  Location: [place];  Service: Neurosurgery;  Laterality: Left;  left   TOTAL HIP ARTHROPLASTY Left 05/07/2013   Procedure: LEFT TOTAL HIP ARTHROPLASTY ANTERIOR APPROACH;  Surgeon: Mcarthur Rossetti, MD;  Location: [place];  Service: Orthopedics;  Laterality: Left;   TOTAL HIP ARTHROPLASTY Right    TOTAL HIP REVISION Right 02/02/2015   Procedure: TOTAL HIP REVISION;  Surgeon: Kerin Salen, MD;  Location: [place];   Service: Orthopedics;  Laterality: Right;      Allergies: Statins  Medications: Prior to Admission medications   Medication Sig Start Date End Date Taking? Authorizing Provider  acetaminophen (TYLENOL) 500 MG tablet Take 500 mg by mouth daily. Pt takes 5 days after chemo   Yes [provider]  antiseptic oral rinse (BIOTENE) LIQD 15 mLs by Mouth Rinse route as needed for dry mouth.   Yes [provider]  Calcium Carb-Cholecalciferol (CALCIUM 1000 + D PO) Take 30 mLs by mouth 3 (three) times a week. Calcium 1000 mg / Vit D 1000 iu    Yes [provider]  cycloSPORINE (RESTASIS) 0.05 % ophthalmic emulsion Place 1 drop into both eyes 2 (two) times daily.   Yes [provider]  docusate sodium (COLACE) 100 MG capsule Take 100 mg by mouth 2 (two) times daily.   Yes [provider]  ezetimibe (ZETIA) 10 MG tablet Take 10 mg by mouth every morning.    Yes [provider]  ibuprofen (ADVIL,MOTRIN) 200 MG tablet Take 200 mg by mouth every 6 (six) hours as needed for moderate pain.   Yes [provider]  lidocaine-prilocaine (EMLA) cream Apply 1 application topically as needed. 04/25/19  Yes [name], MD  Loratadine (CLARITIN) 10 MG CAPS Take 10 mg by mouth daily. Patient takes every day for 5 days after chemo treatment.   Yes [provider]  megestrol (MEGACE) 400 MG/10ML suspension Take 10 mLs (400 mg total) by mouth 2 (two)  times daily. 08/23/19  Yes [name], MD  oxyCODONE (OXY IR/ROXICODONE) 5 MG immediate release tablet Take 1-2 tablets (5-10 mg total) by mouth every 4 (four) hours as needed for severe pain. 08/23/19  Yes [name], MD  pantoprazole (PROTONIX) 40 MG tablet Take 40 mg by mouth daily after supper.    Yes [provider]  polyethylene glycol (MIRALAX / GLYCOLAX) packet Take 17 g by mouth daily as needed for mild constipation. 08/17/18  Yes Vann, Jessica U, DO  predniSONE (DELTASONE) 5 MG  tablet TAKE 1 TABLET BY MOUTH 2 TIMES DAILY WITH A MEAL. Patient taking differently: Take 5 mg by mouth 2 (two) times daily with a meal.  07/15/19  Yes Shadad, Mathis Dad, MD  tamsulosin (FLOMAX) 0.4 MG CAPS capsule Take 0.4 mg by mouth daily.  08/10/16  Yes [provider]  traMADol (ULTRAM) 50 MG tablet TAKE 1 TABLET BY MOUTH EVERY 6 HOURS AS NEEDED FOR PAIN 05/07/19  Yes Shadad, Mathis Dad, MD  XARELTO 20 MG TABS tablet TAKE 1 TABLET (20 MG TOTAL) BY MOUTH DAILY WITH SUPPER FOR 30 DAYS. Patient taking differently: Take 20 mg by mouth daily with supper.  08/19/19  Yes [name], MD  leuprolide (LUPRON) 30 MG injection Inject 45 mg into the muscle every 6 (six) months.    [provider]  ondansetron (ZOFRAN) 4 MG tablet Take 4 mg by mouth every 8 (eight) hours as needed for nausea or vomiting.    [provider]  prochlorperazine (COMPAZINE) 10 MG tablet Take 1 tablet (10 mg total) by mouth every 6 (six) hours as needed for nausea or vomiting. 04/25/19   [name], MD  triamcinolone cream (KENALOG) 0.1 % Apply 1 application topically daily as needed (dry skin in the winter).     [provider]     Vital Signs: BP 128/80    Pulse (!) 111    Temp 98 F (36.7 C) (Skin)    Resp 14    Ht 5\' 11"  (1.803 m)    Wt 185 lb (83.9 kg)    SpO2 100%    BMI 25.80 kg/m   Physical Exam  Imaging: Mr Lumbar Spine W Wo Contrast  Result Date: 08/27/2019 CLINICAL DATA:  Low back pain and bilateral leg pain and weakness for 1 month. Metastatic prostate cancer to bone. EXAM: MRI LUMBAR SPINE WITHOUT AND WITH CONTRAST TECHNIQUE: Multiplanar and multiecho pulse sequences of the lumbar spine were obtained without and with intravenous contrast. CONTRAST:  95mL MULTIHANCE GADOBENATE DIMEGLUMINE 529 MG/ML IV SOLN COMPARISON:  MRI dated 04/26/2019 and CT scan of the abdomen and pelvis dated 08/20/2019 FINDINGS: Segmentation:  Standard. Alignment: Straightening of the normal lumbar  lordosis. Retropulsion of the posterior margin of L3 into the spinal canal due to the pathologic fracture, unchanged. Vertebrae: Progressive metastatic lesions throughout the lumbar spine. Conus medullaris and cauda equina: Conus extends to the L1-2 level. Conus and cauda equina appear normal. Paraspinal and other soft tissues: Progressive metastatic disease involving the left adrenal gland, periaortic lymph nodes, bilateral iliac chain lymph nodes, with progressive extension into the psoas muscles bilaterally. Disc levels: T12-L1: The disc is normal. Progression of the metastatic disease in the T12 vertebral body without extension into the spinal canal. No neural impingement. L1-2: Disc desiccation with a small broad-based disc bulge without neural impingement. Progression of metastatic disease in the L1 vertebral body without extension into the spinal canal. L2-3: Stable old pathologic fracture  of L1 with protrusion of the posterior margin of L1 into the spinal canal. Previous left decompressive laminectomy. No new impingement upon the spinal canal. Chronic hypertrophy of the right facet joint compresses the right lateral recess, unchanged. L3-4: Small broad-based disc bulge with chronic hypertrophy of the ligamentum flavum and facet joints creating mild narrowing of the spinal canal as well as persistent right foraminal stenosis. Progression of metastatic disease in the L4 vertebral body. L4-5: Progressive compression fracture of the L4 vertebral body due to metastatic disease. Small broad-based disc bulge without change. Bilateral foraminal stenosis, unchanged. L5-S1: Normal disc. Progressive tumor in the L5 vertebra with increased pathologic compression of the vertebral body. No neural impingement. IMPRESSION: 1. 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 2. No new neural impingement. No severe spinal stenosis. No appreciable tumor within the spinal  canal. Electronically Signed   By: Lorriane Shire M.D.   On: 08/27/2019 09:43    Labs:  CBC: Recent Labs    07/11/19 0807 08/01/19 0849 08/23/19 0756 08/29/19 1122  WBC 7.2 9.1 11.0* 6.1  HGB 9.7* 9.2* 8.9* 9.4*  HCT 30.9* 28.7* 28.4* 30.5*  PLT 311 323 322 334    COAGS: Recent Labs    05/01/19 0827 08/29/19 1122  INR 1.0 1.1    BMP: Recent Labs    06/20/19 0852 07/11/19 0807 08/01/19 0849 08/23/19 0756  NA 138 138 135 133*  K 3.6 3.9 3.5 4.2  CL 107 107 105 102  CO2 22 22 20* 20*  GLUCOSE 94 103* 133* 114*  BUN 15 14 21 16   CALCIUM 8.4* 8.5* 8.8* 8.9  CREATININE 0.97 0.72 0.80 0.75  GFRNONAA >60 >60 >60 >60  GFRAA >60 >60 >60 >60    LIVER FUNCTION TESTS: Recent Labs    06/20/19 0852 07/11/19 0807 08/01/19 0849 08/23/19 0756  BILITOT 0.3 0.4 0.4 0.4  AST 14* 17 19 22   ALT 9 8 10 11   ALKPHOS 70 71 72 74  PROT 5.7* 5.6* 5.7* 5.8*  ALBUMIN 2.9* 2.8* 2.9* 3.0*    Assessment and Plan: Pt with history of  metastatic prostate cancer with prior radiation/currently on chemotherapy and follow-up imaging revealing interval progression of disease with adenopathy and multifocal liver metastases.  Also with prior PE/RLE DVT (on xarelto). He is scheduled today for image guided liver lesion biopsy for further evaluation. Risks and benefits of procedure was discussed with the patient including, but not limited to bleeding, infection, damage to adjacent structures or low yield requiring additional tests.  All of the questions were answered and there is agreement to proceed.  Consent signed and in chart.     Electronically Signed: D. Rowe Robert, PA-C 08/29/2019, 12:31 PM   I spent a total of 25 minutes at the the patient's bedside AND on the patient's hospital floor or unit, greater than 50% of which was counseling/coordinating care for image guided liver lesion biopsy

## 2019-08-29 NOTE — Procedures (Signed)
Interventional Radiology Procedure Note  Procedure: US guided liver mass biopsy. Mx 18g core. .  Complications: None Recommendations:  - Ok to shower tomorrow - Do not submerge for 7 days - Routine care   Signed,  Dulcy Fanny. Earleen Newport, DO

## 2019-08-29 NOTE — Progress Notes (Signed)
Discharge instructions reviewed with pt and his wife both voice understanding.

## 2019-08-30 ENCOUNTER — Telehealth: Payer: Self-pay

## 2019-08-30 NOTE — Telephone Encounter (Signed)
Received call from patient spouse asking when the patient is to restart his Xarelto. Verbal order received to restart Xarelto today per Dr. [name]. Spouse also had questions about the patients upcoming appointments. Explained that would have to clarify the appointments with Dr. [name] and then call back. Contacted patient spouse again and explained that the 9/23 appointments have been cancelled and that they will get a call from the schedulers because the patient needs a lab and flush appt added on to 9/24 prior to the MD appointment. Spouse verbalized understanding and appreciative of the call.

## 2019-09-02 ENCOUNTER — Inpatient Hospital Stay: Payer: Medicare Other

## 2019-09-02 DIAGNOSIS — C61 Malignant neoplasm of prostate: Secondary | ICD-10-CM | POA: Diagnosis not present

## 2019-09-02 DIAGNOSIS — Z8042 Family history of malignant neoplasm of prostate: Secondary | ICD-10-CM | POA: Diagnosis not present

## 2019-09-02 DIAGNOSIS — R590 Localized enlarged lymph nodes: Secondary | ICD-10-CM | POA: Diagnosis not present

## 2019-09-02 DIAGNOSIS — R972 Elevated prostate specific antigen [PSA]: Secondary | ICD-10-CM | POA: Diagnosis not present

## 2019-09-03 ENCOUNTER — Other Ambulatory Visit: Payer: Self-pay | Admitting: Oncology

## 2019-09-03 ENCOUNTER — Other Ambulatory Visit: Payer: Self-pay

## 2019-09-03 ENCOUNTER — Ambulatory Visit
Admission: RE | Admit: 2019-09-03 | Discharge: 2019-09-03 | Disposition: A | Discharge status: Home / Self Care | Payer: Medicare Other | Source: Ambulatory Visit | Attending: Urology | Admitting: Urology

## 2019-09-03 ENCOUNTER — Telehealth: Payer: Self-pay | Admitting: Oncology

## 2019-09-03 DIAGNOSIS — C61 Malignant neoplasm of prostate: Secondary | ICD-10-CM | POA: Insufficient documentation

## 2019-09-03 DIAGNOSIS — Z192 Hormone resistant malignancy status: Secondary | ICD-10-CM | POA: Insufficient documentation

## 2019-09-03 DIAGNOSIS — Z79818 Long term (current) use of other agents affecting estrogen receptors and estrogen levels: Secondary | ICD-10-CM | POA: Diagnosis not present

## 2019-09-03 DIAGNOSIS — C7949 Secondary malignant neoplasm of other parts of nervous system: Secondary | ICD-10-CM

## 2019-09-03 DIAGNOSIS — C7951 Secondary malignant neoplasm of bone: Secondary | ICD-10-CM | POA: Insufficient documentation

## 2019-09-03 DIAGNOSIS — Z510 Encounter for antineoplastic radiation therapy: Secondary | ICD-10-CM | POA: Insufficient documentation

## 2019-09-03 DIAGNOSIS — Z9289 Personal history of other medical treatment: Secondary | ICD-10-CM | POA: Diagnosis not present

## 2019-09-03 NOTE — Progress Notes (Addendum)
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  Interval Since Last Radiation:  3.5 months  05/10/19//SRS: The targeted metastasis in the L4 vertebral body was treated to a prescription dose of 18 Gy in 1 fraction.  10/24/18: The targeted metastasis in theL1 and L3vertebral body were eachtreatedto a prescription dose of 18Gy in 1 fraction.   09/04/2018 - 09/17/2018:The lumbar spinetarget at L3-L4was treated to 30 Gy in 10 fractions of 3 Gy.  11/05/2015 - 12/10/2015: 45 Gy in 25 fractions to the pelvis followed by LDR boost, 110 Gy on 12/23/2015 at [facility] c/o Dr. Daleen Squibb.  Narrative:  I spoke with the patient to conduct his routine scheduled 3 month follow up visit to review his recent follow up lumbar MRI and recommendations from recent spine tumor board via telephone to spare the patient unnecessary potential exposure in the healthcare setting during the current COVID-19 pandemic.  The patient was notified in advance and gave permission to proceed with this visit format.                In summary, he was initially diagnosed with Gleason 5+4 adenocarcinoma of the prostate with a PSA of 13.66 in July 2016 under the care of Dr. Darvin Neighbours. He elected to proceed with a 5-week course of external beam radiotherapy followed by brachytherapy boost at [facility] with Dr. Carolin Sicks between 11/05/15 - 12/2015 in combination with 2 years of androgen deprivation therapy. PSA nadired at 1.0 in 07/2016. 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 Metastatic work-up at that time showed bone  involvement as well as lymphadenopathy. He was treated under the care of Dr. Renato Gails at [facility], initially with enzalutamide, which was started in May 2018 and discontinued in January 2019 when he presented with progression of disease with increased bony metastasis, adrenal nodule and worsening lymphadenopathy. At that time, his treatment was changed to Taxotere chemotherapy with his last treatment in July 2019- discontinued due to disease progression noted on repeat systemic imaging which showed a partial response with slight improvement in the bulky retroperitoneal lymphadenopathy but worsening bony metastasis based on a lumbar spine MRI obtained on June 24, 2018. The MRI showed a complete replacement of L3 vertebral body with tumor in the ventral epidural space behind L3, greater to the left. He elected to transfer his care to Dr. Clelia Croft at the [facility] since this is closer to home and has continued on androgen deprivation therapy with Lupron under the care of Dr. Earlene Plater with his last 33-month injection given in2/2020. He has also been receiving denosumab (Xgeva)every 6 weeks and Zytigawas added to his treatment regimen on 08/28/2018.          Due to progressive low back pain, he elected to proceed with surgical decompressive laminectomy on the left at L3 with complete removal of the left L3 lamina, microscopic foraminotomies of the left L3 and L4 nerve roots and microscopic removal of epidural tumor on the left under the care of Dr. Wynetta Emery on 08/06/2018. Final surgical pathology confirmed high-grade metastatic prostate adenocarcinoma. He completed postoperative palliative radiotherapy  to the lumbar spine at the levels of L3-L4on 09/17/18.   He did note improvement in his back pain following treatment but continued to struggle with weakness and limitations in mobility. Over time, his pain became progressively severe resulting in a visit to the emergency department on 10/16/2018 due to  intractable pain radiating into the lower extremities with progressive weakness.  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 He was started on IV dexamethasone with significant improvement in his symptoms. He was not felt to be a good candidate for further surgical intervention and therefore elected to proceed with an additional course of palliative SRS to the progressive/new sites of disease in the lumbar spine at L1 and L3 which was completed on 10/24/2018. He tolerated the treatment well and reported significant improvement in his pain following treatment.  His initial post-treatment MRI on 01/25/19 showed an excellent response to treatment with decreased ventral epidural tumor and overall decreased spinal stenosis. There was mild enlargement of the L1 metastasis which was felt most likely treatment related but also noted increased iliac chain lymphadenopathy. He continued on Lupron ADT q 6 months in combination with daily Zytiga. He continued taking Zytiga 1000 mg daily since September 2019 with Dr. Clelia Croft as well as Lupron every 6 months for ADT under the care and direction of Dr. Earlene Plater (last injection 01/2019), his urologist.  Unfortunately, his PSA continued to increase despite treatment with a PSA of 27.6 on 12/25/2018, 51 in 02/2019, and 115 on 04/23/19.  He had a follow-up visit with his medical oncologist, Dr. Clelia Croft on 04/25/2019 and the recommendation was to discontinue his Zytiga and switch to systemic chemotherapy with [name] in addition to his Lupron ADT.  He had a port-a-cath placed and started his systemic chemotherapy with [name] on Friday 05/10/19.  A follow upMRI  lumbar spine from 04/26/19 showed stable treated disease at L1 and L3 and a stable benign superior endplate compression fracture at L5 but there was a new metastatic lesion within the anterior L4 vertebral body measuring up to 2.5 cm in diameter and considerable progression of retroperitoneal lymphadenopathy since prior scan in February 2020.   He reported mildly progressive low back pain for 1-2 months but denied any new associated neurologic symptoms such as focal weakness in the lower extremities or paraesthesias.  The decision was to proceed with SRS to the new metastasis at L4 which was completed in a single fraction on 05/10/19 and tolerated well.  His PSA was 99.2 on 05/30/19, decreased from 129 on 05/10/19 which was encouraging.  Unfortunately, he required hospital admission on 05/17/19 for left sided chest/back pain and was found to have a small volume PE.  He will was initially treated with Lovenox injections but has since transitioned to oral Xarelto.  He normally receives his 73-month Lupron injections with Dr. Earlene Plater but unfortunately he was recently informed that the medication was on back order and they did not currently have this in stock at their office.  Therefore, he is coming to see Dr. Clelia Croft on 09/04/2019 at 3 PM to receive his 6 month Lupron injection.   He is status post 6 cycles of [name] chemotherapy with modest  PSA response initially but his PSA currently is rising.  Restaging CT C/A/P obtained on 08/20/2019 showed progression of disease including his hepatic metastasis as well as diffuse lymphadenopathy in the chest, abdomen and pelvis.  He had recent ultrasound-guided biopsy of the liver metastasis on 08/29/2019 with final pathology confirming metastatic adenocarcinoma consistent with high-grade, metastatic prostate adenocarcinoma.  Interval History:  Overall, he has continued to feel well and is currently without complaints aside from persistent low back pain into his legs bilaterally when he  stands for any period of time as well as progressive weakness in the lower extremities.  This is significantly impacting his functional status.  He had a repeat MRI lumbar spine on 08/27/19 which showed progressive metastatic disease at L5 with increased pathologic compression of the vertebral body but no neural impingement.  His imaging and case were reviewed and discussed at the recent multidisciplinary spine tumor board on 09/02/19 and consensus was that his previously treated sites of disease in the lumbar spine appeared stable but there was definite progressive disease at L5 as well as in the paraspinal soft tissues involving the left adrenal gland, periaortic lymph nodes, bilateral iliac chain lymph nodes and progressive extension into the psoas muscles bilaterally. Recommendation is to proceed with a 2 week course of palliative radiation directed to the progressive disease at L5 and delivered in 10 daily treatments. We discussed this today and the patient is in agreement with proceeding with treatment.     On review of systems, the patient states that he is doing relatively well overall.  His back pain had significantly improved but has been more progressive and radiating into the lower extremities over the past month.   He denies any new paresthesias but has noted progressive weakness in the lower extremities over the past 1-2 months as well.  The pain and weakness in the lower extremities is significantly impacting his functional status.  He also continues with generalized weakness, particularly notable for approximately 3 days after his systemic chemo treatments.  Otherwise, he feels well in general and feels that he is tolerating the [name] chemo treatments fairly well.  He reports a healthy appetite and is maintaining his weight.  He denies abdominal pain, nausea, vomiting, or diarrhea.  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  He  reports that this is unchanged recently and he manages the constipation with colace stool softeners daily and occasional use of stimulant laxatives about every 4 days.  We discussed switching to daily Miralax to safely keep him more regular with regards to his BMs.  ALLERGIES:  is allergic to statins.  Meds: Current Outpatient Medications  Medication Sig Dispense Refill   acetaminophen (TYLENOL) 500 MG tablet Take 500 mg by mouth daily. Pt takes 5 days after chemo     antiseptic oral rinse (BIOTENE) LIQD 15 mLs by Mouth Rinse route as needed for dry mouth.     Calcium Carb-Cholecalciferol (CALCIUM 1000 + D PO) Take 30 mLs by mouth 3 (three) times a week. Calcium 1000 mg / Vit D 1000 iu      cycloSPORINE (RESTASIS) 0.05 % ophthalmic emulsion Place 1 drop into both eyes 2 (two) times daily.     docusate sodium (COLACE) 100 MG capsule Take 100 mg by mouth 2 (two) times daily.     ezetimibe (ZETIA) 10 MG tablet Take 10 mg by mouth every morning.      ibuprofen (ADVIL,MOTRIN) 200 MG tablet Take  200 mg by mouth every 6 (six) hours as needed for moderate pain.     leuprolide (LUPRON) 30 MG injection Inject 45 mg into the muscle every 6 (six) months.     lidocaine-prilocaine (EMLA) cream Apply 1 application topically as needed. 30 g 0   Loratadine (CLARITIN) 10 MG CAPS Take 10 mg by mouth daily. Patient takes every day for 5 days after chemo treatment.     megestrol (MEGACE) 400 MG/10ML suspension Take 10 mLs (400 mg total) by mouth 2 (two) times daily. 240 mL 0   ondansetron (ZOFRAN) 4 MG tablet Take 4 mg by mouth every 8 (eight) hours as needed for nausea or vomiting.     oxyCODONE (OXY IR/ROXICODONE) 5 MG immediate release tablet Take 1-2 tablets (5-10 mg total) by mouth every 4 (four) hours as needed for severe pain. 90 tablet 0   pantoprazole (PROTONIX) 40 MG tablet Take 40 mg by mouth daily after supper.      polyethylene glycol (MIRALAX / GLYCOLAX) packet Take 17 g by mouth daily  as needed for mild constipation. 14 each 0   predniSONE (DELTASONE) 5 MG tablet TAKE 1 TABLET BY MOUTH 2 TIMES DAILY WITH A MEAL. (Patient taking differently: Take 5 mg by mouth 2 (two) times daily with a meal. ) 60 tablet 2   prochlorperazine (COMPAZINE) 10 MG tablet Take 1 tablet (10 mg total) by mouth every 6 (six) hours as needed for nausea or vomiting. 30 tablet 0   tamsulosin (FLOMAX) 0.4 MG CAPS capsule Take 0.4 mg by mouth daily.      traMADol (ULTRAM) 50 MG tablet TAKE 1 TABLET BY MOUTH EVERY 6 HOURS AS NEEDED FOR PAIN 60 tablet 0   triamcinolone cream (KENALOG) 0.1 % Apply 1 application topically daily as needed (dry skin in the winter).      XARELTO 20 MG TABS tablet TAKE 1 TABLET (20 MG TOTAL) BY MOUTH DAILY WITH SUPPER FOR 30 DAYS. (Patient taking differently: Take 20 mg by mouth daily with supper. ) 90 tablet 1   No current facility-administered medications for this encounter.     Physical Findings:  vitals were not taken for this visit.   /Unable to assess due to telephone follow-up visit format.  Lab Findings: Lab Results  Component Value Date   WBC 6.1 08/29/2019   HGB 9.4 (L) 08/29/2019   HCT 30.5 (L) 08/29/2019   MCV 97.1 08/29/2019   PLT 334 08/29/2019     Radiographic Findings: Ct Chest W Contrast  Result Date: 08/20/2019 CLINICAL DATA:  Restaging metastatic prostate cancer EXAM: CT CHEST, ABDOMEN, AND PELVIS WITH CONTRAST TECHNIQUE: Multidetector CT imaging of the chest, abdomen and pelvis was performed following the standard protocol during bolus administration of intravenous contrast. CONTRAST:  OMNIPAQUE IOHEXOL 300 MG/ML  SOLN COMPARISON:  04/23/2019. FINDINGS: CT CHEST FINDINGS Cardiovascular: Normal heart size. No pericardial effusion. There is tumor encasement of the left common carotid artery with luminal narrowing of this vessel. This is a new finding compared with 07/23/2018. Mediastinum/Nodes: Normal appearance of the thyroid gland. The trachea  appears patent and is midline. Normal appearance of the esophagus. Left supraclavicular nodal mass measures 3.6 x 2.7 cm, image 4/2. This encases the left common carotid artery as before. On the previous exam this measured 3.1 x 2.0 cm. Bilateral mediastinal adenopathy is identified, increased from previous exam. Pre-vascular lymph node measures 1.5 cm, image 22/2. Previously 0.5 cm. Right paratracheal lymph node measures 1.1 cm, image 22/2. Previously  1.1 cm. Left paratracheal lymph node measures 1.7 cm, image 25/2. Previously 0.6 cm. Right hilar lymph node measures 1.9 cm, image 27/2. Previously 0.9 cm. Lungs/Pleura: No pleural effusion identified. Subsolid nodule within the anterior right upper lobe measures 9 mm, image 38/4. Previously 1.3 cm. No new or enlarging nodule. Musculoskeletal: No chest wall mass or suspicious bone lesions identified. CT ABDOMEN PELVIS FINDINGS Hepatobiliary: Multiple low-attenuation liver lesions are identified six. These are increased in number from previous exam. Previous index lesion within posterior right lobe measures 6.3 cm, image 56/2. Previously 6.1 cm. Multiple, new liver lesions are noted which are too numerous to count including lateral segment of left lobe of liver lesion measuring 3 cm, image 61/2. Anterior dome of liver lesion measures 2.3 cm, image 49/2. Medial segment left lobe of liver lesion measures 2.3 cm, image 58/2. Gallbladder is normal. No biliary dilatation. Pancreas: Unremarkable. No pancreatic ductal dilatation or surrounding inflammatory changes. Spleen: Normal in size without focal abnormality. Adrenals/Urinary Tract: Normal right adrenal gland. Mass in the left adrenal gland measures 6 cm, image 56/2. Unchanged from previous exam. Bilateral kidney cysts. No mass or hydronephrosis. The urinary bladder is largely obscured by beam hardening artifact from patient's bilateral hip arthroplasty devices. Stomach/Bowel: Stomach is within normal limits. Appendix  appears normal. No evidence of bowel wall thickening, distention, or inflammatory changes. Vascular/Lymphatic: Aortic atherosclerosis. No aneurysm. Extensive abdominopelvic adenopathy is again noted. Index left retroperitoneal node measures 1.4 cm, image 72/2. Previously 1 cm. Index left retro caval lymph node measures 1.8 cm, image 73/2. Previously 1.1 cm. Index right common iliac adenopathy measures 3.2 cm, image 89/2. Previously 3.6 cm. Right pelvic sidewall lymph node measures 2 cm, image 107/2. Previously 1.7 cm. Reproductive: Seed implants noted within the prostate gland. Other: No free fluid or fluid collections. Musculoskeletal: Similar appearance of compression deformities involving L3 and L5 vertebra. There is mild retropulsion of fracture fragments at these levels. Sclerotic metastasis involving the left ischial ramus and left inferior pubic rami is similar to previous exam. IMPRESSION: 1. Interval progression of disease. Increase in size of left supraclavicular, mediastinal and right hilar nodal metastasis. 2. 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 This is a new finding when compared with 07/23/2018. Compared with 04/23/2019 the left supraclavicular nodal mass has increased in size with progressive narrowing of the vessel. 3. Interval progression of multifocal liver metastasis. 4. Increase in nodal metastasis within the abdomen and pelvis. 5. Similar appearance of sclerotic metastasis involving the left ischial ramus. 6. Stable appearance of L3 and L5 compression fractures. Electronically Signed   By: Signa Kell M.D.   On: 08/20/2019 15:43   Mr Lumbar Spine W Wo Contrast  Result Date: 08/27/2019 CLINICAL DATA:  Low back pain and bilateral leg pain and weakness for 1 month. Metastatic prostate cancer to bone. EXAM: MRI LUMBAR SPINE WITHOUT AND WITH CONTRAST TECHNIQUE: Multiplanar and multiecho pulse sequences of the lumbar spine were obtained  without and with intravenous contrast. CONTRAST:  17mL MULTIHANCE GADOBENATE DIMEGLUMINE 529 MG/ML IV SOLN COMPARISON:  MRI dated 04/26/2019 and CT scan of the abdomen and pelvis dated 08/20/2019 FINDINGS: Segmentation:  Standard. Alignment: Straightening of the normal lumbar lordosis. Retropulsion of the posterior margin of L3 into the spinal canal due to the pathologic fracture, unchanged. Vertebrae: Progressive metastatic lesions throughout the lumbar spine. Conus medullaris and cauda equina: Conus extends to the L1-2 level. Conus and cauda equina appear normal. Paraspinal and other soft tissues: Progressive  metastatic disease involving the left adrenal gland, periaortic lymph nodes, bilateral iliac chain lymph nodes, with progressive extension into the psoas muscles bilaterally. Disc levels: T12-L1: The disc is normal. Progression of the metastatic disease in the T12 vertebral body without extension into the spinal canal. No neural impingement. L1-2: Disc desiccation with a small broad-based disc bulge without neural impingement. Progression of metastatic disease in the L1 vertebral body without extension into the spinal canal. L2-3: Stable old pathologic fracture of L1 with protrusion of the posterior margin of L1 into the spinal canal. Previous left decompressive laminectomy. No new impingement upon the spinal canal. Chronic hypertrophy of the right facet joint compresses the right lateral recess, unchanged. L3-4: Small broad-based disc bulge with chronic hypertrophy of the ligamentum flavum and facet joints creating mild narrowing of the spinal canal as well as persistent right foraminal stenosis. Progression of metastatic disease in the L4 vertebral body. L4-5: Progressive compression fracture of the L4 vertebral body due to metastatic disease. Small broad-based disc bulge without change. Bilateral foraminal stenosis, unchanged. L5-S1: Normal disc. Progressive tumor in the L5 vertebra with increased  pathologic compression of the vertebral body. No neural impingement. IMPRESSION: 1. 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 2. No new neural impingement. No severe spinal stenosis. No appreciable tumor within the spinal canal. Electronically Signed   By: Francene Boyers M.D.   On: 08/27/2019 09:43   Ct Abdomen Pelvis W Contrast  Result Date: 08/20/2019 CLINICAL DATA:  Restaging metastatic prostate cancer EXAM: CT CHEST, ABDOMEN, AND PELVIS WITH CONTRAST TECHNIQUE: Multidetector CT imaging of the chest, abdomen and pelvis was performed following the standard protocol during bolus administration of intravenous contrast. CONTRAST:  OMNIPAQUE IOHEXOL 300 MG/ML  SOLN COMPARISON:  04/23/2019. FINDINGS: CT CHEST FINDINGS Cardiovascular: Normal heart size. No pericardial effusion. There is tumor encasement of the left common carotid artery with luminal narrowing of this vessel. This is a new finding compared with 07/23/2018. Mediastinum/Nodes: Normal appearance of the thyroid gland. The trachea appears patent and is midline. Normal appearance of the esophagus. Left supraclavicular nodal mass measures 3.6 x 2.7 cm, image 4/2. This encases the left common carotid artery as before. On the previous exam this measured 3.1 x 2.0 cm. Bilateral mediastinal adenopathy is identified, increased from previous exam. Pre-vascular lymph node measures 1.5 cm, image 22/2. Previously 0.5 cm. Right paratracheal lymph node measures 1.1 cm, image 22/2. Previously 1.1 cm. Left paratracheal lymph node measures 1.7 cm, image 25/2. Previously 0.6 cm. Right hilar lymph node measures 1.9 cm, image 27/2. Previously 0.9 cm. Lungs/Pleura: No pleural effusion identified. Subsolid nodule within the anterior right upper lobe measures 9 mm, image 38/4. Previously 1.3 cm. No new or enlarging nodule. Musculoskeletal: No chest wall mass or suspicious bone lesions identified. CT ABDOMEN  PELVIS FINDINGS Hepatobiliary: Multiple low-attenuation liver lesions are identified six. These are increased in number from previous exam. Previous index lesion within posterior right lobe measures 6.3 cm, image 56/2. Previously 6.1 cm. Multiple, new liver lesions are noted which are too numerous to count including lateral segment of left lobe of liver lesion measuring 3 cm, image 61/2. Anterior dome of liver lesion measures 2.3 cm, image 49/2. Medial segment left lobe of liver lesion measures 2.3 cm, image 58/2. Gallbladder is normal. No biliary dilatation. Pancreas: Unremarkable. No pancreatic ductal dilatation or surrounding inflammatory changes. Spleen: Normal in size without focal abnormality. Adrenals/Urinary Tract: Normal right adrenal gland. Mass in the  left adrenal gland measures 6 cm, image 56/2. Unchanged from previous exam. Bilateral kidney cysts. No mass or hydronephrosis. The urinary bladder is largely obscured by beam hardening artifact from patient's bilateral hip arthroplasty devices. Stomach/Bowel: Stomach is within normal limits. Appendix appears normal. No evidence of bowel wall thickening, distention, or inflammatory changes. Vascular/Lymphatic: Aortic atherosclerosis. No aneurysm. Extensive abdominopelvic adenopathy is again noted. Index left retroperitoneal node measures 1.4 cm, image 72/2. Previously 1 cm. Index left retro caval lymph node measures 1.8 cm, image 73/2. Previously 1.1 cm. Index right common iliac adenopathy measures 3.2 cm, image 89/2. Previously 3.6 cm. Right pelvic sidewall lymph node measures 2 cm, image 107/2. Previously 1.7 cm. Reproductive: Seed implants noted within the prostate gland. Other: No free fluid or fluid collections. Musculoskeletal: Similar appearance of compression deformities involving L3 and L5 vertebra. There is mild retropulsion of fracture fragments at these levels. Sclerotic metastasis involving the left ischial ramus and left inferior pubic rami is  similar to previous exam. IMPRESSION: 1. Interval progression of disease. Increase in size of left supraclavicular, mediastinal and right hilar nodal metastasis. 2. 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 This is a new finding when compared with 07/23/2018. Compared with 04/23/2019 the left supraclavicular nodal mass has increased in size with progressive narrowing of the vessel. 3. Interval progression of multifocal liver metastasis. 4. Increase in nodal metastasis within the abdomen and pelvis. 5. Similar appearance of sclerotic metastasis involving the left ischial ramus. 6. Stable appearance of L3 and L5 compression fractures. Electronically Signed   By: Signa Kell M.D.   On: 08/20/2019 15:43   US Biopsy (liver)  Result Date: 08/29/2019 INDICATION: 78 year old male with a history of liver metastatic disease, with a history of prostate carcinoma EXAM: ULTRASOUND-GUIDED BIOPSY LIVER MASS MEDICATIONS: None. ANESTHESIA/SEDATION: Moderate (conscious) sedation was employed during this procedure. A total of Versed 1.0 mg and Fentanyl 50 mcg was administered intravenously. Moderate Sedation Time: 10 minutes. [name] level of consciousness and vital signs were monitored continuously by radiology nursing throughout the procedure under my direct supervision. FLUOROSCOPY TIME:  NONE COMPLICATIONS: NONE PROCEDURE: Informed written consent was obtained from the patient after a thorough discussion of the procedural risks, benefits and alternatives. All questions were addressed. Maximal Sterile Barrier Technique was utilized including caps, mask, sterile gowns, sterile gloves, sterile drape, hand hygiene and skin antiseptic. A timeout was performed prior to the initiation of the procedure. Patient positioned supine position on the ultrasound stretcher. Ultrasound images were acquired with images stored sent to PACs. Patient is prepped and draped in the usual sterile  fashion. 1% lidocaine was used for local anesthesia. Using ultrasound guidance, 17 gauge guide needle was advanced to the margin of the dominant right liver lobe mass. Multiple 18 gauge core biopsy were acquired. Tissue specimen sent to pathology. Gel-Foam pledgets were infused with a small amount of saline. Needle was removed and a final image was stored. Sterile bandage was placed. Patient tolerated the procedure well and remained hemodynamically stable throughout. No complications were encountered and no significant blood loss. IMPRESSION: Status post ultrasound-guided biopsy of right liver mass. Tissue specimen sent to pathology for complete histopathologic analysis. Signed, Yvone Neu. Reyne Dumas, RPVI [facility] Radiology Electronically Signed   By: Gilmer Mor D.O.   On: 08/29/2019 14:50    Impression/Plan: 1. 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 He had  a repeat MRI lumbar spine on 08/27/19 which showed progressive metastatic disease at L5 with increased pathologic compression of the vertebral body but no neural impingement.  His imaging and case were reviewed and discussed at the recent multidisciplinary spine tumor board on 09/02/19 and consensus was that his previously treated sites of disease in the lumbar spine appeared stable but there was definite progressive disease at L5 as well as in the paraspinal soft tissues involving the left adrenal gland, periaortic lymph nodes, bilateral iliac chain lymph nodes and progressive extension into the psoas muscles bilaterally. Recommendation is to proceed with a 2 week course of palliative radiation directed to the progressive disease at L5 and delivered in 10 daily treatments.  We discussed the available radiation techniques, and focused on the details of logistics and delivery. We reviewed the anticipated acute and late sequelae  associated with radiation in this setting. The patient was encouraged to ask questions that were answered to his satisfaction. He appears to have a good understanding of these recommendations and is comfortable and in agreement with the stated plan.  At the conclusion of our conversation, the patient elects to proceed with palliative radiotherapy directed to the progressive metastatic disease at L5.  He has provided verbal consent to proceed today and is scheduled for CT simulation/treatment planning on Wednesday, 09/04/2019 in anticipation of beginning a 10-day course of daily radiation treatments on Monday, 09/09/2019.  He will sign formal written consent at the time of his CT simulation and a copy of this document will be placed in his medical record. He knows to call at anytime with any questions or concerns in the interim.  Given current concerns for patient exposure during the COVID-19 pandemic, this encounter was conducted via telephone. The patient was notified in advance and was offered a WebEX meeting to allow for face to face communication but unfortunately reported that he did not have the appropriate resources/technology to support such a visit and instead preferred to proceed with telephone follow up visit instead. The patient has given verbal consent for this type of encounter. The time spent during this encounter was 25 minutes. The attendants for this meeting include  Shelonda Saxe PA-C, patient, Mekel Bridewell and his wife, Darel Hong. During the encounter, Brelee Renk PA-C, was located at [facility] Radiation Oncology Department.  Patient,  Ananth Maisonet and his wife, Darel Hong were located at home.   Marguarite Arbour, PA-C

## 2019-09-03 NOTE — Telephone Encounter (Signed)
Scheduled appt per 9/11 sch message - pt aware of appt date and time   

## 2019-09-04 ENCOUNTER — Ambulatory Visit
Admission: RE | Admit: 2019-09-04 | Discharge: 2019-09-04 | Disposition: A | Discharge status: Home / Self Care | Payer: Medicare Other | Source: Ambulatory Visit | Attending: Radiation Oncology | Admitting: Radiation Oncology

## 2019-09-04 ENCOUNTER — Ambulatory Visit: Payer: Medicare Other | Admitting: Urology

## 2019-09-04 ENCOUNTER — Other Ambulatory Visit: Payer: Self-pay

## 2019-09-04 DIAGNOSIS — C61 Malignant neoplasm of prostate: Secondary | ICD-10-CM | POA: Diagnosis not present

## 2019-09-04 DIAGNOSIS — C7951 Secondary malignant neoplasm of bone: Secondary | ICD-10-CM

## 2019-09-04 DIAGNOSIS — Z510 Encounter for antineoplastic radiation therapy: Secondary | ICD-10-CM | POA: Diagnosis not present

## 2019-09-04 DIAGNOSIS — Z192 Hormone resistant malignancy status: Secondary | ICD-10-CM | POA: Diagnosis not present

## 2019-09-05 ENCOUNTER — Other Ambulatory Visit: Payer: Self-pay | Admitting: Oncology

## 2019-09-05 ENCOUNTER — Telehealth: Payer: Self-pay

## 2019-09-05 NOTE — Telephone Encounter (Signed)
Dr. [name] is adding Carboplatin to patient's treatment plan starting 09/12/19. Message sent to authorization department and pharmacy making both aware of the change.

## 2019-09-06 DIAGNOSIS — Z23 Encounter for immunization: Secondary | ICD-10-CM | POA: Diagnosis not present

## 2019-09-06 NOTE — Progress Notes (Signed)
  Radiation Oncology         [phone] ________________________________  Name: [name] MRN: [id number]  Date: 09/04/2019  DOB: 1941-11-09  SIMULATION AND TREATMENT PLANNING NOTE    ICD-10-CM   1. Prostate cancer metastatic to bone [facility])  C61    C79.51     DIAGNOSIS:  78 y.o. 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  Identity was confirmed.  All relevant records and images related to the planned course of therapy were reviewed.  The patient freely provided informed written consent to proceed with treatment after reviewing the details related to the planned course of therapy. The consent form was witnessed and verified by the simulation staff.  Then, the patient was set-up in a stable reproducible  supine position for radiation therapy.  CT images were obtained.  Surface markings were placed.  The CT images were loaded into the planning software.  Then the target and avoidance structures were contoured including kidneys.  Treatment planning then occurred.  The radiation prescription was entered and confirmed.  Then, I designed and supervised the construction of a total of 3 medically necessary complex treatment devices with VacLoc positioner and 2 MLCs to shield kidneys.  I have requested : 3D Simulation  I have requested a DVH of the following structures: Left Kidney, Right Kidney and target.  SPECIAL TREATMENT PROCEDURE:  The planned course of therapy using radiation constitutes a special treatment procedure. Special care is required in the management of this patient for the following reasons. This treatment constitutes a Special Treatment Procedure for the following reason: [ Retreatment in a previously radiated area requiring careful monitoring of increased risk of toxicity due to overlap of previous treatment..  The special nature of the planned course of radiotherapy will require increased physician supervision and  oversight to ensure patient's safety with optimal treatment outcomes.  PLAN:  The patient will receive 30 Gy in 10 fractions.  ________________________________  [name], M.D.

## 2019-09-09 ENCOUNTER — Ambulatory Visit
Admission: RE | Admit: 2019-09-09 | Discharge: 2019-09-09 | Disposition: A | Discharge status: Home / Self Care | Payer: Medicare Other | Source: Ambulatory Visit | Attending: Radiation Oncology | Admitting: Radiation Oncology

## 2019-09-09 ENCOUNTER — Other Ambulatory Visit: Payer: Self-pay

## 2019-09-09 DIAGNOSIS — C7951 Secondary malignant neoplasm of bone: Secondary | ICD-10-CM | POA: Diagnosis not present

## 2019-09-09 DIAGNOSIS — C61 Malignant neoplasm of prostate: Secondary | ICD-10-CM | POA: Diagnosis not present

## 2019-09-09 DIAGNOSIS — Z192 Hormone resistant malignancy status: Secondary | ICD-10-CM | POA: Diagnosis not present

## 2019-09-09 DIAGNOSIS — Z510 Encounter for antineoplastic radiation therapy: Secondary | ICD-10-CM | POA: Diagnosis not present

## 2019-09-10 ENCOUNTER — Encounter: Payer: Self-pay | Admitting: Oncology

## 2019-09-10 ENCOUNTER — Ambulatory Visit
Admission: RE | Admit: 2019-09-10 | Discharge: 2019-09-10 | Disposition: A | Discharge status: Home / Self Care | Payer: Medicare Other | Source: Ambulatory Visit | Attending: Radiation Oncology | Admitting: Radiation Oncology

## 2019-09-10 DIAGNOSIS — Z510 Encounter for antineoplastic radiation therapy: Secondary | ICD-10-CM | POA: Diagnosis not present

## 2019-09-10 DIAGNOSIS — C7951 Secondary malignant neoplasm of bone: Secondary | ICD-10-CM | POA: Diagnosis not present

## 2019-09-10 DIAGNOSIS — C61 Malignant neoplasm of prostate: Secondary | ICD-10-CM | POA: Diagnosis not present

## 2019-09-10 DIAGNOSIS — Z192 Hormone resistant malignancy status: Secondary | ICD-10-CM | POA: Diagnosis not present

## 2019-09-11 ENCOUNTER — Telehealth: Payer: Self-pay

## 2019-09-11 ENCOUNTER — Ambulatory Visit
Admission: RE | Admit: 2019-09-11 | Discharge: 2019-09-11 | Disposition: A | Discharge status: Home / Self Care | Payer: Medicare Other | Source: Ambulatory Visit | Attending: Radiation Oncology | Admitting: Radiation Oncology

## 2019-09-11 ENCOUNTER — Other Ambulatory Visit: Payer: Medicare Other

## 2019-09-11 ENCOUNTER — Ambulatory Visit: Payer: Medicare Other

## 2019-09-11 ENCOUNTER — Other Ambulatory Visit: Payer: Self-pay

## 2019-09-11 DIAGNOSIS — Z192 Hormone resistant malignancy status: Secondary | ICD-10-CM | POA: Diagnosis not present

## 2019-09-11 DIAGNOSIS — Z510 Encounter for antineoplastic radiation therapy: Secondary | ICD-10-CM | POA: Diagnosis not present

## 2019-09-11 DIAGNOSIS — C61 Malignant neoplasm of prostate: Secondary | ICD-10-CM | POA: Diagnosis not present

## 2019-09-11 DIAGNOSIS — C7951 Secondary malignant neoplasm of bone: Secondary | ICD-10-CM | POA: Diagnosis not present

## 2019-09-11 NOTE — Telephone Encounter (Signed)
Request from Dr. [name] to have patient's wife, [name] present during appointment. Director Lurlean Horns has given ok for wife to be physically present during MD visit only. Patient's wife [name] called and notified of Dr. Hazeline Junker request as well as her being allowed to be present only during the MD visit. Patient's wife verbalized understanding.

## 2019-09-12 ENCOUNTER — Inpatient Hospital Stay: Payer: Medicare Other

## 2019-09-12 ENCOUNTER — Other Ambulatory Visit: Payer: Self-pay

## 2019-09-12 ENCOUNTER — Other Ambulatory Visit: Payer: Medicare Other

## 2019-09-12 ENCOUNTER — Ambulatory Visit
Admission: RE | Admit: 2019-09-12 | Discharge: 2019-09-12 | Disposition: A | Discharge status: Home / Self Care | Payer: Medicare Other | Source: Ambulatory Visit | Attending: Radiation Oncology | Admitting: Radiation Oncology

## 2019-09-12 ENCOUNTER — Inpatient Hospital Stay (HOSPITAL_BASED_OUTPATIENT_CLINIC_OR_DEPARTMENT_OTHER): Payer: Medicare Other | Admitting: Oncology

## 2019-09-12 VITALS — BP 121/54 | HR 98 | Temp 98.5°F | Resp 18 | Ht 71.0 in | Wt 174.9 lb

## 2019-09-12 DIAGNOSIS — C61 Malignant neoplasm of prostate: Secondary | ICD-10-CM

## 2019-09-12 DIAGNOSIS — C7951 Secondary malignant neoplasm of bone: Secondary | ICD-10-CM | POA: Diagnosis not present

## 2019-09-12 DIAGNOSIS — C787 Secondary malignant neoplasm of liver and intrahepatic bile duct: Secondary | ICD-10-CM | POA: Diagnosis not present

## 2019-09-12 DIAGNOSIS — G893 Neoplasm related pain (acute) (chronic): Secondary | ICD-10-CM | POA: Diagnosis not present

## 2019-09-12 DIAGNOSIS — Z95828 Presence of other vascular implants and grafts: Secondary | ICD-10-CM

## 2019-09-12 DIAGNOSIS — C778 Secondary and unspecified malignant neoplasm of lymph nodes of multiple regions: Secondary | ICD-10-CM | POA: Diagnosis not present

## 2019-09-12 DIAGNOSIS — Z192 Hormone resistant malignancy status: Secondary | ICD-10-CM | POA: Diagnosis not present

## 2019-09-12 DIAGNOSIS — Z5111 Encounter for antineoplastic chemotherapy: Secondary | ICD-10-CM | POA: Diagnosis not present

## 2019-09-12 DIAGNOSIS — Z510 Encounter for antineoplastic radiation therapy: Secondary | ICD-10-CM | POA: Diagnosis not present

## 2019-09-12 LAB — CBC WITH DIFFERENTIAL (CANCER CENTER ONLY)
Abs Immature Granulocytes: 0.06 10*3/uL (ref 0.00–0.07)
Basophils Absolute: 0.1 10*3/uL (ref 0.0–0.1)
Basophils Relative: 1 %
Eosinophils Absolute: 0 10*3/uL (ref 0.0–0.5)
Eosinophils Relative: 0 %
HCT: 29.1 % — ABNORMAL LOW (ref 39.0–52.0)
Hemoglobin: 9.1 g/dL — ABNORMAL LOW (ref 13.0–17.0)
Immature Granulocytes: 1 %
Lymphocytes Relative: 4 %
Lymphs Abs: 0.4 10*3/uL — ABNORMAL LOW (ref 0.7–4.0)
MCH: 29.1 pg (ref 26.0–34.0)
MCHC: 31.3 g/dL (ref 30.0–36.0)
MCV: 93 fL (ref 80.0–100.0)
Monocytes Absolute: 1.1 10*3/uL — ABNORMAL HIGH (ref 0.1–1.0)
Monocytes Relative: 11 %
Neutro Abs: 8.8 10*3/uL — ABNORMAL HIGH (ref 1.7–7.7)
Neutrophils Relative %: 83 %
Platelet Count: 351 10*3/uL (ref 150–400)
RBC: 3.13 MIL/uL — ABNORMAL LOW (ref 4.22–5.81)
RDW: 18.7 % — ABNORMAL HIGH (ref 11.5–15.5)
WBC Count: 10.5 10*3/uL (ref 4.0–10.5)
nRBC: 0 % (ref 0.0–0.2)

## 2019-09-12 LAB — CMP (CANCER CENTER ONLY)
ALT: 12 U/L (ref 0–44)
AST: 26 U/L (ref 15–41)
Albumin: 3 g/dL — ABNORMAL LOW (ref 3.5–5.0)
Alkaline Phosphatase: 79 U/L (ref 38–126)
Anion gap: 8 (ref 5–15)
BUN: 16 mg/dL (ref 8–23)
CO2: 19 mmol/L — ABNORMAL LOW (ref 22–32)
Calcium: 8.1 mg/dL — ABNORMAL LOW (ref 8.9–10.3)
Chloride: 109 mmol/L (ref 98–111)
Creatinine: 0.71 mg/dL (ref 0.61–1.24)
GFR, Est AFR Am: >60 mL/min (ref >60)
GFR, Estimated: >60 mL/min (ref >60)
Glucose, Bld: 94 mg/dL (ref 70–99)
Potassium: 3.8 mmol/L (ref 3.5–5.1)
Sodium: 136 mmol/L (ref 135–145)
Total Bilirubin: 0.4 mg/dL (ref 0.3–1.2)
Total Protein: 5.5 g/dL — ABNORMAL LOW (ref 6.5–8.1)

## 2019-09-12 MED ORDER — PEGFILGRASTIM 6 MG/0.6ML ~~LOC~~ PSKT
PREFILLED_SYRINGE | SUBCUTANEOUS | Status: AC
  Filled 2019-09-12: qty 0.6

## 2019-09-12 MED ORDER — DIPHENHYDRAMINE HCL 50 MG/ML IJ SOLN
25.0000 mg | Freq: Once | INTRAMUSCULAR | Status: AC
  Administered 2019-09-12: 25 mg via INTRAVENOUS

## 2019-09-12 MED ORDER — SODIUM CHLORIDE 0.9% FLUSH
10.0000 mL | Freq: Once | INTRAVENOUS | Status: AC
  Administered 2019-09-12: 08:00:00 10 mL
  Filled 2019-09-12: qty 10

## 2019-09-12 MED ORDER — SODIUM CHLORIDE 0.9 % IV SOLN
Freq: Once | INTRAVENOUS | Status: AC
  Administered 2019-09-12: 10:00:00 via INTRAVENOUS
  Filled 2019-09-12: qty 250

## 2019-09-12 MED ORDER — SODIUM CHLORIDE 0.9 % IV SOLN
20.0000 mg/m2 | Freq: Once | INTRAVENOUS | Status: AC
  Administered 2019-09-12: 12:00:00 42 mg via INTRAVENOUS
  Filled 2019-09-12: qty 4.2

## 2019-09-12 MED ORDER — SODIUM CHLORIDE 0.9 % IV SOLN
Freq: Once | INTRAVENOUS | Status: AC
  Administered 2019-09-12: 10:00:00 via INTRAVENOUS
  Filled 2019-09-12: qty 5

## 2019-09-12 MED ORDER — DEXAMETHASONE SODIUM PHOSPHATE 10 MG/ML IJ SOLN
INTRAMUSCULAR | Status: AC
  Filled 2019-09-12: qty 1

## 2019-09-12 MED ORDER — FAMOTIDINE IN NACL 20-0.9 MG/50ML-% IV SOLN
20.0000 mg | Freq: Once | INTRAVENOUS | Status: AC
  Administered 2019-09-12: 20 mg via INTRAVENOUS

## 2019-09-12 MED ORDER — PALONOSETRON HCL INJECTION 0.25 MG/5ML
INTRAVENOUS | Status: AC
  Filled 2019-09-12: qty 5

## 2019-09-12 MED ORDER — LEUPROLIDE ACETATE (4 MONTH) 30 MG ~~LOC~~ KIT
30.0000 mg | PACK | Freq: Once | SUBCUTANEOUS | Status: AC
  Administered 2019-09-12: 11:00:00 30 mg via SUBCUTANEOUS
  Filled 2019-09-12: qty 30

## 2019-09-12 MED ORDER — PALONOSETRON HCL INJECTION 0.25 MG/5ML
0.2500 mg | Freq: Once | INTRAVENOUS | Status: AC
  Administered 2019-09-12: 10:00:00 0.25 mg via INTRAVENOUS

## 2019-09-12 MED ORDER — DEXAMETHASONE SODIUM PHOSPHATE 10 MG/ML IJ SOLN: 10.0000 mg | Freq: Once | INTRAMUSCULAR | Status: DC

## 2019-09-12 MED ORDER — HEPARIN SOD (PORK) LOCK FLUSH 100 UNIT/ML IV SOLN
500.0000 [IU] | Freq: Once | INTRAVENOUS | Status: AC | PRN
  Administered 2019-09-12: 13:00:00 500 [IU]
  Filled 2019-09-12: qty 5

## 2019-09-12 MED ORDER — DIPHENHYDRAMINE HCL 50 MG/ML IJ SOLN
INTRAMUSCULAR | Status: AC
  Filled 2019-09-12: qty 1

## 2019-09-12 MED ORDER — SODIUM CHLORIDE 0.9% FLUSH
10.0000 mL | INTRAVENOUS | Status: DC | PRN
  Administered 2019-09-12: 13:00:00 10 mL
  Filled 2019-09-12: qty 10

## 2019-09-12 MED ORDER — PEGFILGRASTIM 6 MG/0.6ML ~~LOC~~ PSKT: 6.0000 mg | PREFILLED_SYRINGE | Freq: Once | SUBCUTANEOUS | Status: DC

## 2019-09-12 MED ORDER — SODIUM CHLORIDE 0.9 % IV SOLN
400.0000 mg | Freq: Once | INTRAVENOUS | Status: AC
  Administered 2019-09-12: 400 mg via INTRAVENOUS
  Filled 2019-09-12: qty 40

## 2019-09-12 MED ORDER — FAMOTIDINE IN NACL 20-0.9 MG/50ML-% IV SOLN
INTRAVENOUS | Status: AC
  Filled 2019-09-12: qty 50

## 2019-09-12 NOTE — Progress Notes (Signed)
Hematology and Oncology Follow Up Visit  [name] [id number] 1941-05-28 78 y.o. 09/12/2019 8:26 AM [name], MDPharr, Thayer Jew, MD   Principle Diagnosis: 78 year old man with prostate cancer diagnosed in 2017.  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  He has documented bone and hepatic metastasis.  Prior Therapy: He was treated there with external beam radiation and brachii therapy in January 2017 and completed 2 years of androgen deprivation.  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  Metastatic work-up at that time showed bone involvement as well as lymphadenopathy.    He was treated under the care of Dr. [name] at [facility] initially with enzalutamide started in May 2018 until January 2019 where he presented with progression of disease with bony metastasis, adrenal nodule as well as worsening adenopathy.    Taxotere chemotherapy initially at 60 mg/m and that was reduced to 45 mg/m after cycle 6.  His last treatment was in July 2019 given at [facility].  Repeat imaging studies showed his lymphadenopathy has improved but he did develop worsening bony metastasis based on a lumbar spine obtained on June 24, 2018.  The MRI showed a complete replacement of L3 vertebral body with tumor the tumor in the ventral epidural space behind L3 greater to the left.  Bulky retroperitoneal adenopathy was slightly improved as mentioned.    He is status post decompressive laminectomy and removal of L3 tumor completed on 08/06/2018.  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   Zytiga 1000 mg daily started in September 2019.  Therapy discontinued in May 2020 for progression of disease.  He is status post stereotactic radiosurgery to the L4 vertebral body  completed on May 10, 2019.  Current therapy:  [name] chemotherapy 20 mg per metered square cycle 1 given on May 10, 2019.    Androgen deprivation under the care of Dr. [name].   Xgeva 120 mg every 6 weeks.  Last injection given on 08/23/2019.  Palliative radiation therapy to the lumbar spine he is to receive 30 Gy in 10 fractions to be completed by 09/20/2019.  Interim History: [name] returns today for a repeat evaluation.  Since last visit, he reports no major changes in his health.  He remains limited in his ambulation using a cane and a chair and has been spending more time in the chair for the most part.  He denies any worsening pain at this time and has tolerated palliative radiation therapy to the spine without any issues.  He continues to have hoarseness and feels the need to cough although no nonproductive cough has been noted.  Denies any shortness of breath or difficulty breathing.  He denies any chest pain or palpitation.  His appetite has been marginal and has lost some more weight.   He denied headaches, blurry vision, syncope or seizures.  Denies any fevers, chills or sweats.  Denied chest pain, palpitation, orthopnea or leg edema.  Denied cough, wheezing or hemoptysis.  Denied nausea, vomiting or abdominal pain.  Denies any constipation or diarrhea.  Denies any frequency urgency or hesitancy.  Denies any arthralgias or myalgias.  Denies any skin rashes or lesions.  Denies any bleeding or clotting tendency.  Denies any easy bruising.  Denies any hair or nail changes.  Denies  any anxiety or depression.  Remaining review of system is negative.           Medications: Reviewed and unchanged. Current Outpatient Medications  Medication Sig Dispense Refill  . acetaminophen (TYLENOL) 500 MG tablet Take 500 mg by mouth daily. Pt takes 5 days after chemo    . antiseptic oral rinse (BIOTENE) LIQD 15 mLs by Mouth Rinse route as needed for dry mouth.    . Calcium  Carb-Cholecalciferol (CALCIUM 1000 + D PO) Take 30 mLs by mouth 3 (three) times a week. Calcium 1000 mg / Vit D 1000 iu     . cycloSPORINE (RESTASIS) 0.05 % ophthalmic emulsion Place 1 drop into both eyes 2 (two) times daily.    Marland Kitchen docusate sodium (COLACE) 100 MG capsule Take 100 mg by mouth 2 (two) times daily.    Marland Kitchen ezetimibe (ZETIA) 10 MG tablet Take 10 mg by mouth every morning.     Marland Kitchen ibuprofen (ADVIL,MOTRIN) 200 MG tablet Take 200 mg by mouth every 6 (six) hours as needed for moderate pain.    Marland Kitchen leuprolide (LUPRON) 30 MG injection Inject 45 mg into the muscle every 6 (six) months.    . lidocaine-prilocaine (EMLA) cream Apply 1 application topically as needed. 30 g 0  . Loratadine (CLARITIN) 10 MG CAPS Take 10 mg by mouth daily. Patient takes every day for 5 days after chemo treatment.    . megestrol (MEGACE) 400 MG/10ML suspension Take 10 mLs (400 mg total) by mouth 2 (two) times daily. 240 mL 0  . ondansetron (ZOFRAN) 4 MG tablet Take 4 mg by mouth every 8 (eight) hours as needed for nausea or vomiting.    Marland Kitchen oxyCODONE (OXY IR/ROXICODONE) 5 MG immediate release tablet Take 1-2 tablets (5-10 mg total) by mouth every 4 (four) hours as needed for severe pain. 90 tablet 0  . pantoprazole (PROTONIX) 40 MG tablet Take 40 mg by mouth daily after supper.     . polyethylene glycol (MIRALAX / GLYCOLAX) packet Take 17 g by mouth daily as needed for mild constipation. 14 each 0  . predniSONE (DELTASONE) 5 MG tablet TAKE 1 TABLET BY MOUTH 2 TIMES DAILY WITH A MEAL. (Patient taking differently: Take 5 mg by mouth 2 (two) times daily with a meal. ) 60 tablet 2  . prochlorperazine (COMPAZINE) 10 MG tablet Take 1 tablet (10 mg total) by mouth every 6 (six) hours as needed for nausea or vomiting. 30 tablet 0  . tamsulosin (FLOMAX) 0.4 MG CAPS capsule Take 0.4 mg by mouth daily.     . traMADol (ULTRAM) 50 MG tablet TAKE 1 TABLET BY MOUTH EVERY 6 HOURS AS NEEDED FOR PAIN 60 tablet 0  . triamcinolone cream (KENALOG)  0.1 % Apply 1 application topically daily as needed (dry skin in the winter).     Alveda Reasons 20 MG TABS tablet TAKE 1 TABLET (20 MG TOTAL) BY MOUTH DAILY WITH SUPPER FOR 30 DAYS. (Patient taking differently: Take 20 mg by mouth daily with supper. ) 90 tablet 1   No current facility-administered medications for this visit.      Allergies:  Allergies  Allergen Reactions  . Statins Other (See Comments)    Muscle pain    Past Medical History, Surgical history, Social history, and Family History updated without any changes.    Physical Exam:  Blood pressure (!) 121/54, pulse 98, temperature 98.5 F (36.9 C), temperature source Oral, resp. rate 18, height 5\' 11"  (1.803 m), weight 174 lb  14.4 oz (79.3 kg), SpO2 100 %.    ECOG: 1   General appearance: Comfortable appearing without any discomfort.  Appeared chronically ill. Head: Normocephalic without any trauma Oropharynx: Mucous membranes are moist and pink without any thrush or ulcers. Eyes: Pupils are equal and round reactive to light. Lymph nodes: No cervical, supraclavicular, inguinal or axillary lymphadenopathy.   Heart:regular rate and rhythm.  S1 and S2 without leg edema. Lung: Clear without any rhonchi or wheezes.  No dullness to percussion. Abdomin: Soft, nontender, nondistended with good bowel sounds.  No hepatosplenomegaly. Musculoskeletal: No joint deformity or effusion.  Full range of motion noted. Neurological: No deficits noted on motor, sensory and deep tendon reflex exam. Skin: No petechial rash or dryness.  Appeared moist.         Results for CALLISTER, KIESCHNICK (MRN [id number]) as of 09/12/2019 07:34  Ref. Range 07/11/2019 08:07 08/01/2019 08:49 08/23/2019 07:56  Prostate Specific Ag, Serum Latest Ref Range: 0.0 - 4.0 ng/mL 103.0 (H) 113.0 (H) 153.0 (H)          Lab Results: Lab Results  Component Value Date   WBC 6.1 08/29/2019   HGB 9.4 (L) 08/29/2019   HCT 30.5 (L) 08/29/2019   MCV 97.1 08/29/2019    PLT 334 08/29/2019     Chemistry      Component Value Date/Time   NA 133 (L) 08/23/2019 0756   K 4.2 08/23/2019 0756   CL 102 08/23/2019 0756   CO2 20 (L) 08/23/2019 0756   BUN 16 08/23/2019 0756   CREATININE 0.75 08/23/2019 0756      Component Value Date/Time   CALCIUM 8.9 08/23/2019 0756   ALKPHOS 74 08/23/2019 0756   AST 22 08/23/2019 0756   ALT 11 08/23/2019 0756   BILITOT 0.4 08/23/2019 07534         79 year old man with:   1.  Advanced prostate cancer that is currently castration resistant with documented hepatic metastasis.    After 5 cycles of [name] chemotherapy he continues to exhibit disease progression.  Tissue biopsy obtained on 08/29/2019 showed prostate adenocarcinoma without any small cell features.  Foundation medicine testing has been ordered at this time.  The natural course of this disease was reviewed today and treatment options were discussed.  Therapy escalation with adding carboplatin to [name] chemotherapy was reviewed.  Potential complications were reiterated today.  This would include further myelosuppression, neutropenia and sepsis, fatigue and tiredness.  Doing this in combination with radiation and increased toxicities but without intervention at this time, his disease is rapidly progressing and likely will result in further decline.  We also discussed the role of supportive care and hospice if he continues to decline and if therapy is ineffective or intolerable.  He is already getting more and more debilitated and likely will require supportive care and hospice in the near future.  After discussion today he is agreeable to proceed with chemotherapy utilizing [name] and carboplatin and will repeat in 3 weeks.  If he does not tolerate this combination then consideration for hospice will be made.   2.  Spinal metastasis: He is getting radiation therapy to the spine without any complications.  3.  Bone directed therapy: Next Delton See will be  given in October 2020.   4.  Androgen deprivation: He continues to receive that under the care of Dr. [name].  5.  Pain: Manageable with oxycodone at this time.  This is related to metastatic disease to bone.  6.  Antiemetics: Antiemetics  are available to him at this time.  7.  IV access: Port-A-Cath remains in use without any issues.  8.  Growth factor support: He will continue to receive growth factor support after each cycle of therapy given his risk of neutropenia and sepsis.  9.  Venous thromboembolism: We will continue on Xarelto with full dose anticoagulation.  No thrombosis or bleeding noted.  10.  Prognosis and goals of care: This was discussed today in detail with the patient and his wife.  He has a poor prognosis overall with declining performance status.  This is a last attempt to palliate his disease with systemic chemotherapy.  If this does not work he understands that hospice is the next step.  11.  Hoarseness: This could be related to lymphadenopathy in the neck and thoracic area.  I will discuss with Dr. Tammi Klippel regarding further radiation to his cervical or thoracic adenopathy may be helpful.  12.  Follow-up: In 3 weeks for the next cycle of chemotherapy.  40  minutes was spent with the patient face-to-face today.  More than 50% of time was dedicated to reviewing his disease status, treatment options, complication related therapy, discussing prognosis and options of treatment.  We also discussed management options for complications related to his therapy.   Zola Button, MD 9/24/20208:26 AM

## 2019-09-12 NOTE — Progress Notes (Addendum)
[name] (AUC 4) added to [name] treatment to begin today. Antiemetics adjusted to Emend + Dex 10mg  + Aloxi for today and future treatments (only one remaining cycle in the plan).  [name] will continue at 20mg /m2.  Pt will be receiving Eligard 30mg  here at [facility]. Clarified with MD.   Patient will be receiving XRT tomorrow, 9/25. Neulasta Onpro order changed to Neulasta injection. RN to schedule injection appt for 9/25.  B. Corey Skains, PharmD, BCPS, BCOP

## 2019-09-12 NOTE — Progress Notes (Deleted)
Patient will be receiving XRT tomorrow, 9/25. Neulasta Onpro order changed to Neulasta injection. RN to schedule injection appt for 9/25.  B. Corey Skains, PharmD, BCPS, BCOP

## 2019-09-12 NOTE — Patient Instructions (Addendum)
[facility] Discharge Instructions for Patients Receiving Chemotherapy  Today you received the following chemotherapy agents: [name]/Carboplatin  To help prevent nausea and vomiting after your treatment, we encourage you to take your nausea medication as directed.   If you develop nausea and vomiting that is not controlled by your nausea medication, call the clinic.   BELOW ARE SYMPTOMS THAT SHOULD BE REPORTED IMMEDIATELY:  *FEVER GREATER THAN 100.5 F  *CHILLS WITH OR WITHOUT FEVER  NAUSEA AND VOMITING THAT IS NOT CONTROLLED WITH YOUR NAUSEA MEDICATION  *UNUSUAL SHORTNESS OF BREATH  *UNUSUAL BRUISING OR BLEEDING  TENDERNESS IN MOUTH AND THROAT WITH OR WITHOUT PRESENCE OF ULCERS  *URINARY PROBLEMS  *BOWEL PROBLEMS  UNUSUAL RASH Items with * indicate a potential emergency and should be followed up as soon as possible.  Feel free to call the clinic should you have any questions or concerns. The clinic phone number is [phone].  Please show the [facility] at check-in to the Emergency Department and triage nurse.  Carboplatin injection What is this medicine? CARBOPLATIN (KAR boe pla tin) is a chemotherapy drug. It targets fast dividing cells, like cancer cells, and causes these cells to die. This medicine is used to treat ovarian cancer and many other cancers. This medicine may be used for other purposes; ask your health care provider or pharmacist if you have questions. COMMON BRAND NAME(S): Paraplatin What should I tell my health care provider before I take this medicine? They need to know if you have any of these conditions:  blood disorders  hearing problems  kidney disease  recent or ongoing radiation therapy  an unusual or allergic reaction to carboplatin, cisplatin, other chemotherapy, other medicines, foods, dyes, or preservatives  pregnant or trying to get pregnant  breast-feeding How should I use this medicine? This drug is  usually given as an infusion into a vein. It is administered in a hospital or clinic by a specially trained health care professional. Talk to your pediatrician regarding the use of this medicine in children. Special care may be needed. Overdosage: If you think you have taken too much of this medicine contact a poison control center or emergency room at once. NOTE: This medicine is only for you. Do not share this medicine with others. What if I miss a dose? It is important not to miss a dose. Call your doctor or health care professional if you are unable to keep an appointment. What may interact with this medicine?  medicines for seizures  medicines to increase blood counts like filgrastim, pegfilgrastim, sargramostim  some antibiotics like amikacin, gentamicin, neomycin, streptomycin, tobramycin  vaccines Talk to your doctor or health care professional before taking any of these medicines:  acetaminophen  aspirin  ibuprofen  ketoprofen  naproxen This list may not describe all possible interactions. Give your health care provider a list of all the medicines, herbs, non-prescription drugs, or dietary supplements you use. Also tell them if you smoke, drink alcohol, or use illegal drugs. Some items may interact with your medicine. What should I watch for while using this medicine? Your condition will be monitored carefully while you are receiving this medicine. You will need important blood work done while you are taking this medicine. This drug may make you feel generally unwell. This is not uncommon, as chemotherapy can affect healthy cells as well as cancer cells. Report any side effects. Continue your course of treatment even though you feel ill unless your doctor tells you to stop.  In some cases, you may be given additional medicines to help with side effects. Follow all directions for their use. Call your doctor or health care professional for advice if you get a fever, chills or  sore throat, or other symptoms of a cold or flu. Do not treat yourself. This drug decreases your body's ability to fight infections. Try to avoid being around people who are sick. This medicine may increase your risk to bruise or bleed. Call your doctor or health care professional if you notice any unusual bleeding. Be careful brushing and flossing your teeth or using a toothpick because you may get an infection or bleed more easily. If you have any dental work done, tell your dentist you are receiving this medicine. Avoid taking products that contain aspirin, acetaminophen, ibuprofen, naproxen, or ketoprofen unless instructed by your doctor. These medicines may hide a fever. Do not become pregnant while taking this medicine. Women should inform their doctor if they wish to become pregnant or think they might be pregnant. There is a potential for serious side effects to an unborn child. Talk to your health care professional or pharmacist for more information. Do not breast-feed an infant while taking this medicine. What side effects may I notice from receiving this medicine? Side effects that you should report to your doctor or health care professional as soon as possible:  allergic reactions like skin rash, itching or hives, swelling of the face, lips, or tongue  signs of infection - fever or chills, cough, sore throat, pain or difficulty passing urine  signs of decreased platelets or bleeding - bruising, pinpoint red spots on the skin, black, tarry stools, nosebleeds  signs of decreased red blood cells - unusually weak or tired, fainting spells, lightheadedness  breathing problems  changes in hearing  changes in vision  chest pain  high blood pressure  low blood counts - This drug may decrease the number of white blood cells, red blood cells and platelets. You may be at increased risk for infections and bleeding.  nausea and vomiting  pain, swelling, redness or irritation at the  injection site  pain, tingling, numbness in the hands or feet  problems with balance, talking, walking  trouble passing urine or change in the amount of urine Side effects that usually do not require medical attention (report to your doctor or health care professional if they continue or are bothersome):  hair loss  loss of appetite  metallic taste in the mouth or changes in taste This list may not describe all possible side effects. Call your doctor for medical advice about side effects. You may report side effects to FDA at 1-800-FDA-1088. Where should I keep my medicine? This drug is given in a hospital or clinic and will not be stored at home. NOTE: This sheet is a summary. It may not cover all possible information. If you have questions about this medicine, talk to your doctor, pharmacist, or health care provider.  2020 Elsevier/Gold Standard (2008-03-11 14:38:05) Leuprolide depot injection What is this medicine? LEUPROLIDE (loo PROE lide) is a man-made protein that acts like a natural hormone in the body. It decreases testosterone in men and decreases estrogen in women. In men, this medicine is used to treat advanced prostate cancer. In women, some forms of this medicine may be used to treat endometriosis, uterine fibroids, or other male hormone-related problems. This medicine may be used for other purposes; ask your health care provider or pharmacist if you have questions. COMMON BRAND NAME(S):  Eligard, Fensolv, Lupron Depot, Lupron Depot-Ped, Viadur What should I tell my health care provider before I take this medicine? They need to know if you have any of these conditions:  diabetes  heart disease or previous heart attack  high blood pressure  high cholesterol  mental illness  osteoporosis  pain or difficulty passing urine  seizures  spinal cord metastasis  stroke  suicidal thoughts, plans, or attempt; a previous suicide attempt by you or a family  member  tobacco smoker  unusual vaginal bleeding (women)  an unusual or allergic reaction to leuprolide, benzyl alcohol, other medicines, foods, dyes, or preservatives  pregnant or trying to get pregnant  breast-feeding How should I use this medicine? This medicine is for injection into a muscle or for injection under the skin. It is given by a health care professional in a hospital or clinic setting. The specific product will determine how it will be given to you. Make sure you understand which product you receive and how often you will receive it. Talk to your pediatrician regarding the use of this medicine in children. Special care may be needed. Overdosage: If you think you have taken too much of this medicine contact a poison control center or emergency room at once. NOTE: This medicine is only for you. Do not share this medicine with others. What if I miss a dose? It is important not to miss a dose. Call your doctor or health care professional if you are unable to keep an appointment. Depot injections: Depot injections are given either once-monthly, every 12 weeks, every 16 weeks, or every 24 weeks depending on the product you are prescribed. The product you are prescribed will be based on if you are male or male, and your condition. Make sure you understand your product and dosing. What may interact with this medicine? Do not take this medicine with any of the following medications:  chasteberry This medicine may also interact with the following medications:  herbal or dietary supplements, like black cohosh or DHEA  male hormones, like estrogens or progestins and birth control pills, patches, rings, or injections  male hormones, like testosterone This list may not describe all possible interactions. Give your health care provider a list of all the medicines, herbs, non-prescription drugs, or dietary supplements you use. Also tell them if you smoke, drink alcohol, or use  illegal drugs. Some items may interact with your medicine. What should I watch for while using this medicine? Visit your doctor or health care professional for regular checks on your progress. During the first weeks of treatment, your symptoms may get worse, but then will improve as you continue your treatment. You may get hot flashes, increased bone pain, increased difficulty passing urine, or an aggravation of nerve symptoms. Discuss these effects with your doctor or health care professional, some of them may improve with continued use of this medicine. Male patients may experience a menstrual cycle or spotting during the first months of therapy with this medicine. If this continues, contact your doctor or health care professional. This medicine may increase blood sugar. Ask your healthcare provider if changes in diet or medicines are needed if you have diabetes. What side effects may I notice from receiving this medicine? Side effects that you should report to your doctor or health care professional as soon as possible:  allergic reactions like skin rash, itching or hives, swelling of the face, lips, or tongue  breathing problems  chest pain  depression or memory disorders  pain in your legs or groin  pain at site where injected or implanted  seizures  severe headache  signs and symptoms of high blood sugar such as being more thirsty or hungry or having to urinate more than normal. You may also feel very tired or have blurry vision  swelling of the feet and legs  suicidal thoughts or other mood changes  visual changes  vomiting Side effects that usually do not require medical attention (report to your doctor or health care professional if they continue or are bothersome):  breast swelling or tenderness  decrease in sex drive or performance  diarrhea  hot flashes  loss of appetite  muscle, joint, or bone pains  nausea  redness or irritation at site where injected  or implanted  skin problems or acne This list may not describe all possible side effects. Call your doctor for medical advice about side effects. You may report side effects to FDA at 1-800-FDA-1088. Where should I keep my medicine? This drug is given in a hospital or clinic and will not be stored at home. NOTE: This sheet is a summary. It may not cover all possible information. If you have questions about this medicine, talk to your doctor, pharmacist, or health care provider.  2020 Elsevier/Gold Standard (2018-10-04 09:27:03)

## 2019-09-13 ENCOUNTER — Telehealth: Payer: Self-pay | Admitting: Oncology

## 2019-09-13 ENCOUNTER — Other Ambulatory Visit: Payer: Self-pay

## 2019-09-13 ENCOUNTER — Inpatient Hospital Stay: Payer: Medicare Other

## 2019-09-13 ENCOUNTER — Telehealth: Payer: Self-pay

## 2019-09-13 ENCOUNTER — Ambulatory Visit
Admission: RE | Admit: 2019-09-13 | Discharge: 2019-09-13 | Disposition: A | Discharge status: Home / Self Care | Payer: Medicare Other | Source: Ambulatory Visit | Attending: Radiation Oncology | Admitting: Radiation Oncology

## 2019-09-13 ENCOUNTER — Ambulatory Visit: Payer: Medicare Other

## 2019-09-13 VITALS — BP 105/54 | Temp 98.9°F | Resp 18

## 2019-09-13 DIAGNOSIS — C61 Malignant neoplasm of prostate: Secondary | ICD-10-CM

## 2019-09-13 DIAGNOSIS — C7951 Secondary malignant neoplasm of bone: Secondary | ICD-10-CM | POA: Diagnosis not present

## 2019-09-13 DIAGNOSIS — G893 Neoplasm related pain (acute) (chronic): Secondary | ICD-10-CM | POA: Diagnosis not present

## 2019-09-13 DIAGNOSIS — Z5111 Encounter for antineoplastic chemotherapy: Secondary | ICD-10-CM | POA: Diagnosis not present

## 2019-09-13 DIAGNOSIS — C778 Secondary and unspecified malignant neoplasm of lymph nodes of multiple regions: Secondary | ICD-10-CM | POA: Diagnosis not present

## 2019-09-13 DIAGNOSIS — C787 Secondary malignant neoplasm of liver and intrahepatic bile duct: Secondary | ICD-10-CM | POA: Diagnosis not present

## 2019-09-13 DIAGNOSIS — Z510 Encounter for antineoplastic radiation therapy: Secondary | ICD-10-CM | POA: Diagnosis not present

## 2019-09-13 DIAGNOSIS — Z192 Hormone resistant malignancy status: Secondary | ICD-10-CM | POA: Diagnosis not present

## 2019-09-13 LAB — PROSTATE-SPECIFIC AG, SERUM (LABCORP): Prostate Specific Ag, Serum: 135 ng/mL — ABNORMAL HIGH (ref 0.0–4.0)

## 2019-09-13 MED ORDER — PEGFILGRASTIM INJECTION 6 MG/0.6ML ~~LOC~~
PREFILLED_SYRINGE | SUBCUTANEOUS | Status: AC
  Filled 2019-09-13: qty 0.6

## 2019-09-13 MED ORDER — PEGFILGRASTIM INJECTION 6 MG/0.6ML ~~LOC~~
6.0000 mg | PREFILLED_SYRINGE | Freq: Once | SUBCUTANEOUS | Status: AC
  Administered 2019-09-13: 16:00:00 6 mg via SUBCUTANEOUS

## 2019-09-13 NOTE — Telephone Encounter (Signed)
Called and left msg. Mailed printout  °

## 2019-09-13 NOTE — Telephone Encounter (Signed)
-----   Message from [name], MD sent at 09/13/2019  8:15 AM EDT ----- Please let him know his PSA is down.

## 2019-09-13 NOTE — Patient Instructions (Signed)

## 2019-09-13 NOTE — Telephone Encounter (Signed)
Contacted patient and made aware of PSA results. 

## 2019-09-13 NOTE — Telephone Encounter (Signed)
Received message from [facility] stating the the test was on hold pending evidence of disease progression since the last [facility] test in 2019. Contacted Tory with [facility] at [phone] and provided information of disease progression from patient 9/24 office visit note. Cornell Barman stated that the verbal information provided was sufficient to now move forward with the requested test.

## 2019-09-16 ENCOUNTER — Other Ambulatory Visit: Payer: Self-pay

## 2019-09-16 ENCOUNTER — Ambulatory Visit
Admission: RE | Admit: 2019-09-16 | Discharge: 2019-09-16 | Disposition: A | Discharge status: Home / Self Care | Payer: Medicare Other | Source: Ambulatory Visit | Attending: Radiation Oncology | Admitting: Radiation Oncology

## 2019-09-16 DIAGNOSIS — D61810 Antineoplastic chemotherapy induced pancytopenia: Secondary | ICD-10-CM | POA: Diagnosis not present

## 2019-09-16 DIAGNOSIS — C61 Malignant neoplasm of prostate: Secondary | ICD-10-CM | POA: Diagnosis not present

## 2019-09-16 DIAGNOSIS — C7949 Secondary malignant neoplasm of other parts of nervous system: Secondary | ICD-10-CM

## 2019-09-16 DIAGNOSIS — Z192 Hormone resistant malignancy status: Secondary | ICD-10-CM | POA: Diagnosis not present

## 2019-09-16 DIAGNOSIS — E860 Dehydration: Secondary | ICD-10-CM | POA: Diagnosis not present

## 2019-09-16 DIAGNOSIS — R6510 Systemic inflammatory response syndrome (SIRS) of non-infectious origin without acute organ dysfunction: Secondary | ICD-10-CM | POA: Diagnosis not present

## 2019-09-16 DIAGNOSIS — Z66 Do not resuscitate: Secondary | ICD-10-CM | POA: Diagnosis not present

## 2019-09-16 DIAGNOSIS — R0600 Dyspnea, unspecified: Secondary | ICD-10-CM | POA: Diagnosis not present

## 2019-09-16 DIAGNOSIS — C7951 Secondary malignant neoplasm of bone: Secondary | ICD-10-CM

## 2019-09-16 DIAGNOSIS — Z20828 Contact with and (suspected) exposure to other viral communicable diseases: Secondary | ICD-10-CM | POA: Diagnosis not present

## 2019-09-16 DIAGNOSIS — R0602 Shortness of breath: Secondary | ICD-10-CM | POA: Diagnosis not present

## 2019-09-17 ENCOUNTER — Ambulatory Visit
Admission: RE | Admit: 2019-09-17 | Discharge: 2019-09-17 | Disposition: A | Discharge status: Home / Self Care | Payer: Medicare Other | Source: Ambulatory Visit | Attending: Radiation Oncology | Admitting: Radiation Oncology

## 2019-09-17 ENCOUNTER — Other Ambulatory Visit: Payer: Self-pay

## 2019-09-17 DIAGNOSIS — C61 Malignant neoplasm of prostate: Secondary | ICD-10-CM | POA: Diagnosis not present

## 2019-09-17 DIAGNOSIS — C7951 Secondary malignant neoplasm of bone: Secondary | ICD-10-CM | POA: Diagnosis not present

## 2019-09-17 DIAGNOSIS — Z192 Hormone resistant malignancy status: Secondary | ICD-10-CM | POA: Diagnosis not present

## 2019-09-18 ENCOUNTER — Ambulatory Visit
Admission: RE | Admit: 2019-09-18 | Discharge: 2019-09-18 | Disposition: A | Discharge status: Home / Self Care | Payer: Medicare Other | Source: Ambulatory Visit | Attending: Radiation Oncology | Admitting: Radiation Oncology

## 2019-09-18 ENCOUNTER — Other Ambulatory Visit: Payer: Self-pay

## 2019-09-18 DIAGNOSIS — C61 Malignant neoplasm of prostate: Secondary | ICD-10-CM | POA: Diagnosis not present

## 2019-09-18 DIAGNOSIS — C7951 Secondary malignant neoplasm of bone: Secondary | ICD-10-CM | POA: Diagnosis not present

## 2019-09-18 DIAGNOSIS — Z192 Hormone resistant malignancy status: Secondary | ICD-10-CM | POA: Diagnosis not present

## 2019-09-19 ENCOUNTER — Encounter: Payer: Self-pay | Admitting: Radiation Oncology

## 2019-09-19 ENCOUNTER — Ambulatory Visit
Admission: RE | Admit: 2019-09-19 | Discharge: 2019-09-19 | Disposition: A | Discharge status: Home / Self Care | Payer: Medicare Other | Source: Ambulatory Visit | Attending: Radiation Oncology | Admitting: Radiation Oncology

## 2019-09-19 ENCOUNTER — Encounter (HOSPITAL_COMMUNITY): Payer: Self-pay | Admitting: Emergency Medicine

## 2019-09-19 ENCOUNTER — Inpatient Hospital Stay (HOSPITAL_COMMUNITY)
Admission: EM | Admit: 2019-09-19 | Discharge: 2019-09-24 | DRG: 640 | Disposition: A | Discharge status: Hospice | Payer: Medicare Other | Attending: Internal Medicine | Admitting: Internal Medicine

## 2019-09-19 ENCOUNTER — Other Ambulatory Visit: Payer: Self-pay

## 2019-09-19 ENCOUNTER — Emergency Department (HOSPITAL_COMMUNITY): Payer: Medicare Other

## 2019-09-19 DIAGNOSIS — K521 Toxic gastroenteritis and colitis: Secondary | ICD-10-CM | POA: Diagnosis present

## 2019-09-19 DIAGNOSIS — Z86711 Personal history of pulmonary embolism: Secondary | ICD-10-CM

## 2019-09-19 DIAGNOSIS — Z86718 Personal history of other venous thrombosis and embolism: Secondary | ICD-10-CM

## 2019-09-19 DIAGNOSIS — Z96643 Presence of artificial hip joint, bilateral: Secondary | ICD-10-CM | POA: Diagnosis present

## 2019-09-19 DIAGNOSIS — Z7952 Long term (current) use of systemic steroids: Secondary | ICD-10-CM

## 2019-09-19 DIAGNOSIS — D61810 Antineoplastic chemotherapy induced pancytopenia: Secondary | ICD-10-CM | POA: Diagnosis present

## 2019-09-19 DIAGNOSIS — Z923 Personal history of irradiation: Secondary | ICD-10-CM

## 2019-09-19 DIAGNOSIS — Z515 Encounter for palliative care: Secondary | ICD-10-CM | POA: Diagnosis present

## 2019-09-19 DIAGNOSIS — Z79899 Other long term (current) drug therapy: Secondary | ICD-10-CM

## 2019-09-19 DIAGNOSIS — Z7901 Long term (current) use of anticoagulants: Secondary | ICD-10-CM

## 2019-09-19 DIAGNOSIS — E785 Hyperlipidemia, unspecified: Secondary | ICD-10-CM | POA: Diagnosis present

## 2019-09-19 DIAGNOSIS — Z510 Encounter for antineoplastic radiation therapy: Secondary | ICD-10-CM | POA: Insufficient documentation

## 2019-09-19 DIAGNOSIS — C61 Malignant neoplasm of prostate: Secondary | ICD-10-CM | POA: Insufficient documentation

## 2019-09-19 DIAGNOSIS — R000 Tachycardia, unspecified: Secondary | ICD-10-CM | POA: Diagnosis present

## 2019-09-19 DIAGNOSIS — R6510 Systemic inflammatory response syndrome (SIRS) of non-infectious origin without acute organ dysfunction: Secondary | ICD-10-CM | POA: Diagnosis present

## 2019-09-19 DIAGNOSIS — C7951 Secondary malignant neoplasm of bone: Secondary | ICD-10-CM | POA: Diagnosis present

## 2019-09-19 DIAGNOSIS — Z192 Hormone resistant malignancy status: Secondary | ICD-10-CM | POA: Diagnosis not present

## 2019-09-19 DIAGNOSIS — Z8042 Family history of malignant neoplasm of prostate: Secondary | ICD-10-CM

## 2019-09-19 DIAGNOSIS — Z66 Do not resuscitate: Secondary | ICD-10-CM | POA: Diagnosis present

## 2019-09-19 DIAGNOSIS — R5381 Other malaise: Secondary | ICD-10-CM | POA: Diagnosis present

## 2019-09-19 DIAGNOSIS — R0602 Shortness of breath: Secondary | ICD-10-CM | POA: Diagnosis not present

## 2019-09-19 DIAGNOSIS — D696 Thrombocytopenia, unspecified: Secondary | ICD-10-CM | POA: Diagnosis present

## 2019-09-19 DIAGNOSIS — Z20828 Contact with and (suspected) exposure to other viral communicable diseases: Secondary | ICD-10-CM | POA: Diagnosis present

## 2019-09-19 DIAGNOSIS — D709 Neutropenia, unspecified: Secondary | ICD-10-CM | POA: Diagnosis present

## 2019-09-19 DIAGNOSIS — K219 Gastro-esophageal reflux disease without esophagitis: Secondary | ICD-10-CM | POA: Diagnosis present

## 2019-09-19 DIAGNOSIS — Z87891 Personal history of nicotine dependence: Secondary | ICD-10-CM

## 2019-09-19 DIAGNOSIS — E860 Dehydration: Principal | ICD-10-CM | POA: Diagnosis present

## 2019-09-19 DIAGNOSIS — R0600 Dyspnea, unspecified: Secondary | ICD-10-CM

## 2019-09-19 DIAGNOSIS — T451X5A Adverse effect of antineoplastic and immunosuppressive drugs, initial encounter: Secondary | ICD-10-CM | POA: Diagnosis present

## 2019-09-19 DIAGNOSIS — R531 Weakness: Secondary | ICD-10-CM

## 2019-09-19 LAB — COMPREHENSIVE METABOLIC PANEL
ALT: 15 U/L (ref 0–44)
AST: 24 U/L (ref 15–41)
Albumin: 2.7 g/dL — ABNORMAL LOW (ref 3.5–5.0)
Alkaline Phosphatase: 59 U/L (ref 38–126)
Anion gap: 9 (ref 5–15)
BUN: 14 mg/dL (ref 8–23)
CO2: 18 mmol/L — ABNORMAL LOW (ref 22–32)
Calcium: 7.9 mg/dL — ABNORMAL LOW (ref 8.9–10.3)
Chloride: 106 mmol/L (ref 98–111)
Creatinine, Ser: 0.77 mg/dL (ref 0.61–1.24)
GFR calc Af Amer: >60 mL/min (ref >60)
GFR calc non Af Amer: >60 mL/min (ref >60)
Glucose, Bld: 116 mg/dL — ABNORMAL HIGH (ref 70–99)
Potassium: 3.9 mmol/L (ref 3.5–5.1)
Sodium: 133 mmol/L — ABNORMAL LOW (ref 135–145)
Total Bilirubin: 0.9 mg/dL (ref 0.3–1.2)
Total Protein: 5.1 g/dL — ABNORMAL LOW (ref 6.5–8.1)

## 2019-09-19 LAB — URINALYSIS, ROUTINE W REFLEX MICROSCOPIC
Glucose, UA: NEGATIVE mg/dL
Hgb urine dipstick: NEGATIVE
Ketones, ur: 20 mg/dL — AB
Leukocytes,Ua: NEGATIVE
Nitrite: NEGATIVE
Protein, ur: 100 mg/dL — AB
Specific Gravity, Urine: 1.027 (ref 1.005–1.030)
pH: 5 (ref 5.0–8.0)

## 2019-09-19 LAB — TROPONIN I (HIGH SENSITIVITY): Troponin I (High Sensitivity): 6 ng/L (ref <18)

## 2019-09-19 NOTE — ED Triage Notes (Signed)
Patient presents with sob that began today. 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 Denies any O2 use at home. Denies pain, fever, cough or chills. 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  A/ox4, resp e/u, nad.

## 2019-09-19 NOTE — ED Provider Notes (Signed)
  Physical Exam  BP (!) 121/93   Pulse (!) 126   Temp 98.6 F (37 C) (Oral)   Resp 17   SpO2 100%   Physical Exam  ED Course/Procedures     Procedures  MDM   Assuming care of patient from Dr. Francia Greaves.   Patient in the ED for DIB. Has hx of PE and is on Xarelto. Workup thus far shows no acute changes on CXR.  Concerning findings are as following - none. Pt has hx of metastatic prostate CA on chemo and radiation. Important pending results are CT-PE and labs. Hx is not suggestive of any underlying infection.  According to Dr. Francia Greaves, plan is to f/u on results and reassess. Dispo per findings.         Varney Biles, MD 09/19/19 [id number]

## 2019-09-19 NOTE — ED Provider Notes (Signed)
[facility] EMERGENCY DEPARTMENT Provider Note   CSN: [id number] Arrival date & time: 09/19/19  2138     History   Chief Complaint Chief Complaint  Patient presents with   Shortness of Breath    HPI [name] is a 78 y.o. male.     78 year old male with prior medical history as detailed below presents for evaluation of shortness of breath.  Patient is currently under going chemo and radiation for treatment of metastatic prostate cancer.  Patient reports gradually worsening shortness of breath over the last 4 to 5 days.  He denies associated fever.  He reports a chronic cough that has not worsened.  He denies known exposure to cope with positive patient.  He reports prior history of PE.  He is reportedly compliant with Xarelto.  The history is provided by the patient, medical records and a relative.  Shortness of Breath Severity:  Moderate Onset quality:  Gradual Duration:  4 days Timing:  Constant Progression:  Worsening Chronicity:  New Relieved by:  Nothing Worsened by:  Nothing Ineffective treatments:  None tried Associated symptoms: no chest pain and no fever     Past Medical History:  Diagnosis Date   Arthritis    Barrett's esophagus    Bone cancer ([place])    Cataract    GERD (gastroesophageal reflux disease)    takes Protonix daily   History of colon polyps    Hyperlipidemia    takes Zetia and Welchol daily   Joint pain    Muscle spasm    takes Robaxin daily as needed   Prostate cancer ([place]) dx'd 10/27/2014   multiple recurrences with bone mets 07/13/15 and 06/24/18    Patient Active Problem List   Diagnosis Date Noted   Port-A-Cath in place 06/20/2019   Acute pulmonary embolism ([place]) 05/17/2019   Goals of care, counseling/discussion 04/25/2019   Pressure injury of skin 10/17/2018   Back pain 10/17/2018   Swelling of lower extremity 10/17/2018   Hyperglycemia 10/17/2018   Left hip pain 08/17/2018   Delirium  08/16/2018   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   Prostate cancer metastatic to multiple sites ([place]) 07/24/2018   Complete tear of right rotator cuff 02/20/2017   Arthritis of right shoulder region 02/20/2017   Pelvic lymphadenopathy 12/10/2015   Barrett esophagus 12/10/2015   Prostate cancer metastatic to bone ([place]) 07/13/2015   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   Family history of malignant neoplasm of prostate 10/27/2014   Elevated prostate specific antigen (PSA) 10/27/2014   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    Past Surgical History:  Procedure Laterality Date   COLONOSCOPY     ESOPHAGOGASTRODUODENOSCOPY     IR IMAGING GUIDED PORT INSERTION  05/01/2019   KNEE ARTHROSCOPY Right 2011   LUMBAR LAMINECTOMY/DECOMPRESSION MICRODISCECTOMY Left 08/06/2018   Procedure: Laminectomy and Foraminotomy - Lumbar Three-Four- left;  Surgeon: Kary Kos, MD;  Location: [place];  Service: Neurosurgery;  Laterality: Left;  left   TOTAL HIP ARTHROPLASTY Left 05/07/2013   Procedure: LEFT TOTAL HIP ARTHROPLASTY ANTERIOR APPROACH;  Surgeon: Mcarthur Rossetti, MD;  Location: [place];  Service: Orthopedics;  Laterality: Left;   TOTAL HIP ARTHROPLASTY Right    TOTAL HIP REVISION Right 02/02/2015   Procedure: TOTAL HIP REVISION;  Surgeon: Kerin Salen, MD;  Location: [place];  Service: Orthopedics;  Laterality: Right;        Home Medications  Prior to Admission medications   Medication Sig Start Date End Date Taking? Authorizing Provider  acetaminophen (TYLENOL) 500 MG tablet Take 500 mg by mouth daily. Pt takes 5 days after chemo    [provider]  antiseptic oral rinse (BIOTENE) LIQD 15 mLs by Mouth Rinse route as needed for dry mouth.    [provider]  Calcium Carb-Cholecalciferol (CALCIUM 1000 + D PO) Take 30 mLs by mouth 3 (three) times a week. Calcium 1000 mg / Vit D 1000 iu     [provider]  cycloSPORINE (RESTASIS) 0.05 % ophthalmic emulsion Place 1 drop into both eyes 2 (two) times daily.    [provider]  docusate sodium (COLACE) 100 MG capsule Take 100 mg by mouth 2 (two) times daily.    [provider]  ezetimibe (ZETIA) 10 MG tablet Take 10 mg by mouth every morning.     [provider]  ibuprofen (ADVIL,MOTRIN) 200 MG tablet Take 200 mg by mouth every 6 (six) hours as needed for moderate pain.    [provider]  leuprolide (LUPRON) 30 MG injection Inject 45 mg into the muscle every 6 (six) months.    [provider]  lidocaine-prilocaine (EMLA) cream Apply 1 application topically as needed. 04/25/19   [name], MD  Loratadine (CLARITIN) 10 MG CAPS Take 10 mg by mouth daily. Patient takes every day for 5 days after chemo treatment.    [provider]  megestrol (MEGACE) 400 MG/10ML suspension Take 10 mLs (400 mg total) by mouth 2 (two) times daily. 08/23/19   [name], MD  ondansetron (ZOFRAN) 4 MG tablet Take 4 mg by mouth every 8 (eight) hours as needed for nausea or vomiting.    [provider]  oxyCODONE (OXY IR/ROXICODONE) 5 MG immediate release tablet Take 1-2 tablets (5-10 mg total) by mouth every 4 (four) hours as needed for severe pain. 08/23/19   [name], MD  pantoprazole (PROTONIX) 40 MG tablet Take 40 mg by mouth daily after supper.     [provider]  polyethylene glycol (MIRALAX / GLYCOLAX) packet Take 17 g by mouth daily as needed for mild constipation. 08/17/18   Geradine Girt, DO  predniSONE (DELTASONE) 5 MG tablet TAKE 1 TABLET BY MOUTH 2 TIMES DAILY WITH A MEAL. Patient taking differently: Take 5 mg by mouth 2 (two) times daily with a meal.  07/15/19   Shadad, Mathis Dad, MD  prochlorperazine (COMPAZINE) 10 MG tablet Take 1 tablet (10 mg total) by mouth every 6 (six) hours as needed for nausea or vomiting. 04/25/19   [name], MD  tamsulosin (FLOMAX) 0.4  MG CAPS capsule Take 0.4 mg by mouth daily.  08/10/16   [provider]  traMADol (ULTRAM) 50 MG tablet TAKE 1 TABLET BY MOUTH EVERY 6 HOURS AS NEEDED FOR PAIN 05/07/19   [name], MD  triamcinolone cream (KENALOG) 0.1 % Apply 1 application topically daily as needed (dry skin in the winter).     [provider]  XARELTO 20 MG TABS tablet TAKE 1 TABLET (20 MG TOTAL) BY MOUTH DAILY WITH SUPPER FOR 30 DAYS. Patient taking differently: Take 20 mg by mouth daily with supper.  08/19/19   [name], MD    Family History Family History  Problem Relation Age of Onset   Colon cancer Mother    Colon cancer Brother    Prostate cancer Brother  Social History Social History   Tobacco Use   Smoking status: Former Smoker    Packs/day: 0.25    Years: 2.00    Pack years: 0.50    Quit date: 12/19/1968    Years since quitting: 50.7   Smokeless tobacco: Former Systems developer   Tobacco comment: quit smoking 38yrs ago  Substance Use Topics   Alcohol use: No   Drug use: No     Allergies   Statins   Review of Systems Review of Systems  Constitutional: Negative for fever.  Respiratory: Positive for shortness of breath.   Cardiovascular: Negative for chest pain.  All other systems reviewed and are negative.    Physical Exam Updated Vital Signs BP 137/90 (BP Location: Left Arm)    Pulse (!) 117    Temp 98.6 F (37 C) (Oral)    Resp (!) 23    SpO2 100%   Physical Exam Vitals signs and nursing note reviewed.  Constitutional:      General: He is not in acute distress.    Appearance: He is well-developed.  HENT:     Head: Normocephalic and atraumatic.  Eyes:     Conjunctiva/sclera: Conjunctivae normal.     Pupils: Pupils are equal, round, and reactive to light.  Neck:     Musculoskeletal: Normal range of motion and neck supple.  Cardiovascular:     Rate and Rhythm: Normal rate and regular rhythm.     Heart sounds: Normal heart sounds.  Pulmonary:      Effort: Pulmonary effort is normal. No respiratory distress.     Breath sounds: Normal breath sounds.  Abdominal:     General: There is no distension.     Palpations: Abdomen is soft.     Tenderness: There is no abdominal tenderness.  Musculoskeletal: Normal range of motion.        General: No deformity.     Comments: Mild edema of distal right calf (right greater than left)   Skin:    General: Skin is warm and dry.  Neurological:     General: No focal deficit present.     Mental Status: He is alert and oriented to person, place, and time.      ED Treatments / Results  Labs (all labs ordered are listed, but only abnormal results are displayed) Labs Reviewed  SARS CORONAVIRUS 2 ([facility])  CULTURE, BLOOD (ROUTINE X 2)  CULTURE, BLOOD (ROUTINE X 2)  COMPREHENSIVE METABOLIC PANEL  CBC WITH DIFFERENTIAL/PLATELET  URINALYSIS, ROUTINE W REFLEX MICROSCOPIC  BRAIN NATRIURETIC PEPTIDE  LACTIC ACID, PLASMA  LACTIC ACID, PLASMA  TROPONIN I (HIGH SENSITIVITY)    EKG None  Radiology Dg Chest 2 View  Result Date: 09/19/2019 CLINICAL DATA:  Prostate cancer on chemo radiation, acute onset shortness of breath EXAM: CHEST - 2 VIEW COMPARISON:  CT 08/20/2019, radiograph 05/17/2019 FINDINGS: Right IJ approach Port-A-Cath tip terminates near the superior cavoatrial junction. Stable bandlike areas of scarring with basilar areas of atelectasis. No consolidation, features of edema, pneumothorax, or effusion. The cardiomediastinal contours are unremarkable. Degenerative changes are present in the imaged spine and shoulders. IMPRESSION: Bandlike areas of stable scarring. No acute cardiopulmonary disease. Electronically Signed   By: Lovena Le M.D.   On: 09/19/2019 22:45    Procedures Procedures (including critical care time)  Medications Ordered in ED Medications - No data to display   Initial Impression / Assessment and Plan / ED Course  I have  reviewed  the triage vital signs and the nursing notes.  Pertinent labs & imaging results that were available during my care of the patient were reviewed by me and considered in my medical decision making (see chart for details).        MDM  Screen complete  [name] was evaluated in Emergency Department on 09/19/2019 for the symptoms described in the history of present illness. He was evaluated in the context of the global COVID-19 pandemic, which necessitated consideration that the patient might be at risk for infection with the SARS-CoV-2 virus that causes COVID-19. Institutional protocols and algorithms that pertain to the evaluation of patients at risk for COVID-19 are in a state of rapid change based on information released by regulatory bodies including the CDC and federal and state organizations. These policies and algorithms were followed during the patient's care in the ED.  Patient is presenting for evaluation of reported dyspnea.  Patient with prior history of PE/DVT.   Will obtain screening labs.  Will obtain CT chest to rule out repeat PE.  Will obtain COVID screen.  Pending labs and studies and disposition signed out to Dr. Kathrynn Humble.    Final Clinical Impressions(s) / ED Diagnoses   Final diagnoses:  Dyspnea, unspecified type    ED Discharge Orders    None       Valarie Merino, MD 09/19/19 2300

## 2019-09-20 ENCOUNTER — Telehealth: Payer: Self-pay | Admitting: Radiation Oncology

## 2019-09-20 ENCOUNTER — Ambulatory Visit: Payer: Medicare Other

## 2019-09-20 ENCOUNTER — Other Ambulatory Visit: Payer: Self-pay

## 2019-09-20 ENCOUNTER — Emergency Department (HOSPITAL_COMMUNITY): Payer: Medicare Other

## 2019-09-20 ENCOUNTER — Encounter (HOSPITAL_COMMUNITY): Payer: Self-pay | Admitting: Radiology

## 2019-09-20 DIAGNOSIS — E860 Dehydration: Secondary | ICD-10-CM | POA: Diagnosis present

## 2019-09-20 DIAGNOSIS — Z96643 Presence of artificial hip joint, bilateral: Secondary | ICD-10-CM | POA: Diagnosis present

## 2019-09-20 DIAGNOSIS — D709 Neutropenia, unspecified: Secondary | ICD-10-CM | POA: Diagnosis present

## 2019-09-20 DIAGNOSIS — Z7401 Bed confinement status: Secondary | ICD-10-CM | POA: Diagnosis not present

## 2019-09-20 DIAGNOSIS — Z79899 Other long term (current) drug therapy: Secondary | ICD-10-CM | POA: Diagnosis not present

## 2019-09-20 DIAGNOSIS — R531 Weakness: Secondary | ICD-10-CM | POA: Diagnosis not present

## 2019-09-20 DIAGNOSIS — R5381 Other malaise: Secondary | ICD-10-CM | POA: Diagnosis present

## 2019-09-20 DIAGNOSIS — R0600 Dyspnea, unspecified: Secondary | ICD-10-CM | POA: Diagnosis not present

## 2019-09-20 DIAGNOSIS — K2270 Barrett's esophagus without dysplasia: Secondary | ICD-10-CM | POA: Diagnosis not present

## 2019-09-20 DIAGNOSIS — J302 Other seasonal allergic rhinitis: Secondary | ICD-10-CM | POA: Diagnosis not present

## 2019-09-20 DIAGNOSIS — C7951 Secondary malignant neoplasm of bone: Secondary | ICD-10-CM | POA: Diagnosis present

## 2019-09-20 DIAGNOSIS — R000 Tachycardia, unspecified: Secondary | ICD-10-CM

## 2019-09-20 DIAGNOSIS — D61818 Other pancytopenia: Secondary | ICD-10-CM | POA: Diagnosis not present

## 2019-09-20 DIAGNOSIS — Z87891 Personal history of nicotine dependence: Secondary | ICD-10-CM | POA: Diagnosis not present

## 2019-09-20 DIAGNOSIS — Z7901 Long term (current) use of anticoagulants: Secondary | ICD-10-CM | POA: Diagnosis not present

## 2019-09-20 DIAGNOSIS — E785 Hyperlipidemia, unspecified: Secondary | ICD-10-CM | POA: Diagnosis present

## 2019-09-20 DIAGNOSIS — D61810 Antineoplastic chemotherapy induced pancytopenia: Secondary | ICD-10-CM | POA: Diagnosis present

## 2019-09-20 DIAGNOSIS — R6510 Systemic inflammatory response syndrome (SIRS) of non-infectious origin without acute organ dysfunction: Secondary | ICD-10-CM | POA: Diagnosis present

## 2019-09-20 DIAGNOSIS — R197 Diarrhea, unspecified: Secondary | ICD-10-CM | POA: Diagnosis not present

## 2019-09-20 DIAGNOSIS — Z86711 Personal history of pulmonary embolism: Secondary | ICD-10-CM | POA: Diagnosis not present

## 2019-09-20 DIAGNOSIS — Z515 Encounter for palliative care: Secondary | ICD-10-CM | POA: Diagnosis present

## 2019-09-20 DIAGNOSIS — R0602 Shortness of breath: Secondary | ICD-10-CM | POA: Diagnosis not present

## 2019-09-20 DIAGNOSIS — Z86718 Personal history of other venous thrombosis and embolism: Secondary | ICD-10-CM | POA: Diagnosis not present

## 2019-09-20 DIAGNOSIS — T451X5A Adverse effect of antineoplastic and immunosuppressive drugs, initial encounter: Secondary | ICD-10-CM

## 2019-09-20 DIAGNOSIS — Z923 Personal history of irradiation: Secondary | ICD-10-CM | POA: Diagnosis not present

## 2019-09-20 DIAGNOSIS — Z8042 Family history of malignant neoplasm of prostate: Secondary | ICD-10-CM | POA: Diagnosis not present

## 2019-09-20 DIAGNOSIS — R630 Anorexia: Secondary | ICD-10-CM | POA: Diagnosis not present

## 2019-09-20 DIAGNOSIS — K521 Toxic gastroenteritis and colitis: Secondary | ICD-10-CM | POA: Diagnosis present

## 2019-09-20 DIAGNOSIS — Z20828 Contact with and (suspected) exposure to other viral communicable diseases: Secondary | ICD-10-CM | POA: Diagnosis present

## 2019-09-20 DIAGNOSIS — Z66 Do not resuscitate: Secondary | ICD-10-CM | POA: Diagnosis present

## 2019-09-20 DIAGNOSIS — C61 Malignant neoplasm of prostate: Secondary | ICD-10-CM | POA: Diagnosis present

## 2019-09-20 DIAGNOSIS — K219 Gastro-esophageal reflux disease without esophagitis: Secondary | ICD-10-CM | POA: Diagnosis present

## 2019-09-20 DIAGNOSIS — D696 Thrombocytopenia, unspecified: Secondary | ICD-10-CM | POA: Diagnosis present

## 2019-09-20 DIAGNOSIS — D701 Agranulocytosis secondary to cancer chemotherapy: Secondary | ICD-10-CM | POA: Diagnosis not present

## 2019-09-20 DIAGNOSIS — Z741 Need for assistance with personal care: Secondary | ICD-10-CM | POA: Diagnosis not present

## 2019-09-20 DIAGNOSIS — M2550 Pain in unspecified joint: Secondary | ICD-10-CM | POA: Diagnosis not present

## 2019-09-20 DIAGNOSIS — D6859 Other primary thrombophilia: Secondary | ICD-10-CM | POA: Diagnosis not present

## 2019-09-20 DIAGNOSIS — Z7952 Long term (current) use of systemic steroids: Secondary | ICD-10-CM | POA: Diagnosis not present

## 2019-09-20 DIAGNOSIS — Z7189 Other specified counseling: Secondary | ICD-10-CM | POA: Diagnosis not present

## 2019-09-20 LAB — CBC WITH DIFFERENTIAL/PLATELET
Abs Immature Granulocytes: 0 10*3/uL (ref 0.00–0.07)
Abs Immature Granulocytes: 0.01 10*3/uL (ref 0.00–0.07)
Basophils Absolute: 0 10*3/uL (ref 0.0–0.1)
Basophils Absolute: 0 10*3/uL (ref 0.0–0.1)
Basophils Relative: 4 %
Basophils Relative: 2 %
Eosinophils Absolute: 0 10*3/uL (ref 0.0–0.5)
Eosinophils Absolute: 0 10*3/uL (ref 0.0–0.5)
Eosinophils Relative: 4 %
Eosinophils Relative: 3 %
HCT: 29.6 % — ABNORMAL LOW (ref 39.0–52.0)
HCT: 24.6 % — ABNORMAL LOW (ref 39.0–52.0)
Hemoglobin: 9.2 g/dL — ABNORMAL LOW (ref 13.0–17.0)
Hemoglobin: 7.8 g/dL — ABNORMAL LOW (ref 13.0–17.0)
Immature Granulocytes: 0 %
Immature Granulocytes: 2 %
Lymphocytes Relative: 16 %
Lymphocytes Relative: 12 %
Lymphs Abs: 0.1 10*3/uL — ABNORMAL LOW (ref 0.7–4.0)
Lymphs Abs: 0.1 10*3/uL — ABNORMAL LOW (ref 0.7–4.0)
MCH: 29.2 pg (ref 26.0–34.0)
MCH: 29.4 pg (ref 26.0–34.0)
MCHC: 31.1 g/dL (ref 30.0–36.0)
MCHC: 31.7 g/dL (ref 30.0–36.0)
MCV: 94 fL (ref 80.0–100.0)
MCV: 92.8 fL (ref 80.0–100.0)
Monocytes Absolute: 0.2 10*3/uL (ref 0.1–1.0)
Monocytes Absolute: 0.1 10*3/uL (ref 0.1–1.0)
Monocytes Relative: 27 %
Monocytes Relative: 20 %
Neutro Abs: 0.3 10*3/uL — ABNORMAL LOW (ref 1.7–7.7)
Neutro Abs: 0.4 10*3/uL — ABNORMAL LOW (ref 1.7–7.7)
Neutrophils Relative %: 49 %
Neutrophils Relative %: 61 %
Platelets: 128 10*3/uL — ABNORMAL LOW (ref 150–400)
Platelets: 99 10*3/uL — ABNORMAL LOW (ref 150–400)
RBC: 3.15 MIL/uL — ABNORMAL LOW (ref 4.22–5.81)
RBC: 2.65 MIL/uL — ABNORMAL LOW (ref 4.22–5.81)
RDW: 18.9 % — ABNORMAL HIGH (ref 11.5–15.5)
RDW: 18.9 % — ABNORMAL HIGH (ref 11.5–15.5)
WBC: 0.6 10*3/uL — CL (ref 4.0–10.5)
WBC: 0.6 10*3/uL — CL (ref 4.0–10.5)
nRBC: 0 % (ref 0.0–0.2)

## 2019-09-20 LAB — COMPREHENSIVE METABOLIC PANEL
ALT: 14 U/L (ref 0–44)
AST: 19 U/L (ref 15–41)
Albumin: 2.3 g/dL — ABNORMAL LOW (ref 3.5–5.0)
Alkaline Phosphatase: 54 U/L (ref 38–126)
Anion gap: 7 (ref 5–15)
BUN: 12 mg/dL (ref 8–23)
CO2: 19 mmol/L — ABNORMAL LOW (ref 22–32)
Calcium: 7.6 mg/dL — ABNORMAL LOW (ref 8.9–10.3)
Chloride: 107 mmol/L (ref 98–111)
Creatinine, Ser: 0.66 mg/dL (ref 0.61–1.24)
GFR calc Af Amer: >60 mL/min (ref >60)
GFR calc non Af Amer: >60 mL/min (ref >60)
Glucose, Bld: 100 mg/dL — ABNORMAL HIGH (ref 70–99)
Potassium: 3.6 mmol/L (ref 3.5–5.1)
Sodium: 133 mmol/L — ABNORMAL LOW (ref 135–145)
Total Bilirubin: 0.7 mg/dL (ref 0.3–1.2)
Total Protein: 4.3 g/dL — ABNORMAL LOW (ref 6.5–8.1)

## 2019-09-20 LAB — TROPONIN I (HIGH SENSITIVITY): Troponin I (High Sensitivity): 6 ng/L (ref <18)

## 2019-09-20 LAB — TYPE AND SCREEN
ABO/RH(D): O POS
Antibody Screen: NEGATIVE

## 2019-09-20 LAB — PATHOLOGIST SMEAR REVIEW

## 2019-09-20 LAB — LACTIC ACID, PLASMA
Lactic Acid, Venous: 1.9 mmol/L (ref 0.5–1.9)
Lactic Acid, Venous: 1.4 mmol/L (ref 0.5–1.9)

## 2019-09-20 LAB — BRAIN NATRIURETIC PEPTIDE: B Natriuretic Peptide: 37.4 pg/mL (ref 0.0–100.0)

## 2019-09-20 LAB — IRON AND TIBC
Iron: 55 ug/dL (ref 45–182)
Saturation Ratios: 29 % (ref 17.9–39.5)
TIBC: 189 ug/dL — ABNORMAL LOW (ref 250–450)
UIBC: 134 ug/dL

## 2019-09-20 LAB — SARS CORONAVIRUS 2 BY RT PCR (HOSPITAL ORDER, PERFORMED IN ~~LOC~~ HOSPITAL LAB): SARS Coronavirus 2: NEGATIVE

## 2019-09-20 LAB — FERRITIN: Ferritin: 468 ng/mL — ABNORMAL HIGH (ref 24–336)

## 2019-09-20 LAB — TSH: TSH: 0.894 u[IU]/mL (ref 0.350–4.500)

## 2019-09-20 LAB — VITAMIN B12: Vitamin B-12: 821 pg/mL (ref 180–914)

## 2019-09-20 LAB — FOLATE: Folate: 8.8 ng/mL (ref >5.9)

## 2019-09-20 MED ORDER — ZOLPIDEM TARTRATE 5 MG PO TABS: 2.5000 mg | ORAL_TABLET | Freq: Every evening | ORAL | Status: DC | PRN

## 2019-09-20 MED ORDER — ACETAMINOPHEN 325 MG PO TABS: 650.0000 mg | ORAL_TABLET | Freq: Four times a day (QID) | ORAL | Status: DC | PRN

## 2019-09-20 MED ORDER — TRAMADOL HCL 50 MG PO TABS: 50.0000 mg | ORAL_TABLET | Freq: Four times a day (QID) | ORAL | Status: DC | PRN

## 2019-09-20 MED ORDER — PANTOPRAZOLE SODIUM 40 MG PO TBEC
40.0000 mg | DELAYED_RELEASE_TABLET | Freq: Every day | ORAL | Status: DC
  Filled 2019-09-20: qty 1

## 2019-09-20 MED ORDER — POLYETHYLENE GLYCOL 3350 17 G PO PACK: 17.0000 g | PACK | Freq: Every day | ORAL | Status: DC | PRN

## 2019-09-20 MED ORDER — CALCIUM CARB-CHOLECALCIFEROL 1000-800 MG-UNIT PO TABS: ORAL_TABLET | ORAL | Status: DC

## 2019-09-20 MED ORDER — DOCUSATE SODIUM 100 MG PO CAPS
100.0000 mg | ORAL_CAPSULE | Freq: Two times a day (BID) | ORAL | Status: DC
  Administered 2019-09-20: 100 mg via ORAL
  Filled 2019-09-20 (×2): qty 1

## 2019-09-20 MED ORDER — OXYCODONE HCL 5 MG PO TABS: 5.0000 mg | ORAL_TABLET | ORAL | Status: DC | PRN

## 2019-09-20 MED ORDER — IOHEXOL 350 MG/ML SOLN
80.0000 mL | Freq: Once | INTRAVENOUS | Status: AC | PRN
  Administered 2019-09-20: 80 mL via INTRAVENOUS

## 2019-09-20 MED ORDER — HYDROCORTISONE NA SUCCINATE PF 100 MG IJ SOLR
50.0000 mg | Freq: Four times a day (QID) | INTRAMUSCULAR | Status: AC
  Administered 2019-09-20 (×2): 50 mg via INTRAVENOUS
  Filled 2019-09-20 (×2): qty 2

## 2019-09-20 MED ORDER — LACTATED RINGERS IV BOLUS
1000.0000 mL | Freq: Once | INTRAVENOUS | Status: AC
  Administered 2019-09-20: 1000 mL via INTRAVENOUS

## 2019-09-20 MED ORDER — CALCIUM CARBONATE-VITAMIN D 500-200 MG-UNIT PO TABS: 1.0000 | ORAL_TABLET | ORAL | Status: DC

## 2019-09-20 MED ORDER — ACETAMINOPHEN 650 MG RE SUPP: 650.0000 mg | Freq: Four times a day (QID) | RECTAL | Status: DC | PRN

## 2019-09-20 MED ORDER — ONDANSETRON HCL 4 MG PO TABS: 4.0000 mg | ORAL_TABLET | Freq: Four times a day (QID) | ORAL | Status: DC | PRN

## 2019-09-20 MED ORDER — LACTATED RINGERS IV SOLN
INTRAVENOUS | Status: AC
  Administered 2019-09-20: 02:00:00 via INTRAVENOUS

## 2019-09-20 MED ORDER — CYCLOSPORINE 0.05 % OP EMUL
1.0000 [drp] | Freq: Two times a day (BID) | OPHTHALMIC | Status: DC
  Administered 2019-09-20 – 2019-09-24 (×9): 1 [drp] via OPHTHALMIC
  Filled 2019-09-20 (×11): qty 30

## 2019-09-20 MED ORDER — PREDNISONE 5 MG PO TABS
5.0000 mg | ORAL_TABLET | Freq: Two times a day (BID) | ORAL | Status: DC
  Administered 2019-09-21 – 2019-09-22 (×3): 5 mg via ORAL
  Filled 2019-09-20 (×5): qty 1

## 2019-09-20 MED ORDER — MEGESTROL ACETATE 400 MG/10ML PO SUSP
400.0000 mg | Freq: Two times a day (BID) | ORAL | Status: DC
  Administered 2019-09-20 – 2019-09-24 (×7): 400 mg via ORAL
  Filled 2019-09-20 (×11): qty 10

## 2019-09-20 MED ORDER — ZOLPIDEM TARTRATE 5 MG PO TABS
2.5000 mg | ORAL_TABLET | Freq: Once | ORAL | Status: AC
  Administered 2019-09-20: 2.5 mg via ORAL
  Filled 2019-09-20: qty 1

## 2019-09-20 MED ORDER — ONDANSETRON HCL 4 MG/2ML IJ SOLN
4.0000 mg | Freq: Four times a day (QID) | INTRAMUSCULAR | Status: DC | PRN
  Administered 2019-09-21 – 2019-09-22 (×3): 4 mg via INTRAVENOUS
  Filled 2019-09-20 (×3): qty 2

## 2019-09-20 MED ORDER — EZETIMIBE 10 MG PO TABS
10.0000 mg | ORAL_TABLET | Freq: Every day | ORAL | Status: DC
  Administered 2019-09-20 – 2019-09-22 (×3): 10 mg via ORAL
  Filled 2019-09-20 (×5): qty 1

## 2019-09-20 MED ORDER — TRAZODONE HCL 50 MG PO TABS
50.0000 mg | ORAL_TABLET | Freq: Once | ORAL | Status: AC
  Administered 2019-09-20: 50 mg via ORAL
  Filled 2019-09-20: qty 1

## 2019-09-20 MED ORDER — TAMSULOSIN HCL 0.4 MG PO CAPS
0.4000 mg | ORAL_CAPSULE | Freq: Every day | ORAL | Status: DC
  Administered 2019-09-20 – 2019-09-24 (×5): 0.4 mg via ORAL
  Filled 2019-09-20 (×6): qty 1

## 2019-09-20 MED ORDER — SODIUM CHLORIDE 0.9 % IV SOLN
INTRAVENOUS | Status: DC
  Administered 2019-09-20 – 2019-09-21 (×3): via INTRAVENOUS

## 2019-09-20 MED ORDER — CHLORHEXIDINE GLUCONATE CLOTH 2 % EX PADS
6.0000 | MEDICATED_PAD | Freq: Every day | CUTANEOUS | Status: DC
  Administered 2019-09-21 – 2019-09-22 (×2): 6 via TOPICAL

## 2019-09-20 MED ORDER — RIVAROXABAN 20 MG PO TABS
20.0000 mg | ORAL_TABLET | Freq: Every day | ORAL | Status: DC
  Administered 2019-09-21 – 2019-09-23 (×2): 20 mg via ORAL
  Filled 2019-09-20 (×4): qty 1

## 2019-09-20 NOTE — ED Notes (Signed)
Followed up with [facility]. All transport are busy at this time. They will call back with update

## 2019-09-20 NOTE — Telephone Encounter (Signed)
Received call from patient's wife, Bethena Roys. She reports that she had to take her husband to [facility] last night for difficulty breathing. Explained to Bethena Roys that the team would resume her husband's treatments on Monday. She verbalized understanding.  Presently the patient is stable waiting in the emergency room at [facility] for a bed to open up at [facility]. Therapist on L1 is aware of this finding. Radiation treatment cancelled for today.

## 2019-09-20 NOTE — H&P (Signed)
History and Physical    [name] [id number] DOB: December 08, 1941 DOA: 09/19/2019  PCP: [name], MD  Patient coming from: Home.  Chief Complaint: Weakness and shortness of breath.  HPI: [name] is a 78 y.o. male with history of metastatic prostate cancer receiving radiation and chemo last chemotherapy was on last week.  Had radiation therapy yesterday and after going home patient started feeling very weak and short of breath.  He was not able to even walk few steps because of the shortness of breath and weakness.  As per patient's wife patient has not been eating well last 1 week.  Has not had any nausea vomiting diarrhea fever or chills.  ED Course: In the ER patient was very tachycardic with heart rate in the 140s.  Afebrile.  Labs showed leukopenia and neutropenia with WBC count of 0.6 and neutrophils 49 platelets 128 hemoglobin 9.2 creatinine was 0.7 AST ALT were normal.  Patient was started on sepsis protocol fluids and blood cultures were obtained but no antibiotics were started since patient was not febrile.  Chest x-ray was unremarkable.  COVID-19 test was negative.  UA shows nothing acute.  Patient admitted for generalized weakness with tachycardia.  Review of Systems: As per HPI, rest all negative.   Past Medical History:  Diagnosis Date  . Arthritis   . Barrett's esophagus   . Bone cancer ([place])   . Cataract   . GERD (gastroesophageal reflux disease)    takes Protonix daily  . History of colon polyps   . Hyperlipidemia    takes Zetia and Welchol daily  . Joint pain   . Muscle spasm    takes Robaxin daily as needed  . Prostate cancer ([place]) dx'd 10/27/2014   multiple recurrences with bone mets 07/13/15 and 06/24/18    Past Surgical History:  Procedure Laterality Date  . COLONOSCOPY    . ESOPHAGOGASTRODUODENOSCOPY    . IR IMAGING GUIDED PORT INSERTION  05/01/2019  . KNEE ARTHROSCOPY Right 2011  . LUMBAR LAMINECTOMY/DECOMPRESSION MICRODISCECTOMY Left  08/06/2018   Procedure: Laminectomy and Foraminotomy - Lumbar Three-Four- left;  Surgeon: Kary Kos, MD;  Location: [facility];  Service: Neurosurgery;  Laterality: Left;  left  . TOTAL HIP ARTHROPLASTY Left 05/07/2013   Procedure: LEFT TOTAL HIP ARTHROPLASTY ANTERIOR APPROACH;  Surgeon: Mcarthur Rossetti, MD;  Location: [facility];  Service: Orthopedics;  Laterality: Left;  . TOTAL HIP ARTHROPLASTY Right   . TOTAL HIP REVISION Right 02/02/2015   Procedure: TOTAL HIP REVISION;  Surgeon: Kerin Salen, MD;  Location: [facility];  Service: Orthopedics;  Laterality: Right;     reports that he quit smoking about 50 years ago. He has a 0.50 pack-year smoking history. He has quit using smokeless tobacco. He reports that he does not drink alcohol or use drugs.  Allergies  Allergen Reactions  . Statins Other (See Comments)    Muscle pain    Family History  Problem Relation Age of Onset  . Colon cancer Mother   . Colon cancer Brother   . Prostate cancer Brother     Prior to Admission medications   Medication Sig Start Date End Date Taking? Authorizing Provider  acetaminophen (TYLENOL) 500 MG tablet Take 500 mg by mouth daily. Pt takes 5 days after chemo   Yes [provider]  antiseptic oral rinse (BIOTENE) LIQD 15 mLs by Mouth Rinse route as needed for dry mouth.   Yes [provider]  Calcium Carb-Cholecalciferol (CALCIUM 1000 +  D PO) Take 30 mLs by mouth 3 (three) times a week. Calcium 1000 mg / Vit D 1000 iu    Yes [provider]  cycloSPORINE (RESTASIS) 0.05 % ophthalmic emulsion Place 1 drop into both eyes 2 (two) times daily.   Yes [provider]  docusate sodium (COLACE) 100 MG capsule Take 100 mg by mouth 2 (two) times daily.   Yes [provider]  ezetimibe (ZETIA) 10 MG tablet Take 10 mg by mouth daily.    Yes [provider]  ibuprofen (ADVIL,MOTRIN) 200 MG tablet Take 200 mg by mouth every 6 (six) hours as needed for moderate pain.   Yes  [provider]  leuprolide, 6 Month, (ELIGARD) 45 MG injection Inject 45 mg into the skin every 6 (six) months. 09/03/19  Yes [provider]  lidocaine-prilocaine (EMLA) cream Apply 1 application topically as needed. 04/25/19  Yes [name], MD  Loratadine (CLARITIN) 10 MG CAPS Take 10 mg by mouth daily. Patient takes every day for 5 days after chemo treatment.   Yes [provider]  megestrol (MEGACE) 400 MG/10ML suspension Take 10 mLs (400 mg total) by mouth 2 (two) times daily. 08/23/19  Yes [name], MD  ondansetron (ZOFRAN) 4 MG tablet Take 4 mg by mouth every 8 (eight) hours as needed for nausea or vomiting.   Yes [provider]  oxyCODONE (OXY IR/ROXICODONE) 5 MG immediate release tablet Take 1-2 tablets (5-10 mg total) by mouth every 4 (four) hours as needed for severe pain. 08/23/19  Yes [name], MD  pantoprazole (PROTONIX) 40 MG tablet Take 40 mg by mouth daily after supper.    Yes [provider]  polyethylene glycol (MIRALAX / GLYCOLAX) packet Take 17 g by mouth daily as needed for mild constipation. 08/17/18  Yes Vann, Jessica U, DO  predniSONE (DELTASONE) 5 MG tablet TAKE 1 TABLET BY MOUTH 2 TIMES DAILY WITH A MEAL. Patient taking differently: Take 5 mg by mouth 2 (two) times daily with a meal.  07/15/19  Yes Shadad, Mathis Dad, MD  prochlorperazine (COMPAZINE) 10 MG tablet Take 1 tablet (10 mg total) by mouth every 6 (six) hours as needed for nausea or vomiting. 04/25/19  Yes [name], MD  tamsulosin (FLOMAX) 0.4 MG CAPS capsule Take 0.4 mg by mouth daily.  08/10/16  Yes [provider]  traMADol (ULTRAM) 50 MG tablet TAKE 1 TABLET BY MOUTH EVERY 6 HOURS AS NEEDED FOR PAIN Patient taking differently: Take 50 mg by mouth every 6 (six) hours as needed for moderate pain.  05/07/19  Yes [name], MD  triamcinolone cream (KENALOG) 0.1 % Apply 1 application topically daily as needed (dry skin in the winter).    Yes  [provider]  XARELTO 20 MG TABS tablet TAKE 1 TABLET (20 MG TOTAL) BY MOUTH DAILY WITH SUPPER FOR 30 DAYS. Patient taking differently: Take 20 mg by mouth daily with supper.  08/19/19  Yes [name], MD    Physical Exam: Constitutional: Moderately built and nourished. Vitals:   09/20/19 0045 09/20/19 0100 09/20/19 0230 09/20/19 0245  BP: 99/66 101/67 (!) 109/95 99/62  Pulse: (!) 118 (!) 117 (!) 107 (!) 105  Resp: (!) 21 14 17 16   Temp:      TempSrc:      SpO2: 99% 96% 99% 98%   Eyes: Anicteric no pallor. ENMT: No discharge from the ears eyes nose or mouth. Neck: No mass felt.  No neck rigidity. Respiratory: No rhonchi or crepitations. Cardiovascular: S1-S2 heard. Abdomen: Soft nontender bowel sounds present. Musculoskeletal: No edema.  No joint effusion. Skin: No rash. Neurologic: Alert awake oriented to time place and person.  Moves all extremities. Psychiatric: Appears normal per normal affect.   Labs on Admission: I have personally reviewed following labs and imaging studies  CBC: Recent Labs  Lab 09/19/19 2205  WBC 0.6*  NEUTROABS 0.3*  HGB 9.2*  HCT 29.6*  MCV 94.0  PLT 0000000*   Basic Metabolic Panel: Recent Labs  Lab 09/19/19 2205  NA 133*  K 3.9  CL 106  CO2 18*  GLUCOSE 116*  BUN 14  CREATININE 0.77  CALCIUM 7.9*   GFR: Estimated Creatinine Clearance: 82.4 mL/min (by C-G formula based on SCr of 0.77 mg/dL). Liver Function Tests: Recent Labs  Lab 09/19/19 2205  AST 24  ALT 15  ALKPHOS 59  BILITOT 0.9  PROT 5.1*  ALBUMIN 2.7*   No results for input(s): LIPASE, AMYLASE in the last 168 hours. No results for input(s): AMMONIA in the last 168 hours. Coagulation Profile: No results for input(s): INR, PROTIME in the last 168 hours. Cardiac Enzymes: No results for input(s): CKTOTAL, CKMB, CKMBINDEX, TROPONINI in the last 168 hours. BNP (last 3 results) No results for input(s): PROBNP in the last 8760 hours. HbA1C: No results  for input(s): HGBA1C in the last 72 hours. CBG: No results for input(s): GLUCAP in the last 168 hours. Lipid Profile: No results for input(s): CHOL, HDL, LDLCALC, TRIG, CHOLHDL, LDLDIRECT in the last 72 hours. Thyroid Function Tests: No results for input(s): TSH, T4TOTAL, FREET4, T3FREE, THYROIDAB in the last 72 hours. Anemia Panel: No results for input(s): VITAMINB12, FOLATE, FERRITIN, TIBC, IRON, RETICCTPCT in the last 72 hours. Urine analysis:    Component Value Date/Time   COLORURINE AMBER (A) 09/19/2019 2200   APPEARANCEUR HAZY (A) 09/19/2019 2200   LABSPEC 1.027 09/19/2019 2200   PHURINE 5.0 09/19/2019 2200   GLUCOSEU NEGATIVE 09/19/2019 2200   HGBUR NEGATIVE 09/19/2019 2200   BILIRUBINUR SMALL (A) 09/19/2019 2200   KETONESUR 20 (A) 09/19/2019 2200   PROTEINUR 100 (A) 09/19/2019 2200   UROBILINOGEN 0.2 01/22/2015 0939   NITRITE NEGATIVE 09/19/2019 2200   LEUKOCYTESUR NEGATIVE 09/19/2019 2200   Sepsis Labs: @LABRCNTIP (procalcitonin:4,lacticidven:4) ) Recent Results (from the past 240 hour(s))  SARS Coronavirus 2 [facility] order, Performed in [facility] hospital lab) Nasopharyngeal Nasopharyngeal Swab     Status: None   Collection Time: 09/19/19 10:50 PM   Specimen: Nasopharyngeal Swab  Result Value Ref Range Status   SARS Coronavirus 2 NEGATIVE NEGATIVE Final    Comment: (NOTE) If result is NEGATIVE SARS-CoV-2 target nucleic acids are NOT DETECTED. The SARS-CoV-2 RNA is generally detectable in upper and lower  respiratory specimens during the acute phase of infection. The lowest  concentration of SARS-CoV-2 viral copies this assay can detect is 250  copies / mL. A negative result does not preclude SARS-CoV-2 infection  and should not be used as the sole basis for treatment or other  patient management decisions.  A negative result may occur with  improper specimen collection / handling, submission of specimen other  than nasopharyngeal swab, presence of viral  mutation(s) within the  areas targeted by this assay, and inadequate number of viral copies  (<250 copies / mL). A negative result must be combined with clinical  observations, patient history, and epidemiological information. If result is POSITIVE SARS-CoV-2 target nucleic acids are DETECTED. The SARS-CoV-2  RNA is generally detectable in upper and lower  respiratory specimens dur ing the acute phase of infection.  Positive  results are indicative of active infection with SARS-CoV-2.  Clinical  correlation with patient history and other diagnostic information is  necessary to determine patient infection status.  Positive results do  not rule out bacterial infection or co-infection with other viruses. If result is PRESUMPTIVE POSTIVE SARS-CoV-2 nucleic acids MAY BE PRESENT.   A presumptive positive result was obtained on the submitted specimen  and confirmed on repeat testing.  While 2019 novel coronavirus  (SARS-CoV-2) nucleic acids may be present in the submitted sample  additional confirmatory testing may be necessary for epidemiological  and / or clinical management purposes  to differentiate between  SARS-CoV-2 and other Sarbecovirus currently known to infect humans.  If clinically indicated additional testing with an alternate test  methodology [phone]) is advised. The SARS-CoV-2 RNA is generally  detectable in upper and lower respiratory sp ecimens during the acute  phase of infection. The expected result is Negative. Fact Sheet for Patients:  StrictlyIdeas.no Fact Sheet for Healthcare Providers: BankingDealers.co.za This test is not yet approved or cleared by the [place] FDA and has been authorized for detection and/or diagnosis of SARS-CoV-2 by FDA under an Emergency Use Authorization (EUA).  This EUA will remain in effect (meaning this test can be used) for the duration of the COVID-19 declaration under Section 564(b)(1)  of the Act, 21 U.S.C. section 360bbb-3(b)(1), unless the authorization is terminated or revoked sooner. Performed at [facility], [place] [address] [place], [place] [address]      Radiological Exams on Admission: Dg Chest 2 View  Result Date: 09/19/2019 CLINICAL DATA:  Prostate cancer on chemo radiation, acute onset shortness of breath EXAM: CHEST - 2 VIEW COMPARISON:  CT 08/20/2019, radiograph 05/17/2019 FINDINGS: Right IJ approach Port-A-Cath tip terminates near the superior cavoatrial junction. Stable bandlike areas of scarring with basilar areas of atelectasis. No consolidation, features of edema, pneumothorax, or effusion. The cardiomediastinal contours are unremarkable. Degenerative changes are present in the imaged spine and shoulders. IMPRESSION: Bandlike areas of stable scarring. No acute cardiopulmonary disease. Electronically Signed   By: Lovena Le M.D.   On: 09/19/2019 22:45   Ct Angio Chest Pe W And/or Wo Contrast  Result Date: 09/20/2019 CLINICAL DATA:  PE suspected shortness of breath for 1 day, metastatic prostate cancer EXAM: CT ANGIOGRAPHY CHEST WITH CONTRAST TECHNIQUE: Multidetector CT imaging of the chest was performed using the standard protocol during bolus administration of intravenous contrast. Multiplanar CT image reconstructions and MIPs were obtained to evaluate the vascular anatomy. CONTRAST:  84mL OMNIPAQUE IOHEXOL 350 MG/ML SOLN COMPARISON:  CT chest, abdomen and pelvis 08/20/2019 FINDINGS: Cardiovascular: Study demonstrates preferential opacification of the thoracic aorta. Opacification of the coronary arteries is suboptimal but sufficient to exclude central and lobar filling defects. No visible pulmonary arterial filling defects are identified. There is mild narrowing of the left and right upper lobar pulmonary arteries by the metastatic hilar nodal masses. The normal caliber thoracic aorta demonstrates atheromatous plaque but no acute luminal irregularity or  periaortic stranding. Common origin of the brachiocephalic and left common carotid artery. Minimal plaque within the proximal great vessels. Cardiac size is within normal limits. Small volume of pericardial fluid is likely within physiologic normal, not significantly increased from prior exam. No frank pericardial effusion. Atherosclerotic calcification of the coronary arteries. Mediastinum/Nodes: Normal appearance of the thyroid gland. Trachea and esophagus are unremarkable. Reference nodes include:  Grossly stable size and appearance of a large left supraclavicular nodal mass measuring 3 x 3.6 cm in size and encasing the left common carotid artery (7/9). Partially necrotic right hilar lymph node narrowing the central pulmonary artery measures 1.9 cm in size, unchanged from prior (7/64). Partially necrotic left hilar lymph node narrowing the left central pulmonary arteries measures 1.0 cm in size also unchanged (7/57). Left paratracheal lymph node measures 11 mm, not significantly changed (7/55). Lungs/Pleura: Small bilateral pleural effusions with adjacent areas of ground-glass opacity likely reflecting atelectatic change. Stable appearance of a subpleural right upper lobe nodule measuring approximately 9 mm in size, not significantly changed (9/37). No new nodules or masses. No consolidative process. No pneumothorax. Upper Abdomen: Markedly nodular hepatic surface contour with multiple ill-defined hypoattenuating lesions throughout the liver parenchyma. Largest lesion in the posterior right lobe liver measures up to 5.2 cm in size but the overall margins of which are less clearly delineated on this phase of contrast enhancement. Partial visualization of a left renal cyst. No acute abnormalities present in the visualized portions of the upper abdomen. Musculoskeletal: Bilateral gynecomastia. Multilevel degenerative changes are present in the imaged portions of the spine. No suspicious osseous lesions. Review of the  MIP images confirms the above findings. IMPRESSION: 1. Suboptimal opacification of the pulmonary arteries. No evidence of central or lobar pulmonary embolism. 2. Grossly stable bilateral hilar and mediastinal adenopathy, consistent with metastatic disease, much of which is unchanged from staging CT 1 month prior. 3. Supraclavicular nodal mass with encasement of the left common carotid artery. 4. New bilateral pleural effusions with adjacent areas of ground-glass opacity likely reflecting atelectatic change. 5. Stable sub solid nodule in the right upper lobe. 6. Markedly nodular hepatic surface contour with multiple ill-defined hypoattenuating lesions throughout the liver parenchyma, consistent with known metastatic disease. 7. Aortic Atherosclerosis (ICD10-I70.0). Electronically Signed   By: Lovena Le M.D.   On: 09/20/2019 00:47    EKG: Independently reviewed.  Sinus tachycardia.  Assessment/Plan Principal Problem:   Weakness generalized Active Problems:   Prostate cancer metastatic to multiple sites [facility])   Sinus tachycardia   Neutropenia ([facility])   Thrombocytopenia ([place])    1. Generalized weakness -likely from poor oral intake.  Will continue to monitor.   2. Pancytopenia with significant neutropenia but afebrile blood culture has been sent.  Closely observe for any fever.  Probably from chemotherapy. 3. Tachycardia -appears to be sinus tach at this time.  Closely monitor.  Check TSH. 4. History of metastatic prostate cancer being followed by Dr. [name].  On chemotherapy which patient received last week and also receives radiation every day.  May need to notify Dr. Tammi Klippel once patient reaches [facility] long hospital.  Will do stress dose steroids since patient takes prednisone every day.   DVT prophylaxis: Lovenox. Code Status: Full code. Family Communication: Patient's wife. Disposition Plan: Home. Consults called: Physical therapy. Admission status: Observation.   Rise Patience  MD [facility] Pager [phone].  If 7PM-7AM, please contact night-coverage [email or web address] Password [email or web address]  09/20/2019, 4:28 AM

## 2019-09-20 NOTE — ED Notes (Signed)
Call Bethena Roys (wife) for updates/questions [phone]

## 2019-09-20 NOTE — ED Notes (Signed)
Patient provided with dinner tray.

## 2019-09-20 NOTE — ED Notes (Signed)
ED TO INPATIENT HANDOFF REPORT  ED Nurse Name and Phone #: Candace Ramus   S Name/Age/Gender [name] 78 y.o. male Room/Bed: 040C/040C  Code Status   Code Status: Full Code  Home/SNF/Other Home Patient oriented to: self, place, time and situation Is this baseline? Yes   Triage Complete: Triage complete  Chief Complaint [facility]  Triage Note Patient presents with sob that began today. 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 Denies any O2 use at home. Denies pain, fever, cough or chills. 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  A/ox4, resp e/u, nad.    Allergies Allergies  Allergen Reactions  . Statins Other (See Comments)    Muscle pain    Level of Care/Admitting Diagnosis ED Disposition    ED Disposition Condition Comment   Admit  Hospital Area: [place] [id number]  Level of Care: Telemetry [5]  Admit to tele based on following criteria: Monitor for Ischemic changes  Covid Evaluation: Asymptomatic Screening Protocol (No Symptoms)  Diagnosis: Weakness generalized [id number]  Admitting Physician: Rise Patience [phone]  Attending Physician: [name]  PT Class (Do Not Modify): Observation [104]  PT Acc Code (Do Not Modify): Observation [10022]       B Medical/Surgery History Past Medical History:  Diagnosis Date  . Arthritis   . Barrett's esophagus   . Bone cancer ([place])   . Cataract   . GERD (gastroesophageal reflux disease)    takes Protonix daily  . History of colon polyps   . Hyperlipidemia    takes Zetia and Welchol daily  . Joint pain   . Muscle spasm    takes Robaxin daily as needed  . Prostate cancer ([place]) dx'd 10/27/2014   multiple recurrences with bone mets 07/13/15 and 06/24/18   Past Surgical History:  Procedure Laterality Date  . COLONOSCOPY    . ESOPHAGOGASTRODUODENOSCOPY    . IR IMAGING  GUIDED PORT INSERTION  05/01/2019  . KNEE ARTHROSCOPY Right 2011  . LUMBAR LAMINECTOMY/DECOMPRESSION MICRODISCECTOMY Left 08/06/2018   Procedure: Laminectomy and Foraminotomy - Lumbar Three-Four- left;  Surgeon: Kary Kos, MD;  Location: [place];  Service: Neurosurgery;  Laterality: Left;  left  . TOTAL HIP ARTHROPLASTY Left 05/07/2013   Procedure: LEFT TOTAL HIP ARTHROPLASTY ANTERIOR APPROACH;  Surgeon: Mcarthur Rossetti, MD;  Location: [place];  Service: Orthopedics;  Laterality: Left;  . TOTAL HIP ARTHROPLASTY Right   . TOTAL HIP REVISION Right 02/02/2015   Procedure: TOTAL HIP REVISION;  Surgeon: Kerin Salen, MD;  Location: [place];  Service: Orthopedics;  Laterality: Right;     A IV Location/Drains/Wounds Patient Lines/Drains/Airways Status   Active Line/Drains/Airways    Name:   Placement date:   Placement time:   Site:   Days:   Implanted Port 05/01/19 Right Chest   05/01/19    -    Chest   142   Wound / Incision (Open or Dehisced) 08/29/19 Puncture Flank Right;Upper   08/29/19    1354    Flank   22          Intake/Output Last 24 hours No intake or output data in the 24 hours ending 09/20/19 1328  Labs/Imaging Results for orders placed or performed during the hospital encounter of 09/19/19 (from the past 48 hour(s))  Urinalysis, Routine w reflex microscopic     Status: Abnormal   Collection Time: 09/19/19 10:00  PM  Result Value Ref Range   Color, Urine AMBER (A) YELLOW    Comment: BIOCHEMICALS MAY BE AFFECTED BY COLOR   APPearance HAZY (A) CLEAR   Specific Gravity, Urine 1.027 1.005 - 1.030   pH 5.0 5.0 - 8.0   Glucose, UA NEGATIVE NEGATIVE mg/dL   Hgb urine dipstick NEGATIVE NEGATIVE   Bilirubin Urine SMALL (A) NEGATIVE   Ketones, ur 20 (A) NEGATIVE mg/dL   Protein, ur 100 (A) NEGATIVE mg/dL   Nitrite NEGATIVE NEGATIVE   Leukocytes,Ua NEGATIVE NEGATIVE   RBC / HPF 0-5 0 - 5 RBC/hpf   WBC, UA 0-5 0 - 5 WBC/hpf   Bacteria, UA RARE (A) NONE SEEN   Squamous Epithelial  / LPF 0-5 0 - 5   Mucus PRESENT    Hyaline Casts, UA PRESENT     Comment: Performed at [facility], [address], [place], [place] [address]  Comprehensive metabolic panel     Status: Abnormal   Collection Time: 09/19/19 10:05 PM  Result Value Ref Range   Sodium 133 (L) 135 - 145 mmol/L   Potassium 3.9 3.5 - 5.1 mmol/L   Chloride 106 98 - 111 mmol/L   CO2 18 (L) 22 - 32 mmol/L   Glucose, Bld 116 (H) 70 - 99 mg/dL   BUN 14 8 - 23 mg/dL   Creatinine, Ser 0.77 0.61 - 1.24 mg/dL   Calcium 7.9 (L) 8.9 - 10.3 mg/dL   Total Protein 5.1 (L) 6.5 - 8.1 g/dL   Albumin 2.7 (L) 3.5 - 5.0 g/dL   AST 24 15 - 41 U/L   ALT 15 0 - 44 U/L   Alkaline Phosphatase 59 38 - 126 U/L   Total Bilirubin 0.9 0.3 - 1.2 mg/dL   GFR calc non Af Amer >60 >60 mL/min   GFR calc Af Amer >60 >60 mL/min   Anion gap 9 5 - 15    Comment: Performed at [facility], [place] [address] [place], [place] [address]  CBC with Differential     Status: Abnormal   Collection Time: 09/19/19 10:05 PM  Result Value Ref Range   WBC 0.6 (LL) 4.0 - 10.5 K/uL    Comment: REPEATED TO VERIFY WHITE COUNT CONFIRMED ON SMEAR THIS CRITICAL RESULT HAS VERIFIED AND BEEN CALLED TO RN OLEARY,A. BY MESSAN [place] ON 10 01 2020 AT 2357, AND HAS BEEN READ BACK.     RBC 3.15 (L) 4.22 - 5.81 MIL/uL   Hemoglobin 9.2 (L) 13.0 - 17.0 g/dL   HCT 29.6 (L) 39.0 - 52.0 %   MCV 94.0 80.0 - 100.0 fL   MCH 29.2 26.0 - 34.0 pg   MCHC 31.1 30.0 - 36.0 g/dL   RDW 18.9 (H) 11.5 - 15.5 %   Platelets 128 (L) 150 - 400 K/uL   nRBC 0.0 0.0 - 0.2 %   Neutrophils Relative % 49 %   Neutro Abs 0.3 (L) 1.7 - 7.7 K/uL   Lymphocytes Relative 16 %   Lymphs Abs 0.1 (L) 0.7 - 4.0 K/uL   Monocytes Relative 27 %   Monocytes Absolute 0.2 0.1 - 1.0 K/uL   Eosinophils Relative 4 %   Eosinophils Absolute 0.0 0.0 - 0.5 K/uL   Basophils Relative 4 %   Basophils Absolute 0.0 0.0 - 0.1 K/uL   Immature Granulocytes 0 %   Abs Immature Granulocytes 0.00 0.00 -  0.07 K/uL   Polychromasia PRESENT     Comment: Performed  at [facility], [place] [address], [place], [place] [address]  Brain natriuretic peptide     Status: None   Collection Time: 09/19/19 10:05 PM  Result Value Ref Range   B Natriuretic Peptide 37.4 0.0 - 100.0 pg/mL    Comment: Performed at [facility] [address] [place], [place] [address]  Troponin I (High Sensitivity)     Status: None   Collection Time: 09/19/19 10:05 PM  Result Value Ref Range   Troponin I (High Sensitivity) 6 <18 ng/L    Comment: (NOTE) Elevated high sensitivity troponin I (hsTnI) values and significant  changes across serial measurements may suggest ACS but many other  chronic and acute conditions are known to elevate hsTnI results.  Refer to the "Links" section for chest pain algorithms and additional  guidance. Performed at [facility], [place] [address] [place], [place] [address]   Culture, blood (routine x 2)     Status: None (Preliminary result)   Collection Time: 09/19/19 10:38 PM   Specimen: BLOOD RIGHT HAND  Result Value Ref Range   Specimen Description BLOOD RIGHT HAND    Special Requests      BOTTLES DRAWN AEROBIC AND ANAEROBIC Blood Culture results may not be optimal due to an inadequate volume of blood received in culture bottles   Culture      NO GROWTH < 12 HOURS Performed at [facility] [address], [place], [place] [address]    Report Status PENDING   SARS Coronavirus 2 [facility] order, Performed in [facility] hospital lab) Nasopharyngeal Nasopharyngeal Swab     Status: None   Collection Time: 09/19/19 10:50 PM   Specimen: Nasopharyngeal Swab  Result Value Ref Range   SARS Coronavirus 2 NEGATIVE NEGATIVE    Comment: (NOTE) If result is NEGATIVE SARS-CoV-2 target nucleic acids are NOT DETECTED. The SARS-CoV-2 RNA is generally detectable in upper and lower  respiratory specimens during the acute phase of infection. The lowest  concentration of  SARS-CoV-2 viral copies this assay can detect is 250  copies / mL. A negative result does not preclude SARS-CoV-2 infection  and should not be used as the sole basis for treatment or other  patient management decisions.  A negative result may occur with  improper specimen collection / handling, submission of specimen other  than nasopharyngeal swab, presence of viral mutation(s) within the  areas targeted by this assay, and inadequate number of viral copies  (<250 copies / mL). A negative result must be combined with clinical  observations, patient history, and epidemiological information. If result is POSITIVE SARS-CoV-2 target nucleic acids are DETECTED. The SARS-CoV-2 RNA is generally detectable in upper and lower  respiratory specimens dur ing the acute phase of infection.  Positive  results are indicative of active infection with SARS-CoV-2.  Clinical  correlation with patient history and other diagnostic information is  necessary to determine patient infection status.  Positive results do  not rule out bacterial infection or co-infection with other viruses. If result is PRESUMPTIVE POSTIVE SARS-CoV-2 nucleic acids MAY BE PRESENT.   A presumptive positive result was obtained on the submitted specimen  and confirmed on repeat testing.  While 2019 novel coronavirus  (SARS-CoV-2) nucleic acids may be present in the submitted sample  additional confirmatory testing may be necessary for epidemiological  and / or clinical management purposes  to differentiate between  SARS-CoV-2 and other Sarbecovirus currently known to infect humans.  If clinically indicated  additional testing with an alternate test  methodology [phone]) is advised. The SARS-CoV-2 RNA is generally  detectable in upper and lower respiratory sp ecimens during the acute  phase of infection. The expected result is Negative. Fact Sheet for Patients:  StrictlyIdeas.no Fact Sheet for Healthcare  Providers: BankingDealers.co.za This test is not yet approved or cleared by the [place] FDA and has been authorized for detection and/or diagnosis of SARS-CoV-2 by FDA under an Emergency Use Authorization (EUA).  This EUA will remain in effect (meaning this test can be used) for the duration of the COVID-19 declaration under Section 564(b)(1) of the Act, 21 U.S.C. section 360bbb-3(b)(1), unless the authorization is terminated or revoked sooner. Performed at [facility], [place] [address], [place], [place] [address]   Lactic acid, plasma     Status: None   Collection Time: 09/19/19 11:05 PM  Result Value Ref Range   Lactic Acid, Venous 1.9 0.5 - 1.9 mmol/L    Comment: Performed at [facility] [address] [place], [place] [address]  Culture, blood (routine x 2)     Status: None (Preliminary result)   Collection Time: 09/19/19 11:22 PM   Specimen: BLOOD  Result Value Ref Range   Specimen Description BLOOD PORTA CATH    Special Requests      BOTTLES DRAWN AEROBIC AND ANAEROBIC Blood Culture adequate volume   Culture      NO GROWTH < 12 HOURS Performed at [facility], [place] [address], [place], [place] [address]    Report Status PENDING   Lactic acid, plasma     Status: None   Collection Time: 09/20/19  3:55 AM  Result Value Ref Range   Lactic Acid, Venous 1.4 0.5 - 1.9 mmol/L    Comment: Performed at [facility] [address] [place], [place] [address]  Troponin I (High Sensitivity)     Status: None   Collection Time: 09/20/19  3:55 AM  Result Value Ref Range   Troponin I (High Sensitivity) 6 <18 ng/L    Comment: (NOTE) Elevated high sensitivity troponin I (hsTnI) values and significant  changes across serial measurements may suggest ACS but many other  chronic and acute conditions are known to elevate hsTnI results.  Refer to the "Links" section for chest pain algorithms and additional  guidance. Performed at [facility], [place] [address], [place], [place] [address]   Comprehensive metabolic panel     Status: Abnormal   Collection Time: 09/20/19  4:55 AM  Result Value Ref Range   Sodium 133 (L) 135 - 145 mmol/L   Potassium 3.6 3.5 - 5.1 mmol/L   Chloride 107 98 - 111 mmol/L   CO2 19 (L) 22 - 32 mmol/L   Glucose, Bld 100 (H) 70 - 99 mg/dL   BUN 12 8 - 23 mg/dL   Creatinine, Ser 0.66 0.61 - 1.24 mg/dL   Calcium 7.6 (L) 8.9 - 10.3 mg/dL   Total Protein 4.3 (L) 6.5 - 8.1 g/dL   Albumin 2.3 (L) 3.5 - 5.0 g/dL   AST 19 15 - 41 U/L   ALT 14 0 - 44 U/L   Alkaline Phosphatase 54 38 - 126 U/L   Total Bilirubin 0.7 0.3 - 1.2 mg/dL   GFR calc non Af Amer >60 >60 mL/min   GFR calc Af Amer >60 >60 mL/min   Anion gap 7 5 - 15    Comment: Performed at [facility], [place] [address] [place], [place] [address]  CBC WITH DIFFERENTIAL     Status: Abnormal   Collection Time: 09/20/19  4:55 AM  Result Value Ref Range   WBC 0.6 (LL) 4.0 - 10.5 K/uL    Comment: REPEATED TO VERIFY CRITICAL VALUE NOTED.  VALUE IS CONSISTENT WITH PREVIOUSLY REPORTED AND CALLED VALUE.    RBC 2.65 (L) 4.22 - 5.81 MIL/uL   Hemoglobin 7.8 (L) 13.0 - 17.0 g/dL    Comment: REPEATED TO VERIFY   HCT 24.6 (L) 39.0 - 52.0 %   MCV 92.8 80.0 - 100.0 fL   MCH 29.4 26.0 - 34.0 pg   MCHC 31.7 30.0 - 36.0 g/dL   RDW 18.9 (H) 11.5 - 15.5 %   Platelets 99 (L) 150 - 400 K/uL    Comment: REPEATED TO VERIFY PLATELET COUNT CONFIRMED BY SMEAR    Neutrophils Relative % 61 %   Neutro Abs 0.4 (L) 1.7 - 7.7 K/uL   Lymphocytes Relative 12 %   Lymphs Abs 0.1 (L) 0.7 - 4.0 K/uL   Monocytes Relative 20 %   Monocytes Absolute 0.1 0.1 - 1.0 K/uL   Eosinophils Relative 3 %   Eosinophils Absolute 0.0 0.0 - 0.5 K/uL   Basophils Relative 2 %   Basophils Absolute 0.0 0.0 - 0.1 K/uL   WBC Morphology ATYPICAL MONONUCLEAR CELLS     Comment: DOHLE BODIES   Immature Granulocytes 2 %   Abs Immature Granulocytes 0.01 0.00 - 0.07 K/uL    Comment:  Performed at [facility], [address], [place], [place] [address]  TSH     Status: None   Collection Time: 09/20/19  4:55 AM  Result Value Ref Range   TSH 0.894 0.350 - 4.500 uIU/mL    Comment: Performed by a 3rd Generation assay with a functional sensitivity of <=0.01 uIU/mL. Performed at [facility], [place] [address] [place], [place] [address]    Dg Chest 2 View  Result Date: 09/19/2019 CLINICAL DATA:  Prostate cancer on chemo radiation, acute onset shortness of breath EXAM: CHEST - 2 VIEW COMPARISON:  CT 08/20/2019, radiograph 05/17/2019 FINDINGS: Right IJ approach Port-A-Cath tip terminates near the superior cavoatrial junction. Stable bandlike areas of scarring with basilar areas of atelectasis. No consolidation, features of edema, pneumothorax, or effusion. The cardiomediastinal contours are unremarkable. Degenerative changes are present in the imaged spine and shoulders. IMPRESSION: Bandlike areas of stable scarring. No acute cardiopulmonary disease. Electronically Signed   By: Lovena Le M.D.   On: 09/19/2019 22:45   Ct Angio Chest Pe W And/or Wo Contrast  Result Date: 09/20/2019 CLINICAL DATA:  PE suspected shortness of breath for 1 day, metastatic prostate cancer EXAM: CT ANGIOGRAPHY CHEST WITH CONTRAST TECHNIQUE: Multidetector CT imaging of the chest was performed using the standard protocol during bolus administration of intravenous contrast. Multiplanar CT image reconstructions and MIPs were obtained to evaluate the vascular anatomy. CONTRAST:  38mL OMNIPAQUE IOHEXOL 350 MG/ML SOLN COMPARISON:  CT chest, abdomen and pelvis 08/20/2019 FINDINGS: Cardiovascular: Study demonstrates preferential opacification of the thoracic aorta. Opacification of the coronary arteries is suboptimal but sufficient to exclude central and lobar filling defects. No visible pulmonary arterial filling defects are identified. There is mild narrowing of the left and right upper lobar pulmonary  arteries by the metastatic hilar nodal masses. The normal caliber thoracic aorta demonstrates atheromatous plaque but no acute luminal irregularity or periaortic stranding. Common origin of the brachiocephalic and left common  carotid artery. Minimal plaque within the proximal great vessels. Cardiac size is within normal limits. Small volume of pericardial fluid is likely within physiologic normal, not significantly increased from prior exam. No frank pericardial effusion. Atherosclerotic calcification of the coronary arteries. Mediastinum/Nodes: Normal appearance of the thyroid gland. Trachea and esophagus are unremarkable. Reference nodes include: Grossly stable size and appearance of a large left supraclavicular nodal mass measuring 3 x 3.6 cm in size and encasing the left common carotid artery (7/9). Partially necrotic right hilar lymph node narrowing the central pulmonary artery measures 1.9 cm in size, unchanged from prior (7/64). Partially necrotic left hilar lymph node narrowing the left central pulmonary arteries measures 1.0 cm in size also unchanged (7/57). Left paratracheal lymph node measures 11 mm, not significantly changed (7/55). Lungs/Pleura: Small bilateral pleural effusions with adjacent areas of ground-glass opacity likely reflecting atelectatic change. Stable appearance of a subpleural right upper lobe nodule measuring approximately 9 mm in size, not significantly changed (9/37). No new nodules or masses. No consolidative process. No pneumothorax. Upper Abdomen: Markedly nodular hepatic surface contour with multiple ill-defined hypoattenuating lesions throughout the liver parenchyma. Largest lesion in the posterior right lobe liver measures up to 5.2 cm in size but the overall margins of which are less clearly delineated on this phase of contrast enhancement. Partial visualization of a left renal cyst. No acute abnormalities present in the visualized portions of the upper abdomen.  Musculoskeletal: Bilateral gynecomastia. Multilevel degenerative changes are present in the imaged portions of the spine. No suspicious osseous lesions. Review of the MIP images confirms the above findings. IMPRESSION: 1. Suboptimal opacification of the pulmonary arteries. No evidence of central or lobar pulmonary embolism. 2. Grossly stable bilateral hilar and mediastinal adenopathy, consistent with metastatic disease, much of which is unchanged from staging CT 1 month prior. 3. Supraclavicular nodal mass with encasement of the left common carotid artery. 4. New bilateral pleural effusions with adjacent areas of ground-glass opacity likely reflecting atelectatic change. 5. Stable sub solid nodule in the right upper lobe. 6. Markedly nodular hepatic surface contour with multiple ill-defined hypoattenuating lesions throughout the liver parenchyma, consistent with known metastatic disease. 7. Aortic Atherosclerosis (ICD10-I70.0). Electronically Signed   By: Lovena Le M.D.   On: 09/20/2019 00:47    Pending Labs Unresulted Labs (From admission, onward)    Start     Ordered   09/21/19 0500  CBC with Differential/Platelet  Daily,   R     09/20/19 1224   09/21/19 [id number]  Basic metabolic panel  Daily,   R     09/20/19 1224   09/20/19 1232  Culture, Urine  Once,   STAT     09/20/19 1231   09/20/19 1149  Vitamin B12  (Anemia Panel (PNL))  Once,   STAT     09/20/19 1148   09/20/19 1149  Folate  (Anemia Panel (PNL))  Once,   STAT     09/20/19 1148   09/20/19 1149  Iron and TIBC  (Anemia Panel (PNL))  Once,   STAT     09/20/19 1148   09/20/19 1149  Ferritin  (Anemia Panel (PNL))  Once,   STAT     09/20/19 1148   09/20/19 1149  Type and screen [place]  Once,   STAT    Comments: [facility]    09/20/19 1148   09/19/19 2205  Pathologist smear review  Once,   R     09/19/19 2205  Unscheduled  Occult blood card to lab, stool RN will collect  As needed,   R     Question:  Specimen to be collected by:  Answer:  RN will collect   09/20/19 1228          Vitals/Pain Today's Vitals   09/20/19 0900 09/20/19 1000 09/20/19 1100 09/20/19 1200  BP: (!) 88/51 105/67 (!) 123/91 135/80  Pulse: 97 (!) 109 (!) 115   Resp: 19 18 (!) 22 20  Temp:      TempSrc:      SpO2: 100% 98% 100% 98%  PainSc:        Isolation Precautions Protective Precautions  Medications Medications  lactated ringers infusion ( Intravenous New Bag/Given 09/20/19 0227)  oxyCODONE (Oxy IR/ROXICODONE) immediate release tablet 5-10 mg (has no administration in time range)  traMADol (ULTRAM) tablet 50 mg (has no administration in time range)  megestrol (MEGACE) 400 MG/10ML suspension 400 mg (400 mg Oral Given 09/20/19 1121)  ezetimibe (ZETIA) tablet 10 mg (10 mg Oral Given 09/20/19 1121)  predniSONE (DELTASONE) tablet 5 mg (has no administration in time range)  docusate sodium (COLACE) capsule 100 mg (100 mg Oral Refused 09/20/19 1216)  pantoprazole (PROTONIX) EC tablet 40 mg (has no administration in time range)  polyethylene glycol (MIRALAX / GLYCOLAX) packet 17 g (has no administration in time range)  tamsulosin (FLOMAX) capsule 0.4 mg (0.4 mg Oral Given 09/20/19 1120)  rivaroxaban (XARELTO) tablet 20 mg (has no administration in time range)  cycloSPORINE (RESTASIS) 0.05 % ophthalmic emulsion 1 drop (1 drop Both Eyes Given 09/20/19 1123)  acetaminophen (TYLENOL) tablet 650 mg (has no administration in time range)    Or  acetaminophen (TYLENOL) suppository 650 mg (has no administration in time range)  ondansetron (ZOFRAN) tablet 4 mg (has no administration in time range)    Or  ondansetron (ZOFRAN) injection 4 mg (has no administration in time range)  hydrocortisone sodium succinate (SOLU-CORTEF) 100 MG injection 50 mg (50 mg Intravenous Given 09/20/19 0744)  calcium-vitamin D (OSCAL WITH D) 500-200 MG-UNIT per tablet 1 tablet (has no administration in time range)  0.9 %  sodium  chloride infusion (has no administration in time range)  iohexol (OMNIPAQUE) 350 MG/ML injection 80 mL (80 mLs Intravenous Contrast Given 09/20/19 0014)  lactated ringers bolus 1,000 mL (0 mLs Intravenous Stopped 09/20/19 0227)  zolpidem (AMBIEN) tablet 2.5 mg (2.5 mg Oral Given 09/20/19 0203)    Mobility walks with device Moderate fall risk   Focused Assessments resp   R Recommendations: See Admitting Provider Note  Report given to:   Additional Notes:

## 2019-09-20 NOTE — ED Notes (Signed)
[facility] called for transport to [facility] 

## 2019-09-20 NOTE — Progress Notes (Signed)
PROGRESS NOTE  [name] [id number] DOB: [birth year] DOA: 09/19/2019 PCP: Zachary Pretty, MD  HPI/Recap of past 24 hours: HPI from Dr Laurena Slimmer is a 78 y.o. 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 Had radiation therapy 09/19/19, and after going home patient started feeling very weak and short of breath.  He was not able to even walk few steps because of the shortness of breath and weakness.  As per patient's wife patient has not been eating well last 1 week.  Has not had any nausea vomiting diarrhea fever or chills. In the ED, patient was very tachycardic with heart rate in the 140s.  Afebrile.  Labs showed pancytopenia. Patient was started on sepsis protocol, fluids and blood cultures were obtained but no antibiotics were started since patient was not febrile. COVID-19 test was negative. UA shows nothing acute.  Patient admitted for generalized weakness with tachycardia.    Today, pt denies any new complaints. SOB has somewhat improved, although still remains significantly tachycardic. Pt denies any chest pain, abdominal pain, N/V, fever/chills   Assessment/Plan: Principal Problem:   Weakness generalized Active Problems:   Prostate cancer metastatic to multiple sites [facility])   Sinus tachycardia   Neutropenia (HCC)   Thrombocytopenia (HCC)  SIRS Tachycardia, neutropenia on presentation Currently afebrile BC x2 pending CTA chest no clear evidence of pneumonia, no PE UA negative, UC pending COVID-19 negative TSH within normal limits EKG with sinus tachycardia IV fluids Since no fever, with no clear source of infection, will not start antibiotics for now Monitor fever curve  Generalized weakness Likely due to possible progression of disease in addition to poor oral intake Sepsis work-up unrevealing Continue IV fluids, Megace Consult to dietitian PT ordered  Pancytopenia Afebrile Likely 2/2  chemotherapy Hemoglobin down to 7.8, ?Dilutional Type and screen pending Anemia panel, FOBT pending Daily CBC Neutropenic precautions  Advanced metastatic prostate cancer Spoke with Dr. [name] on 09/20/2019 who agreed to not starting antibiotics, but acknowledged low threshold to start if patient becomes febrile.  Will likely see patient on 09/23/2019 if patient still here or earlier if patient is transferred to [facility] long for any reason Follows with Dr. Tammi Klippel for radiation to the spine, last dose of radiation was supposed to be on 09/20/2019, Dr. [name] recommended holding off for now We will involve palliative care         Malnutrition Type:      Malnutrition Characteristics:      Nutrition Interventions:       Estimated body mass index is 24.39 kg/m as calculated from the following:   Height as of 09/12/19: 5\' 11"  (1.803 m).   Weight as of 09/12/19: 79.3 kg.     Code Status: Full  Family Communication: None at bedside  Disposition Plan: To be determined   Consultants:  Spoke to Dr. [name] on 09/20/2019  Procedures:  None  Antimicrobials:  None  DVT prophylaxis: Xarelto   Objective: Vitals:   09/20/19 0800 09/20/19 0900 09/20/19 1000 09/20/19 1100  BP: 130/72 (!) 88/51 105/67 (!) 123/91  Pulse: (!) 125 97 (!) 109 (!) 115  Resp: (!) 21 19 18  (!) 22  Temp:      TempSrc:      SpO2: 99% 100% 98% 100%   No intake or output data in the 24 hours ending 09/20/19 1206 There were no vitals filed for this visit.  Exam:  General: NAD   Cardiovascular: S1, S2 present  Respiratory: CTAB  Abdomen: Soft, nontender, nondistended, bowel sounds present  Musculoskeletal: No bilateral pedal edema noted  Skin: Normal  Psychiatry: Normal mood   Data Reviewed: CBC: Recent Labs  Lab 09/19/19 2205 09/20/19 0455  WBC 0.6* 0.6*  NEUTROABS 0.3* 0.4*  HGB 9.2* 7.8*  HCT 29.6* 24.6*  MCV 94.0 92.8  PLT 128* 99*   Basic Metabolic Panel: Recent  Labs  Lab 09/19/19 2205 09/20/19 0455  NA 133* 133*  K 3.9 3.6  CL 106 107  CO2 18* 19*  GLUCOSE 116* 100*  BUN 14 12  CREATININE 0.77 0.66  CALCIUM 7.9* 7.6*   GFR: Estimated Creatinine Clearance: 82.4 mL/min (by C-G formula based on SCr of 0.66 mg/dL). Liver Function Tests: Recent Labs  Lab 09/19/19 2205 09/20/19 0455  AST 24 19  ALT 15 14  ALKPHOS 59 54  BILITOT 0.9 0.7  PROT 5.1* 4.3*  ALBUMIN 2.7* 2.3*   No results for input(s): LIPASE, AMYLASE in the last 168 hours. No results for input(s): AMMONIA in the last 168 hours. Coagulation Profile: No results for input(s): INR, PROTIME in the last 168 hours. Cardiac Enzymes: No results for input(s): CKTOTAL, CKMB, CKMBINDEX, TROPONINI in the last 168 hours. BNP (last 3 results) No results for input(s): PROBNP in the last 8760 hours. HbA1C: No results for input(s): HGBA1C in the last 72 hours. CBG: No results for input(s): GLUCAP in the last 168 hours. Lipid Profile: No results for input(s): CHOL, HDL, LDLCALC, TRIG, CHOLHDL, LDLDIRECT in the last 72 hours. Thyroid Function Tests: Recent Labs    09/20/19 0455  TSH 0.894   Anemia Panel: No results for input(s): VITAMINB12, FOLATE, FERRITIN, TIBC, IRON, RETICCTPCT in the last 72 hours. Urine analysis:    Component Value Date/Time   COLORURINE AMBER (A) 09/19/2019 2200   APPEARANCEUR HAZY (A) 09/19/2019 2200   LABSPEC 1.027 09/19/2019 2200   PHURINE 5.0 09/19/2019 2200   GLUCOSEU NEGATIVE 09/19/2019 2200   HGBUR NEGATIVE 09/19/2019 2200   BILIRUBINUR SMALL (A) 09/19/2019 2200   KETONESUR 20 (A) 09/19/2019 2200   PROTEINUR 100 (A) 09/19/2019 2200   UROBILINOGEN 0.2 01/22/2015 0939   NITRITE NEGATIVE 09/19/2019 2200   LEUKOCYTESUR NEGATIVE 09/19/2019 2200   Sepsis Labs: @LABRCNTIP (procalcitonin:4,lacticidven:4)  ) Recent Results (from the past 240 hour(s))  Culture, blood (routine x 2)     Status: None (Preliminary result)   Collection Time: 09/19/19  10:38 PM   Specimen: BLOOD RIGHT HAND  Result Value Ref Range Status   Specimen Description BLOOD RIGHT HAND  Final   Special Requests   Final    BOTTLES DRAWN AEROBIC AND ANAEROBIC Blood Culture results may not be optimal due to an inadequate volume of blood received in culture bottles   Culture   Final    NO GROWTH < 12 HOURS Performed at [facility], [place] [address] [place], [place] [address]    Report Status PENDING  Incomplete  SARS Coronavirus 2 [facility] order, Performed in [facility] hospital lab) Nasopharyngeal Nasopharyngeal Swab     Status: None   Collection Time: 09/19/19 10:50 PM   Specimen: Nasopharyngeal Swab  Result Value Ref Range Status   SARS Coronavirus 2 NEGATIVE NEGATIVE Final    Comment: (NOTE) If result is NEGATIVE SARS-CoV-2 target nucleic acids are NOT DETECTED. The SARS-CoV-2 RNA is generally detectable in upper and lower  respiratory specimens during the acute phase of infection. The lowest  concentration of SARS-CoV-2 viral copies this assay can detect is  250  copies / mL. A negative result does not preclude SARS-CoV-2 infection  and should not be used as the sole basis for treatment or other  patient management decisions.  A negative result may occur with  improper specimen collection / handling, submission of specimen other  than nasopharyngeal swab, presence of viral mutation(s) within the  areas targeted by this assay, and inadequate number of viral copies  (<250 copies / mL). A negative result must be combined with clinical  observations, patient history, and epidemiological information. If result is POSITIVE SARS-CoV-2 target nucleic acids are DETECTED. The SARS-CoV-2 RNA is generally detectable in upper and lower  respiratory specimens dur ing the acute phase of infection.  Positive  results are indicative of active infection with SARS-CoV-2.  Clinical  correlation with patient history and other diagnostic information is  necessary to  determine patient infection status.  Positive results do  not rule out bacterial infection or co-infection with other viruses. If result is PRESUMPTIVE POSTIVE SARS-CoV-2 nucleic acids MAY BE PRESENT.   A presumptive positive result was obtained on the submitted specimen  and confirmed on repeat testing.  While 2019 novel coronavirus  (SARS-CoV-2) nucleic acids may be present in the submitted sample  additional confirmatory testing may be necessary for epidemiological  and / or clinical management purposes  to differentiate between  SARS-CoV-2 and other Sarbecovirus currently known to infect humans.  If clinically indicated additional testing with an alternate test  methodology [phone]) is advised. The SARS-CoV-2 RNA is generally  detectable in upper and lower respiratory sp ecimens during the acute  phase of infection. The expected result is Negative. Fact Sheet for Patients:  StrictlyIdeas.no Fact Sheet for Healthcare Providers: BankingDealers.co.za This test is not yet approved or cleared by the [place] FDA and has been authorized for detection and/or diagnosis of SARS-CoV-2 by FDA under an Emergency Use Authorization (EUA).  This EUA will remain in effect (meaning this test can be used) for the duration of the COVID-19 declaration under Section 564(b)(1) of the Act, 21 U.S.C. section 360bbb-3(b)(1), unless the authorization is terminated or revoked sooner. Performed at [facility], [place] [address] [place], [place] [address]   Culture, blood (routine x 2)     Status: None (Preliminary result)   Collection Time: 09/19/19 11:22 PM   Specimen: BLOOD  Result Value Ref Range Status   Specimen Description BLOOD PORTA CATH  Final   Special Requests   Final    BOTTLES DRAWN AEROBIC AND ANAEROBIC Blood Culture adequate volume   Culture   Final    NO GROWTH < 12 HOURS Performed at [facility], [place] [address]  [place], [place] [address]    Report Status PENDING  Incomplete      Studies: Dg Chest 2 View  Result Date: 09/19/2019 CLINICAL DATA:  Prostate cancer on chemo radiation, acute onset shortness of breath EXAM: CHEST - 2 VIEW COMPARISON:  CT 08/20/2019, radiograph 05/17/2019 FINDINGS: Right IJ approach Port-A-Cath tip terminates near the superior cavoatrial junction. Stable bandlike areas of scarring with basilar areas of atelectasis. No consolidation, features of edema, pneumothorax, or effusion. The cardiomediastinal contours are unremarkable. Degenerative changes are present in the imaged spine and shoulders. IMPRESSION: Bandlike areas of stable scarring. No acute cardiopulmonary disease. Electronically Signed   By: Lovena Le M.D.   On: 09/19/2019 22:45   Ct Angio Chest Pe W And/or Wo Contrast  Result Date: 09/20/2019 CLINICAL DATA:  PE suspected shortness of  breath for 1 day, metastatic prostate cancer EXAM: CT ANGIOGRAPHY CHEST WITH CONTRAST TECHNIQUE: Multidetector CT imaging of the chest was performed using the standard protocol during bolus administration of intravenous contrast. Multiplanar CT image reconstructions and MIPs were obtained to evaluate the vascular anatomy. CONTRAST:  75mL OMNIPAQUE IOHEXOL 350 MG/ML SOLN COMPARISON:  CT chest, abdomen and pelvis 08/20/2019 FINDINGS: Cardiovascular: Study demonstrates preferential opacification of the thoracic aorta. Opacification of the coronary arteries is suboptimal but sufficient to exclude central and lobar filling defects. No visible pulmonary arterial filling defects are identified. There is mild narrowing of the left and right upper lobar pulmonary arteries by the metastatic hilar nodal masses. The normal caliber thoracic aorta demonstrates atheromatous plaque but no acute luminal irregularity or periaortic stranding. Common origin of the brachiocephalic and left common carotid artery. Minimal plaque within the proximal great vessels. Cardiac  size is within normal limits. Small volume of pericardial fluid is likely within physiologic normal, not significantly increased from prior exam. No frank pericardial effusion. Atherosclerotic calcification of the coronary arteries. Mediastinum/Nodes: Normal appearance of the thyroid gland. Trachea and esophagus are unremarkable. Reference nodes include: Grossly stable size and appearance of a large left supraclavicular nodal mass measuring 3 x 3.6 cm in size and encasing the left common carotid artery (7/9). Partially necrotic right hilar lymph node narrowing the central pulmonary artery measures 1.9 cm in size, unchanged from prior (7/64). Partially necrotic left hilar lymph node narrowing the left central pulmonary arteries measures 1.0 cm in size also unchanged (7/57). Left paratracheal lymph node measures 11 mm, not significantly changed (7/55). Lungs/Pleura: Small bilateral pleural effusions with adjacent areas of ground-glass opacity likely reflecting atelectatic change. Stable appearance of a subpleural right upper lobe nodule measuring approximately 9 mm in size, not significantly changed (9/37). No new nodules or masses. No consolidative process. No pneumothorax. Upper Abdomen: Markedly nodular hepatic surface contour with multiple ill-defined hypoattenuating lesions throughout the liver parenchyma. Largest lesion in the posterior right lobe liver measures up to 5.2 cm in size but the overall margins of which are less clearly delineated on this phase of contrast enhancement. Partial visualization of a left renal cyst. No acute abnormalities present in the visualized portions of the upper abdomen. Musculoskeletal: Bilateral gynecomastia. Multilevel degenerative changes are present in the imaged portions of the spine. No suspicious osseous lesions. Review of the MIP images confirms the above findings. IMPRESSION: 1. Suboptimal opacification of the pulmonary arteries. No evidence of central or lobar pulmonary  embolism. 2. Grossly stable bilateral hilar and mediastinal adenopathy, consistent with metastatic disease, much of which is unchanged from staging CT 1 month prior. 3. Supraclavicular nodal mass with encasement of the left common carotid artery. 4. New bilateral pleural effusions with adjacent areas of ground-glass opacity likely reflecting atelectatic change. 5. Stable sub solid nodule in the right upper lobe. 6. Markedly nodular hepatic surface contour with multiple ill-defined hypoattenuating lesions throughout the liver parenchyma, consistent with known metastatic disease. 7. Aortic Atherosclerosis (ICD10-I70.0). Electronically Signed   By: Lovena Le M.D.   On: 09/20/2019 00:47    Scheduled Meds:  [START ON 09/23/2019] calcium-vitamin D  1 tablet Oral 3 times weekly   cycloSPORINE  1 drop Both Eyes BID   docusate sodium  100 mg Oral BID   ezetimibe  10 mg Oral Daily   hydrocortisone sod succinate (SOLU-CORTEF) inj  50 mg Intravenous Q6H   megestrol  400 mg Oral BID   pantoprazole  40 mg Oral QPC supper   [  START ON 09/21/2019] predniSONE  5 mg Oral BID WC   rivaroxaban  20 mg Oral Q supper   tamsulosin  0.4 mg Oral Daily    Continuous Infusions:   LOS: 0 days     Alma Friendly, MD [facility]  If 7PM-7AM, please contact night-coverage [email or web address] 09/20/2019, 12:06 PM

## 2019-09-20 NOTE — ED Notes (Signed)
Tele   Breakfast ordered  

## 2019-09-20 NOTE — ED Notes (Signed)
Pt moved to hospital bed for comfort.

## 2019-09-20 NOTE — ED Notes (Signed)
SDU   Ordered hospital bed 

## 2019-09-20 NOTE — Progress Notes (Signed)
Critical low WBC's and awaiting transfer to [facility].   Will try again at another time when transfusion complete.    09/20/19 1300  PT Visit Information  Last PT Received On 09/20/19  Reason Eval/Treat Not Completed Medical issues which prohibited therapy    Mee Hives, PT MS Acute Rehab Dept. Number: [place] [facility] [place]

## 2019-09-20 NOTE — ED Notes (Signed)
carelink called and report called to 

## 2019-09-21 DIAGNOSIS — C61 Malignant neoplasm of prostate: Secondary | ICD-10-CM

## 2019-09-21 DIAGNOSIS — Z515 Encounter for palliative care: Secondary | ICD-10-CM

## 2019-09-21 DIAGNOSIS — Z7189 Other specified counseling: Secondary | ICD-10-CM

## 2019-09-21 LAB — BASIC METABOLIC PANEL
Anion gap: 8 (ref 5–15)
BUN: 11 mg/dL (ref 8–23)
CO2: 17 mmol/L — ABNORMAL LOW (ref 22–32)
Calcium: 7 mg/dL — ABNORMAL LOW (ref 8.9–10.3)
Chloride: 107 mmol/L (ref 98–111)
Creatinine, Ser: 0.55 mg/dL — ABNORMAL LOW (ref 0.61–1.24)
GFR calc Af Amer: >60 mL/min (ref >60)
GFR calc non Af Amer: >60 mL/min (ref >60)
Glucose, Bld: 100 mg/dL — ABNORMAL HIGH (ref 70–99)
Potassium: 3 mmol/L — ABNORMAL LOW (ref 3.5–5.1)
Sodium: 132 mmol/L — ABNORMAL LOW (ref 135–145)

## 2019-09-21 LAB — TYPE AND SCREEN
ABO/RH(D): O POS
Antibody Screen: NEGATIVE

## 2019-09-21 LAB — PREALBUMIN: Prealbumin: 9.5 mg/dL — ABNORMAL LOW (ref 18–38)

## 2019-09-21 MED ORDER — PHENOL 1.4 % MT LIQD
1.0000 | OROMUCOSAL | Status: DC | PRN
  Filled 2019-09-21: qty 177

## 2019-09-21 MED ORDER — MAGIC MOUTHWASH W/LIDOCAINE
10.0000 mL | Freq: Four times a day (QID) | ORAL | Status: DC
  Administered 2019-09-21: 5 mL via ORAL
  Administered 2019-09-21 – 2019-09-24 (×6): 10 mL via ORAL
  Filled 2019-09-21 (×16): qty 10

## 2019-09-21 MED ORDER — DOCUSATE SODIUM 100 MG PO CAPS
100.0000 mg | ORAL_CAPSULE | Freq: Two times a day (BID) | ORAL | Status: DC
  Filled 2019-09-21 (×3): qty 1

## 2019-09-21 MED ORDER — ALUM & MAG HYDROXIDE-SIMETH 200-200-20 MG/5ML PO SUSP
30.0000 mL | Freq: Four times a day (QID) | ORAL | Status: DC | PRN
  Administered 2019-09-21: 30 mL via ORAL
  Filled 2019-09-21: qty 30

## 2019-09-21 MED ORDER — SODIUM CHLORIDE 0.9% FLUSH: 10.0000 mL | INTRAVENOUS | Status: DC | PRN

## 2019-09-21 MED ORDER — TRAZODONE HCL 50 MG PO TABS
50.0000 mg | ORAL_TABLET | Freq: Every evening | ORAL | Status: DC | PRN
  Administered 2019-09-21: 50 mg via ORAL
  Filled 2019-09-21: qty 1

## 2019-09-21 MED ORDER — LACTATED RINGERS IV SOLN
INTRAVENOUS | Status: DC
  Administered 2019-09-21 – 2019-09-23 (×4): via INTRAVENOUS

## 2019-09-21 NOTE — Progress Notes (Signed)
Agree with Ricke Hey Student Nurse, [facility] notes.

## 2019-09-21 NOTE — Evaluation (Signed)
Physical Therapy Evaluation Patient Details Name: [name] MRN: [id number] DOB: 1941/07/03 Today's Date: 09/21/2019   History of Present Illness  [name] is a 78 y.o. 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 Had radiation therapy 09/19/19, and after going home patient started feeling very weak and short of breath.  He was not able to even walk few steps because of the shortness of breath and weakness.  Clinical Impression  Pt admitted with above diagnosis.  Pt currently with functional limitations due to the deficits listed below (see PT Problem List). Pt will benefit from skilled PT to increase their independence and safety with mobility to allow discharge to the venue listed below.  Pt with elevated HR up to 152 bpm after walking 15 ft in the room. Pt would benefit from OT assessment.  Recommend HHPT.     Follow Up Recommendations Home health PT;Supervision for mobility/OOB    Equipment Recommendations  None recommended by PT    Recommendations for Other Services OT consult     Precautions / Restrictions Precautions Precautions: Fall;Other (comment) Precaution Comments: monitor HR Restrictions Weight Bearing Restrictions: No      Mobility  Bed Mobility Overal bed mobility: Needs Assistance Bed Mobility: Supine to Sit     Supine to sit: Min assist;Min guard     General bed mobility comments: Pt needing MIN to MIN/guard to come to EOB.  Transfers Overall transfer level: Needs assistance Equipment used: Rolling walker (2 wheeled) Transfers: Sit to/from Stand Sit to Stand: Min assist         General transfer comment: MIN A to power up  Ambulation/Gait Ambulation/Gait assistance: Min guard Gait Distance (Feet): 15 Feet Assistive device: Rolling walker (2 wheeled) Gait Pattern/deviations: Step-through pattern;Trunk flexed     General Gait Details: Amb short distance around room and then sat down.   Checked HR and it was up to 152 bpm o2 95%.  With rest, HR down to 122 by the time PT left the room. Nursing notified.  Stairs            Wheelchair Mobility    Modified Rankin (Stroke Patients Only)       Balance Overall balance assessment: Needs assistance Sitting-balance support: Feet supported Sitting balance-Leahy Scale: Good       Standing balance-Leahy Scale: Poor Standing balance comment: requires UE support                             Pertinent Vitals/Pain Pain Assessment: No/denies pain    Home Living Family/patient expects to be discharged to:: Private residence Living Arrangements: Spouse/significant other Available Help at Discharge: Family;Available 24 hours/day Type of Home: House Home Access: Ramped entrance     Home Layout: One level Home Equipment: Walker - 2 wheels;Cane - single point;Wheelchair - manual;Bedside commode;Grab bars - tub/shower;Grab bars - toilet;Shower seat;Other (comment)(adjustable bed)      Prior Function Level of Independence: Independent with assistive device(s)         Comments: recent increase in use of w/c due to weakness     Hand Dominance   Dominant Hand: Right    Extremity/Trunk Assessment   Upper Extremity Assessment Upper Extremity Assessment: Generalized weakness    Lower Extremity Assessment Lower Extremity Assessment: Generalized weakness       Communication   Communication: No difficulties  Cognition Arousal/Alertness: Awake/alert Behavior During Therapy: WFL for tasks assessed/performed Overall Cognitive Status:  Within Functional Limits for tasks assessed                                        General Comments      Exercises     Assessment/Plan    PT Assessment Patient needs continued PT services  PT Problem List Decreased strength;Decreased activity tolerance;Decreased balance;Decreased mobility;Decreased coordination;Decreased knowledge of use of  DME;Decreased safety awareness;Cardiopulmonary status limiting activity       PT Treatment Interventions DME instruction;Gait training;Functional mobility training;Neuromuscular re-education;Balance training;Therapeutic exercise;Therapeutic activities;Patient/family education    PT Goals (Current goals can be found in the Care Plan section)  Acute Rehab PT Goals Patient Stated Goal: home PT Goal Formulation: With patient Time For Goal Achievement: 10/05/19 Potential to Achieve Goals: Good    Frequency Min 3X/week   Barriers to discharge        Co-evaluation               AM-PAC PT "6 Clicks" Mobility  Outcome Measure Help needed turning from your back to your side while in a flat bed without using bedrails?: A Little Help needed moving from lying on your back to sitting on the side of a flat bed without using bedrails?: A Little Help needed moving to and from a bed to a chair (including a wheelchair)?: A Little Help needed standing up from a chair using your arms (e.g., wheelchair or bedside chair)?: A Little Help needed to walk in hospital room?: A Little Help needed climbing 3-5 steps with a railing? : A Lot 6 Click Score: 17    End of Session Equipment Utilized During Treatment: Gait belt Activity Tolerance: Treatment limited secondary to medical complications (Comment)(tachycardia) Patient left: in chair;with call bell/phone within reach;with chair alarm set Nurse Communication: Mobility status PT Visit Diagnosis: Unsteadiness on feet (R26.81);Muscle weakness (generalized) (M62.81);Difficulty in walking, not elsewhere classified (R26.2)    Time: 1096-0454 PT Time Calculation (min) (ACUTE ONLY): 26 min   Charges:   PT Evaluation $PT Eval Moderate Complexity: 1 Mod PT Treatments $Gait Training: 8-22 mins        Lanice Folden L. Katrinka Blazing, [place] Pager [phone] 09/21/2019   Enzo Montgomery 09/21/2019, 12:54 PM

## 2019-09-21 NOTE — Progress Notes (Signed)
Pt continues to have tachycardia since admission. Spoke directly with physician this morning who is aware.  Yellow MEWS in progress.  Will continue to monitor.   Current Heart Rate: 101 bpm

## 2019-09-21 NOTE — Consult Note (Signed)
Consultation Note Date: 09/21/2019   Patient Name: [name]  DOB: February 20, 1941  MRN: [id number]  Age / Sex: 78 y.o., male  PCP: [name], MD Referring Physician: Lavina Hamman, MD  Reason for Consultation: Establishing goals of care  HPI/Patient Profile: 78 y.o. male admitted on 09/19/2019    [name] a 78 y.o.malewithhistory of metastatic prostate cancer currently receiving radiation and chemo.  Clinical Assessment and Goals of Care:  Patient with life limiting illness of advanced metastatic prostate cancer. Patient with generalized weakness and admitted with SIRS. A palliative consult has been requested.   Patient is sitting up in chair, wife at bedside.   Palliative medicine is specialized medical care for people living with serious illness. It focuses on providing relief from the symptoms and stress of a serious illness. The goal is to improve quality of life for both the patient and the family.  Goals of care: Broad aims of medical therapy in relation to the patient's values and preferences. Our aim is to provide medical care aimed at enabling patients to achieve the goals that matter most to them, given the circumstances of their particular medical situation and their constraints.    Patient admitted with generalized weakness, poor PO intake. See below.   NEXT OF KIN  wife judy who is at the bedside.   SUMMARY OF RECOMMENDATIONS.  Full code full scope for now. Patient on OxyIR, prednisone, also on bowel and anti emetic regimen Add throat spray for symptom management For now, it appears that patient and wife wish to continue with current treatment plan of finishing radiation treatments, as well as consideration for chemotherapy. Will likely receive input from med onc Dr [name] for further directions.  Continue current mode of care for now. Thank you for the consult.     Code Status/Advance Care Planning:  Full code    Symptom Management:    as above.   Palliative Prophylaxis:   Delirium Protocol  Additional Recommendations (Limitations, Scope, Preferences):  Full Scope Treatment  Psycho-social/Spiritual:   Desire for further Chaplaincy support:yes  Additional Recommendations: Caregiving  Support/Resources  Prognosis:   Unable to determine  Discharge Planning: To Be Determined      Primary Diagnoses: Present on Admission:  Sinus tachycardia  Prostate cancer metastatic to multiple sites ([place])  Neutropenia ([place])  Thrombocytopenia ([place])   I have reviewed the medical record, interviewed the patient and family, and examined the patient. The following aspects are pertinent.  Past Medical History:  Diagnosis Date   Arthritis    Barrett's esophagus    Bone cancer ([place])    Cataract    GERD (gastroesophageal reflux disease)    takes Protonix daily   History of colon polyps    Hyperlipidemia    takes Zetia and Welchol daily   Joint pain    Muscle spasm    takes Robaxin daily as needed   Prostate cancer ([place]) dx'd 10/27/2014   multiple recurrences with bone mets 07/13/15 and 06/24/18   Social History  Socioeconomic History   Marital status: Married    Spouse name: Not on file   Number of children: 3   Years of education: Not on file   Highest education level: Not on file  Occupational History   Not on file  Social Needs   Financial resource strain: Not on file   Food insecurity    Worry: Not on file    Inability: Not on file   Transportation needs    Medical: Not on file    Non-medical: Not on file  Tobacco Use   Smoking status: Former Smoker    Packs/day: 0.25    Years: 2.00    Pack years: 0.50    Quit date: 12/19/1968    Years since quitting: 50.7   Smokeless tobacco: Former Systems developer   Tobacco comment: quit smoking 58yrs ago  Substance and Sexual Activity   Alcohol use: No   Drug use:  No   Sexual activity: Not Currently  Lifestyle   Physical activity    Days per week: Not on file    Minutes per session: Not on file   Stress: Not on file  Relationships   Social connections    Talks on phone: Not on file    Gets together: Not on file    Attends religious service: Not on file    Active member of club or organization: Not on file    Attends meetings of clubs or organizations: Not on file    Relationship status: Not on file  Other Topics Concern   Not on file  Social History Narrative   Not on file   Family History  Problem Relation Age of Onset   Colon cancer Mother    Colon cancer Brother    Prostate cancer Brother    Scheduled Meds:  [START ON 09/23/2019] calcium-vitamin D  1 tablet Oral 3 times weekly   Chlorhexidine Gluconate Cloth  6 each Topical Daily   cycloSPORINE  1 drop Both Eyes BID   [START ON 09/22/2019] docusate sodium  100 mg Oral BID   ezetimibe  10 mg Oral Daily   magic mouthwash w/lidocaine  10 mL Oral QID   megestrol  400 mg Oral BID   pantoprazole  40 mg Oral QPC supper   predniSONE  5 mg Oral BID WC   rivaroxaban  20 mg Oral Q supper   tamsulosin  0.4 mg Oral Daily   Continuous Infusions:  sodium chloride 75 mL/hr at 09/21/19 0554   lactated ringers     PRN Meds:.acetaminophen **OR** acetaminophen, ondansetron **OR** ondansetron (ZOFRAN) IV, oxyCODONE, phenol, polyethylene glycol, sodium chloride flush, traMADol Medications Prior to Admission:  Prior to Admission medications   Medication Sig Start Date End Date Taking? Authorizing Provider  acetaminophen (TYLENOL) 500 MG tablet Take 500 mg by mouth daily. Pt takes 5 days after chemo   Yes [provider]  antiseptic oral rinse (BIOTENE) LIQD 15 mLs by Mouth Rinse route as needed for dry mouth.   Yes [provider]  Calcium Carb-Cholecalciferol (CALCIUM 1000 + D PO) Take 30 mLs by mouth 3 (three) times a week. Calcium 1000 mg / Vit D 1000 iu     Yes [provider]  cycloSPORINE (RESTASIS) 0.05 % ophthalmic emulsion Place 1 drop into both eyes 2 (two) times daily.   Yes [provider]  docusate sodium (COLACE) 100 MG capsule Take 100 mg by mouth 2 (two) times daily.   Yes [provider]  ezetimibe (  ZETIA) 10 MG tablet Take 10 mg by mouth daily.    Yes [provider]  ibuprofen (ADVIL,MOTRIN) 200 MG tablet Take 200 mg by mouth every 6 (six) hours as needed for moderate pain.   Yes [provider]  leuprolide, 6 Month, (ELIGARD) 45 MG injection Inject 45 mg into the skin every 6 (six) months. 09/03/19  Yes [provider]  lidocaine-prilocaine (EMLA) cream Apply 1 application topically as needed. 04/25/19  Yes [name], MD  Loratadine (CLARITIN) 10 MG CAPS Take 10 mg by mouth daily. Patient takes every day for 5 days after chemo treatment.   Yes [provider]  megestrol (MEGACE) 400 MG/10ML suspension Take 10 mLs (400 mg total) by mouth 2 (two) times daily. 08/23/19  Yes [name], MD  ondansetron (ZOFRAN) 4 MG tablet Take 4 mg by mouth every 8 (eight) hours as needed for nausea or vomiting.   Yes [provider]  oxyCODONE (OXY IR/ROXICODONE) 5 MG immediate release tablet Take 1-2 tablets (5-10 mg total) by mouth every 4 (four) hours as needed for severe pain. 08/23/19  Yes [name], MD  pantoprazole (PROTONIX) 40 MG tablet Take 40 mg by mouth daily after supper.    Yes [provider]  polyethylene glycol (MIRALAX / GLYCOLAX) packet Take 17 g by mouth daily as needed for mild constipation. 08/17/18  Yes Vann, Jessica U, DO  predniSONE (DELTASONE) 5 MG tablet TAKE 1 TABLET BY MOUTH 2 TIMES DAILY WITH A MEAL. Patient taking differently: Take 5 mg by mouth 2 (two) times daily with a meal.  07/15/19  Yes Shadad, Mathis Dad, MD  prochlorperazine (COMPAZINE) 10 MG tablet Take 1 tablet (10 mg total) by mouth every 6 (six) hours as needed for nausea or  vomiting. 04/25/19  Yes [name], MD  tamsulosin (FLOMAX) 0.4 MG CAPS capsule Take 0.4 mg by mouth daily.  08/10/16  Yes [provider]  traMADol (ULTRAM) 50 MG tablet TAKE 1 TABLET BY MOUTH EVERY 6 HOURS AS NEEDED FOR PAIN Patient taking differently: Take 50 mg by mouth every 6 (six) hours as needed for moderate pain.  05/07/19  Yes [name], MD  triamcinolone cream (KENALOG) 0.1 % Apply 1 application topically daily as needed (dry skin in the winter).    Yes [provider]  XARELTO 20 MG TABS tablet TAKE 1 TABLET (20 MG TOTAL) BY MOUTH DAILY WITH SUPPER FOR 30 DAYS. Patient taking differently: Take 20 mg by mouth daily with supper.  08/19/19  Yes [name], MD   Allergies  Allergen Reactions   Statins Other (See Comments)    Muscle pain   Review of Systems Denies pain  Physical Exam No acute distress Clear S1 S2 Abdomen non tender No edema Non focal  Vital Signs: BP 101/70    Pulse (!) 116    Temp 98.4 F (36.9 C) (Oral)    Resp 18    Wt 82.2 kg    SpO2 99%    BMI 25.27 kg/m  Pain Scale: 0-10   Pain Score: Asleep   SpO2: SpO2: 99 % O2 Device:SpO2: 99 % O2 Flow Rate: .O2 Flow Rate (L/min): 3 L/min  IO: Intake/output summary:   Intake/Output Summary (Last 24 hours) at 09/21/2019 1346 Last data filed at 09/21/2019 0600 Gross per 24 hour  Intake 1006.23 ml  Output --  Net 1006.23 ml    LBM: Last BM Date: 09/20/19 Baseline Weight: Weight: 82.2 kg  Most recent weight: Weight: 82.2 kg     Palliative Assessment/Data:   PPS 40%  Time In:  1300 Time Out: 1400  Time Total:  60 min  Greater than 50%  of this time was spent counseling and coordinating care related to the above assessment and plan.  Signed by: Loistine Chance, MD   [phone] Please contact Palliative Medicine Team phone at [phone] for questions and concerns.  For individual provider: See Shea Evans

## 2019-09-21 NOTE — Progress Notes (Signed)
Pt up to use bedside toilet.

## 2019-09-21 NOTE — Progress Notes (Signed)
PROGRESS NOTE  KENWARD QUINATA [id number] DOB: 06-02-1941 DOA: 09/19/2019 PCP: [name], MD  HPI/Recap of past 24 hours: HPI from Dr Laurena Slimmer is a 78 y.o. 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 Had radiation therapy 09/19/19, and after going home patient started feeling very weak and short of breath.  He was not able to even walk few steps because of the shortness of breath and weakness.  As per patient's wife patient has not been eating well last 1 week.  Has not had any nausea vomiting diarrhea fever or chills. In the ED, patient was very tachycardic with heart rate in the 140s.  Afebrile.  Labs showed pancytopenia. Patient was started on sepsis protocol, fluids and blood cultures were obtained but no antibiotics were started since patient was not febrile. COVID-19 test was negative. UA shows nothing acute.  Patient admitted for generalized weakness with tachycardia  Subjective. Patient denies any acute complaint.  Unable to sleep last night due to diarrhea.  No blood in the stool.  No nausea no vomiting.  Also reports that he had 2 episodes of diarrhea prior to coming to the hospital and 2 episodes of diarrhea day before that as well.   Assessment/Plan: Principal Problem:   Weakness generalized Active Problems:   Prostate cancer metastatic to multiple sites [facility])   Sinus tachycardia   Neutropenia ([facility])   Thrombocytopenia ([facility])   Generalized weakness  SIRS Tachycardia, neutropenia on presentation Currently afebrile BC x2 pending CTA chest no clear evidence of pneumonia, no PE UA negative, UC pending COVID-19 negative TSH within normal limits EKG with sinus tachycardia IV fluids Since no fever, with no clear source of infection, will not start antibiotics for now Monitor fever curve  Generalized weakness Likely due to possible progression of disease in addition to poor oral intake Sepsis work-up  unrevealing Continue IV fluids, Megace Consult to dietitian PT ordered  Pancytopenia Afebrile Likely 2/2 chemotherapy Hemoglobin down to 7.8, ?Dilutional Anemia panel, FOBT pending Daily CBC Neutropenic precautions  Advanced metastatic prostate cancer Spoke with Dr. [name] on 09/20/2019 who agreed to not starting antibiotics, but acknowledged low threshold to start if patient becomes febrile. Follows with Dr. Tammi Klippel for radiation to the spine, last dose of radiation was supposed to be on 09/20/2019, Dr. [name] recommended holding off for now Cancer appears to be significantly advanced. Multiple necrotic nodes as well as encasement of left internal carotid artery. We will involve palliative care  Diarrhea. Likely in the setting of chemotherapy as well as taking stool softener. Continue to monitor. Patient is at risk for C. difficile infection and has reported diarrhea prior to admission.  Currently no signs and symptoms of C. difficile. Monitor closely.  Code Status: Full  Family Communication: None at bedside  Disposition Plan: To be determined   Consultants:  Spoke to Dr. [name] on 09/20/2019  Procedures:  None  Antimicrobials:  None  DVT prophylaxis: Xarelto   Objective: Vitals:   09/21/19 0930 09/21/19 1335 09/21/19 1624 09/21/19 1700  BP: (!) 102/58 101/70  124/67  Pulse: (!) 115 (!) 116 (!) 108 (!) 113  Resp: 18 18  17   Temp: 99 F (37.2 C) 98.4 F (36.9 C)  98.5 F (36.9 C)  TempSrc: Oral Oral  Oral  SpO2: 98% 99% 99% 100%  Weight:        Intake/Output Summary (Last 24 hours) at 09/21/2019 1827 Last data filed at 09/21/2019 1736 Gross per 24  hour  Intake 1867.42 ml  Output -  Net 1867.42 ml   Filed Weights   09/21/19 0500  Weight: 82.2 kg    Exam: General: alert and oriented to time, place, and person. Appear in mild distress, affect appropriate Eyes: PERRL, Conjunctiva normal ENT: Oral Mucosa Clear, moist  Neck: no JVD, no Abnormal  Mass Or lumps Cardiovascular: S1 and S2 Present, no Murmur, peripheral pulses symmetrical Respiratory: good respiratory effort, Bilateral Air entry equal and Decreased, no signs of accessory muscle use, Clear to Auscultation, no Crackles, no wheezes Abdomen: Bowel Sound present, Soft and no tenderness, no hernia Skin: no rashes  Extremities: no Pedal edema, no calf tenderness Neurologic: without any new focal findings Gait not checked due to patient safety concerns    Data Reviewed: CBC: Recent Labs  Lab 09/19/19 2205 09/20/19 0455  WBC 0.6* 0.6*  NEUTROABS 0.3* 0.4*  HGB 9.2* 7.8*  HCT 29.6* 24.6*  MCV 94.0 92.8  PLT 128* 99*   Basic Metabolic Panel: Recent Labs  Lab 09/19/19 2205 09/20/19 0455 09/21/19 1038  NA 133* 133* 132*  K 3.9 3.6 3.0*  CL 106 107 107  CO2 18* 19* 17*  GLUCOSE 116* 100* 100*  BUN 14 12 11   CREATININE 0.77 0.66 0.55*  CALCIUM 7.9* 7.6* 7.0*   GFR: Estimated Creatinine Clearance: 82.4 mL/min (A) (by C-G formula based on SCr of 0.55 mg/dL (L)). Liver Function Tests: Recent Labs  Lab 09/19/19 2205 09/20/19 0455  AST 24 19  ALT 15 14  ALKPHOS 59 54  BILITOT 0.9 0.7  PROT 5.1* 4.3*  ALBUMIN 2.7* 2.3*   No results for input(s): LIPASE, AMYLASE in the last 168 hours. No results for input(s): AMMONIA in the last 168 hours. Coagulation Profile: No results for input(s): INR, PROTIME in the last 168 hours. Cardiac Enzymes: No results for input(s): CKTOTAL, CKMB, CKMBINDEX, TROPONINI in the last 168 hours. BNP (last 3 results) No results for input(s): PROBNP in the last 8760 hours. HbA1C: No results for input(s): HGBA1C in the last 72 hours. CBG: No results for input(s): GLUCAP in the last 168 hours. Lipid Profile: No results for input(s): CHOL, HDL, LDLCALC, TRIG, CHOLHDL, LDLDIRECT in the last 72 hours. Thyroid Function Tests: Recent Labs    09/20/19 0455  TSH 0.894   Anemia Panel: Recent Labs    09/20/19 1430  VITAMINB12 821   FOLATE 8.8  FERRITIN 468*  TIBC 189*  IRON 55   Urine analysis:    Component Value Date/Time   COLORURINE AMBER (A) 09/19/2019 2200   APPEARANCEUR HAZY (A) 09/19/2019 2200   LABSPEC 1.027 09/19/2019 2200   PHURINE 5.0 09/19/2019 2200   GLUCOSEU NEGATIVE 09/19/2019 2200   HGBUR NEGATIVE 09/19/2019 2200   BILIRUBINUR SMALL (A) 09/19/2019 2200   KETONESUR 20 (A) 09/19/2019 2200   PROTEINUR 100 (A) 09/19/2019 2200   UROBILINOGEN 0.2 01/22/2015 0939   NITRITE NEGATIVE 09/19/2019 2200   LEUKOCYTESUR NEGATIVE 09/19/2019 2200   Sepsis Labs: @LABRCNTIP (procalcitonin:4,lacticidven:4)  ) Recent Results (from the past 240 hour(s))  Culture, blood (routine x 2)     Status: None (Preliminary result)   Collection Time: 09/19/19 10:38 PM   Specimen: BLOOD RIGHT HAND  Result Value Ref Range Status   Specimen Description BLOOD RIGHT HAND  Final   Special Requests   Final    BOTTLES DRAWN AEROBIC AND ANAEROBIC Blood Culture results may not be optimal due to an inadequate volume of blood received in culture bottles   Culture  Final    NO GROWTH 2 DAYS Performed at [facility], [place] [address], [place], [place] [address]    Report Status PENDING  Incomplete  SARS Coronavirus 2 [facility] order, Performed in [facility] hospital lab) Nasopharyngeal Nasopharyngeal Swab     Status: None   Collection Time: 09/19/19 10:50 PM   Specimen: Nasopharyngeal Swab  Result Value Ref Range Status   SARS Coronavirus 2 NEGATIVE NEGATIVE Final    Comment: (NOTE) If result is NEGATIVE SARS-CoV-2 target nucleic acids are NOT DETECTED. The SARS-CoV-2 RNA is generally detectable in upper and lower  respiratory specimens during the acute phase of infection. The lowest  concentration of SARS-CoV-2 viral copies this assay can detect is 250  copies / mL. A negative result does not preclude SARS-CoV-2 infection  and should not be used as the sole basis for treatment or other  patient management decisions.   A negative result may occur with  improper specimen collection / handling, submission of specimen other  than nasopharyngeal swab, presence of viral mutation(s) within the  areas targeted by this assay, and inadequate number of viral copies  (<250 copies / mL). A negative result must be combined with clinical  observations, patient history, and epidemiological information. If result is POSITIVE SARS-CoV-2 target nucleic acids are DETECTED. The SARS-CoV-2 RNA is generally detectable in upper and lower  respiratory specimens dur ing the acute phase of infection.  Positive  results are indicative of active infection with SARS-CoV-2.  Clinical  correlation with patient history and other diagnostic information is  necessary to determine patient infection status.  Positive results do  not rule out bacterial infection or co-infection with other viruses. If result is PRESUMPTIVE POSTIVE SARS-CoV-2 nucleic acids MAY BE PRESENT.   A presumptive positive result was obtained on the submitted specimen  and confirmed on repeat testing.  While 2019 novel coronavirus  (SARS-CoV-2) nucleic acids may be present in the submitted sample  additional confirmatory testing may be necessary for epidemiological  and / or clinical management purposes  to differentiate between  SARS-CoV-2 and other Sarbecovirus currently known to infect humans.  If clinically indicated additional testing with an alternate test  methodology [phone]) is advised. The SARS-CoV-2 RNA is generally  detectable in upper and lower respiratory sp ecimens during the acute  phase of infection. The expected result is Negative. Fact Sheet for Patients:  StrictlyIdeas.no Fact Sheet for Healthcare Providers: BankingDealers.co.za This test is not yet approved or cleared by the [place] FDA and has been authorized for detection and/or diagnosis of SARS-CoV-2 by FDA under an Emergency Use  Authorization (EUA).  This EUA will remain in effect (meaning this test can be used) for the duration of the COVID-19 declaration under Section 564(b)(1) of the Act, 21 U.S.C. section 360bbb-3(b)(1), unless the authorization is terminated or revoked sooner. Performed at [facility], [place] [address] [place], [place] [address]   Culture, blood (routine x 2)     Status: None (Preliminary result)   Collection Time: 09/19/19 11:22 PM   Specimen: BLOOD  Result Value Ref Range Status   Specimen Description BLOOD PORTA CATH  Final   Special Requests   Final    BOTTLES DRAWN AEROBIC AND ANAEROBIC Blood Culture adequate volume   Culture   Final    NO GROWTH 2 DAYS Performed at [facility], [place] [address], [place],  [address]    Report Status PENDING  Incomplete  Studies: No results found.  Scheduled Meds: . [START ON 09/23/2019] calcium-vitamin D  1 tablet Oral 3 times weekly  . Chlorhexidine Gluconate Cloth  6 each Topical Daily  . cycloSPORINE  1 drop Both Eyes BID  . [START ON 09/22/2019] docusate sodium  100 mg Oral BID  . ezetimibe  10 mg Oral Daily  . magic mouthwash w/lidocaine  10 mL Oral QID  . megestrol  400 mg Oral BID  . pantoprazole  40 mg Oral QPC supper  . predniSONE  5 mg Oral BID WC  . rivaroxaban  20 mg Oral Q supper  . tamsulosin  0.4 mg Oral Daily    Continuous Infusions: . sodium chloride 75 mL/hr at 09/21/19 1500  . lactated ringers 75 mL/hr at 09/21/19 1736     LOS: 1 day     Berle Mull, MD [facility]  If 7PM-7AM, please contact night-coverage [email or web address] 09/21/2019, 6:27 PM

## 2019-09-22 LAB — CBC WITH DIFFERENTIAL/PLATELET
Abs Immature Granulocytes: 0.51 10*3/uL — ABNORMAL HIGH (ref 0.00–0.07)
Basophils Absolute: 0.1 10*3/uL (ref 0.0–0.1)
Basophils Relative: 1 %
Eosinophils Absolute: 0 10*3/uL (ref 0.0–0.5)
Eosinophils Relative: 1 %
HCT: 23.6 % — ABNORMAL LOW (ref 39.0–52.0)
Hemoglobin: 7.2 g/dL — ABNORMAL LOW (ref 13.0–17.0)
Immature Granulocytes: 7 %
Lymphocytes Relative: 3 %
Lymphs Abs: 0.2 10*3/uL — ABNORMAL LOW (ref 0.7–4.0)
MCH: 28.7 pg (ref 26.0–34.0)
MCHC: 30.5 g/dL (ref 30.0–36.0)
MCV: 94 fL (ref 80.0–100.0)
Monocytes Absolute: 0.6 10*3/uL (ref 0.1–1.0)
Monocytes Relative: 8 %
Neutro Abs: 6.1 10*3/uL (ref 1.7–7.7)
Neutrophils Relative %: 80 %
Platelets: 99 10*3/uL — ABNORMAL LOW (ref 150–400)
RBC: 2.51 MIL/uL — ABNORMAL LOW (ref 4.22–5.81)
RDW: 19.4 % — ABNORMAL HIGH (ref 11.5–15.5)
WBC: 7.5 10*3/uL (ref 4.0–10.5)
nRBC: 0.3 % — ABNORMAL HIGH (ref 0.0–0.2)

## 2019-09-22 LAB — COMPREHENSIVE METABOLIC PANEL
ALT: 13 U/L (ref 0–44)
AST: 18 U/L (ref 15–41)
Albumin: 2.3 g/dL — ABNORMAL LOW (ref 3.5–5.0)
Alkaline Phosphatase: 49 U/L (ref 38–126)
Anion gap: 6 (ref 5–15)
BUN: 10 mg/dL (ref 8–23)
CO2: 19 mmol/L — ABNORMAL LOW (ref 22–32)
Calcium: 7.1 mg/dL — ABNORMAL LOW (ref 8.9–10.3)
Chloride: 111 mmol/L (ref 98–111)
Creatinine, Ser: 0.66 mg/dL (ref 0.61–1.24)
GFR calc Af Amer: >60 mL/min (ref >60)
GFR calc non Af Amer: >60 mL/min (ref >60)
Glucose, Bld: 85 mg/dL (ref 70–99)
Potassium: 3.3 mmol/L — ABNORMAL LOW (ref 3.5–5.1)
Sodium: 136 mmol/L (ref 135–145)
Total Bilirubin: 0.6 mg/dL (ref 0.3–1.2)
Total Protein: 4.4 g/dL — ABNORMAL LOW (ref 6.5–8.1)

## 2019-09-22 LAB — URINE CULTURE: Culture: 30000 — AB

## 2019-09-22 LAB — MAGNESIUM: Magnesium: 2 mg/dL (ref 1.7–2.4)

## 2019-09-22 LAB — ABO/RH: ABO/RH(D): O POS

## 2019-09-22 MED ORDER — POTASSIUM CHLORIDE CRYS ER 20 MEQ PO TBCR
40.0000 meq | EXTENDED_RELEASE_TABLET | ORAL | Status: AC
  Administered 2019-09-22: 40 meq via ORAL
  Filled 2019-09-22: qty 2

## 2019-09-22 MED ORDER — PROCHLORPERAZINE EDISYLATE 10 MG/2ML IJ SOLN
10.0000 mg | Freq: Four times a day (QID) | INTRAMUSCULAR | Status: DC | PRN
  Administered 2019-09-22 (×2): 10 mg via INTRAVENOUS
  Filled 2019-09-22 (×2): qty 2

## 2019-09-22 MED ORDER — MORPHINE SULFATE (PF) 2 MG/ML IV SOLN
1.0000 mg | INTRAVENOUS | Status: DC | PRN
  Administered 2019-09-22: 21:00:00 2 mg via INTRAVENOUS
  Administered 2019-09-23: 1 mg via INTRAVENOUS
  Filled 2019-09-22 (×2): qty 1

## 2019-09-22 MED ORDER — PROMETHAZINE HCL 25 MG/ML IJ SOLN
12.5000 mg | Freq: Four times a day (QID) | INTRAMUSCULAR | Status: DC | PRN
  Administered 2019-09-22: 12.5 mg via INTRAVENOUS
  Filled 2019-09-22: qty 1

## 2019-09-22 MED ORDER — MIRTAZAPINE 15 MG PO TABS
7.5000 mg | ORAL_TABLET | Freq: Every day | ORAL | Status: DC
  Administered 2019-09-23: 7.5 mg via ORAL
  Filled 2019-09-22: qty 1

## 2019-09-22 MED ORDER — BOOST / RESOURCE BREEZE PO LIQD CUSTOM: 1.0000 | Freq: Three times a day (TID) | ORAL | Status: DC

## 2019-09-22 NOTE — Progress Notes (Signed)
Will alert MD to continued tachycardia. Pt continues to vomit, which increases the HR even more. Medication for nausea given. Monitoring continues.

## 2019-09-22 NOTE — Progress Notes (Signed)
OT NOTE   Home hospice DME needs: electric hospital bed with ability to elevate with air mattress overlay, lift recliner, and home aide. Formal OT evaluation to follow.   Jeri Modena, OTR/L  Acute Rehabilitation Services Pager: [phone] Office: [phone] .

## 2019-09-22 NOTE — Progress Notes (Addendum)
Received referral from Dr. [place] to assist pt with hospice. Met with pt and wife. They agreed with home hospice. She reports that she doesn't have a preference for a hospice agency. Wife reports that they live in [place]. Discussed [place] and she agrees with agency. Contacted Lisa at [facility] for referral.

## 2019-09-22 NOTE — Progress Notes (Signed)
MD aware of continued tachycardia.  Pt continues to vomit, which increases the HR even more. Alerted MD of pt request to change antiemetic medication.  Will continue to monitor.  Current heart rate: 122 bpm.

## 2019-09-22 NOTE — Progress Notes (Signed)
[id number] [facility]) Hospital Liaison  RN note  Notified by Elmarie Shiley, [facility] , of family request for [facility] services at home after discharge. Chart and pt information under review by [facility] physician. Hospice eligibility pending at this time.  Writer spoke with the pt's wife [name] [phone]) to initiate education related to hospice philosophy, services and team approach to care. Family verbalized understanding of information given. Per discussion, plan is for discharge to home by private vehicle on 09/23/2019.  Please send signed and completed out of facility DNR form home with patient/family.  Patient will need prescriptions for discharge comfort medications.   DME needs discussed. Patient currently already has the following equipment: Wheel-chair, walker, bedside commode and a shower chair. Family requests the following equipment for delivery to the home Hospital bed with full rales and bedside table. [place] equipment specialist has been verified and is correct in the chart. [name] (wife) is the family contact to arrange time of equipment delivery.  [facility] aware of the above. Please fax completed discharge summary to [facility] at [phone] when final. Please notify [place] when patient is ready to leave the unit at discharge. (Call [phone] between 8:30-5 pm. Call [phone] after 5 pm.) [facility] information and contact numbers provided to [name] (wife) during today's call. Above information shared with Elmarie Shiley, RNCM.  Please call with hospice related questions.  Thank You for this referral.  Zachary Martini, RN, BSN [facility] Liaison [phone]  [place] are listed on AMION.

## 2019-09-22 NOTE — Progress Notes (Signed)
PROGRESS NOTE    MAIZE RECKARD  ZOX:[id number] DOB: [birth year] DOA: 09/19/2019 PCP: Merri Brunette, MD    Brief Narrative:   Mack Hook a 78 y.o.malewithhistory of metastatic prostate cancer currently receiving radiation and chemo. Had radiation therapy 09/19/19, and after going home patient started feeling very weak and short of breath. He was not able to even walk few steps because of the shortness of breath and weakness. As per patient's wife patient has not been eating well last 1 week. Has not had any nausea vomiting diarrhea fever or chills.   In the ED, patient was very tachycardic with heart rate in the 140s. Afebrile. Labs showed pancytopenia. Patient was started on sepsis protocol, fluids and blood cultures were obtained but no antibiotics were started since patient was not febrile. COVID-19 test was negative. UA shows nothing acute. Patient admitted for generalized weakness with tachycardia  Assessment & Plan:   Principal Problem:   Weakness generalized Active Problems:   Prostate cancer metastatic to multiple sites [facility])   Sinus tachycardia   Neutropenia (HCC)   Thrombocytopenia (HCC)   Generalized weakness   SIRS Patient presenting to ED with progressive weakness, fatigue found to have tachycardia, neutropenia on presentation.  He is currently on chemotherapy and afebrile.  CT angiogram chest no clear evidence of pneumonia or pulmonary embolism.  Urinalysis unrevealing.  COVID-19/SARS-CoV-2 negative.  TSH within normal limits.   --Blood cultures x2 no growth x3 days --Unlikely infectious etiology, suspect dehydration/deconditioning in the setting of active chemotherapy and progressive decline --Continue to hold on antibiotics without clear source of infection --Continue to monitor fever curve  Advanced metastatic prostate cancer Spoke with Dr. Clelia Croft on 09/20/2019 who agreed to not starting antibiotics, but acknowledged low threshold to start if  patient becomes febrile. Follows with Dr. Kathrynn Running for radiation to the spine, last dose of radiation was supposed to be on 09/20/2019, Dr. Clelia Croft recommended holding off for now Cancer appears to be significantly advanced with multiple necrotic nodes as well as encasement of left internal carotid artery. --Discussed with patient and spouse at bedside, wish to discuss with hospice services today --Case management for hospice referral  Generalized weakness, deconditioning, debility Likely due to possible progression of disease in addition to poor oral intake. Sepsis work-up unrevealing. --PT recommends home health, case management for assistance  Pancytopenia Afebrile, likely 2/2 chemotherapy, hemoglobin 7.2.  Iron 55, TIBC 189, ferritin 468; consistent with anemia of chronic disease.  --Neutropenic precautions --CBC daily  Diarrhea. Likely in the setting of chemotherapy as well as taking stool softener. Patient is at risk for C. difficile infection and has reported diarrhea prior to admission.   --Currently no signs and symptoms of C. difficile. --Monitor closely.   DVT prophylaxis: Xarelto Code Status: DNR, verified with patient and spouse as he has a living well Family Communication: Spouse present at bedside Disposition Plan: Continue inpatient, IV antiemetics, patient and spouse wish to consult hospice care given his significant disease burden and progressive decline, case management made aware.   Consultants:   Palliative care  Dr. Allena Katz discussed case with Dr. Clelia Croft on 09/20/2019  Procedures:  None  Antimicrobials:   none   Subjective: Patient seen and examined at bedside, continues with nausea and episode of vomiting this morning.  Otherwise with continued generalized weakness and fatigue.  Also with poor appetite and poor oral intake that has been progressing.  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  Given patient's severe debility from chemotherapy and lack of response, discussed with them possible transitioning to more of a comfort measures with hospice.  They are in agreement with meeting with hospice services today.  Case management consulted and aware.  No other concerns or complaints at this time.  Patient denies chest pain, no shortness of breath, no abdominal pain.  No acute events overnight per nursing staff.  Objective: Vitals:   09/22/19 0400 09/22/19 0622 09/22/19 0702 09/22/19 1033  BP:  117/64    Pulse: 90 (!) 113 (!) 104   Resp:  20    Temp:  97.6 F (36.4 C)    TempSrc:  Oral    SpO2:  99% 96% 97%  Weight:  84.1 kg      Intake/Output Summary (Last 24 hours) at 09/22/2019 1157 Last data filed at 09/22/2019 1026 Gross per 24 hour  Intake 1640.13 ml  Output 200 ml  Net 1440.13 ml   Filed Weights   09/21/19 0500 09/22/19 0622  Weight: 82.2 kg 84.1 kg    Examination:  General exam: Appears calm and comfortable, thin and cachectic in appearance, pale in appearance Respiratory system: Clear to auscultation. Respiratory effort normal. Cardiovascular system: S1 & S2 heard, RRR. No JVD, murmurs, rubs, gallops or clicks. No pedal edema. Gastrointestinal system: Abdomen is nondistended, soft and nontender. No organomegaly or masses felt. Normal bowel sounds heard. Central nervous system: Alert and oriented. No focal neurological deficits. Extremities: Symmetric 5 x 5 power. Skin: No rashes, lesions or ulcers Psychiatry: Judgement and insight appear normal. Mood & affect appropriate.     Data Reviewed: I have personally reviewed following labs and imaging studies  CBC: Recent Labs  Lab 09/19/19 2205 09/20/19 0455 09/22/19 0420  WBC 0.6* 0.6* 7.5  NEUTROABS 0.3* 0.4* 6.1  HGB 9.2* 7.8* 7.2*  HCT 29.6* 24.6* 23.6*  MCV 94.0 92.8 94.0  PLT 128* 99* 99*    Basic Metabolic Panel: Recent Labs  Lab 09/19/19 2205 09/20/19 0455 09/21/19 1038 09/22/19 0420  NA 133* 133* 132* 136  K 3.9 3.6 3.0* 3.3*  CL 106 107 107 111  CO2 18* 19* 17* 19*  GLUCOSE 116* 100* 100* 85  BUN 14 12 11 10   CREATININE 0.77 0.66 0.55* 0.66  CALCIUM 7.9* 7.6* 7.0* 7.1*  MG  --   --   --  2.0   GFR: Estimated Creatinine Clearance: 82.4 mL/min (by C-G formula based on SCr of 0.66 mg/dL). Liver Function Tests: Recent Labs  Lab 09/19/19 2205 09/20/19 0455 09/22/19 0420  AST 24 19 18   ALT 15 14 13   ALKPHOS 59 54 49  BILITOT 0.9 0.7 0.6  PROT 5.1* 4.3* 4.4*  ALBUMIN 2.7* 2.3* 2.3*   No results for input(s): LIPASE, AMYLASE in the last 168 hours. No results for input(s): AMMONIA in the last 168 hours. Coagulation Profile: No results for input(s): INR, PROTIME in the last 168 hours. Cardiac Enzymes: No results for input(s): CKTOTAL, CKMB, CKMBINDEX, TROPONINI in the last 168 hours. BNP (last 3 results) No results for input(s): PROBNP in the last 8760 hours. HbA1C: No results for input(s): HGBA1C in the last 72 hours. CBG: No results for input(s): GLUCAP in the last 168 hours. Lipid Profile: No results for input(s): CHOL, HDL, LDLCALC, TRIG, CHOLHDL, LDLDIRECT in the last 72 hours. Thyroid Function Tests: Recent Labs    09/20/19 0455  TSH 0.894   Anemia Panel: Recent Labs    09/20/19 1430  VITAMINB12 821  FOLATE 8.8  FERRITIN 468*  TIBC 189*  IRON 55   Sepsis Labs: Recent Labs  Lab 09/19/19 2305 09/20/19 0355  LATICACIDVEN 1.9 1.4    Recent Results (from the past 240 hour(s))  Culture, Urine     Status: Abnormal   Collection Time: 09/19/19 10:00 PM   Specimen: Urine, Random  Result Value Ref Range Status   Specimen Description URINE, RANDOM  Final   Special Requests   Final    NONE Performed at [facility], [address] [place], [place] [address]    Culture (A)  Final    30,000 COLONIES/mL MULTIPLE SPECIES PRESENT,  SUGGEST RECOLLECTION   Report Status 09/22/2019 FINAL  Final  Culture, blood (routine x 2)     Status: None (Preliminary result)   Collection Time: 09/19/19 10:38 PM   Specimen: BLOOD RIGHT HAND  Result Value Ref Range Status   Specimen Description BLOOD RIGHT HAND  Final   Special Requests   Final    BOTTLES DRAWN AEROBIC AND ANAEROBIC Blood Culture results may not be optimal due to an inadequate volume of blood received in culture bottles   Culture   Final    NO GROWTH 3 DAYS Performed at [facility], [address], [place], [place] [address]    Report Status PENDING  Incomplete  SARS Coronavirus 2 [facility] order, Performed in [facility] hospital lab) Nasopharyngeal Nasopharyngeal Swab     Status: None   Collection Time: 09/19/19 10:50 PM   Specimen: Nasopharyngeal Swab  Result Value Ref Range Status   SARS Coronavirus 2 NEGATIVE NEGATIVE Final    Comment: (NOTE) If result is NEGATIVE SARS-CoV-2 target nucleic acids are NOT DETECTED. The SARS-CoV-2 RNA is generally detectable in upper and lower  respiratory specimens during the acute phase of infection. The lowest  concentration of SARS-CoV-2 viral copies this assay can detect is 250  copies / mL. A negative result does not preclude SARS-CoV-2 infection  and should not be used as the sole basis for treatment or other  patient management decisions.  A negative result may occur with  improper specimen collection / handling, submission of specimen other  than nasopharyngeal swab, presence of viral mutation(s) within the  areas targeted by this assay, and inadequate number of viral copies  (<250 copies / mL). A negative result must be combined with clinical  observations, patient history, and epidemiological information. If result is POSITIVE SARS-CoV-2 target nucleic acids are DETECTED. The SARS-CoV-2 RNA is generally detectable in upper and lower  respiratory specimens dur ing the acute phase of infection.  Positive   results are indicative of active infection with SARS-CoV-2.  Clinical  correlation with patient history and other diagnostic information is  necessary to determine patient infection status.  Positive results do  not rule out bacterial infection or co-infection with other viruses. If result is PRESUMPTIVE POSTIVE SARS-CoV-2 nucleic acids MAY BE PRESENT.   A presumptive positive result was obtained on the submitted specimen  and confirmed on repeat testing.  While 2019 novel coronavirus  (SARS-CoV-2) nucleic acids may be present in the submitted sample  additional confirmatory testing may be necessary for epidemiological  and / or clinical management purposes  to differentiate between  SARS-CoV-2 and other Sarbecovirus currently known to infect humans.  If clinically indicated additional testing with an alternate test  methodology [phone]) is advised. The  SARS-CoV-2 RNA is generally  detectable in upper and lower respiratory sp ecimens during the acute  phase of infection. The expected result is Negative. Fact Sheet for Patients:  BoilerBrush.com.cy Fact Sheet for Healthcare Providers: https://pope.com/ This test is not yet approved or cleared by the [place] FDA and has been authorized for detection and/or diagnosis of SARS-CoV-2 by FDA under an Emergency Use Authorization (EUA).  This EUA will remain in effect (meaning this test can be used) for the duration of the COVID-19 declaration under Section 564(b)(1) of the Act, 21 U.S.C. section 360bbb-3(b)(1), unless the authorization is terminated or revoked sooner. Performed at [facility], [address], [place], [place] [address]   Culture, blood (routine x 2)     Status: None (Preliminary result)   Collection Time: 09/19/19 11:22 PM   Specimen: BLOOD  Result Value Ref Range Status   Specimen Description BLOOD PORTA CATH  Final   Special Requests   Final    BOTTLES DRAWN  AEROBIC AND ANAEROBIC Blood Culture adequate volume   Culture   Final    NO GROWTH 3 DAYS Performed at [facility], [address] [place], [place] [address]    Report Status PENDING  Incomplete         Radiology Studies: No results found.      Scheduled Meds: . [START ON 09/23/2019] calcium-vitamin D  1 tablet Oral 3 times weekly  . Chlorhexidine Gluconate Cloth  6 each Topical Daily  . cycloSPORINE  1 drop Both Eyes BID  . docusate sodium  100 mg Oral BID  . ezetimibe  10 mg Oral Daily  . magic mouthwash w/lidocaine  10 mL Oral QID  . megestrol  400 mg Oral BID  . pantoprazole  40 mg Oral QPC supper  . potassium chloride  40 mEq Oral Q3H  . predniSONE  5 mg Oral BID WC  . rivaroxaban  20 mg Oral Q supper  . tamsulosin  0.4 mg Oral Daily   Continuous Infusions: . sodium chloride 75 mL/hr at 09/21/19 1500  . lactated ringers 75 mL/hr at 09/22/19 0400     LOS: 2 days    Time spent: 32 minutes spent on chart review, discussion with nursing staff, consultants, updating family and interview/physical exam; more than 50% of that time was spent in counseling and/or coordination of care.    Alvira Philips [place], DO [facility] Pager [phone]  If 7PM-7AM, please contact night-coverage [email or web address] Password [email or web address] 09/22/2019, 11:57 AM

## 2019-09-22 NOTE — Progress Notes (Signed)
Initial Nutrition Assessment  INTERVENTION:   -Boost Breeze po TID, each supplement provides 250 kcal and 9 grams of protein  NUTRITION DIAGNOSIS:   Increased nutrient needs related to cancer and cancer related treatments as evidenced by estimated needs.  GOAL:   Patient will meet greater than or equal to 90% of their needs  MONITOR:   PO intake, Supplement acceptance, Labs, Weight trends, I & O's, GOC  REASON FOR ASSESSMENT:   Consult Assessment of nutrition requirement/status  ASSESSMENT:   78 y.o. 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 Had radiation therapy 09/19/19, and after going home patient started feeling very weak and short of breath.  He was not able to even walk few steps because of the shortness of breath and weakness.  As per patient's wife patient has not been eating well last 1 week.Patient admitted for generalized weakness with tachycardia  **RD working remotely**  Patient reports having poor appetite and intakes related to chemo and radiation for metastatic prostate cancer.  Pt and pt's family now considering hospice with comfort focus. Will continue to monitor [place]. Will order Boost Breeze supplements but question if pt will feel like drinking today as he has began having N/V.   Per weight records, pt weighed 174 lbs on 9/24. Weight for this admission was 181 lbs. I/Os: +2.4L since admit  Medications: Magic Mouthwash w/ lidocaine, Megace, KLOR-CON, Lactated Ringers infusion, Zofran PRN, Compazine  Labs reviewed:  Low K  NUTRITION - FOCUSED PHYSICAL EXAM:  Unable to perform -working remotely.  Diet Order:   Diet Order            Diet regular Room service appropriate? Yes; Fluid consistency: Thin  Diet effective now              EDUCATION NEEDS:   Not appropriate for education at this time  Skin:  Skin Assessment: Reviewed RN Assessment  Last BM:  10/4  Height:   Ht Readings from Last 1  Encounters:  09/12/19 5\' 11"  (1.803 m)    Weight:   Wt Readings from Last 1 Encounters:  09/22/19 84.1 kg    Ideal Body Weight:  78.2 kg  BMI:  Body mass index is 25.86 kg/m.  Estimated Nutritional Needs:   Kcal:  2200-2400  Protein:  115-125g  Fluid:  2.2L/day  Clayton Bibles, MS, RD, LDN Inpatient Clinical Dietitian Pager: [phone] After Hours Pager: [phone]

## 2019-09-22 NOTE — Evaluation (Signed)
Occupational Therapy Evaluation Patient Details Name: [name] MRN: [id number] DOB: [birth year] Today's Date: 09/22/2019    History of Present Illness DEVYNE WADDING is a 78 y.o. 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 Had radiation therapy 09/19/19, and after going home patient started feeling very weak and short of breath.  He was not able to even walk few steps because of the shortness of breath and weakness.   Clinical Impression   PT admitted with tachycardia and CA. Pt currently with functional limitiations due to the deficits listed below (see OT problem list). Pt currently could benefit from hospice providing aide for bathing 2 times per week, hospital bed that is electric, air mattress overlay and lift chair.  Pt will benefit from skilled OT to increase their independence and safety with adls and balance to allow discharge home.Wife could benefit from additional information regarding hiring aides for home care.      Follow Up Recommendations  Other (comment)(hospice)    Equipment Recommendations  Other (comment);Hospital bed(lift chair, air mattress elevating hospital bed )    Recommendations for Other Services       Precautions / Restrictions Precautions Precautions: Fall;Other (comment) Precaution Comments: monitor HR      Mobility Bed Mobility Overal bed mobility: Needs Assistance Bed Mobility: Supine to Sit     Supine to sit: Min assist;HOB elevated     General bed mobility comments: pt nauseated and needed increase time. pt able to progress to eob. pt standing with RW to step toward hob. pt requires min (A) with lifting bil LE on to bed  Transfers Overall transfer level: Needs assistance Equipment used: Rolling walker (2 wheeled) Transfers: Sit to/from Stand Sit to Stand: Min assist         General transfer comment: (A) to power up from bed surface    Balance Overall balance assessment: Needs  assistance           Standing balance-Leahy Scale: Fair                             ADL either performed or assessed with clinical judgement   ADL Overall ADL's : Needs assistance/impaired   Eating/Feeding Details (indicate cue type and reason): nauseated this session Grooming: Wash/dry face;Set up;Bed level   Upper Body Bathing: Moderate assistance   Lower Body Bathing: Maximal assistance   Upper Body Dressing : Moderate assistance   Lower Body Dressing: Maximal assistance   Toilet Transfer: Moderate assistance;BSC;Stand-pivot Toilet Transfer Details (indicate cue type and reason): pt requires bil feet blocked due to extension with standing Toileting- Clothing Manipulation and Hygiene: Total assistance Toileting - Clothing Manipulation Details (indicate cue type and reason): educated on making sure skin is dry before applying barrier cream. pt iwth early signs of skin break down and c/o of soreness.       General ADL Comments: pt and wife with question regarding d/c home with hospice needs. answering all questions asked. pt and wife asking about medication needs and encouraged to talk to MD regarding d/c of any medications that are directly helping patient currently. wife asking about port site and encouraged to talk to hospice about needs and wishes regarding any needs for port. wife and pt educated about positioning in the bed to decr risk for skin break down on buttock     Vision         Perception  Praxis      Pertinent Vitals/Pain Pain Assessment: No/denies pain     Hand Dominance Right   Extremity/Trunk Assessment Upper Extremity Assessment Upper Extremity Assessment: Generalized weakness   Lower Extremity Assessment Lower Extremity Assessment: Generalized weakness   Cervical / Trunk Assessment Cervical / Trunk Assessment: Normal   Communication Communication Communication: No difficulties   Cognition Arousal/Alertness:  Awake/alert Behavior During Therapy: WFL for tasks assessed/performed Overall Cognitive Status: Within Functional Limits for tasks assessed                                     General Comments  skin break down at buttock    Exercises     Shoulder Instructions      Home Living Family/patient expects to be discharged to:: Private residence Living Arrangements: Spouse/significant other Available Help at Discharge: Family;Available 24 hours/day Type of Home: House Home Access: Ramped entrance     Home Layout: One level     Bathroom Shower/Tub: Occupational psychologist: Handicapped height Bathroom Accessibility: Yes How Accessible: Accessible via walker Home Equipment: [place] - 2 wheels;Cane - single point;Wheelchair - manual;Bedside commode;Grab bars - tub/shower;Grab bars - toilet;Shower seat;Other (comment)          Prior Functioning/Environment Level of Independence: Independent with assistive device(s)        Comments: recent increase in use of w/c due to weakness        OT Problem List: Decreased strength;Decreased activity tolerance;Impaired balance (sitting and/or standing);Decreased safety awareness;Decreased knowledge of use of DME or AE;Decreased knowledge of precautions;Pain      OT Treatment/Interventions: Self-care/ADL training;Therapeutic exercise;Neuromuscular education;Energy conservation;DME and/or AE instruction;Manual therapy;Modalities;Therapeutic activities;Patient/family education;Balance training    OT Goals(Current goals can be found in the care plan section) Acute Rehab OT Goals Patient Stated Goal: home OT Goal Formulation: With patient/family Time For Goal Achievement: 10/06/19 Potential to Achieve Goals: Good  OT Frequency: Min 2X/week   Barriers to D/C:            Co-evaluation              AM-PAC OT "6 Clicks" Daily Activity     Outcome Measure Help from another person eating meals?: A Little Help  from another person taking care of personal grooming?: A Lot Help from another person toileting, which includes using toliet, bedpan, or urinal?: A Lot Help from another person bathing (including washing, rinsing, drying)?: A Lot Help from another person to put on and taking off regular upper body clothing?: A Lot Help from another person to put on and taking off regular lower body clothing?: Total 6 Click Score: 12   End of Session Equipment Utilized During Treatment: Gait belt;Rolling walker Nurse Communication: Mobility status;Precautions  Activity Tolerance: Patient tolerated treatment well Patient left: in bed;with call bell/phone within reach;with bed alarm set;with family/visitor present  OT Visit Diagnosis: Unsteadiness on feet (R26.81);Muscle weakness (generalized) (M62.81)                Time: [id number] OT Time Calculation (min): 32 min Charges:  OT General Charges $OT Visit: 1 Visit OT Evaluation $OT Eval Moderate Complexity: 1 Mod OT Treatments $Self Care/Home Management : 8-22 mins   Jeri Modena, OTR/L  Acute Rehabilitation Services Pager: [phone] Office: [phone] .   Jeri Modena 09/22/2019, 1:48 PM

## 2019-09-23 ENCOUNTER — Ambulatory Visit: Payer: Medicare Other

## 2019-09-23 LAB — BASIC METABOLIC PANEL
Anion gap: 9 (ref 5–15)
BUN: 13 mg/dL (ref 8–23)
CO2: 18 mmol/L — ABNORMAL LOW (ref 22–32)
Calcium: 7.2 mg/dL — ABNORMAL LOW (ref 8.9–10.3)
Chloride: 111 mmol/L (ref 98–111)
Creatinine, Ser: 0.64 mg/dL (ref 0.61–1.24)
GFR calc Af Amer: >60 mL/min (ref >60)
GFR calc non Af Amer: >60 mL/min (ref >60)
Glucose, Bld: 99 mg/dL (ref 70–99)
Potassium: 4 mmol/L (ref 3.5–5.1)
Sodium: 138 mmol/L (ref 135–145)

## 2019-09-23 LAB — CBC
HCT: 26.3 % — ABNORMAL LOW (ref 39.0–52.0)
Hemoglobin: 8.1 g/dL — ABNORMAL LOW (ref 13.0–17.0)
MCH: 29.1 pg (ref 26.0–34.0)
MCHC: 30.8 g/dL (ref 30.0–36.0)
MCV: 94.6 fL (ref 80.0–100.0)
Platelets: 120 10*3/uL — ABNORMAL LOW (ref 150–400)
RBC: 2.78 MIL/uL — ABNORMAL LOW (ref 4.22–5.81)
RDW: 19.7 % — ABNORMAL HIGH (ref 11.5–15.5)
WBC: 11.9 10*3/uL — ABNORMAL HIGH (ref 4.0–10.5)
nRBC: 0.3 % — ABNORMAL HIGH (ref 0.0–0.2)

## 2019-09-23 LAB — MAGNESIUM: Magnesium: 2 mg/dL (ref 1.7–2.4)

## 2019-09-23 MED ORDER — PROMETHAZINE HCL 25 MG RE SUPP
25.0000 mg | Freq: Four times a day (QID) | RECTAL | Status: DC | PRN
  Administered 2019-09-23 – 2019-09-24 (×2): 25 mg via RECTAL
  Filled 2019-09-23 (×2): qty 1

## 2019-09-23 MED ORDER — PANTOPRAZOLE SODIUM 40 MG PO PACK
40.0000 mg | PACK | Freq: Two times a day (BID) | ORAL | Status: DC
  Administered 2019-09-23 – 2019-09-24 (×2): 40 mg via ORAL
  Filled 2019-09-23 (×3): qty 20

## 2019-09-23 MED ORDER — DEXAMETHASONE SODIUM PHOSPHATE 10 MG/ML IJ SOLN
4.0000 mg | Freq: Two times a day (BID) | INTRAMUSCULAR | Status: DC
  Administered 2019-09-23 – 2019-09-24 (×3): 4 mg via INTRAVENOUS
  Filled 2019-09-23 (×3): qty 1

## 2019-09-23 MED ORDER — FENTANYL 25 MCG/HR TD PT72
1.0000 | MEDICATED_PATCH | TRANSDERMAL | Status: DC
  Administered 2019-09-23: 1 via TRANSDERMAL
  Filled 2019-09-23: qty 1

## 2019-09-23 NOTE — Progress Notes (Signed)
PMT no charge note  Chart reviewed, patient and wife finally resting with eyes closed, patient with nausea vomiting earlier today. Med history noted, agree with DNR DNI, home with hospice and current symptom management regimen.   Loistine Chance MD [place] palliative team.

## 2019-09-23 NOTE — Progress Notes (Signed)
This shift pt unable to tolerate any PO meds. Text paged attending for IV pain mediation. New orders given, will continue to monitor

## 2019-09-23 NOTE — TOC Progression Note (Signed)
Transition of Care [facility]) - Progression Note    Patient Details  Name: [name] MRN: [id number] Date of Birth: [birth year]  [facility]) CM/SW [place],  Phone Number: [phone] 09/23/2019, 12:02 PM  Clinical Narrative:    Pt and wife planning for pt to have home hospice care at DC. Selected [facility] ([facility] who is following. Today, pt and wife shared questions with hospice liaison and with CSW re: home medications. Are concerned that pt "has been too nauseous to take his PO medications." Inquiring if, if pt needs IV medications or fluids at home if this is managed/qualifies for hospice care. CSW and liaison explained that typically IV medications and especially fluids at home are not part of hospice plan. Pt/wife asking how to manage dehydration at home- CSW advised to speak with providers to better understand what to expect if pt unable to tolerate PO intake.       Expected Discharge Plan and Services    Home- hospice                       Social Determinants of Health (SDOH) Interventions    Readmission Risk Interventions No flowsheet data found.

## 2019-09-23 NOTE — Progress Notes (Signed)
Per floor nurse patient does not wish to receive radiation therapy today. Informed Heather, RT on L1 of this finding.

## 2019-09-23 NOTE — Progress Notes (Addendum)
PROGRESS NOTE    [name]  NWG:[id number] DOB: 01-27-1941 DOA: 09/19/2019 PCP: Merri Brunette, MD    Brief Narrative:   [name] a 78 y.o.malewithhistory of metastatic prostate cancer currently receiving radiation and chemo. Had radiation therapy 09/19/19, and after going home patient started feeling very weak and short of breath. He was not able to even walk few steps because of the shortness of breath and weakness. As per patient's wife patient has not been eating well last 1 week. Has not had any nausea vomiting diarrhea fever or chills.   In the ED, patient was very tachycardic with heart rate in the 140s. Afebrile. Labs showed pancytopenia. Patient was started on sepsis protocol, fluids and blood cultures were obtained but no antibiotics were started since patient was not febrile. COVID-19 test was negative. UA shows nothing acute. Patient admitted for generalized weakness with tachycardia  Assessment & Plan:   Principal Problem:   Weakness generalized Active Problems:   Prostate cancer metastatic to multiple sites [facility])   Sinus tachycardia   Neutropenia (HCC)   Thrombocytopenia (HCC)   Generalized weakness   SIRS Patient presenting to ED with progressive weakness, fatigue found to have tachycardia, neutropenia on presentation.  He is currently on chemotherapy and afebrile.  CT angiogram chest no clear evidence of pneumonia or pulmonary embolism.  Urinalysis unrevealing.  COVID-19/SARS-CoV-2 negative.  TSH within normal limits.   --Blood cultures x2 no growth x3 days --Unlikely infectious etiology, suspect dehydration/deconditioning in the setting of active chemotherapy and progressive decline --Continue to hold on antibiotics without clear source of infection --Continue to monitor fever curve  Advanced metastatic prostate cancer Spoke with Dr. Clelia Croft on 09/20/2019 who agreed to not starting antibiotics, but acknowledged low threshold to start if  patient becomes febrile. Follows with Dr. Kathrynn Running for radiation to the spine, last dose of radiation was supposed to be on 09/20/2019, Dr. Clelia Croft recommended holding off for now Cancer appears to be significantly advanced with multiple necrotic nodes as well as encasement of left internal carotid artery. --Not tolerating oral medicines well, will try fentanyl patch 25 mcg today --Continue oral oxycodone prn if tolerates --Plan to transition to home hospice  Nausea / Vomiting High suspicion for chemo/radiation induced.  Has been using Compazine at home, ineffective now.  Also no effect with IV Zofran/Phenergan. --Start Phenergan suppository --Decadron 4 mg IV twice daily --Supportive care --May consider sublingual Ativan low-dose if continues with issues  Generalized weakness, deconditioning, debility Likely due to possible progression of disease in addition to poor oral intake. Sepsis work-up unrevealing. --PT recommends home health, now transitioning to home hospice --Continue NS at 75 mL's per hour for now  Pancytopenia Afebrile, likely 2/2 chemotherapy, hemoglobin 7.2.  Iron 55, TIBC 189, ferritin 468; consistent with anemia of chronic disease.  --Neutropenic precautions --CBC daily  Diarrhea: Resolved Likely in the setting of chemotherapy as well as taking stool softener. Patient is at risk for C. difficile infection and has reported diarrhea prior to admission.   --Currently no signs and symptoms of C. difficile. --Monitor closely.  GERD: Continue PPI   DVT prophylaxis: Xarelto Code Status: DNR, verified with patient and spouse as he has a living well Family Communication: Spouse present at bedside Disposition Plan: Continue inpatient, IV antiemetics, patient and spouse wish to consult hospice care given his significant disease burden and progressive decline, case management made aware.   Consultants:   Palliative care  Dr. Allena Katz discussed case with Dr. Clelia Croft on  09/20/2019  Procedures:  None  Antimicrobials:   none   Subjective: Patient seen and examined at bedside, continues with nausea and vomiting resistant to IV/p.o. Zofran, Phenergan, and Compazine.  Continues with poor oral intake.  Diarrhea has resolved.  Patient having home hospice services set up with equipment.  Discussed with spouse at bedside, will attempt Phenergan suppository and IV Decadron to see if has any effect.  No other concerns or complaints at this time.  Patient denies chest pain, no shortness of breath, no abdominal pain.  No acute events overnight per nursing staff.  Objective: Vitals:   09/22/19 1825 09/22/19 2128 09/23/19 0600 09/23/19 0708  BP:  111/68 119/73   Pulse: (!) 110 (!) 108 (!) 130 (!) 110  Resp:  17 20   Temp:  97.7 F (36.5 C) 98.3 F (36.8 C)   TempSrc:  Oral Oral   SpO2: 99% 99% 98%   Weight:   82.7 kg     Intake/Output Summary (Last 24 hours) at 09/23/2019 1020 Last data filed at 09/23/2019 0300 Gross per 24 hour  Intake 1574.13 ml  Output 300 ml  Net 1274.13 ml   Filed Weights   09/21/19 0500 09/22/19 0622 09/23/19 0600  Weight: 82.2 kg 84.1 kg 82.7 kg    Examination:  General exam: Appears calm and comfortable, pale, thin and cachectic in appearance Respiratory system: Clear to auscultation. Respiratory effort normal. Cardiovascular system: S1 & S2 heard, RRR. No JVD, murmurs, rubs, gallops or clicks. No pedal edema.  Port noted in place, currently accessed Gastrointestinal system: Abdomen is nondistended, soft and nontender. No organomegaly or masses felt. Normal bowel sounds heard. Central nervous system: Alert and oriented. No focal neurological deficits. Extremities: Symmetric 5 x 5 power. Skin: No rashes, lesions or ulcers Psychiatry: Judgement and insight appear normal. Mood & affect appropriate.     Data Reviewed: I have personally reviewed following labs and imaging studies  CBC: Recent Labs  Lab 09/19/19 2205  09/20/19 0455 09/22/19 0420 09/23/19 0407  WBC 0.6* 0.6* 7.5 11.9*  NEUTROABS 0.3* 0.4* 6.1  --   HGB 9.2* 7.8* 7.2* 8.1*  HCT 29.6* 24.6* 23.6* 26.3*  MCV 94.0 92.8 94.0 94.6  PLT 128* 99* 99* 120*   Basic Metabolic Panel: Recent Labs  Lab 09/19/19 2205 09/20/19 0455 09/21/19 1038 09/22/19 0420 09/23/19 0407  NA 133* 133* 132* 136 138  K 3.9 3.6 3.0* 3.3* 4.0  CL 106 107 107 111 111  CO2 18* 19* 17* 19* 18*  GLUCOSE 116* 100* 100* 85 99  BUN 14 12 11 10 13   CREATININE 0.77 0.66 0.55* 0.66 0.64  CALCIUM 7.9* 7.6* 7.0* 7.1* 7.2*  MG  --   --   --  2.0 2.0   GFR: Estimated Creatinine Clearance: 82.4 mL/min (by C-G formula based on SCr of 0.64 mg/dL). Liver Function Tests: Recent Labs  Lab 09/19/19 2205 09/20/19 0455 09/22/19 0420  AST 24 19 18   ALT 15 14 13   ALKPHOS 59 54 49  BILITOT 0.9 0.7 0.6  PROT 5.1* 4.3* 4.4*  ALBUMIN 2.7* 2.3* 2.3*   No results for input(s): LIPASE, AMYLASE in the last 168 hours. No results for input(s): AMMONIA in the last 168 hours. Coagulation Profile: No results for input(s): INR, PROTIME in the last 168 hours. Cardiac Enzymes: No results for input(s): CKTOTAL, CKMB, CKMBINDEX, TROPONINI in the last 168 hours. BNP (last 3 results) No results for input(s): PROBNP in the last 8760 hours. HbA1C: No results  for input(s): HGBA1C in the last 72 hours. CBG: No results for input(s): GLUCAP in the last 168 hours. Lipid Profile: No results for input(s): CHOL, HDL, LDLCALC, TRIG, CHOLHDL, LDLDIRECT in the last 72 hours. Thyroid Function Tests: No results for input(s): TSH, T4TOTAL, FREET4, T3FREE, THYROIDAB in the last 72 hours. Anemia Panel: Recent Labs    09/20/19 1430  VITAMINB12 821  FOLATE 8.8  FERRITIN 468*  TIBC 189*  IRON 55   Sepsis Labs: Recent Labs  Lab 09/19/19 2305 09/20/19 0355  LATICACIDVEN 1.9 1.4    Recent Results (from the past 240 hour(s))  Culture, Urine     Status: Abnormal   Collection Time: 09/19/19  10:00 PM   Specimen: Urine, Random  Result Value Ref Range Status   Specimen Description URINE, RANDOM  Final   Special Requests   Final    NONE Performed at [facility], [address] [place], [place] [address]    Culture (A)  Final    30,000 COLONIES/mL MULTIPLE SPECIES PRESENT, SUGGEST RECOLLECTION   Report Status 09/22/2019 FINAL  Final  Culture, blood (routine x 2)     Status: None (Preliminary result)   Collection Time: 09/19/19 10:38 PM   Specimen: BLOOD RIGHT HAND  Result Value Ref Range Status   Specimen Description BLOOD RIGHT HAND  Final   Special Requests   Final    BOTTLES DRAWN AEROBIC AND ANAEROBIC Blood Culture results may not be optimal due to an inadequate volume of blood received in culture bottles   Culture   Final    NO GROWTH 4 DAYS Performed at [facility], [address], [place], [place] [address]    Report Status PENDING  Incomplete  SARS Coronavirus 2 [facility] order, Performed in [facility] hospital lab) Nasopharyngeal Nasopharyngeal Swab     Status: None   Collection Time: 09/19/19 10:50 PM   Specimen: Nasopharyngeal Swab  Result Value Ref Range Status   SARS Coronavirus 2 NEGATIVE NEGATIVE Final    Comment: (NOTE) If result is NEGATIVE SARS-CoV-2 target nucleic acids are NOT DETECTED. The SARS-CoV-2 RNA is generally detectable in upper and lower  respiratory specimens during the acute phase of infection. The lowest  concentration of SARS-CoV-2 viral copies this assay can detect is 250  copies / mL. A negative result does not preclude SARS-CoV-2 infection  and should not be used as the sole basis for treatment or other  patient management decisions.  A negative result may occur with  improper specimen collection / handling, submission of specimen other  than nasopharyngeal swab, presence of viral mutation(s) within the  areas targeted by this assay, and inadequate number of viral copies  (<250 copies / mL). A negative result must  be combined with clinical  observations, patient history, and epidemiological information. If result is POSITIVE SARS-CoV-2 target nucleic acids are DETECTED. The SARS-CoV-2 RNA is generally detectable in upper and lower  respiratory specimens dur ing the acute phase of infection.  Positive  results are indicative of active infection with SARS-CoV-2.  Clinical  correlation with patient history and other diagnostic information is  necessary to determine patient infection status.  Positive results do  not rule out bacterial infection or co-infection with other viruses. If result is PRESUMPTIVE POSTIVE SARS-CoV-2 nucleic acids MAY BE PRESENT.   A presumptive positive result was obtained on the submitted specimen  and confirmed on repeat testing.  While 2019 novel coronavirus  (SARS-CoV-2) nucleic acids may be present in  the submitted sample  additional confirmatory testing may be necessary for epidemiological  and / or clinical management purposes  to differentiate between  SARS-CoV-2 and other Sarbecovirus currently known to infect humans.  If clinically indicated additional testing with an alternate test  methodology [phone]) is advised. The SARS-CoV-2 RNA is generally  detectable in upper and lower respiratory sp ecimens during the acute  phase of infection. The expected result is Negative. Fact Sheet for Patients:  BoilerBrush.com.cy Fact Sheet for Healthcare Providers: https://pope.com/ This test is not yet approved or cleared by the [place] FDA and has been authorized for detection and/or diagnosis of SARS-CoV-2 by FDA under an Emergency Use Authorization (EUA).  This EUA will remain in effect (meaning this test can be used) for the duration of the COVID-19 declaration under Section 564(b)(1) of the Act, 21 U.S.C. section 360bbb-3(b)(1), unless the authorization is terminated or revoked sooner. Performed at [facility], [address] [place], [place] [address]   Culture, blood (routine x 2)     Status: None (Preliminary result)   Collection Time: 09/19/19 11:22 PM   Specimen: BLOOD  Result Value Ref Range Status   Specimen Description BLOOD PORTA CATH  Final   Special Requests   Final    BOTTLES DRAWN AEROBIC AND ANAEROBIC Blood Culture adequate volume   Culture   Final    NO GROWTH 4 DAYS Performed at [facility], [address], [place], [place] [address]    Report Status PENDING  Incomplete         Radiology Studies: No results found.      Scheduled Meds:  calcium-vitamin D  1 tablet Oral 3 times weekly   Chlorhexidine Gluconate Cloth  6 each Topical Daily   cycloSPORINE  1 drop Both Eyes BID   dexamethasone (DECADRON) injection  4 mg Intravenous Q12H   docusate sodium  100 mg Oral BID   ezetimibe  10 mg Oral Daily   feeding supplement  1 Container Oral TID BM   fentaNYL  1 patch Transdermal Q72H   magic mouthwash w/lidocaine  10 mL Oral QID   megestrol  400 mg Oral BID   mirtazapine  7.5 mg Oral QHS   pantoprazole  40 mg Oral QPC supper   rivaroxaban  20 mg Oral Q supper   tamsulosin  0.4 mg Oral Daily   Continuous Infusions:  sodium chloride 75 mL/hr at 09/21/19 1500   lactated ringers 75 mL/hr at 09/23/19 0300     LOS: 3 days    Time spent: 31 minutes spent on chart review, discussion with nursing staff, consultants, updating family and interview/physical exam; more than 50% of that time was spent in counseling and/or coordination of care.    Zachary Philips [place], DO [facility] Pager [phone]  If 7PM-7AM, please contact night-coverage [email or web address] Password [email or web address] 09/23/2019, 10:20 AM

## 2019-09-23 NOTE — Care Management Important Message (Signed)
Important Message  Patient Details IM Letter given to [name] SW to present to the Patient Name: [name] MRN: [id number] Date of Birth: [birth year]   Medicare Important Message Given:  Yes     Kerin Salen 09/23/2019, 10:37 AM

## 2019-09-23 NOTE — Progress Notes (Signed)
PT Cancellation Note  Patient Details Name: [name] MRN: [id number] DOB: Oct 18, 1941   Cancelled Treatment:    Reason Eval/Treat Not Completed: Patient initially agreed to get up to recliner via SPT.  As PT was getting everything ready, pt changed his mind stating, he had just eaten and when he moves after he eats he tends to vomit.  Reviewed DME with pt. Recommend hospital bed. Noted pt now going home with hospice which is new since PT eval.  Will check back as schedule permits.   Galen Manila 09/23/2019, 9:32 AM

## 2019-09-23 NOTE — Progress Notes (Signed)
Manufacturing engineer [facility])  Contacted wife and patient this am to discuss going home with hospice.  Continued education and hospice philosophy conversation and what to expect, discussed IV fluids at home and that is not generally in the plan of care for hospice.  Pt symptoms are still not optimally managed well enough to go home, as there could be a lapse in discharge and hospice services being started.    DME to be delivered today.   [facility] will continue to assist with d/c planning.  Venia Carbon RN, BSN, [facility] Liaison (in [place]) [phone]

## 2019-09-24 ENCOUNTER — Ambulatory Visit: Payer: Medicare Other

## 2019-09-24 ENCOUNTER — Telehealth: Payer: Self-pay

## 2019-09-24 LAB — CULTURE, BLOOD (ROUTINE X 2)
Culture: NO GROWTH
Culture: NO GROWTH
Special Requests: ADEQUATE

## 2019-09-24 MED ORDER — FENTANYL 25 MCG/HR TD PT72: 1.0000 | MEDICATED_PATCH | TRANSDERMAL | 0 refills | Status: AC

## 2019-09-24 MED ORDER — MIRTAZAPINE 7.5 MG PO TABS: 7.5000 mg | ORAL_TABLET | Freq: Every day | ORAL | 0 refills | Status: AC

## 2019-09-24 MED ORDER — PANTOPRAZOLE SODIUM 40 MG PO PACK: 40.0000 mg | PACK | Freq: Two times a day (BID) | ORAL | 0 refills | Status: AC

## 2019-09-24 MED ORDER — PROMETHAZINE HCL 25 MG RE SUPP: 25.0000 mg | Freq: Four times a day (QID) | RECTAL | 2 refills | Status: AC | PRN

## 2019-09-24 MED ORDER — HEPARIN SOD (PORK) LOCK FLUSH 100 UNIT/ML IV SOLN
500.0000 [IU] | INTRAVENOUS | Status: AC | PRN
  Administered 2019-09-24: 500 [IU]
  Filled 2019-09-24: qty 5

## 2019-09-24 MED ORDER — DEXAMETHASONE 0.5 MG/5ML PO SOLN: 4.0000 mg | Freq: Two times a day (BID) | ORAL | 0 refills | Status: AC

## 2019-09-24 NOTE — Telephone Encounter (Signed)
-----   Message from [name], MD sent at 09/24/2019 11:24 AM EDT ----- Yes. Agree with these suggestions. Thanks.  ----- Message ----- From: Scot Dock, RN Sent: 09/24/2019  11:16 AM EDT To: [name], MD  Pt spouse stated that patient still in the hospital and incredibly weak. She stated that he does not want to pursue any further treatment. She does not feel that she can transport him to his appt on 10:15 at 8:30 am. Should the MD visit be changed to a phone visit and cancel the lab-flush-infusion appt's?

## 2019-09-24 NOTE — Progress Notes (Signed)
PT Cancellation Note  Patient Details Name: [name] MRN: [id number] DOB: 30-Nov-1941   Cancelled Treatment:    Reason Eval/Treat Not Completed: Other (comment);  Pt going home with Hospice. PT signing off    [facility] 09/24/2019, 12:01 PM

## 2019-09-24 NOTE — Discharge Summary (Signed)
Physician Discharge Summary  [name] [id number] DOB: 01-08-1941 DOA: 09/19/2019  PCP: Zachary Pretty, MD  Admit date: 09/19/2019 Discharge date: 09/24/2019  Admitted From: Home Disposition:  Home with Hospice  Recommendations for Outpatient Follow-up:  1. Follow up with PCP in 1 week 2. Follow-up with oncology, Dr. Alen [name] as scheduled 3. Newly started on Protonix 40 mg p.o. twice daily oral solution, fentanyl patch, Phenergan suppository prn, and Decadron 4 mg p.o. twice daily oral solution  Home Health: No Equipment/Devices: Hospital bed, bedside table  Discharge Condition: Stable CODE STATUS: DNR Diet recommendation: Heart Healthy  History of present illness:  [name] a 78 y.o.malewithhistory of metastatic prostate cancer currently receiving radiation and chemo. Had radiation therapy 09/19/19, and after going home patient started feeling very weak and short of breath. He was not able to even walk few steps because of the shortness of breath and weakness. As per patient's wife patient has not been eating well last 1 week. Has not had any nausea vomiting diarrhea fever or chills.   In the ED, patient was very tachycardic with heart rate in the 140s. Afebrile. Labs showed pancytopenia. Patient was started on sepsis protocol, fluids and blood cultures were obtained but no antibiotics were started since patient was not febrile. COVID-19 test was negative. UA shows nothing acute. Patient admitted for generalized weakness with tachycardia  Hospital course:  SIRS Patient presenting to ED with progressive weakness, fatigue found to have tachycardia, neutropenia on presentation.  He is currently on chemotherapy and afebrile.  CT angiogram chest no clear evidence of pneumonia or pulmonary embolism.  Urinalysis unrevealing.  COVID-19/SARS-CoV-2 negative.  TSH within normal limits.    Blood cultures did not show any growth during hospitalization. Unlikely infectious  etiology, suspect dehydration/deconditioning in the setting of active chemotherapy and progressive decline.  Now discharging home with home hospice services.  Advanced metastatic prostate cancer Spoke with Dr. Alen [name] on 09/20/2019 who agreed to not starting antibiotics, but acknowledged low threshold to start if patient becomes febrile. Follows with Dr. Tammi [name] for radiation to the spine, last dose of radiation was supposed to be on 09/20/2019, Dr. Alen [name] recommended holding off for now Cancer appears to be significantly advanced with multiple necrotic nodes as well as encasement of left internal carotid artery.  Patient was afebrile during the entirety of his hospitalization.  Patient not tolerating oral medicines very well.  Discussed with patient and spouse regarding likely further decline secondary to advancing cancer.  Patient was started on a fentanyl patch and continued oral oxycodone as needed.  Palliative care and hospice were consulted and Patient will be returning to home with home hospice services.  Nausea / Vomiting High suspicion for chemo/radiation induced.  Has been using Compazine at home, ineffective now.  Patient also had very minimal effect with IV Zofran and Phenergan.  Patient did receive some relief with Phenergan suppository and changing prednisone to Decadron 4 mg twice daily.  Patient will discharge home with Phenergan suppository prescription as well as continue Decadron 4 mg p.o. twice daily oral solution.  Continue to encourage increased oral intake.  Megace for appetite stimulant.   Generalized weakness, deconditioning, debility Likely due to possible progression of disease in addition to poor oral intake. Sepsis work-up unrevealing. Physical therapy recommends home health, but now transitioning to home hospice  GERD: Continue PPI  Discharge Diagnoses:  Principal Problem:   Weakness generalized Active Problems:   Prostate cancer metastatic to multiple sites [facility])    Sinus  tachycardia   Neutropenia (HCC)   Thrombocytopenia (HCC)   Generalized weakness    Discharge Instructions  Discharge Instructions    Call MD for:  difficulty breathing, headache or visual disturbances   Complete by: As directed    Call MD for:  extreme fatigue   Complete by: As directed    Call MD for:  persistant dizziness or light-headedness   Complete by: As directed    Call MD for:  persistant nausea and vomiting   Complete by: As directed    Call MD for:  severe uncontrolled pain   Complete by: As directed    Call MD for:  temperature >100.4   Complete by: As directed    Diet - low sodium heart healthy   Complete by: As directed    Increase activity slowly   Complete by: As directed      Allergies as of 09/24/2019      Reactions   Statins Other (See Comments)   Muscle pain      Medication List    STOP taking these medications   pantoprazole 40 MG tablet Commonly known as: PROTONIX Replaced by: pantoprazole sodium 40 mg/20 mL Pack   predniSONE 5 MG tablet Commonly known as: DELTASONE     TAKE these medications   acetaminophen 500 MG tablet Commonly known as: TYLENOL Take 500 mg by mouth daily. Pt takes 5 days after chemo   antiseptic oral rinse Liqd 15 mLs by Mouth Rinse route as needed for dry mouth.   CALCIUM 1000 + D PO Take 30 mLs by mouth 3 (three) times a week. Calcium 1000 mg / Vit D 1000 iu   Claritin 10 MG Caps Generic drug: Loratadine Take 10 mg by mouth daily. Patient takes every day for 5 days after chemo treatment.   cycloSPORINE 0.05 % ophthalmic emulsion Commonly known as: RESTASIS Place 1 drop into both eyes 2 (two) times daily.   dexamethasone 0.5 MG/5ML solution Commonly known as: DECADRON Take 40 mLs (4 mg total) by mouth 2 (two) times daily.   docusate sodium 100 MG capsule Commonly known as: COLACE Take 100 mg by mouth 2 (two) times daily.   ezetimibe 10 MG tablet Commonly known as: ZETIA Take 10 mg by mouth  daily.   fentaNYL 25 MCG/HR Commonly known as: Dry Prong 1 patch onto the skin every 3 (three) days. Start taking on: September 26, 2019   ibuprofen 200 MG tablet Commonly known as: ADVIL Take 200 mg by mouth every 6 (six) hours as needed for moderate pain.   leuprolide (6 Month) 45 MG injection Commonly known as: ELIGARD Inject 45 mg into the skin every 6 (six) months.   lidocaine-prilocaine cream Commonly known as: EMLA Apply 1 application topically as needed.   megestrol 400 MG/10ML suspension Commonly known as: MEGACE Take 10 mLs (400 mg total) by mouth 2 (two) times daily.   mirtazapine 7.5 MG tablet Commonly known as: REMERON Take 1 tablet (7.5 mg total) by mouth at bedtime.   ondansetron 4 MG tablet Commonly known as: ZOFRAN Take 4 mg by mouth every 8 (eight) hours as needed for nausea or vomiting.   oxyCODONE 5 MG immediate release tablet Commonly known as: Oxy IR/ROXICODONE Take 1-2 tablets (5-10 mg total) by mouth every 4 (four) hours as needed for severe pain.   pantoprazole sodium 40 mg/20 mL Pack Commonly known as: PROTONIX Take 20 mLs (40 mg total) by mouth 2 (two) times daily. Replaces: pantoprazole 40 MG  tablet   polyethylene glycol 17 g packet Commonly known as: MIRALAX / GLYCOLAX Take 17 g by mouth daily as needed for mild constipation.   prochlorperazine 10 MG tablet Commonly known as: COMPAZINE Take 1 tablet (10 mg total) by mouth every 6 (six) hours as needed for nausea or vomiting.   promethazine 25 MG suppository Commonly known as: Phenergan Place 1 suppository (25 mg total) rectally every 6 (six) hours as needed for nausea.   tamsulosin 0.4 MG Caps capsule Commonly known as: FLOMAX Take 0.4 mg by mouth daily.   traMADol 50 MG tablet Commonly known as: ULTRAM TAKE 1 TABLET BY MOUTH EVERY 6 HOURS AS NEEDED FOR PAIN What changed:   reasons to take this  additional instructions   triamcinolone cream 0.1 % Commonly known as:  KENALOG Apply 1 application topically daily as needed (dry skin in the winter).   Xarelto 20 MG Tabs tablet Generic drug: rivaroxaban TAKE 1 TABLET (20 MG TOTAL) BY MOUTH DAILY WITH SUPPER FOR 30 DAYS. What changed: See the new instructions.       Allergies  Allergen Reactions  . Statins Other (See Comments)    Muscle pain    Consultations:  Palliative care  Hospice   Procedures/Studies: Dg Chest 2 View  Result Date: 09/19/2019 CLINICAL DATA:  Prostate cancer on chemo radiation, acute onset shortness of breath EXAM: CHEST - 2 VIEW COMPARISON:  CT 08/20/2019, radiograph 05/17/2019 FINDINGS: Right IJ approach Port-A-Cath tip terminates near the superior cavoatrial junction. Stable bandlike areas of scarring with basilar areas of atelectasis. No consolidation, features of edema, pneumothorax, or effusion. The cardiomediastinal contours are unremarkable. Degenerative changes are present in the imaged spine and shoulders. IMPRESSION: Bandlike areas of stable scarring. No acute cardiopulmonary disease. Electronically Signed   By: Lovena Le M.D.   On: 09/19/2019 22:45   Ct Angio Chest Pe W And/or Wo Contrast  Result Date: 09/20/2019 CLINICAL DATA:  PE suspected shortness of breath for 1 day, metastatic prostate cancer EXAM: CT ANGIOGRAPHY CHEST WITH CONTRAST TECHNIQUE: Multidetector CT imaging of the chest was performed using the standard protocol during bolus administration of intravenous contrast. Multiplanar CT image reconstructions and MIPs were obtained to evaluate the vascular anatomy. CONTRAST:  10mL OMNIPAQUE IOHEXOL 350 MG/ML SOLN COMPARISON:  CT chest, abdomen and pelvis 08/20/2019 FINDINGS: Cardiovascular: Study demonstrates preferential opacification of the thoracic aorta. Opacification of the coronary arteries is suboptimal but sufficient to exclude central and lobar filling defects. No visible pulmonary arterial filling defects are identified. There is mild narrowing of the  left and right upper lobar pulmonary arteries by the metastatic hilar nodal masses. The normal caliber thoracic aorta demonstrates atheromatous plaque but no acute luminal irregularity or periaortic stranding. Common origin of the brachiocephalic and left common carotid artery. Minimal plaque within the proximal great vessels. Cardiac size is within normal limits. Small volume of pericardial fluid is likely within physiologic normal, not significantly increased from prior exam. No frank pericardial effusion. Atherosclerotic calcification of the coronary arteries. Mediastinum/Nodes: Normal appearance of the thyroid gland. Trachea and esophagus are unremarkable. Reference nodes include: Grossly stable size and appearance of a large left supraclavicular nodal mass measuring 3 x 3.6 cm in size and encasing the left common carotid artery (7/9). Partially necrotic right hilar lymph node narrowing the central pulmonary artery measures 1.9 cm in size, unchanged from prior (7/64). Partially necrotic left hilar lymph node narrowing the left central pulmonary arteries measures 1.0 cm in size also unchanged (7/57). Left paratracheal  lymph node measures 11 mm, not significantly changed (7/55). Lungs/Pleura: Small bilateral pleural effusions with adjacent areas of ground-glass opacity likely reflecting atelectatic change. Stable appearance of a subpleural right upper lobe nodule measuring approximately 9 mm in size, not significantly changed (9/37). No new nodules or masses. No consolidative process. No pneumothorax. Upper Abdomen: Markedly nodular hepatic surface contour with multiple ill-defined hypoattenuating lesions throughout the liver parenchyma. Largest lesion in the posterior right lobe liver measures up to 5.2 cm in size but the overall margins of which are less clearly delineated on this phase of contrast enhancement. Partial visualization of a left renal cyst. No acute abnormalities present in the visualized portions  of the upper abdomen. Musculoskeletal: Bilateral gynecomastia. Multilevel degenerative changes are present in the imaged portions of the spine. No suspicious osseous lesions. Review of the MIP images confirms the above findings. IMPRESSION: 1. Suboptimal opacification of the pulmonary arteries. No evidence of central or lobar pulmonary embolism. 2. Grossly stable bilateral hilar and mediastinal adenopathy, consistent with metastatic disease, much of which is unchanged from staging CT 1 month prior. 3. Supraclavicular nodal mass with encasement of the left common carotid artery. 4. New bilateral pleural effusions with adjacent areas of ground-glass opacity likely reflecting atelectatic change. 5. Stable sub solid nodule in the right upper lobe. 6. Markedly nodular hepatic surface contour with multiple ill-defined hypoattenuating lesions throughout the liver parenchyma, consistent with known metastatic disease. 7. Aortic Atherosclerosis (ICD10-I70.0). Electronically Signed   By: Lovena Le M.D.   On: 09/20/2019 00:47   Mr Lumbar Spine W Wo Contrast  Result Date: 08/27/2019 CLINICAL DATA:  Low back pain and bilateral leg pain and weakness for 1 month. Metastatic prostate cancer to bone. EXAM: MRI LUMBAR SPINE WITHOUT AND WITH CONTRAST TECHNIQUE: Multiplanar and multiecho pulse sequences of the lumbar spine were obtained without and with intravenous contrast. CONTRAST:  26mL MULTIHANCE GADOBENATE DIMEGLUMINE 529 MG/ML IV SOLN COMPARISON:  MRI dated 04/26/2019 and CT scan of the abdomen and pelvis dated 08/20/2019 FINDINGS: Segmentation:  Standard. Alignment: Straightening of the normal lumbar lordosis. Retropulsion of the posterior margin of L3 into the spinal canal due to the pathologic fracture, unchanged. Vertebrae: Progressive metastatic lesions throughout the lumbar spine. Conus medullaris and cauda equina: Conus extends to the L1-2 level. Conus and cauda equina appear normal. Paraspinal and other soft  tissues: Progressive metastatic disease involving the left adrenal gland, periaortic lymph nodes, bilateral iliac chain lymph nodes, with progressive extension into the psoas muscles bilaterally. Disc levels: T12-L1: The disc is normal. Progression of the metastatic disease in the T12 vertebral body without extension into the spinal canal. No neural impingement. L1-2: Disc desiccation with a small broad-based disc bulge without neural impingement. Progression of metastatic disease in the L1 vertebral body without extension into the spinal canal. L2-3: Stable old pathologic fracture of L1 with protrusion of the posterior margin of L1 into the spinal canal. Previous left decompressive laminectomy. No new impingement upon the spinal canal. Chronic hypertrophy of the right facet joint compresses the right lateral recess, unchanged. L3-4: Small broad-based disc bulge with chronic hypertrophy of the ligamentum flavum and facet joints creating mild narrowing of the spinal canal as well as persistent right foraminal stenosis. Progression of metastatic disease in the L4 vertebral body. L4-5: Progressive compression fracture of the L4 vertebral body due to metastatic disease. Small broad-based disc bulge without change. Bilateral foraminal stenosis, unchanged. L5-S1: Normal disc. Progressive tumor in the L5 vertebra with increased pathologic compression of the vertebral  body. No neural impingement. IMPRESSION: 1. 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 2. No new neural impingement. No severe spinal stenosis. No appreciable tumor within the spinal canal. Electronically Signed   By: Lorriane Shire M.D.   On: 08/27/2019 09:43   US Biopsy (liver)  Result Date: 08/29/2019 INDICATION: 78 year old male with a history of liver metastatic disease, with a history of prostate carcinoma EXAM: ULTRASOUND-GUIDED BIOPSY LIVER MASS MEDICATIONS: None. ANESTHESIA/SEDATION:  Moderate (conscious) sedation was employed during this procedure. A total of Versed 1.0 mg and Fentanyl 50 mcg was administered intravenously. Moderate Sedation Time: 10 minutes. [name] level of consciousness and vital signs were monitored continuously by radiology nursing throughout the procedure under my direct supervision. FLUOROSCOPY TIME:  NONE COMPLICATIONS: NONE PROCEDURE: Informed written consent was obtained from the patient after a thorough discussion of the procedural risks, benefits and alternatives. All questions were addressed. Maximal Sterile Barrier Technique was utilized including caps, mask, sterile gowns, sterile gloves, sterile drape, hand hygiene and skin antiseptic. A timeout was performed prior to the initiation of the procedure. Patient positioned supine position on the ultrasound stretcher. Ultrasound images were acquired with images stored sent to PACs. Patient is prepped and draped in the usual sterile fashion. 1% lidocaine was used for local anesthesia. Using ultrasound guidance, 17 gauge guide needle was advanced to the margin of the dominant right liver lobe mass. Multiple 18 gauge core biopsy were acquired. Tissue specimen sent to pathology. Gel-Foam pledgets were infused with a small amount of saline. Needle was removed and a final image was stored. Sterile bandage was placed. Patient tolerated the procedure well and remained hemodynamically stable throughout. No complications were encountered and no significant blood loss. IMPRESSION: Status post ultrasound-guided biopsy of right liver mass. Tissue specimen sent to pathology for complete histopathologic analysis. Signed, Dulcy Fanny. Dellia Nims, RPVI [facility] Electronically Signed   By: Corrie Mckusick D.O.   On: 08/29/2019 14:50     Subjective: Patient seen and examined bedside, resting comfortably.  States nausea has slightly improved.  Has been able tolerate some  of his oral medicines over the past 24 hours.  States that fentanyl patch and Phenergan suppository working.  Also notes likely improvement with his appetite with Decadron.  Ready for discharge home as home equipment delivered by hospice services late last night.  No other complaints or concerns at this time. Patient denies chest pain, no shortness of breath, no abdominal pain.  No acute events overnight per nursing staff.   Discharge Exam: Vitals:   09/23/19 2311 09/24/19 0536  BP: 130/77 126/73  Pulse: (!) 125 (!) 122  Resp: 19 19  Temp: 98.5 F (36.9 C) 98.4 F (36.9 C)  SpO2: 97% 96%   Vitals:   09/23/19 1236 09/23/19 1837 09/23/19 2311 09/24/19 0536  BP: 127/72 (!) 151/83 130/77 126/73  Pulse: (!) 122 (!) 127 (!) 125 (!) 122  Resp: 16 18 19 19   Temp: 98.5 F (36.9 C) 98.7 F (37.1 C) 98.5 F (36.9 C) 98.4 F (36.9 C)  TempSrc: Oral     SpO2: 100% 99% 97% 96%  Weight:        General: Pt is alert, awake, not in acute distress, thin, pale, cachectic in appearance Cardiovascular: RRR, S1/S2 +, no rubs, no gallops, port noted in place Respiratory: CTA bilaterally, no wheezing, no rhonchi Abdominal: Soft, NT, ND, bowel sounds + Extremities: no edema, no  cyanosis    The results of significant diagnostics from this hospitalization (including imaging, microbiology, ancillary and laboratory) are listed below for reference.     Microbiology: Recent Results (from the past 240 hour(s))  Culture, Urine     Status: Abnormal   Collection Time: 09/19/19 10:00 PM   Specimen: Urine, Random  Result Value Ref Range Status   Specimen Description URINE, RANDOM  Final   Special Requests   Final    NONE Performed at [facility], [address] [place], [place] [address]    Culture (A)  Final    30,000 COLONIES/mL MULTIPLE SPECIES PRESENT, SUGGEST RECOLLECTION   Report Status 09/22/2019 FINAL  Final  Culture, blood (routine x 2)     Status: None   Collection Time: 09/19/19  10:38 PM   Specimen: BLOOD RIGHT HAND  Result Value Ref Range Status   Specimen Description BLOOD RIGHT HAND  Final   Special Requests   Final    BOTTLES DRAWN AEROBIC AND ANAEROBIC Blood Culture results may not be optimal due to an inadequate volume of blood received in culture bottles   Culture   Final    NO GROWTH 5 DAYS Performed at [facility], [place] [address], [place], [place] [address]    Report Status 09/24/2019 FINAL  Final  SARS Coronavirus 2 [facility] order, Performed in [facility] hospital lab) Nasopharyngeal Nasopharyngeal Swab     Status: None   Collection Time: 09/19/19 10:50 PM   Specimen: Nasopharyngeal Swab  Result Value Ref Range Status   SARS Coronavirus 2 NEGATIVE NEGATIVE Final    Comment: (NOTE) If result is NEGATIVE SARS-CoV-2 target nucleic acids are NOT DETECTED. The SARS-CoV-2 RNA is generally detectable in upper and lower  respiratory specimens during the acute phase of infection. The lowest  concentration of SARS-CoV-2 viral copies this assay can detect is 250  copies / mL. A negative result does not preclude SARS-CoV-2 infection  and should not be used as the sole basis for treatment or other  patient management decisions.  A negative result may occur with  improper specimen collection / handling, submission of specimen other  than nasopharyngeal swab, presence of viral mutation(s) within the  areas targeted by this assay, and inadequate number of viral copies  (<250 copies / mL). A negative result must be combined with clinical  observations, patient history, and epidemiological information. If result is POSITIVE SARS-CoV-2 target nucleic acids are DETECTED. The SARS-CoV-2 RNA is generally detectable in upper and lower  respiratory specimens dur ing the acute phase of infection.  Positive  results are indicative of active infection with SARS-CoV-2.  Clinical  correlation with patient history and other diagnostic information is  necessary to  determine patient infection status.  Positive results do  not rule out bacterial infection or co-infection with other viruses. If result is PRESUMPTIVE POSTIVE SARS-CoV-2 nucleic acids MAY BE PRESENT.   A presumptive positive result was obtained on the submitted specimen  and confirmed on repeat testing.  While 2019 novel coronavirus  (SARS-CoV-2) nucleic acids may be present in the submitted sample  additional confirmatory testing may be necessary for epidemiological  and / or clinical management purposes  to differentiate between  SARS-CoV-2 and other Sarbecovirus currently known to infect humans.  If clinically indicated additional testing with an alternate test  methodology [phone]) is advised. The SARS-CoV-2 RNA is generally  detectable in upper and lower respiratory sp ecimens during the acute  phase of infection.  The expected result is Negative. Fact Sheet for Patients:  StrictlyIdeas.no Fact Sheet for Healthcare Providers: BankingDealers.co.za This test is not yet approved or cleared by the [place] FDA and has been authorized for detection and/or diagnosis of SARS-CoV-2 by FDA under an Emergency Use Authorization (EUA).  This EUA will remain in effect (meaning this test can be used) for the duration of the COVID-19 declaration under Section 564(b)(1) of the Act, 21 U.S.C. section 360bbb-3(b)(1), unless the authorization is terminated or revoked sooner. Performed at [facility], [place] [address], [place], [place] [address]   Culture, blood (routine x 2)     Status: None   Collection Time: 09/19/19 11:22 PM   Specimen: BLOOD  Result Value Ref Range Status   Specimen Description BLOOD PORTA CATH  Final   Special Requests   Final    BOTTLES DRAWN AEROBIC AND ANAEROBIC Blood Culture adequate volume   Culture   Final    NO GROWTH 5 DAYS Performed at [facility], [place] [address], [place], [place] [address]     Report Status 09/24/2019 FINAL  Final     Labs: BNP (last 3 results) Recent Labs    09/19/19 2205  BNP 0000000   Basic Metabolic Panel: Recent Labs  Lab 09/19/19 2205 09/20/19 0455 09/21/19 1038 09/22/19 0420 09/23/19 0407  NA 133* 133* 132* 136 138  K 3.9 3.6 3.0* 3.3* 4.0  CL 106 107 107 111 111  CO2 18* 19* 17* 19* 18*  GLUCOSE 116* 100* 100* 85 99  BUN 14 12 11 10 13   CREATININE 0.77 0.66 0.55* 0.66 0.64  CALCIUM 7.9* 7.6* 7.0* 7.1* 7.2*  MG  --   --   --  2.0 2.0   Liver Function Tests: Recent Labs  Lab 09/19/19 2205 09/20/19 0455 09/22/19 0420  AST 24 19 18   ALT 15 14 13   ALKPHOS 59 54 49  BILITOT 0.9 0.7 0.6  PROT 5.1* 4.3* 4.4*  ALBUMIN 2.7* 2.3* 2.3*   No results for input(s): LIPASE, AMYLASE in the last 168 hours. No results for input(s): AMMONIA in the last 168 hours. CBC: Recent Labs  Lab 09/19/19 2205 09/20/19 0455 09/22/19 0420 09/23/19 0407  WBC 0.6* 0.6* 7.5 11.9*  NEUTROABS 0.3* 0.4* 6.1  --   HGB 9.2* 7.8* 7.2* 8.1*  HCT 29.6* 24.6* 23.6* 26.3*  MCV 94.0 92.8 94.0 94.6  PLT 128* 99* 99* 120*   Cardiac Enzymes: No results for input(s): CKTOTAL, CKMB, CKMBINDEX, TROPONINI in the last 168 hours. BNP: Invalid input(s): POCBNP CBG: No results for input(s): GLUCAP in the last 168 hours. D-Dimer No results for input(s): DDIMER in the last 72 hours. Hgb A1c No results for input(s): HGBA1C in the last 72 hours. Lipid Profile No results for input(s): CHOL, HDL, LDLCALC, TRIG, CHOLHDL, LDLDIRECT in the last 72 hours. Thyroid function studies No results for input(s): TSH, T4TOTAL, T3FREE, THYROIDAB in the last 72 hours.  Invalid input(s): FREET3 Anemia work up No results for input(s): VITAMINB12, FOLATE, FERRITIN, TIBC, IRON, RETICCTPCT in the last 72 hours. Urinalysis    Component Value Date/Time   COLORURINE AMBER (A) 09/19/2019 2200   APPEARANCEUR HAZY (A) 09/19/2019 2200   LABSPEC 1.027 09/19/2019 2200   PHURINE 5.0 09/19/2019  2200   GLUCOSEU NEGATIVE 09/19/2019 2200   HGBUR NEGATIVE 09/19/2019 2200   BILIRUBINUR SMALL (A) 09/19/2019 2200   KETONESUR 20 (A) 09/19/2019 2200   PROTEINUR 100 (A) 09/19/2019 2200   UROBILINOGEN 0.2  01/22/2015 0939   NITRITE NEGATIVE 09/19/2019 2200   LEUKOCYTESUR NEGATIVE 09/19/2019 2200   Sepsis Labs Invalid input(s): PROCALCITONIN,  WBC,  LACTICIDVEN Microbiology Recent Results (from the past 240 hour(s))  Culture, Urine     Status: Abnormal   Collection Time: 09/19/19 10:00 PM   Specimen: Urine, Random  Result Value Ref Range Status   Specimen Description URINE, RANDOM  Final   Special Requests   Final    NONE Performed at [facility], [address], [place], [place] [address]    Culture (A)  Final    30,000 COLONIES/mL MULTIPLE SPECIES PRESENT, SUGGEST RECOLLECTION   Report Status 09/22/2019 FINAL  Final  Culture, blood (routine x 2)     Status: None   Collection Time: 09/19/19 10:38 PM   Specimen: BLOOD RIGHT HAND  Result Value Ref Range Status   Specimen Description BLOOD RIGHT HAND  Final   Special Requests   Final    BOTTLES DRAWN AEROBIC AND ANAEROBIC Blood Culture results may not be optimal due to an inadequate volume of blood received in culture bottles   Culture   Final    NO GROWTH 5 DAYS Performed at [facility], [place] [address] [place], [place] [address]    Report Status 09/24/2019 FINAL  Final  SARS Coronavirus 2 [facility] order, Performed in [facility] hospital lab) Nasopharyngeal Nasopharyngeal Swab     Status: None   Collection Time: 09/19/19 10:50 PM   Specimen: Nasopharyngeal Swab  Result Value Ref Range Status   SARS Coronavirus 2 NEGATIVE NEGATIVE Final    Comment: (NOTE) If result is NEGATIVE SARS-CoV-2 target nucleic acids are NOT DETECTED. The SARS-CoV-2 RNA is generally detectable in upper and lower  respiratory specimens during the acute phase of infection. The lowest  concentration of SARS-CoV-2 viral copies this assay  can detect is 250  copies / mL. A negative result does not preclude SARS-CoV-2 infection  and should not be used as the sole basis for treatment or other  patient management decisions.  A negative result may occur with  improper specimen collection / handling, submission of specimen other  than nasopharyngeal swab, presence of viral mutation(s) within the  areas targeted by this assay, and inadequate number of viral copies  (<250 copies / mL). A negative result must be combined with clinical  observations, patient history, and epidemiological information. If result is POSITIVE SARS-CoV-2 target nucleic acids are DETECTED. The SARS-CoV-2 RNA is generally detectable in upper and lower  respiratory specimens dur ing the acute phase of infection.  Positive  results are indicative of active infection with SARS-CoV-2.  Clinical  correlation with patient history and other diagnostic information is  necessary to determine patient infection status.  Positive results do  not rule out bacterial infection or co-infection with other viruses. If result is PRESUMPTIVE POSTIVE SARS-CoV-2 nucleic acids MAY BE PRESENT.   A presumptive positive result was obtained on the submitted specimen  and confirmed on repeat testing.  While 2019 novel coronavirus  (SARS-CoV-2) nucleic acids may be present in the submitted sample  additional confirmatory testing may be necessary for epidemiological  and / or clinical management purposes  to differentiate between  SARS-CoV-2 and other Sarbecovirus currently known to infect humans.  If clinically indicated additional testing with an alternate test  methodology [id number]) is advised. The SARS-CoV-2 RNA is generally  detectable in upper and lower respiratory sp ecimens during the acute  phase of infection. The expected result  is Negative. Fact Sheet for Patients:  StrictlyIdeas.no Fact Sheet for Healthcare  Providers: BankingDealers.co.za This test is not yet approved or cleared by the [place] FDA and has been authorized for detection and/or diagnosis of SARS-CoV-2 by FDA under an Emergency Use Authorization (EUA).  This EUA will remain in effect (meaning this test can be used) for the duration of the COVID-19 declaration under Section 564(b)(1) of the Act, 21 U.S.C. section 360bbb-3(b)(1), unless the authorization is terminated or revoked sooner. Performed at [facility], [place] [address], [place], [place] [address]   Culture, blood (routine x 2)     Status: None   Collection Time: 09/19/19 11:22 PM   Specimen: BLOOD  Result Value Ref Range Status   Specimen Description BLOOD PORTA CATH  Final   Special Requests   Final    BOTTLES DRAWN AEROBIC AND ANAEROBIC Blood Culture adequate volume   Culture   Final    NO GROWTH 5 DAYS Performed at [facility], [place] [address], [place], [place] [address]    Report Status 09/24/2019 FINAL  Final     Time coordinating discharge: Over 30 minutes  SIGNED:    J [place] DO  [facility] 09/24/2019, 1:27 PM

## 2019-09-24 NOTE — Progress Notes (Signed)
Per conversation with wife, Bethena Roys, patient wishes to discontinue radiation therapy. Staff informed. Therapist on L1 will drop EOT.

## 2019-09-24 NOTE — TOC Transition Note (Signed)
Transition of Care [facility]) - CM/SW Discharge Note   Patient Details  Name: [name] MRN: [id number] Date of Birth: 11/07/1941  Transition of Care [facility]) CM/SW Contact:  Nila Nephew, LCSW Phone Number: [phone] 09/24/2019, 2:47 PM   Clinical Narrative:    Pt going home today at DC, hospice services with [facility] arranged and DME is at his home. [facility] transport pending.

## 2019-09-24 NOTE — Telephone Encounter (Signed)
Contacted patient spouse and made aware that the follow up visit with Dr. [name] on 10:15 at 8:30 am will be changed to a phone visit and the other appointments that day have been cancelled. Mrs. Siefring verbalized understanding and had no other questions or concerns.

## 2019-09-24 NOTE — Consult Note (Signed)
[facility] CM Inpatient Consult   09/24/2019  [name] 1941/03/22 [id number]    Patient screened if potential [facility] Care Management services needed bythisMedicare/ NextGen memberwith22% highrisk score for unplanned readmission and hospitalizations.  Per chart review andMD note showas follows: Patient presented to ED with progressive weakness, fatigue found to have tachycardia, neutropenia on presentation.  He is currently on chemotherapy and afebrile.  [SIRS, Advanced metastatic prostate cancer, Generalized weakness, deconditioning, debility likely due to possible progression of disease in addition to poor oral intake]  Patient's primarycareprovider isDr. Merri Brunette, with [facility], listed to provide transition of care.  Patient agreed with DNR/ DNI and plans to discharge home with hospice and current symptom management regimen, per Palliative Care note.  Review of transition of careSW note, states that patient and wife plan for Home Hospice care at discharge and selected [facility]- aware (per [facility]).   There are no [facility] follow-up needs at this point, patient will have full care under Hospice.    For questions and additional information, please call:  Sherilyn Windhorst A. Dorita Rowlands, BSN, RN-BC [facility] Liaison Cell: [phone]

## 2019-09-24 NOTE — Progress Notes (Signed)
Manufacturing engineer [facility])  Family reports DME was delivered last night.  [facility] will send RN to house tonight at 5 to begin services.  Please send completed DNR and any prescriptions needed for pain and nausea so there is no lapse as [facility] will be visiting at night.  Thank you, Venia Carbon RN, BSN, [facility] Liaison (in [place]) [phone]

## 2019-09-25 ENCOUNTER — Ambulatory Visit: Payer: Medicare Other

## 2019-10-03 ENCOUNTER — Ambulatory Visit: Payer: Medicare Other

## 2019-10-03 ENCOUNTER — Other Ambulatory Visit: Payer: Medicare Other

## 2019-10-03 ENCOUNTER — Inpatient Hospital Stay: Payer: Medicare Other | Attending: Oncology | Admitting: Oncology

## 2019-10-03 DIAGNOSIS — C61 Malignant neoplasm of prostate: Secondary | ICD-10-CM | POA: Diagnosis not present

## 2019-10-03 NOTE — Progress Notes (Signed)
Hematology and Oncology Follow Up for Telemedicine Visits  [name] [id number] Jun 05, 1941 78 y.o. 10/03/2019 8:19 AM [name], MDPharr, Thayer Jew, MD   I connected with [name] on 10/03/19 at  8:30 AM EDT by telephone visit and verified that I am speaking with the correct person using two identifiers.   I discussed the limitations, risks, security and privacy concerns of performing an evaluation and management service by telemedicine and the availability of in-person appointments. I also discussed with the patient that there may be a patient responsible charge related to this service. The patient expressed understanding and agreed to proceed.  Other persons participating in the visit and their role in the encounter:    Patient's location: Home Provider's location: Office    Principle Diagnosis: Principle Diagnosis: 78 year old man with prostate cancer diagnosed in 2017.  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  He has documented bone and hepatic metastasis.  Prior Therapy: He was treated there with external beam radiation and brachii therapy in January 2017 and completed 2 years of androgen deprivation. 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 Metastatic work-up at that time showed bone involvement as well as lymphadenopathy.   He was treated under the care of Dr. [name] at [facility] initially with enzalutamide started in May 2018 until January 2019 where he presented with progression of disease with bony metastasis, adrenal nodule as well as worsening adenopathy.   Taxotere chemotherapy initially at 60 mg/m and that was reduced to 45 mg/m after cycle 6. His last treatment was in July 2019 given at [facility]. Repeat imaging studies showed his lymphadenopathy has improved but he did  develop worsening bony metastasis based on a lumbar spine obtained on June 24, 2018. The MRI showed a complete replacement of L3 vertebral body with tumor the tumor in the ventral epidural space behind L3 greater to the left. Bulky retroperitoneal adenopathy was slightly improved as mentioned.   He is status post decompressive laminectomy and removal of L3 tumor completed on 08/06/2018.  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   Zytiga 1000 mg daily started in September 2019.  Therapy discontinued in May 2020 for progression of disease.  He is status post stereotactic radiosurgery to the L4 vertebral body completed on May 10, 2019.  Palliative radiation therapy to the lumbar spine he is to receive 30 Gy in 10 fractions to be completed by 09/20/2019.  Current therapy:  [name] chemotherapy 20 mg per metered square cycle 1 given on May 10, 2019.    He received cycle 7 with carboplatin on 09/12/2019.  Androgen deprivation under the care of Dr. [name].   Xgeva 120 mg every 6 weeks.  Last injection given on 08/23/2019.      Interim History: [name] reports overall feeling poorly.  He was hospitalized between October 1 and October 6 for failure to thrive and shortness of breath.  He has received supportive management and was discharged home with hospice care.  Since his discharge, he has been not confined to bed most of the day with overall generalized weakness and tiredness.  He does not report any excessive pain or discomfort.  He still have some hoarseness in his voice.     Medications: I have reviewed the patient's current  medications.  Current Outpatient Medications  Medication Sig Dispense Refill  . acetaminophen (TYLENOL) 500 MG tablet Take 500 mg by mouth daily. Pt takes 5 days after chemo    . antiseptic oral rinse (BIOTENE) LIQD 15 mLs by Mouth Rinse route as needed for dry mouth.    . Calcium  Carb-Cholecalciferol (CALCIUM 1000 + D PO) Take 30 mLs by mouth 3 (three) times a week. Calcium 1000 mg / Vit D 1000 iu     . cycloSPORINE (RESTASIS) 0.05 % ophthalmic emulsion Place 1 drop into both eyes 2 (two) times daily.    Marland Kitchen dexamethasone (DECADRON) 0.5 MG/5ML solution Take 40 mLs (4 mg total) by mouth 2 (two) times daily. 2400 mL 0  . docusate sodium (COLACE) 100 MG capsule Take 100 mg by mouth 2 (two) times daily.    Marland Kitchen ezetimibe (ZETIA) 10 MG tablet Take 10 mg by mouth daily.     . fentaNYL (DURAGESIC) 25 MCG/HR Place 1 patch onto the skin every 3 (three) days. 10 patch 0  . ibuprofen (ADVIL,MOTRIN) 200 MG tablet Take 200 mg by mouth every 6 (six) hours as needed for moderate pain.    Marland Kitchen leuprolide, 6 Month, (ELIGARD) 45 MG injection Inject 45 mg into the skin every 6 (six) months.    . lidocaine-prilocaine (EMLA) cream Apply 1 application topically as needed. 30 g 0  . Loratadine (CLARITIN) 10 MG CAPS Take 10 mg by mouth daily. Patient takes every day for 5 days after chemo treatment.    . megestrol (MEGACE) 400 MG/10ML suspension Take 10 mLs (400 mg total) by mouth 2 (two) times daily. 240 mL 0  . mirtazapine (REMERON) 7.5 MG tablet Take 1 tablet (7.5 mg total) by mouth at bedtime. 30 tablet 0  . ondansetron (ZOFRAN) 4 MG tablet Take 4 mg by mouth every 8 (eight) hours as needed for nausea or vomiting.    Marland Kitchen oxyCODONE (OXY IR/ROXICODONE) 5 MG immediate release tablet Take 1-2 tablets (5-10 mg total) by mouth every 4 (four) hours as needed for severe pain. 90 tablet 0  . pantoprazole sodium (PROTONIX) 40 mg/20 mL PACK Take 20 mLs (40 mg total) by mouth 2 (two) times daily. 1200 mL 0  . polyethylene glycol (MIRALAX / GLYCOLAX) packet Take 17 g by mouth daily as needed for mild constipation. 14 each 0  . prochlorperazine (COMPAZINE) 10 MG tablet Take 1 tablet (10 mg total) by mouth every 6 (six) hours as needed for nausea or vomiting. 30 tablet 0  . promethazine (PHENERGAN) 25 MG suppository  Place 1 suppository (25 mg total) rectally every 6 (six) hours as needed for nausea. 36 suppository 2  . tamsulosin (FLOMAX) 0.4 MG CAPS capsule Take 0.4 mg by mouth daily.     . traMADol (ULTRAM) 50 MG tablet TAKE 1 TABLET BY MOUTH EVERY 6 HOURS AS NEEDED FOR PAIN (Patient taking differently: Take 50 mg by mouth every 6 (six) hours as needed for moderate pain. ) 60 tablet 0  . triamcinolone cream (KENALOG) 0.1 % Apply 1 application topically daily as needed (dry skin in the winter).     Alveda Reasons 20 MG TABS tablet TAKE 1 TABLET (20 MG TOTAL) BY MOUTH DAILY WITH SUPPER FOR 30 DAYS. (Patient taking differently: Take 20 mg by mouth daily with supper. ) 90 tablet 1   No current facility-administered medications for this visit.      Allergies:  Allergies  Allergen Reactions  . Statins Other (See Comments)  Muscle pain       Lab Results: Lab Results  Component Value Date   WBC 11.9 (H) 09/23/2019   HGB 8.1 (L) 09/23/2019   HCT 26.3 (L) 09/23/2019   MCV 94.6 09/23/2019   PLT 120 (L) 09/23/2019     Chemistry      Component Value Date/Time   NA 138 09/23/2019 0407   K 4.0 09/23/2019 0407   CL 111 09/23/2019 0407   CO2 18 (L) 09/23/2019 0407   BUN 13 09/23/2019 0407   CREATININE 0.64 09/23/2019 0407   CREATININE 0.71 09/12/2019 0802      Component Value Date/Time   CALCIUM 7.2 (L) 09/23/2019 0407   ALKPHOS 49 09/22/2019 0420   AST 18 09/22/2019 0420   AST 26 09/12/2019 0802   ALT 13 09/22/2019 0420   ALT 12 09/12/2019 0802   BILITOT 0.6 09/22/2019 0420   BILITOT 0.4 09/12/2019 0802       Impression and Plan:  78 year old with:  1.  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  His last chemotherapy on 09/12/2019 was tolerated poorly and resulted in hospitalization.  The natural course of this disease was reviewed today and overall prognosis was discussed.  He is clearly approaching end-stage status and any  subsequent chemotherapy likely will not alter the trajectory of his disease and we will not offer any further palliation.  Based on these findings, I recommended proceeding with supportive management and discontinuation of all cancer treatment.  He is agreeable at this time.  2.  Prognosis: Poor overall with limited life expectancy.  He is currently under hospice care.  Anticipate less than 6 months of survival.    I discussed the assessment and treatment plan with the patient. The patient was provided an opportunity to ask questions and all were answered. The patient agreed with the plan and demonstrated an understanding of the instructions.   The patient was advised to call back or seek an in-person evaluation if the symptoms worsen or if the condition fails to improve as anticipated.  I provided 20 minutes of non face-to-face telephone visit time during this encounter, and > 50% was dedicated to reviewing his disease status, discussing treatment options and answering questions regarding future plan of care.  Zola Button, MD 10/03/2019 8:19 AM

## 2019-10-04 DIAGNOSIS — C61 Malignant neoplasm of prostate: Secondary | ICD-10-CM | POA: Diagnosis not present

## 2019-10-08 ENCOUNTER — Encounter (HOSPITAL_COMMUNITY): Payer: Self-pay | Admitting: Oncology

## 2019-10-19 ENCOUNTER — Other Ambulatory Visit: Payer: Self-pay | Admitting: Oncology

## 2019-10-24 ENCOUNTER — Encounter: Payer: Self-pay | Admitting: Urology

## 2019-10-24 ENCOUNTER — Ambulatory Visit
Admission: RE | Admit: 2019-10-24 | Discharge: 2019-10-24 | Disposition: A | Discharge status: Home / Self Care | Payer: Medicare Other | Source: Ambulatory Visit | Attending: Urology | Admitting: Urology

## 2019-10-24 ENCOUNTER — Other Ambulatory Visit: Payer: Self-pay

## 2019-10-24 DIAGNOSIS — C61 Malignant neoplasm of prostate: Secondary | ICD-10-CM

## 2019-10-24 DIAGNOSIS — C7951 Secondary malignant neoplasm of bone: Secondary | ICD-10-CM

## 2019-10-24 NOTE — Progress Notes (Signed)
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  Interval Since Last Radiation:  1 month 09/09/19 - 09/19/19:  Patient received 9 of 10 planned fractions of palliative XRT to the progressive disease at L5 (27 Gy).  09/17/19 - 09/19/19:  Patient received 3 of 10 planned fractions of palliative XRT to the progressive AP nodes in the chest (12 Gy)  05/10/19//SRS: The targeted metastasis in the L4 vertebral body was treated to a prescription dose of 18 Gy in 1 fraction.  10/24/18: The targeted metastasis in theL1 and L3vertebral body were eachtreatedto a prescription dose of 18Gy in 1 fraction.   09/04/2018 - 09/17/2018:The lumbar spinetarget at L3-L4was treated to 30 Gy in 10 fractions of 3 Gy.  11/05/2015 - 12/10/2015: 45 Gy in 25 fractions to the pelvis followed by LDR boost, 110 Gy on 12/23/2015 at [facility] c/o Dr. Daleen Squibb.  Narrative:  I spoke with the patient to conduct his routine scheduled 1 month follow up visit via telephone to spare the patient unnecessary potential exposure in the healthcare setting during the current COVID-19 pandemic.  The patient was notified in advance and gave permission to proceed with this visit format.             In summary, he was initially diagnosed with Gleason 5+4 adenocarcinoma of the prostate with a PSA of 13.66 in July 2016 under the care of Dr. Darvin Neighbours. He elected to proceed with a 5-week course of external beam radiotherapy followed by brachytherapy boost at [facility] with Dr. Carolin Sicks between 11/05/15 - 12/2015 in combination with 2 years of androgen deprivation therapy. PSA nadired at 1.0 in 07/2016. He developed  castration resistant prostate cancer in February 2018 when his PSA was noted to be elevated at 7.23 despite castrate level testoterone at 10 ng/dL. Metastatic work-up at that time showed bone involvement as well as lymphadenopathy. He was treated under the care of Dr. Renato Gails at [facility], initially with enzalutamide, which was started in May 2018 and discontinued in January 2019 when he presented with progression of disease with increased bony metastasis, adrenal nodule and worsening lymphadenopathy. At that time, his treatment was changed to Taxotere chemotherapy with his last treatment in July 2019- discontinued due to disease progression noted on repeat systemic imaging which showed a partial response with slight improvement in the bulky retroperitoneal lymphadenopathy but worsening bony metastasis based on a lumbar spine MRI obtained on June 24, 2018. The MRI showed a complete replacement of L3 vertebral body with tumor in the ventral epidural space behind L3, greater to the left. He elected to transfer his care to Dr. Clelia Croft at the [facility] since this is closer to home and has continued on androgen deprivation therapy with Lupron under the care of Dr. Earlene Plater with his last 30-month injection given in2/2020. He has also been receiving denosumab (Xgeva)every 6 weeks and Zytigawas added to his treatment regimen on 08/28/2018.          Due to progressive low back pain, he elected to proceed with surgical decompressive laminectomy on the left at L3 with complete removal of the left L3 lamina, microscopic foraminotomies of the left  L3 and L4 nerve roots and microscopic removal of epidural tumor on the left under the care of Dr. Wynetta Emery on 08/06/2018. Final surgical pathology confirmed high-grade metastatic prostate adenocarcinoma. He completed postoperative palliative radiotherapy to the lumbar spine at the levels of L3-L4on 09/17/18.   He did note improvement in his back pain following treatment  but continued to struggle with weakness and limitations in mobility. Over time, his pain became progressively severe resulting in a visit to the emergency department on 10/16/2018 due to intractable pain radiating into the lower extremities with progressive weakness.  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 He was started on IV dexamethasone with significant improvement in his symptoms. He was not felt to be a good candidate for further surgical intervention and therefore elected to proceed with an additional course of palliative SRS to the progressive/new sites of disease in the lumbar spine at L1 and L3 which was completed on 10/24/2018. He tolerated the treatment well and reported significant improvement in his pain following treatment.  His initial post-treatment MRI on 01/25/19 showed an excellent response to treatment with decreased ventral epidural tumor and overall decreased spinal stenosis. There was mild enlargement of the L1 metastasis which was felt most likely treatment related but also noted increased iliac chain lymphadenopathy. He continued on Lupron ADT q 6 months in combination with daily Zytiga. He continued taking Zytiga 1000 mg daily since September 2019 with Dr. Clelia Croft as well as Lupron every 6 months for ADT under the care and direction of Dr. Earlene Plater (last injection 01/2019), his urologist.  Unfortunately, his PSA continued to increase despite treatment with a PSA of 27.6 on 12/25/2018, 51 in 02/2019, and 115 on 04/23/19.  He had a follow-up visit with his medical oncologist, Dr. Clelia Croft on 04/25/2019 and the recommendation was to discontinue his Zytiga and switch to  systemic chemotherapy with [name] in addition to his Lupron ADT.  He had a port-a-cath placed and started his systemic chemotherapy with [name] on Friday 05/10/19.  A follow upMRI lumbar spine from 04/26/19 showed stable treated disease at L1 and L3 and a stable benign superior endplate compression fracture at L5 but there was a new metastatic lesion within the anterior L4 vertebral body measuring up to 2.5 cm in diameter and considerable progression of retroperitoneal lymphadenopathy since prior scan in February 2020.   He reported mildly progressive low back pain for 1-2 months but denied any new associated neurologic symptoms such as focal weakness in the lower extremities or paraesthesias.  The decision was to proceed with SRS to the new metastasis at L4 which was completed in a single fraction on 05/10/19 and tolerated well.  His PSA was 99.2 on 05/30/19, decreased from 129 on 05/10/19 which was encouraging.  Unfortunately, he required hospital admission on 05/17/19 for left sided chest/back pain and was found to have a small volume PE.  He will was initially treated with Lovenox injections but has since transitioned to oral Xarelto.  He normally receives his 33-month Lupron injections with Dr. Earlene Plater but unfortunately he was recently informed that the medication was on back order and they did not currently have this in stock at their  office.  Therefore, he is coming to see Dr. Clelia Croft on 09/04/2019 at 3 PM to receive his 6 month Lupron injection.   He is status post 6 cycles of [name] chemotherapy with modest PSA response initially but his PSA currently is rising.  Restaging CT C/A/P obtained on 08/20/2019 showed progression of disease including his hepatic metastasis as well as diffuse lymphadenopathy in the chest, abdomen and pelvis.  He had ultrasound-guided biopsy of the liver metastasis on 08/29/2019 with final pathology confirming metastatic adenocarcinoma consistent with high-grade, metastatic prostate  adenocarcinoma.  He continued with persistent low back pain into his legs bilaterally when standing for any period of time as well as progressive weakness in the lower extremities which began significantly impacting his functional status.  He had a repeat MRI lumbar spine on 08/27/19 which showed progressive metastatic disease at L5 with increased pathologic compression of the vertebral body but no neural impingement.  His imaging and case were reviewed and discussed at the multidisciplinary spine tumor board on 09/02/19 and consensus was that his previously treated sites of disease in the lumbar spine appeared stable but there was definite progressive disease at L5 as well as in the paraspinal soft tissues involving the left adrenal gland, periaortic lymph nodes, bilateral iliac chain lymph nodes and progressive extension into the psoas muscles bilaterally. The recommendation was to proceed with a 2 week course of palliative radiation directed to the progressive disease at L5 delivered in 10 daily treatments which the patient agreed to.     Interval History:   He was complaining of hoarseness of voice and hacking cough during his recent course of radiation so he had CT SIM/planning and started a palliative course of treatment to his chest/AP window lymphadenopathy in addition to the radiation he was receiving to the lumbar spine.  Unfortunately, his radiation treatments were discontinued prior to completion due to significant fatigue/weakness/failure to thrive requiring hospitalization from 09/19/19-09/24/19.  During his hospitalization, he and his family made the decision to transition to Hospice care and discontinue all radiation and systemic treatment.  On review of systems, the patient states that he is doing relatively well overall.  His back pain is pretty well controlled with Fentanyl patches. He continues to be very weak and spends the majority of his days in the bed. He denies any new paresthesias. His  appetite is very poor and he is eating and drinking very little these days. He denies abdominal pain, nausea, vomiting, or diarrhea.  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  He reports that this is unchanged recently and he manages the constipation with colace stool softeners daily Miralax.  ALLERGIES:  is allergic to statins.  Meds: Current Outpatient Medications  Medication Sig Dispense Refill   acetaminophen (TYLENOL) 500 MG tablet Take 500 mg by mouth daily. Pt takes 5 days after chemo     antiseptic oral rinse (BIOTENE) LIQD 15 mLs by Mouth Rinse route as needed for dry mouth.     Calcium Carb-Cholecalciferol (CALCIUM 1000 + D PO) Take 30 mLs by mouth 3 (three) times a week. Calcium 1000 mg / Vit D 1000 iu      cycloSPORINE (RESTASIS) 0.05 % ophthalmic emulsion Place 1 drop into both eyes 2 (two) times daily.     dexamethasone (DECADRON) 0.5 MG/5ML solution Take 40 mLs (4 mg total) by mouth 2 (two) times daily. 2400 mL 0   docusate sodium (COLACE) 100 MG capsule Take 100  mg by mouth 2 (two) times daily.     ezetimibe (ZETIA) 10 MG tablet Take 10 mg by mouth daily.      fentaNYL (DURAGESIC) 25 MCG/HR Place 1 patch onto the skin every 3 (three) days. 10 patch 0   ibuprofen (ADVIL,MOTRIN) 200 MG tablet Take 200 mg by mouth every 6 (six) hours as needed for moderate pain.     leuprolide, 6 Month, (ELIGARD) 45 MG injection Inject 45 mg into the skin every 6 (six) months.     lidocaine-prilocaine (EMLA) cream Apply 1 application topically as needed. 30 g 0   Loratadine (CLARITIN) 10 MG CAPS Take 10 mg by mouth daily. Patient takes every day for 5 days after chemo treatment.     megestrol (MEGACE) 400 MG/10ML suspension Take 10 mLs (400 mg total) by mouth 2 (two) times daily. 240 mL 0   mirtazapine (REMERON) 7.5 MG tablet Take 1 tablet (7.5 mg total) by mouth at bedtime. 30 tablet 0   ondansetron (ZOFRAN) 4 MG  tablet Take 4 mg by mouth every 8 (eight) hours as needed for nausea or vomiting.     oxyCODONE (OXY IR/ROXICODONE) 5 MG immediate release tablet Take 1-2 tablets (5-10 mg total) by mouth every 4 (four) hours as needed for severe pain. 90 tablet 0   pantoprazole sodium (PROTONIX) 40 mg/20 mL PACK Take 20 mLs (40 mg total) by mouth 2 (two) times daily. 1200 mL 0   polyethylene glycol (MIRALAX / GLYCOLAX) packet Take 17 g by mouth daily as needed for mild constipation. 14 each 0   predniSONE (DELTASONE) 5 MG tablet TAKE 1 TABLET BY MOUTH 2 TIMES DAILY WITH A MEAL. 60 tablet 2   prochlorperazine (COMPAZINE) 10 MG tablet Take 1 tablet (10 mg total) by mouth every 6 (six) hours as needed for nausea or vomiting. 30 tablet 0   promethazine (PHENERGAN) 25 MG suppository Place 1 suppository (25 mg total) rectally every 6 (six) hours as needed for nausea. 36 suppository 2   tamsulosin (FLOMAX) 0.4 MG CAPS capsule Take 0.4 mg by mouth daily.      traMADol (ULTRAM) 50 MG tablet TAKE 1 TABLET BY MOUTH EVERY 6 HOURS AS NEEDED FOR PAIN (Patient taking differently: Take 50 mg by mouth every 6 (six) hours as needed for moderate pain. ) 60 tablet 0   triamcinolone cream (KENALOG) 0.1 % Apply 1 application topically daily as needed (dry skin in the winter).      XARELTO 20 MG TABS tablet TAKE 1 TABLET (20 MG TOTAL) BY MOUTH DAILY WITH SUPPER FOR 30 DAYS. (Patient taking differently: Take 20 mg by mouth daily with supper. ) 90 tablet 1   No current facility-administered medications for this encounter.     Physical Findings:  vitals were not taken for this visit.  Pain Assessment Pain Score: 7 /Unable to assess due to telephone follow-up visit format.  Lab Findings: Lab Results  Component Value Date   WBC 11.9 (H) 09/23/2019   HGB 8.1 (L) 09/23/2019   HCT 26.3 (L) 09/23/2019   MCV 94.6 09/23/2019   PLT 120 (L) 09/23/2019     Radiographic Findings: No results found.  Impression/Plan: 1. 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  He has recovered well from the effects of his recent radiotherapy.  He has now transitioned over to Hospice care so we will only see him back on an  as-needed basis at this point.  I let him know that he will remain in our thoughts and prayers and encouraged him and his family to call at any time with any questions or concerns regarding radiotherapy.  They are comfortable with and in agreement with the stated plan.  Given current concerns for patient exposure during the COVID-19 pandemic, this encounter was conducted via telephone. The patient was notified in advance and was offered a WebEX meeting to allow for face to face communication but unfortunately reported that he did not have the appropriate resources/technology to support such a visit and instead preferred to proceed with telephone follow up visit instead. The patient has given verbal consent for this type of encounter. The time spent during this encounter was 15 minutes. The attendants for this meeting include  Arren Laminack PA-C, patient, Sanjit Sciandra and his wife, Darel Hong. During the encounter, Eyan Hagood PA-C, was located at [facility] Radiation Oncology Department.  Patient,  [name] and his wife, Darel Hong were located at home.   Marguarite Arbour, PA-C

## 2019-11-01 NOTE — Progress Notes (Signed)
  Radiation Oncology         [phone] ________________________________  Name: [name] MRN: [id number]  Date: 09/19/2019  DOB: February 10, 1941  End of Treatment Note  Diagnosis:   78 y.o. 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     Indication for treatment:  Palliative       Radiation treatment dates:    1. 09/09/2019 - 09/19/2019: L5 / received 27 Gy out of 30 Gy planned 2. 09/17/2019 - 09/19/2019: AP nodes / received 12 Gy out of 30 Gy planned  Beams/energy:    1. IMRT, photons / 6X 2. Complex Isodose, photons / 10X, 15X, 6X  Narrative: The patient tolerated radiation treatment relatively well. He denied any pain or numbness/tingling but did experiecnce mild fatigue and swelling in both legs.   Plan: Unfortunately, his radiation treatments were discontinued prior to completion due to significant fatigue/weakness/failure to thrive requiring hospitalization from 09/19/19-09/24/19.  During his hospitalization, he and his family made the decision to transition to Hospice care and discontinue all radiation and systemic treatment. He will continue his care under the direction of Hospice and Dr. [name]. ________________________________  Sheral Apley. Tammi Klippel, M.D.   This document serves as a record of services personally performed by Tyler Pita, MD. It was created on his behalf by Wilburn Mylar, a trained medical scribe. The creation of this record is based on the scribe's personal observations and the provider's statements to them. This document has been checked and approved by the attending provider

## 2019-11-19 DEATH — deceased

## 2020-01-19 IMAGING — DX PORTABLE CHEST - 1 VIEW
1 series · 1 of 1 positions shown · non-contrast
Comparison: None.

CLINICAL DATA: Fever

EXAM:
PORTABLE CHEST 1 VIEW

[chest ap]
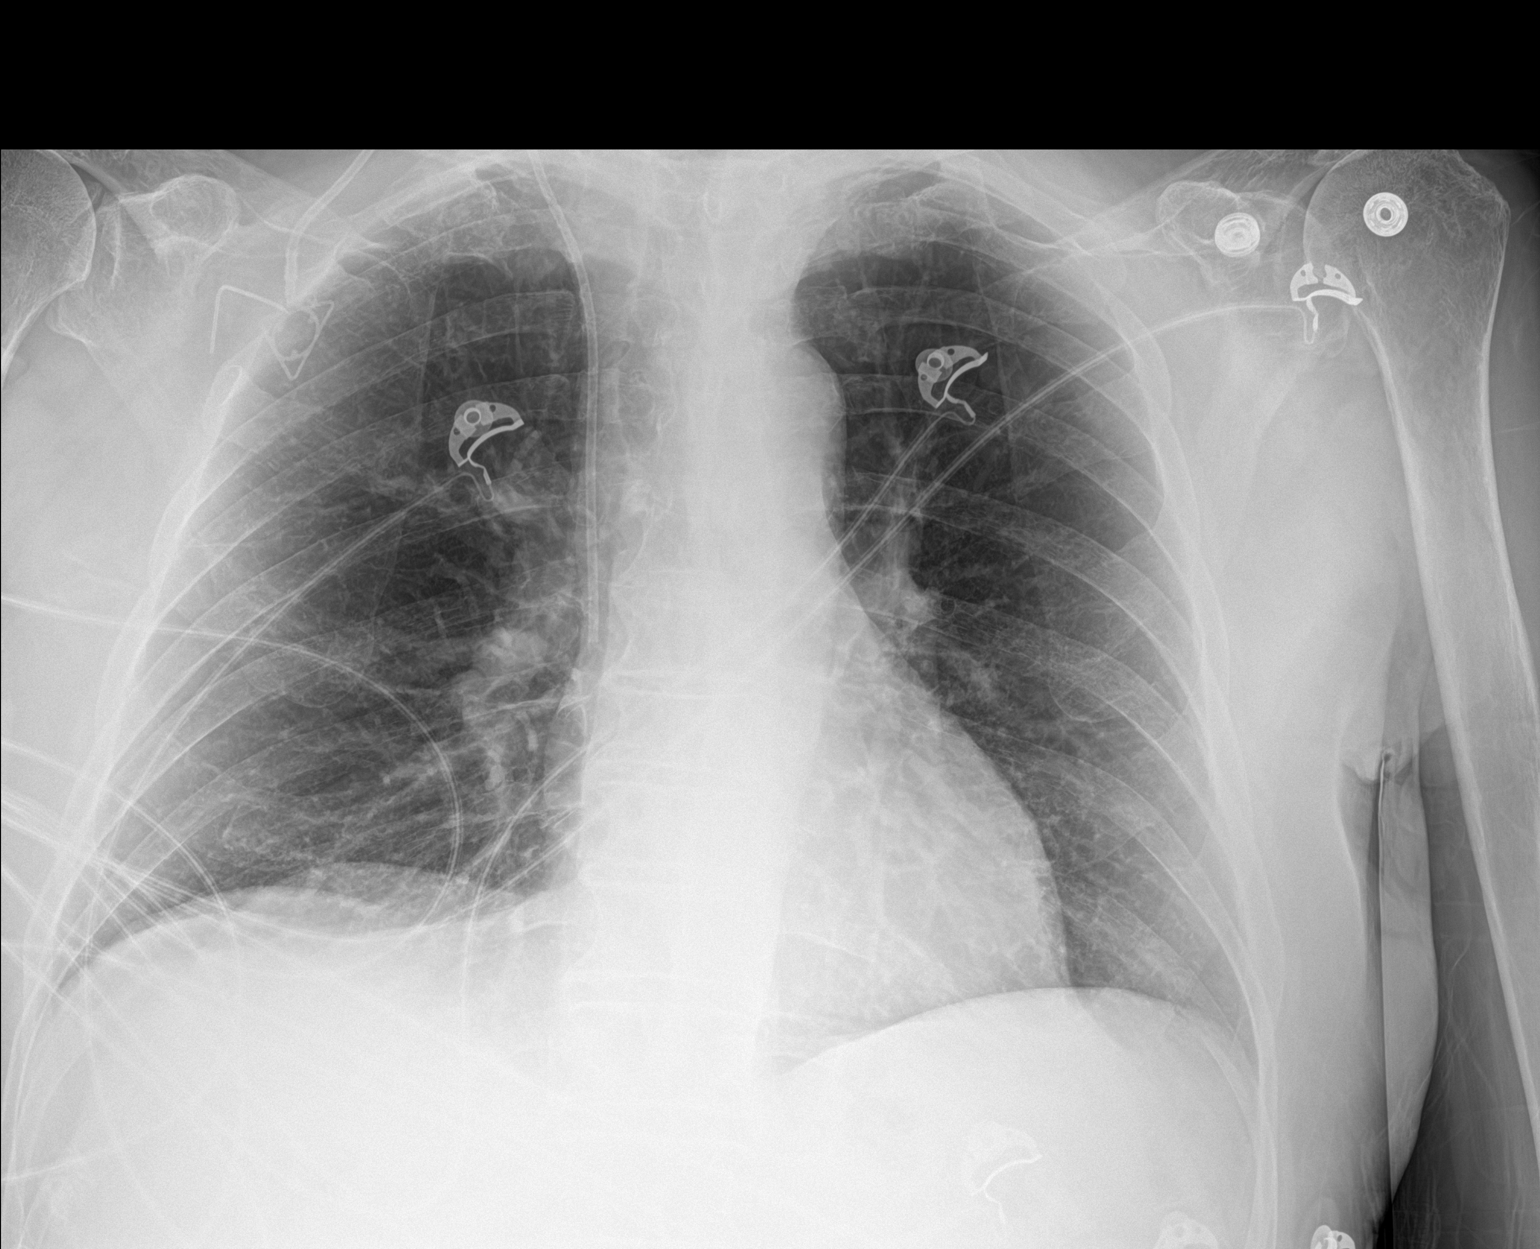

[1 of 1 positions shown; findings below may reference images not displayed]

FINDINGS: The heart size and mediastinal contours are within normal limits.
Both lungs are clear. The visualized skeletal structures are
unremarkable. There is a right chest wall power-injectable
Port-A-Cath with tip in the proximal right atrium via a right
internal jugular vein approach.
IMPRESSION: No active disease.

## 2020-01-23 ENCOUNTER — Encounter: Payer: Self-pay | Admitting: Radiation Oncology

## 2020-04-23 IMAGING — CT CT ABD-PELV W/ CM
2 of 5 series · 12 of 36 positions shown, 15 images · IV contrast (omnipaque)
Comparison: 04/23/2019.

CLINICAL DATA: Restaging metastatic prostate cancer

EXAM:
CT CHEST, ABDOMEN, AND PELVIS WITH CONTRAST
TECHNIQUE: Multidetector CT imaging of the chest, abdomen and pelvis was
performed following the standard protocol during bolus
administration of intravenous contrast.
CONTRAST:  100mL OMNIPAQUE IOHEXOL 300 MG/ML  SOLN

[Series 2: cap with · axial · 0.78mm/px · z∈[-613,-73]mm · 9 of 134 slices shown, 12 images]
[im 13/134  mediastinal]
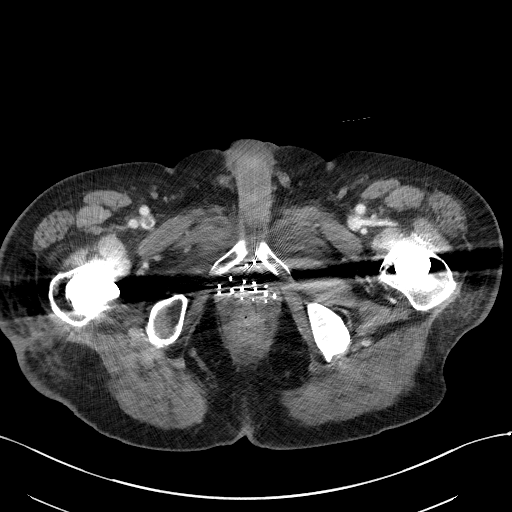
[im 13/134  lung]
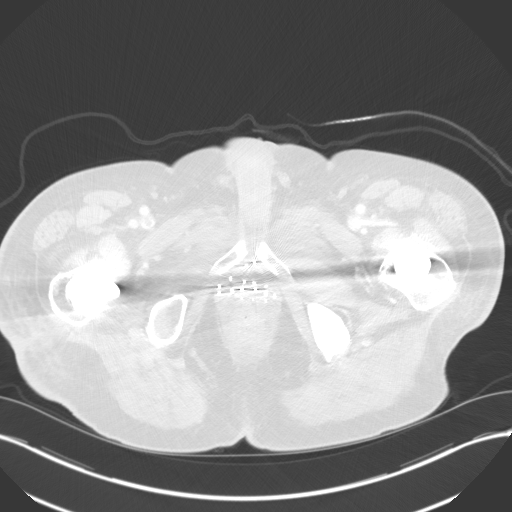
[im 25/134  lung]
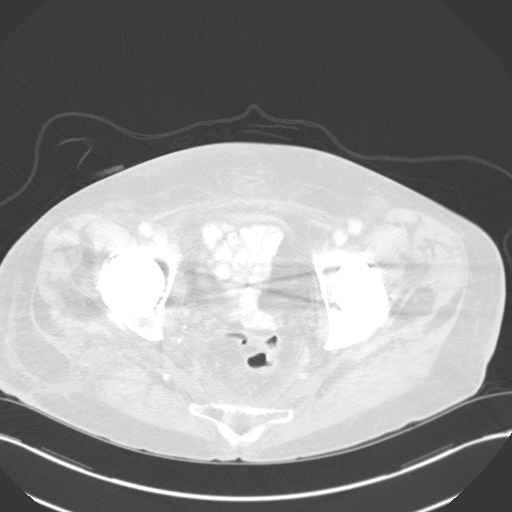
[im 37/134  lung]
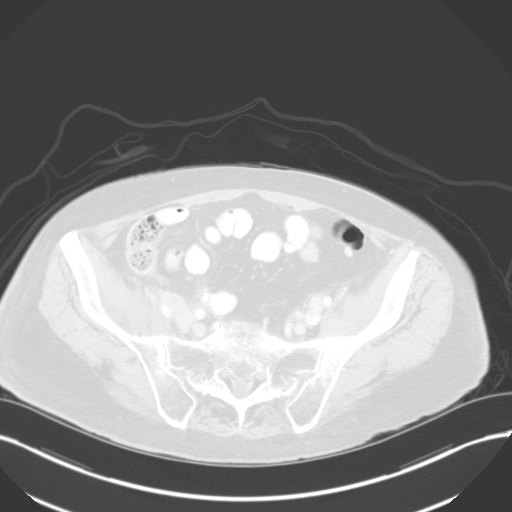
[im 49/134  lung]
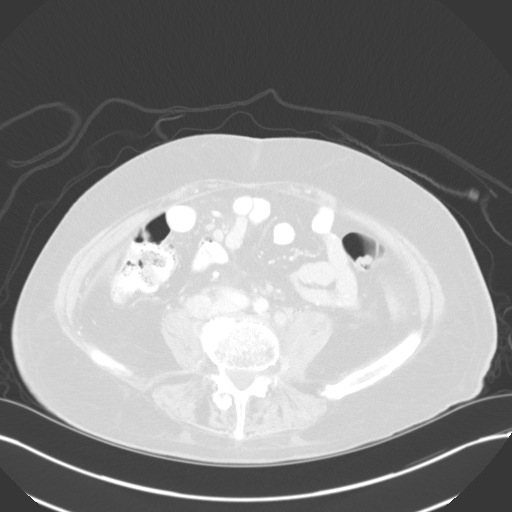
[im 73/134  mediastinal]
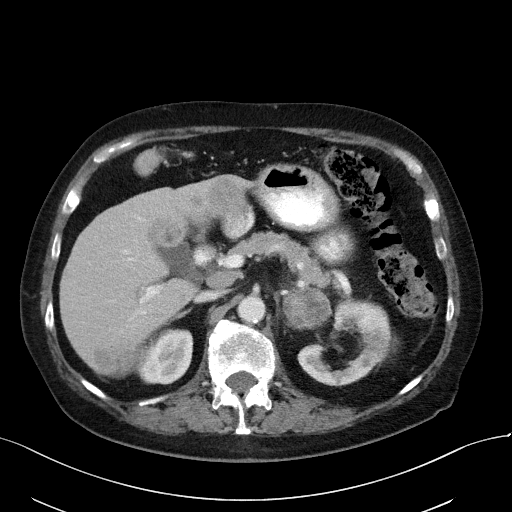
[im 73/134  lung]
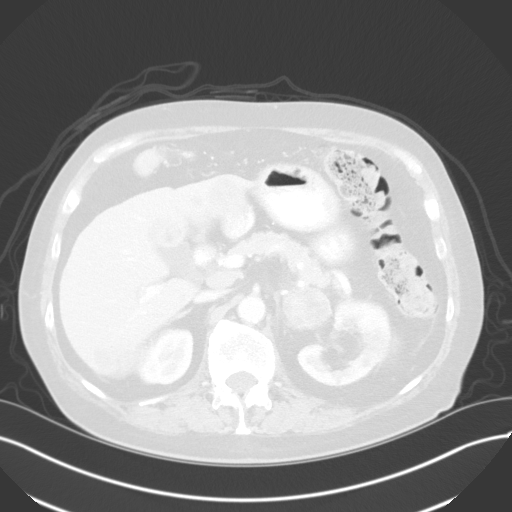
[im 85/134  lung]
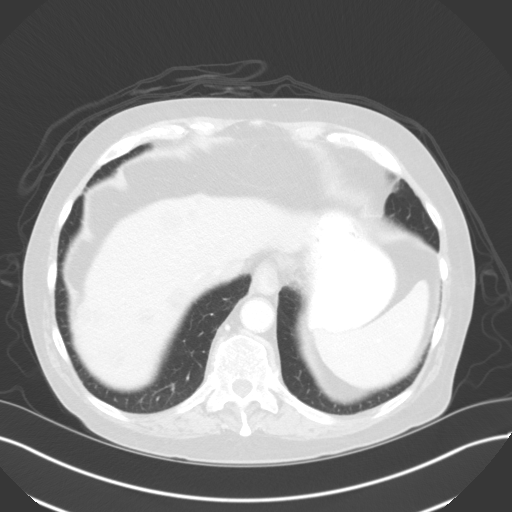
[im 97/134  lung]
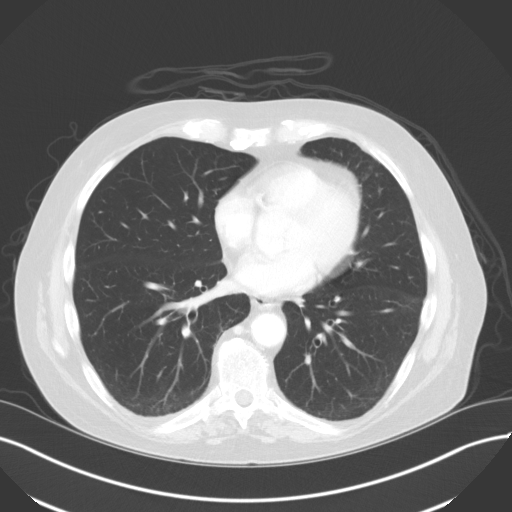
[im 109/134  lung]
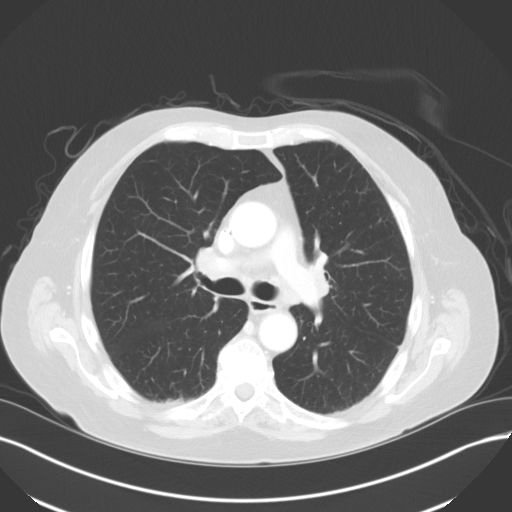
[im 121/134  mediastinal]
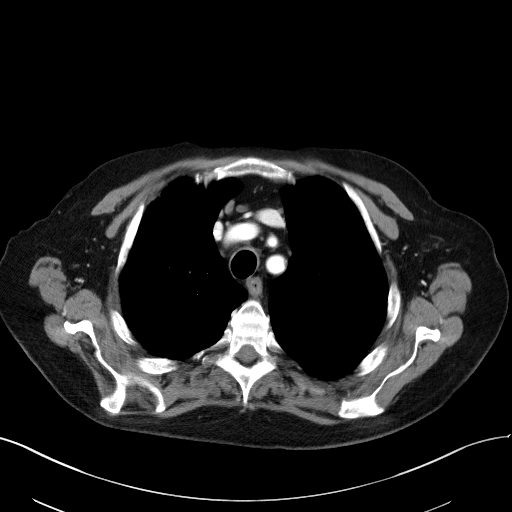
[im 121/134  lung]
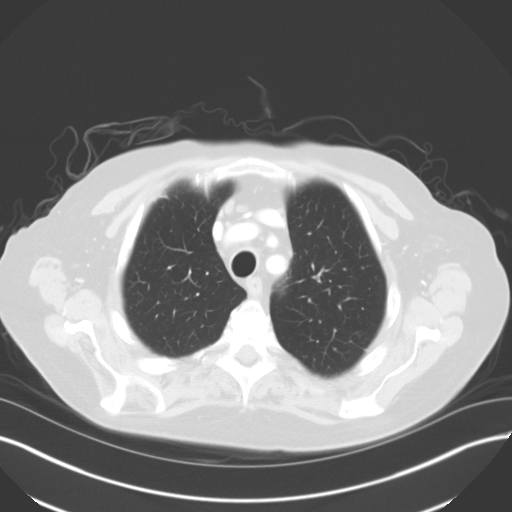

[Series 6: coronals · coronal · 0.82mm/px · 3 of 165 slices shown]
[im 33/165  lung]
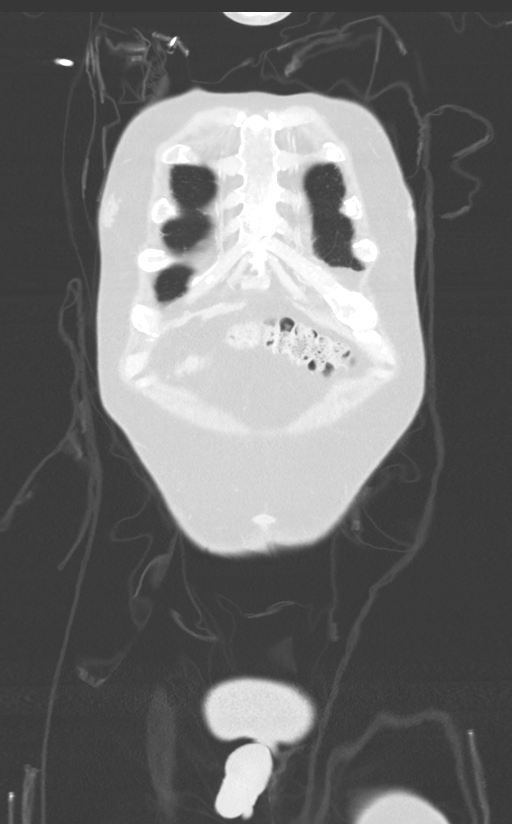
[im 66/165  lung]
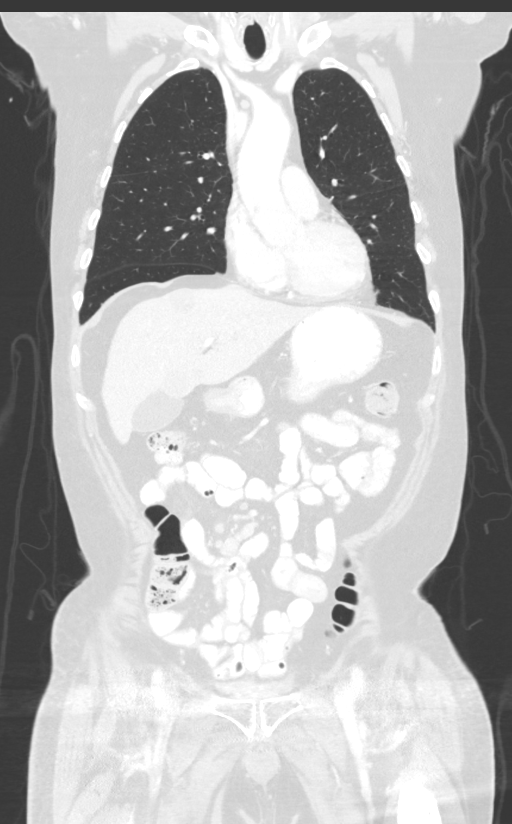
[im 99/165  lung]
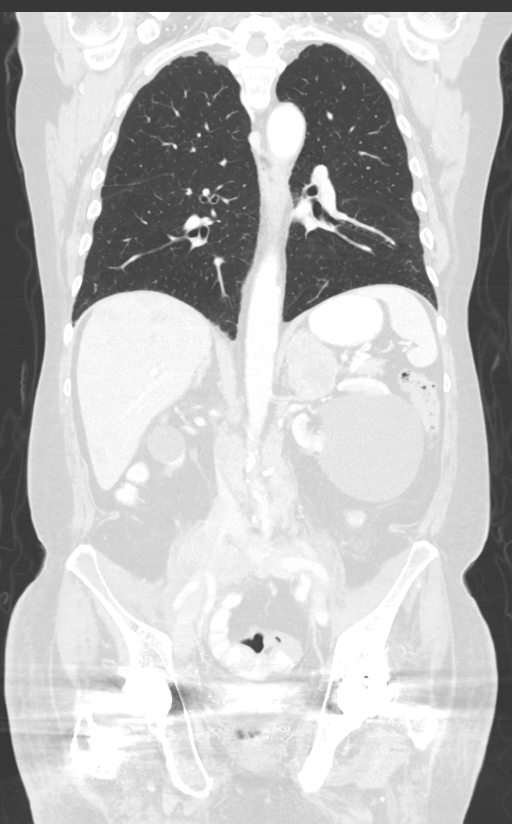

[12 of 36 positions shown; findings below may reference images not displayed]

FINDINGS: CT CHEST FINDINGS

Cardiovascular: Normal heart size. No pericardial effusion. There is
tumor encasement of the left common carotid artery with luminal
narrowing of this vessel. This is a new finding compared with
07/23/2018.

Mediastinum/Nodes: Normal appearance of the thyroid gland. The
trachea appears patent and is midline. Normal appearance of the
esophagus.

Left supraclavicular nodal mass measures 3.6 x 2.7 cm, image [DATE].
This encases the left common carotid artery as before. On the
previous exam this measured 3.1 x 2.0 cm. Bilateral mediastinal
adenopathy is identified, increased from previous exam. Pre-vascular
lymph node measures 1.5 cm, image [DATE]. Previously 0.5 cm. Right
paratracheal lymph node measures 1.1 cm, image [DATE]. Previously
cm. Left paratracheal lymph node measures 1.7 cm, image [DATE].
Previously 0.6 cm. Right hilar lymph node measures 1.9 cm, image
[DATE]. Previously 0.9 cm.

Lungs/Pleura: No pleural effusion identified. Subsolid nodule within
the anterior right upper lobe measures 9 mm, image 38/4. Previously
1.3 cm. No new or enlarging nodule.

Musculoskeletal: No chest wall mass or suspicious bone lesions
identified.

CT ABDOMEN PELVIS FINDINGS

Hepatobiliary: Multiple low-attenuation liver lesions are identified
six. These are increased in number from previous exam.

Previous index lesion within posterior right lobe measures 6.3 cm,
image 56/2. Previously 6.1 cm. Multiple, new liver lesions are noted
which are too numerous to count including lateral segment of left
lobe of liver lesion measuring 3 cm, image 61/2. Anterior dome of
liver lesion measures 2.3 cm, image 49/2. Medial segment left lobe
of liver lesion measures 2.3 cm, image 58/2. Gallbladder is normal.
No biliary dilatation.

Pancreas: Unremarkable. No pancreatic ductal dilatation or
surrounding inflammatory changes.

Spleen: Normal in size without focal abnormality.

Adrenals/Urinary Tract: Normal right adrenal gland. Mass in the left
adrenal gland measures 6 cm, image 56/2. Unchanged from previous
exam.

Bilateral kidney cysts. No mass or hydronephrosis. The urinary
bladder is largely obscured by beam hardening artifact from
patient's bilateral hip arthroplasty devices.

Stomach/Bowel: Stomach is within normal limits. Appendix appears
normal. No evidence of bowel wall thickening, distention, or
inflammatory changes.

Vascular/Lymphatic: Aortic atherosclerosis. No aneurysm. Extensive
abdominopelvic adenopathy is again noted. Index left retroperitoneal
node measures 1.4 cm, image 72/2. Previously 1 cm. Index left retro
caval lymph node measures 1.8 cm, image 73/2. Previously 1.1 cm.
Index right common iliac adenopathy measures 3.2 cm, image 89/2.
Previously 3.6 cm.

Right pelvic sidewall lymph node measures 2 cm, image 107/2.
Previously 1.7 cm.

Reproductive: Seed implants noted within the prostate gland.

Other: No free fluid or fluid collections.

Musculoskeletal: Similar appearance of compression deformities
involving L3 and L5 vertebra. There is mild retropulsion of fracture
fragments at these levels. Sclerotic metastasis involving the left
ischial ramus and left inferior pubic rami is similar to previous
exam.
IMPRESSION: 1. Interval progression of disease. Increase in size of left
supraclavicular, mediastinal and right hilar nodal metastasis.
2. Tumor encasement of the left common carotid artery with luminal
narrowing is identified. This is a new finding when compared with
07/23/2018. Compared with 04/23/2019 the left supraclavicular nodal
mass has increased in size with progressive narrowing of the vessel.
3. Interval progression of multifocal liver metastasis.
4. Increase in nodal metastasis within the abdomen and pelvis.
5. Similar appearance of sclerotic metastasis involving the left
ischial ramus.
6. Stable appearance of L3 and L5 compression fractures.

## 2020-05-23 IMAGING — CR DG CHEST 2V
2 series · 2 of 2 positions shown · non-contrast
Comparison: CT 08/20/2019, radiograph 05/17/2019

CLINICAL DATA: Prostate cancer on chemo radiation, acute onset
shortness of breath

EXAM:
CHEST - 2 VIEW

[chest lat]
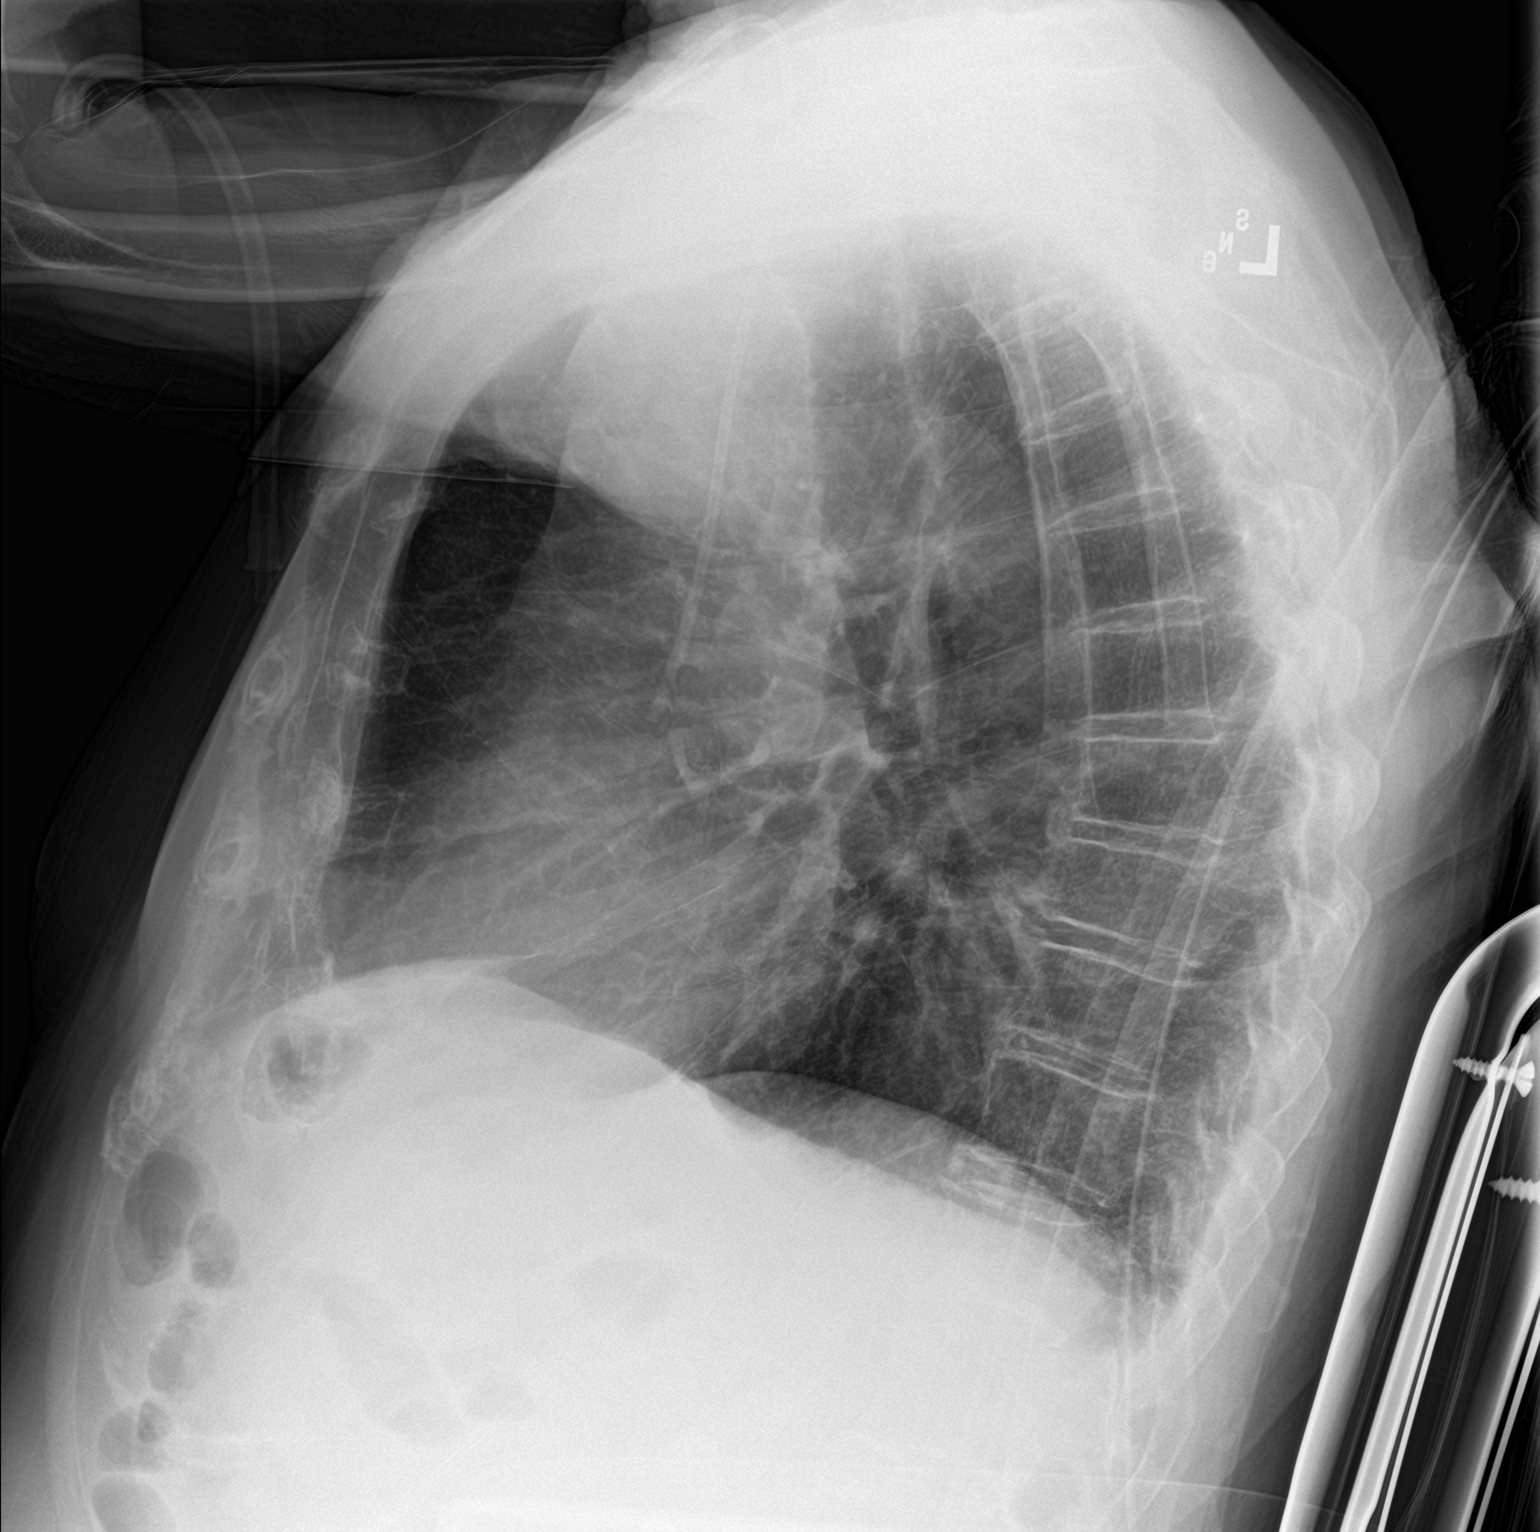

[chest ap]
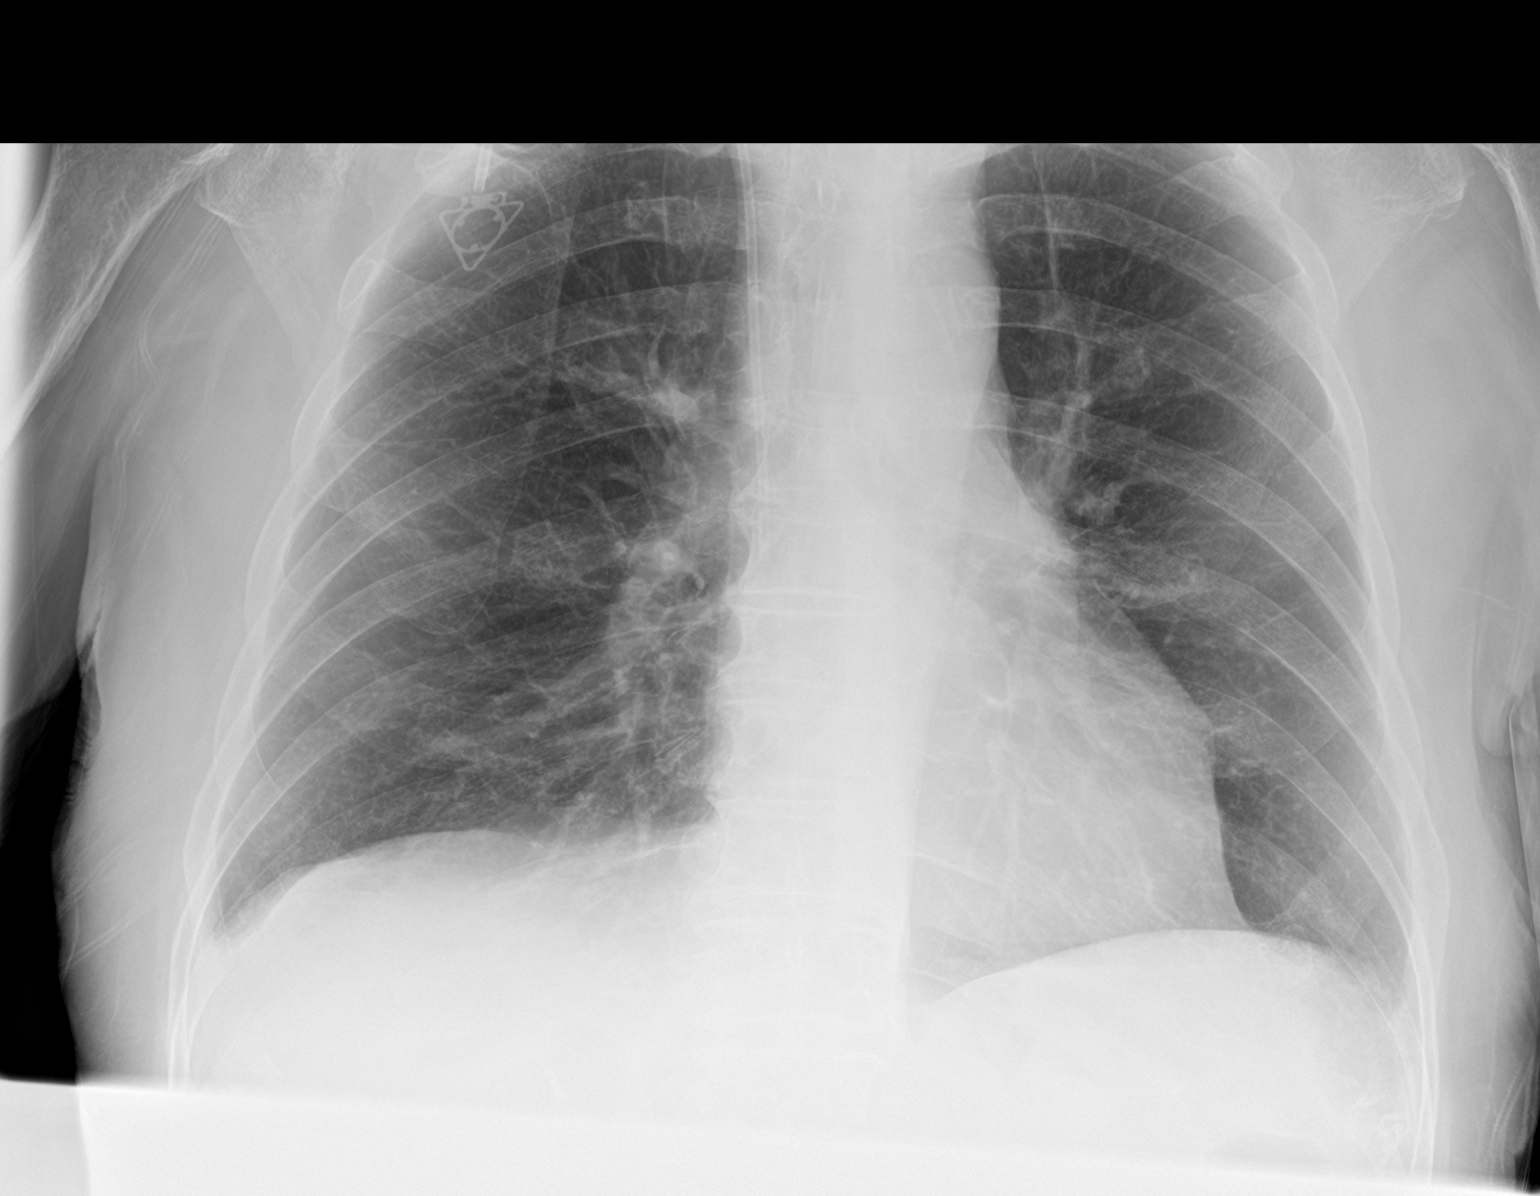

[2 of 2 positions shown; findings below may reference images not displayed]

FINDINGS: Right IJ approach Port-A-Cath tip terminates near the superior
cavoatrial junction. Stable bandlike areas of scarring with basilar
areas of atelectasis. No consolidation, features of edema,
pneumothorax, or effusion. The cardiomediastinal contours are
unremarkable. Degenerative changes are present in the imaged spine
and shoulders.
IMPRESSION: Bandlike areas of stable scarring. No acute cardiopulmonary disease.
